# Patient Record
Sex: Female | Born: 1940
Health system: Southern US, Community
[De-identification: ages and names within clinical notes are randomized; demographics above are authoritative.]

## PROBLEM LIST (undated history)

## (undated) DIAGNOSIS — Z923 Personal history of irradiation: Secondary | ICD-10-CM

## (undated) DIAGNOSIS — M1990 Unspecified osteoarthritis, unspecified site: Secondary | ICD-10-CM

## (undated) DIAGNOSIS — Z9889 Other specified postprocedural states: Secondary | ICD-10-CM

## (undated) DIAGNOSIS — E079 Disorder of thyroid, unspecified: Secondary | ICD-10-CM

## (undated) DIAGNOSIS — R7303 Prediabetes: Secondary | ICD-10-CM

## (undated) DIAGNOSIS — E039 Hypothyroidism, unspecified: Secondary | ICD-10-CM

## (undated) DIAGNOSIS — H269 Unspecified cataract: Secondary | ICD-10-CM

## (undated) DIAGNOSIS — H9319 Tinnitus, unspecified ear: Secondary | ICD-10-CM

## (undated) DIAGNOSIS — C801 Malignant (primary) neoplasm, unspecified: Secondary | ICD-10-CM

## (undated) DIAGNOSIS — R112 Nausea with vomiting, unspecified: Secondary | ICD-10-CM

## (undated) DIAGNOSIS — Z9221 Personal history of antineoplastic chemotherapy: Secondary | ICD-10-CM

## (undated) DIAGNOSIS — T8859XA Other complications of anesthesia, initial encounter: Secondary | ICD-10-CM

## (undated) DIAGNOSIS — E785 Hyperlipidemia, unspecified: Secondary | ICD-10-CM

## (undated) DIAGNOSIS — F419 Anxiety disorder, unspecified: Secondary | ICD-10-CM

## (undated) DIAGNOSIS — G4730 Sleep apnea, unspecified: Secondary | ICD-10-CM

## (undated) HISTORY — DX: Unspecified cataract: H26.9

## (undated) HISTORY — PX: TONSILLECTOMY: SUR1361

## (undated) HISTORY — PX: OTHER SURGICAL HISTORY: SHX169

## (undated) HISTORY — DX: Malignant (primary) neoplasm, unspecified: C80.1

## (undated) HISTORY — DX: Disorder of thyroid, unspecified: E07.9

## (undated) HISTORY — DX: Hyperlipidemia, unspecified: E78.5

---

## 2003-09-29 DIAGNOSIS — C801 Malignant (primary) neoplasm, unspecified: Secondary | ICD-10-CM

## 2003-09-29 HISTORY — DX: Malignant (primary) neoplasm, unspecified: C80.1

## 2003-09-29 HISTORY — PX: BREAST BIOPSY: SHX20

## 2003-09-29 HISTORY — PX: BREAST LUMPECTOMY: SHX2

## 2003-11-15 ENCOUNTER — Other Ambulatory Visit: Payer: Self-pay

## 2004-04-03 ENCOUNTER — Ambulatory Visit (HOSPITAL_COMMUNITY): Admission: RE | Admit: 2004-04-03 | Discharge: 2004-04-03 | Payer: Self-pay | Admitting: Hematology & Oncology

## 2004-06-28 ENCOUNTER — Ambulatory Visit: Payer: Self-pay | Admitting: Internal Medicine

## 2004-07-29 ENCOUNTER — Ambulatory Visit: Payer: Self-pay | Admitting: Internal Medicine

## 2004-08-06 ENCOUNTER — Ambulatory Visit: Payer: Self-pay | Admitting: General Surgery

## 2004-08-13 ENCOUNTER — Ambulatory Visit: Payer: Self-pay | Admitting: Hematology & Oncology

## 2004-08-28 ENCOUNTER — Ambulatory Visit: Payer: Self-pay | Admitting: Internal Medicine

## 2004-09-28 ENCOUNTER — Ambulatory Visit: Payer: Self-pay | Admitting: Internal Medicine

## 2004-10-09 ENCOUNTER — Ambulatory Visit: Payer: Self-pay | Admitting: Hematology & Oncology

## 2004-10-29 ENCOUNTER — Ambulatory Visit: Payer: Self-pay | Admitting: Internal Medicine

## 2005-01-19 ENCOUNTER — Ambulatory Visit: Payer: Self-pay | Admitting: Internal Medicine

## 2005-01-22 ENCOUNTER — Ambulatory Visit: Payer: Self-pay | Admitting: Internal Medicine

## 2005-01-27 ENCOUNTER — Ambulatory Visit: Payer: Self-pay | Admitting: Hematology & Oncology

## 2005-01-28 ENCOUNTER — Ambulatory Visit: Payer: Self-pay | Admitting: Hematology & Oncology

## 2005-05-06 ENCOUNTER — Ambulatory Visit: Payer: Self-pay | Admitting: Hematology & Oncology

## 2005-07-27 ENCOUNTER — Ambulatory Visit: Payer: Self-pay | Admitting: Internal Medicine

## 2005-08-05 ENCOUNTER — Ambulatory Visit: Payer: Self-pay | Admitting: Hematology & Oncology

## 2005-09-08 ENCOUNTER — Ambulatory Visit: Payer: Self-pay | Admitting: Internal Medicine

## 2005-11-03 ENCOUNTER — Encounter: Payer: Self-pay | Admitting: Internal Medicine

## 2005-11-26 ENCOUNTER — Ambulatory Visit: Payer: Self-pay | Admitting: Hematology & Oncology

## 2005-12-28 ENCOUNTER — Ambulatory Visit: Payer: Self-pay | Admitting: General Surgery

## 2006-03-23 ENCOUNTER — Ambulatory Visit: Payer: Self-pay | Admitting: Hematology & Oncology

## 2006-03-26 LAB — CBC WITH DIFFERENTIAL/PLATELET
Eosinophils Absolute: 0.2 10*3/uL (ref 0.0–0.5)
HCT: 40.1 % (ref 34.8–46.6)
HGB: 13.8 g/dL (ref 11.6–15.9)
LYMPH%: 34.7 % (ref 14.0–48.0)
MONO#: 0.6 10*3/uL (ref 0.1–0.9)
NEUT#: 3.7 10*3/uL (ref 1.5–6.5)
NEUT%: 53.6 % (ref 39.6–76.8)
Platelets: 223 10*3/uL (ref 145–400)
WBC: 6.9 10*3/uL (ref 3.9–10.0)

## 2006-03-26 LAB — COMPREHENSIVE METABOLIC PANEL
Alkaline Phosphatase: 67 U/L (ref 39–117)
CO2: 24 mEq/L (ref 19–32)
Creatinine, Ser: 0.79 mg/dL (ref 0.40–1.20)
Glucose, Bld: 86 mg/dL (ref 70–99)
Sodium: 139 mEq/L (ref 135–145)
Total Bilirubin: 0.7 mg/dL (ref 0.3–1.2)
Total Protein: 6.9 g/dL (ref 6.0–8.3)

## 2006-06-28 ENCOUNTER — Ambulatory Visit: Payer: Self-pay | Admitting: General Surgery

## 2006-07-14 ENCOUNTER — Ambulatory Visit: Payer: Self-pay | Admitting: Hematology & Oncology

## 2007-01-05 ENCOUNTER — Ambulatory Visit: Payer: Self-pay | Admitting: General Surgery

## 2007-01-10 ENCOUNTER — Ambulatory Visit: Payer: Self-pay | Admitting: Hematology & Oncology

## 2007-01-13 LAB — COMPREHENSIVE METABOLIC PANEL
Alkaline Phosphatase: 69 U/L (ref 39–117)
BUN: 13 mg/dL (ref 6–23)
CO2: 23 mEq/L (ref 19–32)
Creatinine, Ser: 0.99 mg/dL (ref 0.40–1.20)
Glucose, Bld: 128 mg/dL — ABNORMAL HIGH (ref 70–99)
Total Bilirubin: 0.5 mg/dL (ref 0.3–1.2)
Total Protein: 6.6 g/dL (ref 6.0–8.3)

## 2007-01-13 LAB — CBC WITH DIFFERENTIAL/PLATELET
Basophils Absolute: 0 10*3/uL (ref 0.0–0.1)
Eosinophils Absolute: 0.3 10*3/uL (ref 0.0–0.5)
HCT: 38.4 % (ref 34.8–46.6)
LYMPH%: 32.5 % (ref 14.0–48.0)
MCV: 93.1 fL (ref 81.0–101.0)
MONO#: 0.5 10*3/uL (ref 0.1–0.9)
MONO%: 7.2 % (ref 0.0–13.0)
NEUT#: 3.6 10*3/uL (ref 1.5–6.5)
NEUT%: 54.7 % (ref 39.6–76.8)
Platelets: 214 10*3/uL (ref 145–400)
WBC: 6.6 10*3/uL (ref 3.9–10.0)

## 2007-07-12 ENCOUNTER — Ambulatory Visit: Payer: Self-pay | Admitting: Hematology & Oncology

## 2007-07-14 LAB — COMPREHENSIVE METABOLIC PANEL
Albumin: 4.4 g/dL (ref 3.5–5.2)
BUN: 13 mg/dL (ref 6–23)
CO2: 21 mEq/L (ref 19–32)
Glucose, Bld: 107 mg/dL — ABNORMAL HIGH (ref 70–99)
Potassium: 4.3 mEq/L (ref 3.5–5.3)
Sodium: 140 mEq/L (ref 135–145)
Total Bilirubin: 0.6 mg/dL (ref 0.3–1.2)
Total Protein: 6.9 g/dL (ref 6.0–8.3)

## 2007-07-14 LAB — CBC WITH DIFFERENTIAL/PLATELET
Basophils Absolute: 0 10*3/uL (ref 0.0–0.1)
Eosinophils Absolute: 0.2 10*3/uL (ref 0.0–0.5)
HCT: 38.4 % (ref 34.8–46.6)
HGB: 13.5 g/dL (ref 11.6–15.9)
LYMPH%: 34.1 % (ref 14.0–48.0)
MONO#: 0.5 10*3/uL (ref 0.1–0.9)
NEUT#: 3.6 10*3/uL (ref 1.5–6.5)
Platelets: 224 10*3/uL (ref 145–400)
RBC: 4.12 10*6/uL (ref 3.70–5.32)
WBC: 6.5 10*3/uL (ref 3.9–10.0)

## 2007-08-23 ENCOUNTER — Ambulatory Visit: Payer: Self-pay | Admitting: Hematology & Oncology

## 2007-11-28 ENCOUNTER — Ambulatory Visit: Payer: Self-pay | Admitting: Hematology & Oncology

## 2007-11-30 LAB — CBC WITH DIFFERENTIAL/PLATELET
BASO%: 0.5 % (ref 0.0–2.0)
HCT: 40.5 % (ref 34.8–46.6)
LYMPH%: 37.2 % (ref 14.0–48.0)
MCHC: 34.7 g/dL (ref 32.0–36.0)
MONO#: 0.6 10*3/uL (ref 0.1–0.9)
NEUT%: 50.8 % (ref 39.6–76.8)
Platelets: 238 10*3/uL (ref 145–400)
WBC: 7.1 10*3/uL (ref 3.9–10.0)

## 2007-11-30 LAB — COMPREHENSIVE METABOLIC PANEL
ALT: 16 U/L (ref 0–35)
BUN: 13 mg/dL (ref 6–23)
CO2: 21 mEq/L (ref 19–32)
Creatinine, Ser: 0.97 mg/dL (ref 0.40–1.20)
Glucose, Bld: 80 mg/dL (ref 70–99)
Total Bilirubin: 0.6 mg/dL (ref 0.3–1.2)

## 2008-01-23 ENCOUNTER — Ambulatory Visit: Payer: Self-pay | Admitting: General Surgery

## 2008-01-27 ENCOUNTER — Ambulatory Visit: Payer: Self-pay | Admitting: Radiation Oncology

## 2008-04-04 ENCOUNTER — Ambulatory Visit: Payer: Self-pay | Admitting: Hematology & Oncology

## 2008-04-04 LAB — COMPREHENSIVE METABOLIC PANEL
ALT: 13 U/L (ref 0–35)
AST: 14 U/L (ref 0–37)
CO2: 20 mEq/L (ref 19–32)
Creatinine, Ser: 0.95 mg/dL (ref 0.40–1.20)
Total Bilirubin: 0.8 mg/dL (ref 0.3–1.2)

## 2008-05-30 ENCOUNTER — Ambulatory Visit: Payer: Self-pay | Admitting: Unknown Physician Specialty

## 2008-05-30 LAB — HM COLONOSCOPY

## 2008-07-31 ENCOUNTER — Ambulatory Visit: Payer: Self-pay | Admitting: Hematology & Oncology

## 2008-08-01 LAB — CBC WITH DIFFERENTIAL (CANCER CENTER ONLY)
BASO#: 0.1 10*3/uL (ref 0.0–0.2)
BASO%: 0.9 % (ref 0.0–2.0)
EOS%: 3.5 % (ref 0.0–7.0)
HCT: 40.7 % (ref 34.8–46.6)
HGB: 13.8 g/dL (ref 11.6–15.9)
LYMPH%: 36.3 % (ref 14.0–48.0)
MCHC: 33.9 g/dL (ref 32.0–36.0)
MCV: 91 fL (ref 81–101)
MONO#: 0.4 10*3/uL (ref 0.1–0.9)
NEUT%: 54.3 % (ref 39.6–80.0)
RDW: 12.8 % (ref 10.5–14.6)

## 2008-08-01 LAB — COMPREHENSIVE METABOLIC PANEL
ALT: 14 U/L (ref 0–35)
AST: 18 U/L (ref 0–37)
Albumin: 4.5 g/dL (ref 3.5–5.2)
Calcium: 10 mg/dL (ref 8.4–10.5)
Chloride: 104 mEq/L (ref 96–112)
Potassium: 4.9 mEq/L (ref 3.5–5.3)
Sodium: 135 mEq/L (ref 135–145)
Total Protein: 7.4 g/dL (ref 6.0–8.3)

## 2008-11-28 ENCOUNTER — Ambulatory Visit: Payer: Self-pay | Admitting: Hematology & Oncology

## 2008-11-28 LAB — COMPREHENSIVE METABOLIC PANEL
ALT: 14 U/L (ref 0–35)
AST: 15 U/L (ref 0–37)
CO2: 22 mEq/L (ref 19–32)
Sodium: 138 mEq/L (ref 135–145)
Total Bilirubin: 0.7 mg/dL (ref 0.3–1.2)
Total Protein: 7.2 g/dL (ref 6.0–8.3)

## 2008-11-28 LAB — CBC WITH DIFFERENTIAL (CANCER CENTER ONLY)
Eosinophils Absolute: 0.2 10*3/uL (ref 0.0–0.5)
HCT: 43.7 % (ref 34.8–46.6)
HGB: 14.6 g/dL (ref 11.6–15.9)
LYMPH#: 2.2 10*3/uL (ref 0.9–3.3)
LYMPH%: 36.3 % (ref 14.0–48.0)
MCV: 94 fL (ref 81–101)
MONO#: 0.3 10*3/uL (ref 0.1–0.9)
NEUT%: 55.3 % (ref 39.6–80.0)
RBC: 4.66 10*6/uL (ref 3.70–5.32)
WBC: 6.2 10*3/uL (ref 3.9–10.0)

## 2008-11-28 LAB — MAGNESIUM: Magnesium: 1.8 mg/dL (ref 1.5–2.5)

## 2008-11-28 LAB — LACTATE DEHYDROGENASE: LDH: 128 U/L (ref 94–250)

## 2009-01-24 ENCOUNTER — Ambulatory Visit: Payer: Self-pay | Admitting: General Surgery

## 2009-04-03 ENCOUNTER — Ambulatory Visit: Payer: Self-pay | Admitting: Hematology & Oncology

## 2009-04-04 LAB — CBC WITH DIFFERENTIAL (CANCER CENTER ONLY)
Eosinophils Absolute: 0.3 10*3/uL (ref 0.0–0.5)
LYMPH#: 2.3 10*3/uL (ref 0.9–3.3)
MCV: 94 fL (ref 81–101)
MONO#: 0.3 10*3/uL (ref 0.1–0.9)
NEUT#: 3.1 10*3/uL (ref 1.5–6.5)
Platelets: 200 10*3/uL (ref 145–400)
RBC: 4.66 10*6/uL (ref 3.70–5.32)
WBC: 6.1 10*3/uL (ref 3.9–10.0)

## 2009-04-05 ENCOUNTER — Ambulatory Visit: Payer: Self-pay | Admitting: Genetic Counselor

## 2009-04-05 LAB — COMPREHENSIVE METABOLIC PANEL
CO2: 22 mEq/L (ref 19–32)
Calcium: 9.6 mg/dL (ref 8.4–10.5)
Creatinine, Ser: 0.97 mg/dL (ref 0.40–1.20)
Glucose, Bld: 98 mg/dL (ref 70–99)
Sodium: 140 mEq/L (ref 135–145)
Total Bilirubin: 0.7 mg/dL (ref 0.3–1.2)
Total Protein: 7.1 g/dL (ref 6.0–8.3)

## 2009-04-05 LAB — VITAMIN D 25 HYDROXY (VIT D DEFICIENCY, FRACTURES): Vit D, 25-Hydroxy: 43 ng/mL (ref 30–89)

## 2009-04-08 ENCOUNTER — Ambulatory Visit: Payer: Self-pay

## 2009-06-05 ENCOUNTER — Ambulatory Visit: Payer: Self-pay | Admitting: Genetic Counselor

## 2009-08-07 ENCOUNTER — Ambulatory Visit: Payer: Self-pay | Admitting: Hematology & Oncology

## 2009-08-08 LAB — CBC WITH DIFFERENTIAL (CANCER CENTER ONLY)
BASO%: 0.8 % (ref 0.0–2.0)
EOS%: 2.7 % (ref 0.0–7.0)
Eosinophils Absolute: 0.2 10*3/uL (ref 0.0–0.5)
MCH: 31.5 pg (ref 26.0–34.0)
MONO%: 5.7 % (ref 0.0–13.0)
NEUT#: 4.2 10*3/uL (ref 1.5–6.5)
Platelets: 199 10*3/uL (ref 145–400)
RBC: 4.74 10*6/uL (ref 3.70–5.32)
RDW: 13.7 % (ref 10.5–14.6)
WBC: 7 10*3/uL (ref 3.9–10.0)

## 2009-08-08 LAB — COMPREHENSIVE METABOLIC PANEL
ALT: 15 U/L (ref 0–35)
Albumin: 4.6 g/dL (ref 3.5–5.2)
Alkaline Phosphatase: 63 U/L (ref 39–117)
Glucose, Bld: 101 mg/dL — ABNORMAL HIGH (ref 70–99)
Potassium: 3.9 mEq/L (ref 3.5–5.3)
Sodium: 142 mEq/L (ref 135–145)
Total Protein: 7.1 g/dL (ref 6.0–8.3)

## 2009-10-09 ENCOUNTER — Ambulatory Visit: Payer: Self-pay | Admitting: Genetic Counselor

## 2009-11-27 ENCOUNTER — Ambulatory Visit: Payer: Self-pay | Admitting: Hematology & Oncology

## 2009-11-28 LAB — CBC WITH DIFFERENTIAL (CANCER CENTER ONLY)
BASO#: 0.1 10*3/uL (ref 0.0–0.2)
EOS%: 2.9 % (ref 0.0–7.0)
HGB: 14.6 g/dL (ref 11.6–15.9)
LYMPH%: 37.4 % (ref 14.0–48.0)
MCH: 31 pg (ref 26.0–34.0)
MCHC: 33 g/dL (ref 32.0–36.0)
MONO%: 5.8 % (ref 0.0–13.0)
NEUT#: 3.7 10*3/uL (ref 1.5–6.5)
NEUT%: 53.2 % (ref 39.6–80.0)

## 2009-11-28 LAB — COMPREHENSIVE METABOLIC PANEL
AST: 15 U/L (ref 0–37)
Alkaline Phosphatase: 51 U/L (ref 39–117)
BUN: 17 mg/dL (ref 6–23)
Creatinine, Ser: 0.82 mg/dL (ref 0.40–1.20)
Potassium: 4.1 mEq/L (ref 3.5–5.3)
Total Bilirubin: 0.8 mg/dL (ref 0.3–1.2)

## 2009-12-03 ENCOUNTER — Ambulatory Visit: Payer: Self-pay | Admitting: Hematology & Oncology

## 2010-01-30 ENCOUNTER — Ambulatory Visit: Payer: Self-pay | Admitting: Hematology & Oncology

## 2010-04-02 ENCOUNTER — Ambulatory Visit (HOSPITAL_BASED_OUTPATIENT_CLINIC_OR_DEPARTMENT_OTHER): Payer: Medicare Other | Admitting: Hematology & Oncology

## 2010-10-18 ENCOUNTER — Encounter: Payer: Self-pay | Admitting: Hematology & Oncology

## 2010-11-20 ENCOUNTER — Encounter (HOSPITAL_BASED_OUTPATIENT_CLINIC_OR_DEPARTMENT_OTHER): Payer: Medicare Other | Admitting: Hematology & Oncology

## 2010-11-20 DIAGNOSIS — C50419 Malignant neoplasm of upper-outer quadrant of unspecified female breast: Secondary | ICD-10-CM

## 2010-11-20 DIAGNOSIS — Z170 Estrogen receptor positive status [ER+]: Secondary | ICD-10-CM

## 2010-11-20 LAB — COMPREHENSIVE METABOLIC PANEL
Albumin: 4.7 g/dL (ref 3.5–5.2)
Alkaline Phosphatase: 51 U/L (ref 39–117)
BUN: 15 mg/dL (ref 6–23)
CO2: 23 mEq/L (ref 19–32)
Glucose, Bld: 136 mg/dL — ABNORMAL HIGH (ref 70–99)
Potassium: 4 mEq/L (ref 3.5–5.3)
Total Bilirubin: 0.8 mg/dL (ref 0.3–1.2)

## 2010-11-20 LAB — LACTATE DEHYDROGENASE: LDH: 127 U/L (ref 94–250)

## 2010-11-20 LAB — CBC WITH DIFFERENTIAL (CANCER CENTER ONLY)
BASO#: 0.1 10*3/uL (ref 0.0–0.2)
Eosinophils Absolute: 0.2 10*3/uL (ref 0.0–0.5)
HGB: 14.6 g/dL (ref 11.6–15.9)
LYMPH%: 33.3 % (ref 14.0–48.0)
MCH: 32.1 pg (ref 26.0–34.0)
MCV: 93 fL (ref 81–101)
MONO#: 0.7 10*3/uL (ref 0.1–0.9)
MONO%: 7.9 % (ref 0.0–13.0)
RBC: 4.56 10*6/uL (ref 3.70–5.32)

## 2011-02-12 ENCOUNTER — Ambulatory Visit: Payer: Self-pay | Admitting: Hematology & Oncology

## 2011-03-19 ENCOUNTER — Encounter (HOSPITAL_BASED_OUTPATIENT_CLINIC_OR_DEPARTMENT_OTHER): Payer: Medicare Other | Admitting: Hematology & Oncology

## 2011-03-19 DIAGNOSIS — N63 Unspecified lump in unspecified breast: Secondary | ICD-10-CM

## 2011-03-19 DIAGNOSIS — C50419 Malignant neoplasm of upper-outer quadrant of unspecified female breast: Secondary | ICD-10-CM

## 2011-03-25 LAB — HM MAMMOGRAPHY

## 2011-06-12 ENCOUNTER — Other Ambulatory Visit: Payer: Self-pay | Admitting: Internal Medicine

## 2011-06-12 MED ORDER — FOLIC ACID 1 MG PO TABS: 1.0000 mg | ORAL_TABLET | Freq: Every day | ORAL | Status: AC

## 2011-06-19 ENCOUNTER — Other Ambulatory Visit: Payer: Self-pay | Admitting: Hematology & Oncology

## 2011-06-19 ENCOUNTER — Encounter (HOSPITAL_BASED_OUTPATIENT_CLINIC_OR_DEPARTMENT_OTHER): Payer: Medicare Other | Admitting: Hematology & Oncology

## 2011-06-19 DIAGNOSIS — Z170 Estrogen receptor positive status [ER+]: Secondary | ICD-10-CM

## 2011-06-19 DIAGNOSIS — Z23 Encounter for immunization: Secondary | ICD-10-CM

## 2011-06-19 DIAGNOSIS — C50419 Malignant neoplasm of upper-outer quadrant of unspecified female breast: Secondary | ICD-10-CM

## 2011-06-19 LAB — CBC WITH DIFFERENTIAL (CANCER CENTER ONLY)
BASO#: 0 10*3/uL (ref 0.0–0.2)
BASO%: 0.4 % (ref 0.0–2.0)
EOS%: 3.5 % (ref 0.0–7.0)
HCT: 41.6 % (ref 34.8–46.6)
HGB: 15.1 g/dL (ref 11.6–15.9)
LYMPH#: 2.4 10*3/uL (ref 0.9–3.3)
MCH: 32 pg (ref 26.0–34.0)
MCHC: 36.3 g/dL — ABNORMAL HIGH (ref 32.0–36.0)
MONO%: 6.5 % (ref 0.0–13.0)
NEUT%: 55.8 % (ref 39.6–80.0)
RDW: 13 % (ref 11.1–15.7)

## 2011-06-19 LAB — COMPREHENSIVE METABOLIC PANEL
ALT: 16 U/L (ref 0–35)
AST: 18 U/L (ref 0–37)
Calcium: 9.7 mg/dL (ref 8.4–10.5)
Glucose, Bld: 131 mg/dL — ABNORMAL HIGH (ref 70–99)
Potassium: 4.2 mEq/L (ref 3.5–5.3)
Sodium: 140 mEq/L (ref 135–145)
Total Bilirubin: 0.6 mg/dL (ref 0.3–1.2)

## 2011-06-19 LAB — VITAMIN D 25 HYDROXY (VIT D DEFICIENCY, FRACTURES): Vit D, 25-Hydroxy: 47 ng/mL (ref 30–89)

## 2011-06-26 ENCOUNTER — Telehealth: Payer: Self-pay | Admitting: Internal Medicine

## 2011-06-26 NOTE — Telephone Encounter (Signed)
Patient called and stated she has a friend who is also a patient of yours that is being treated for a staph infection right now.  She stated that friend asked her to drive her to the airport, she wanted to know if it was safe to be around the other patient right now and if she was contagious.  Please advise.

## 2011-06-26 NOTE — Telephone Encounter (Signed)
Notified patient of the message 

## 2011-06-26 NOTE — Telephone Encounter (Signed)
I do not have access to Desera's chart, but as long as she is not currently taking chemotherapy , she is ok to drive her friend to the airport.  I still would not drink or share any food with her

## 2011-07-14 ENCOUNTER — Ambulatory Visit: Payer: Medicare Other | Admitting: Internal Medicine

## 2011-07-23 ENCOUNTER — Ambulatory Visit (INDEPENDENT_AMBULATORY_CARE_PROVIDER_SITE_OTHER): Payer: Medicare Other | Admitting: Internal Medicine

## 2011-07-23 ENCOUNTER — Encounter: Payer: Self-pay | Admitting: Internal Medicine

## 2011-07-23 DIAGNOSIS — E039 Hypothyroidism, unspecified: Secondary | ICD-10-CM

## 2011-07-23 DIAGNOSIS — E1169 Type 2 diabetes mellitus with other specified complication: Secondary | ICD-10-CM | POA: Insufficient documentation

## 2011-07-23 DIAGNOSIS — E119 Type 2 diabetes mellitus without complications: Secondary | ICD-10-CM

## 2011-07-23 DIAGNOSIS — E785 Hyperlipidemia, unspecified: Secondary | ICD-10-CM

## 2011-07-23 LAB — HEMOGLOBIN A1C: Hgb A1c MFr Bld: 6.5 % (ref 4.6–6.5)

## 2011-07-23 LAB — LDL CHOLESTEROL, DIRECT: Direct LDL: 200.2 mg/dL

## 2011-07-23 NOTE — Progress Notes (Signed)
  Subjective:    Patient ID: Jacqueline Mata, female    DOB: 02/20/1941, 70 y.o.   MRN: [id number]  HPI Jacqueline Mata is a 70 yr old white female with a histroy of breast cancer, diet controlled diabetes, mild hyperlipidemia and obesity who presents for followup on diabetes.  She has been reducing her carbohydrates  And consuming more fruits and vegetables and lean meats. She is walking several times a week but not engaging in any aerobic exercise due to bilateral knee/joint pain Past Medical History  Diagnosis Date  . Diabetes mellitus   . Hyperlipidemia   . hypothyroidism   . breast cancer     Current Outpatient Prescriptions on File Prior to Visit  Medication Sig Dispense Refill  . folic acid (FOLVITE) 1 MG tablet Take 1 tablet (1 mg total) by mouth daily.  90 tablet  3    Review of Systems  Constitutional: Negative for fever, chills and unexpected weight change.  HENT: Negative for hearing loss, ear pain, nosebleeds, congestion, sore throat, facial swelling, rhinorrhea, sneezing, mouth sores, trouble swallowing, neck pain, neck stiffness, voice change, postnasal drip, sinus pressure, tinnitus and ear discharge.   Eyes: Negative for pain, discharge, redness and visual disturbance.  Respiratory: Negative for cough, chest tightness, shortness of breath, wheezing and stridor.   Cardiovascular: Negative for chest pain, palpitations and leg swelling.  Musculoskeletal: Negative for myalgias and arthralgias.  Skin: Negative for color change and rash.  Neurological: Negative for dizziness, weakness, light-headedness and headaches.  Hematological: Negative for adenopathy.       BP 116/74  Pulse 94  Resp 16  Ht 5' 4.5" (1.638 m)  Wt 176 lb 4 oz (79.946 kg)  BMI 29.79 kg/m2  SpO2 96%  Objective:   Physical Exam  Constitutional: She is oriented to person, place, and time. She appears well-developed and well-nourished.  HENT:  Mouth/Throat: Oropharynx is clear and moist.  Eyes: EOM are  normal. Pupils are equal, round, and reactive to light. No scleral icterus.  Neck: Normal range of motion. Neck supple. No JVD present. No thyromegaly present.  Cardiovascular: Normal rate, regular rhythm, normal heart sounds and intact distal pulses.   Pulmonary/Chest: Effort normal and breath sounds normal.  Abdominal: Soft. Bowel sounds are normal. She exhibits no mass. There is no tenderness.  Musculoskeletal: Normal range of motion. She exhibits no edema.  Lymphadenopathy:    She has no cervical adenopathy.  Neurological: She is alert and oriented to person, place, and time.  Skin: Skin is warm and dry.  Psychiatric: She has a normal mood and affect.      Assessment & Plan:

## 2011-07-23 NOTE — Patient Instructions (Signed)
Try taking  glucosamine /.chonrotind sulfate 1000 mg twice daily for a 3 month trial   Try the low carb pita bread and flat bread by [employer]

## 2011-07-23 NOTE — Assessment & Plan Note (Addendum)
Her Last a1c was 6.5 in September.  She has been folowing a carbohydrate restricted diet and walking.  No medications required

## 2011-07-26 ENCOUNTER — Encounter: Payer: Self-pay | Admitting: Internal Medicine

## 2011-07-26 DIAGNOSIS — E039 Hypothyroidism, unspecified: Secondary | ICD-10-CM | POA: Insufficient documentation

## 2011-07-26 DIAGNOSIS — E785 Hyperlipidemia, unspecified: Secondary | ICD-10-CM | POA: Insufficient documentation

## 2011-07-26 NOTE — Assessment & Plan Note (Addendum)
Her untreated LDL is 200.  Will recommend statin therapy with pravastatin.

## 2011-07-26 NOTE — Assessment & Plan Note (Signed)
Her TSH is in excellent therapeutic range on current Synthroid dose.

## 2011-07-27 ENCOUNTER — Encounter: Payer: Self-pay | Admitting: Internal Medicine

## 2011-08-18 ENCOUNTER — Other Ambulatory Visit: Payer: Self-pay | Admitting: *Deleted

## 2011-08-18 DIAGNOSIS — C50419 Malignant neoplasm of upper-outer quadrant of unspecified female breast: Secondary | ICD-10-CM

## 2011-09-29 HISTORY — PX: JOINT REPLACEMENT: SHX530

## 2011-10-28 ENCOUNTER — Ambulatory Visit: Payer: Medicare Other | Admitting: Internal Medicine

## 2011-11-02 ENCOUNTER — Ambulatory Visit (INDEPENDENT_AMBULATORY_CARE_PROVIDER_SITE_OTHER)
Admission: RE | Admit: 2011-11-02 | Discharge: 2011-11-02 | Disposition: A | Discharge status: Home / Self Care | Payer: Medicare Other | Source: Ambulatory Visit | Attending: Internal Medicine | Admitting: Internal Medicine

## 2011-11-02 ENCOUNTER — Encounter: Payer: Self-pay | Admitting: Internal Medicine

## 2011-11-02 ENCOUNTER — Ambulatory Visit (INDEPENDENT_AMBULATORY_CARE_PROVIDER_SITE_OTHER): Payer: Medicare Other | Admitting: Internal Medicine

## 2011-11-02 DIAGNOSIS — E039 Hypothyroidism, unspecified: Secondary | ICD-10-CM

## 2011-11-02 DIAGNOSIS — E119 Type 2 diabetes mellitus without complications: Secondary | ICD-10-CM

## 2011-11-02 DIAGNOSIS — R0602 Shortness of breath: Secondary | ICD-10-CM

## 2011-11-02 DIAGNOSIS — R05 Cough: Secondary | ICD-10-CM | POA: Insufficient documentation

## 2011-11-02 MED ORDER — AZELASTINE HCL 0.1 % NA SOLN: 1.0000 | Freq: Two times a day (BID) | NASAL | Status: DC

## 2011-11-02 NOTE — Assessment & Plan Note (Signed)
Managed with low carbohydrate diet with better control.Hgba1c is due,  She has had her diabetic eye exam as she has had 2 vitrous detachments.

## 2011-11-02 NOTE — Progress Notes (Signed)
Subjective:    Patient ID: Jacqueline Mata, female    DOB: [birth year], 71 y.o.   MRN: [id number]  HPI  71 yr old white female with history of breast cancer, strong family history of lung cancer in nonsmokers, presents for diabetes followup,  Controlled with low carb diet.  fastings have been 129  To 135,  Post prandials are always < 130.  Had an episode of epistaxis several weeks ago after picking nose,  Was quite effusive.  Bleeding stopped after finally expelling a clot and packing it for an hour.   Not exercising secondary to severe left knee pain.,  priro right knee replacement, 2001.  Has had a nonproductive cough intermittently for the last 3 months. No fevers,  Weight loss , headaches,  Past Medical History  Diagnosis Date  . Diabetes mellitus   . Hyperlipidemia   . hypothyroidism   . breast cancer    .mded Current Outpatient Prescriptions on File Prior to Visit  Medication Sig Dispense Refill  . aspirin 81 MG tablet Take 81 mg by mouth daily.        Marland Kitchen exemestane (AROMASIN) 25 MG tablet Take 1 tablet (25 mg total) by mouth daily after breakfast.  30 tablet  3  . folic acid (FOLVITE) 1 MG tablet Take 1 tablet (1 mg total) by mouth daily.  90 tablet  3  . levothyroxine (SYNTHROID, LEVOTHROID) 100 MCG tablet Take 100 mcg by mouth daily.        Marland Kitchen levothyroxine (SYNTHROID, LEVOTHROID) 88 MCG tablet Take 88 mcg by mouth daily.        . calcium carbonate (OS-CAL) 600 MG TABS Take 600 mg by mouth 2 (two) times daily with a meal.        . cholecalciferol (VITAMIN D) 1000 UNITS tablet Take 1,000 Units by mouth daily.            Review of Systems  Constitutional: Negative for fever, chills and unexpected weight change.  HENT: Negative for hearing loss, ear pain, nosebleeds, congestion, sore throat, facial swelling, rhinorrhea, sneezing, mouth sores, trouble swallowing, neck pain, neck stiffness, voice change, postnasal drip, sinus pressure, tinnitus and ear discharge.   Eyes: Negative for  pain, discharge, redness and visual disturbance.  Respiratory: Negative for cough, chest tightness, shortness of breath, wheezing and stridor.   Cardiovascular: Negative for chest pain, palpitations and leg swelling.  Musculoskeletal: Negative for myalgias and arthralgias.  Skin: Negative for color change and rash.  Neurological: Negative for dizziness, weakness, light-headedness and headaches.  Hematological: Negative for adenopathy.       Objective:   Physical Exam  Constitutional: She is oriented to person, place, and time. She appears well-developed and well-nourished.  HENT:  Mouth/Throat: Oropharynx is clear and moist.  Eyes: EOM are normal. Pupils are equal, round, and reactive to light. No scleral icterus.  Neck: Normal range of motion. Neck supple. No JVD present. No thyromegaly present.  Cardiovascular: Normal rate, regular rhythm, normal heart sounds and intact distal pulses.   Pulmonary/Chest: Effort normal and breath sounds normal.  Abdominal: Soft. Bowel sounds are normal. She exhibits no mass. There is no tenderness.  Musculoskeletal: Normal range of motion. She exhibits no edema.  Lymphadenopathy:    She has no cervical adenopathy.  Neurological: She is alert and oriented to person, place, and time.  Skin: Skin is warm and dry.  Psychiatric: She has a normal mood and affect.          Assessment & Plan:  Diabetes mellitus Managed with low carbohydrate diet with better control.Hgba1c is due,  She has had her diabetic eye exam as she has had 2 vitrous detachments.   Cough She has had a perstent cough and shortness of breath for the last 3 months,  Plain x ray ordered  Will need Chest CT to rule out metastatic brian or lung CA     Updated Medication List Outpatient Encounter Prescriptions as of 11/02/2011  Medication Sig Dispense Refill  . aspirin 81 MG tablet Take 81 mg by mouth daily.        Marland Kitchen exemestane (AROMASIN) 25 MG tablet Take 1 tablet (25 mg total) by  mouth daily after breakfast.  30 tablet  3  . folic acid (FOLVITE) 1 MG tablet Take 1 tablet (1 mg total) by mouth daily.  90 tablet  3  . levothyroxine (SYNTHROID, LEVOTHROID) 100 MCG tablet Take 100 mcg by mouth daily.        Marland Kitchen levothyroxine (SYNTHROID, LEVOTHROID) 88 MCG tablet Take 88 mcg by mouth daily.        Marland Kitchen DISCONTD: fluticasone (FLONASE) 50 MCG/ACT nasal spray Place 2 sprays into the nose daily.        Marland Kitchen azelastine (ASTELIN) 137 MCG/SPRAY nasal spray Place 1 spray into the nose 2 (two) times daily. Use in each nostril as directed  30 mL  12  . calcium carbonate (OS-CAL) 600 MG TABS Take 600 mg by mouth 2 (two) times daily with a meal.        . cholecalciferol (VITAMIN D) 1000 UNITS tablet Take 1,000 Units by mouth daily.

## 2011-11-02 NOTE — Patient Instructions (Addendum)
Try using Simply Saline nasal spray  twice daily followed by a little vaseline applied with q tip, to keep nasal passages moist.    Suspend your flonase, which is a steroid nasal spray and use azelastine nasal spray instead for allergies.   Keep Afrin on hand for your next nosebleed (it is a potent vasoconstrictor)    Your chest x ray is being ordered at the [facility] location.

## 2011-11-02 NOTE — Assessment & Plan Note (Signed)
She has had a perstent cough and shortness of breath for the last 3 months,  Plain x ray ordered  Will need Chest CT to rule out metastatic brian or lung CA

## 2011-11-03 ENCOUNTER — Other Ambulatory Visit (INDEPENDENT_AMBULATORY_CARE_PROVIDER_SITE_OTHER): Payer: Medicare Other | Admitting: *Deleted

## 2011-11-03 DIAGNOSIS — E119 Type 2 diabetes mellitus without complications: Secondary | ICD-10-CM

## 2011-11-03 DIAGNOSIS — E039 Hypothyroidism, unspecified: Secondary | ICD-10-CM

## 2011-11-03 LAB — COMPREHENSIVE METABOLIC PANEL
AST: 19 U/L (ref 0–37)
BUN: 18 mg/dL (ref 6–23)
CO2: 20 mEq/L (ref 19–32)
Calcium: 9.6 mg/dL (ref 8.4–10.5)
Chloride: 108 mEq/L (ref 96–112)
Creatinine, Ser: 0.9 mg/dL (ref 0.4–1.2)
GFR: 64.17 mL/min (ref >60.00)

## 2011-11-03 LAB — LIPID PANEL
HDL: 45.8 mg/dL (ref >39.00)
Triglycerides: 70 mg/dL (ref 0.0–149.0)

## 2011-11-03 NOTE — Progress Notes (Signed)
Addended by: Melody Comas L on: 11/03/2011 09:30 AM   Modules accepted: Orders

## 2011-11-06 ENCOUNTER — Encounter: Payer: Self-pay | Admitting: Internal Medicine

## 2011-11-06 ENCOUNTER — Telehealth: Payer: Self-pay | Admitting: *Deleted

## 2011-11-06 DIAGNOSIS — E785 Hyperlipidemia, unspecified: Secondary | ICD-10-CM

## 2011-11-06 NOTE — Assessment & Plan Note (Signed)
She has not tolerated statins in the past due to the interaction with aromasin which causes her to have severe arthralgias.

## 2011-11-06 NOTE — Telephone Encounter (Signed)
Ok,  The aromasin is much more important.

## 2011-11-06 NOTE — Telephone Encounter (Signed)
Advised pt

## 2011-11-06 NOTE — Telephone Encounter (Signed)
error 

## 2011-11-06 NOTE — Telephone Encounter (Signed)
Advised pt of lab result, high LDL and the need to take statin.  She says she cant take statins because she is taking aromasin and the combination of the two causes her joints to ache.

## 2011-11-09 ENCOUNTER — Other Ambulatory Visit: Payer: Self-pay | Admitting: Internal Medicine

## 2011-11-09 ENCOUNTER — Encounter: Payer: Self-pay | Admitting: Internal Medicine

## 2011-11-09 DIAGNOSIS — Z853 Personal history of malignant neoplasm of breast: Secondary | ICD-10-CM | POA: Insufficient documentation

## 2011-11-09 DIAGNOSIS — D0590 Unspecified type of carcinoma in situ of unspecified breast: Secondary | ICD-10-CM | POA: Insufficient documentation

## 2011-11-09 DIAGNOSIS — C50919 Malignant neoplasm of unspecified site of unspecified female breast: Secondary | ICD-10-CM

## 2011-11-10 ENCOUNTER — Encounter: Payer: Self-pay | Admitting: Internal Medicine

## 2011-11-11 ENCOUNTER — Encounter: Payer: Self-pay | Admitting: Internal Medicine

## 2011-11-11 ENCOUNTER — Telehealth: Payer: Self-pay | Admitting: *Deleted

## 2011-11-11 NOTE — Telephone Encounter (Signed)
Opened in error

## 2011-12-15 ENCOUNTER — Telehealth: Payer: Self-pay | Admitting: Hematology & Oncology

## 2011-12-15 ENCOUNTER — Other Ambulatory Visit: Payer: Self-pay | Admitting: Hematology & Oncology

## 2011-12-15 NOTE — Telephone Encounter (Signed)
Pt moved 4-4 to 4-11 °

## 2011-12-16 ENCOUNTER — Other Ambulatory Visit: Payer: Medicare Other | Admitting: Lab

## 2011-12-16 ENCOUNTER — Ambulatory Visit: Payer: Self-pay | Admitting: Hematology & Oncology

## 2011-12-16 ENCOUNTER — Ambulatory Visit: Payer: Medicare Other | Admitting: Hematology & Oncology

## 2011-12-16 ENCOUNTER — Other Ambulatory Visit: Payer: Self-pay | Admitting: Lab

## 2011-12-22 ENCOUNTER — Encounter: Payer: Self-pay | Admitting: Internal Medicine

## 2011-12-31 ENCOUNTER — Other Ambulatory Visit: Payer: Self-pay | Admitting: Lab

## 2011-12-31 ENCOUNTER — Ambulatory Visit: Payer: Self-pay | Admitting: Hematology & Oncology

## 2012-01-06 ENCOUNTER — Ambulatory Visit (HOSPITAL_BASED_OUTPATIENT_CLINIC_OR_DEPARTMENT_OTHER): Payer: Medicare Other | Admitting: Hematology & Oncology

## 2012-01-06 ENCOUNTER — Other Ambulatory Visit: Payer: Medicare Other | Admitting: Lab

## 2012-01-06 VITALS — BP 124/81 | HR 81 | Temp 97.5°F | Ht 64.0 in | Wt 178.0 lb

## 2012-01-06 DIAGNOSIS — E559 Vitamin D deficiency, unspecified: Secondary | ICD-10-CM | POA: Diagnosis not present

## 2012-01-06 DIAGNOSIS — C50419 Malignant neoplasm of upper-outer quadrant of unspecified female breast: Secondary | ICD-10-CM | POA: Diagnosis not present

## 2012-01-06 DIAGNOSIS — N63 Unspecified lump in unspecified breast: Secondary | ICD-10-CM | POA: Diagnosis not present

## 2012-01-06 DIAGNOSIS — Z170 Estrogen receptor positive status [ER+]: Secondary | ICD-10-CM | POA: Diagnosis not present

## 2012-01-06 DIAGNOSIS — M810 Age-related osteoporosis without current pathological fracture: Secondary | ICD-10-CM

## 2012-01-06 DIAGNOSIS — C50919 Malignant neoplasm of unspecified site of unspecified female breast: Secondary | ICD-10-CM

## 2012-01-06 DIAGNOSIS — Z853 Personal history of malignant neoplasm of breast: Secondary | ICD-10-CM

## 2012-01-06 LAB — CBC WITH DIFFERENTIAL (CANCER CENTER ONLY)
BASO#: 0.1 10*3/uL (ref 0.0–0.2)
HCT: 42.4 % (ref 34.8–46.6)
HGB: 14.7 g/dL (ref 11.6–15.9)
LYMPH#: 3 10*3/uL (ref 0.9–3.3)
MCHC: 34.7 g/dL (ref 32.0–36.0)
MCV: 90 fL (ref 81–101)
MONO#: 0.7 10*3/uL (ref 0.1–0.9)
NEUT%: 59.1 % (ref 39.6–80.0)
WBC: 9.9 10*3/uL (ref 3.9–10.0)

## 2012-01-06 NOTE — Progress Notes (Signed)
This office note has been dictated.

## 2012-01-07 ENCOUNTER — Other Ambulatory Visit: Payer: Self-pay | Admitting: Lab

## 2012-01-07 ENCOUNTER — Ambulatory Visit: Payer: Self-pay | Admitting: Hematology & Oncology

## 2012-01-07 LAB — VITAMIN D 25 HYDROXY (VIT D DEFICIENCY, FRACTURES): Vit D, 25-Hydroxy: 45 ng/mL (ref 30–89)

## 2012-01-07 LAB — COMPREHENSIVE METABOLIC PANEL
ALT: 14 U/L (ref 0–35)
CO2: 24 mEq/L (ref 19–32)
Creatinine, Ser: 0.95 mg/dL (ref 0.50–1.10)
Total Bilirubin: 0.7 mg/dL (ref 0.3–1.2)

## 2012-01-07 NOTE — Progress Notes (Signed)
DIAGNOSIS:  Bilateral stage IIA (T2 N0 M0) ductal carcinoma of the breasts.  CURRENT THERAPY:  Aromasin 25 mg p.o. daily (prophylactic indication).  INTERIM HISTORY:  Jacqueline Mata comes in for followup.  She is really doing well.  We last saw her back in September.  She had her birthday last weekend.  She had a good time on her birthday.  She has had no problems with pain, although she is going to need to have, I think, her left knee replaced.  She is going to have this done in [place] in August.  A Dr. Ernest Pine is going to be the surgeon.  She has had no cough.  She has had no fever.  There has been no change in bowel or bladder habits.  There has been some occasional swelling in the left knee.  She has had her right knee replaced several years ago.  She has had no issues with the headache.  There have been no rashes.  PHYSICAL EXAMINATION:  This is a well-developed, well-nourished white female in no obvious distress.  Vital signs:  97.5, pulse 81, respiratory rate 18, blood pressure 124/81.  Weight is 178.  Head and neck exam shows a normocephalic, atraumatic skull.  There are no ocular or oral lesions.  There are no palpable cervical or supraclavicular lymph nodes.  Lungs:  Clear to percussion and auscultation bilaterally. Cardiac:  Regular rate and rhythm with a normal S1 and S2.  There are no murmurs, rubs or bruits.  Breast exam shows right breast with a well- healed lumpectomy at the 4 o'clock position.  There is some slight contraction of the right breast.  There is no obvious mass in the right breast.  There is no right axillary adenopathy.  Left breast shows well- healed lumpectomy at the 9 o'clock position.  There is some slight firmness at the lumpectomy site.  No distinct mass is noted in the left breast.  There is no left axillary adenopathy.  Abdomen:  Soft with good bowel sounds.  There is no fluid wave.  There is no palpable hepatosplenomegaly.  Back:  No  tenderness over the spine, ribs, or hips. She does have some crepitus in the left knee with range of motion.  Skin exam shows some keratoses on her back.  Her skin is slightly dry.  LABORATORY STUDIES:  White cell count is 9.9, hemoglobin 14.7, hematocrit 42.4, platelet count 195.  IMPRESSION:  Jacqueline Mata is a 71 year old white female with a history of bilateral stage IIA, node negative ductal carcinomas.  She was diagnosed about 8 years ago.  She underwent adjuvant chemotherapy.  She go5t Adriamycin-based chemotherapy.  She then got radiation therapy. Her tumor was estrogen receptor-positive, so she was placed on hormonal intervention.  I have her on Aromasin now, which is more of a prophylactic-type intervention.  She completed her treatments back in November 2005.  I see no evidence of recurrent disease.  I see no evidence or reason to do any scans on her.  We will go ahead and plan to get her back to see [place] in another 6 months.    ______________________________ Josph Macho, M.D. PRE/MEDQ  D:  01/06/2012  T:  01/07/2012  Job:  [id number]

## 2012-01-11 ENCOUNTER — Other Ambulatory Visit: Payer: Self-pay | Admitting: Internal Medicine

## 2012-01-11 MED ORDER — LEVOTHYROXINE SODIUM 88 MCG PO TABS: 88.0000 ug | ORAL_TABLET | Freq: Every day | ORAL | Status: DC

## 2012-01-13 ENCOUNTER — Other Ambulatory Visit: Payer: Self-pay | Admitting: Internal Medicine

## 2012-01-13 MED ORDER — LEVOTHYROXINE SODIUM 100 MCG PO TABS: 100.0000 ug | ORAL_TABLET | Freq: Every day | ORAL | Status: DC

## 2012-01-14 ENCOUNTER — Telehealth: Payer: Self-pay | Admitting: Internal Medicine

## 2012-01-14 NOTE — Telephone Encounter (Signed)
Yes, add 15 minutes to the visit

## 2012-01-14 NOTE — Telephone Encounter (Signed)
Patient called and stated she is having a total knee replacement by Dr. Ernest Pine.  She stated it has not been scheduled yet but she is wanting to have it done at the beginning of August.  She has an appt with Dr. Ernest Pine 5/2 and he wants you to clear her for the surgery.  She has a physical scheduled with you on 5/17 and wanted to know if you could clear her for the surgery then.  If that is ok, I will add extra time to her appt.  Please advise.

## 2012-01-15 ENCOUNTER — Telehealth: Payer: Self-pay | Admitting: *Deleted

## 2012-01-15 NOTE — Telephone Encounter (Addendum)
Message copied by Mirian Capuchin on Fri Jan 15, 2012  5:17 PM ------      Message from: Arlan Organ R      Created: Thu Jan 14, 2012 10:54 AM       Cal - labs and vit D are ok.  Cindee Lame This message given to pt.  Voiced understanding.

## 2012-01-15 NOTE — Telephone Encounter (Signed)
Patient called back and stated she can't do the medical clearance then, it has to be 30 days before the surgery.  She will make the appt once her surgery is scheduled.

## 2012-01-20 ENCOUNTER — Telehealth: Payer: Self-pay | Admitting: *Deleted

## 2012-01-20 NOTE — Telephone Encounter (Signed)
Called patient to let her know that her labs and vitamin d levels were good per dr. Myna Hidalgo

## 2012-01-20 NOTE — Telephone Encounter (Signed)
Message copied by Anselm Jungling on Wed Jan 20, 2012  2:53 PM ------      Message from: Arlan Organ R      Created: Thu Jan 14, 2012 10:54 AM       Cal - labs and vit D are ok.  pete

## 2012-02-04 ENCOUNTER — Telehealth: Payer: Self-pay | Admitting: *Deleted

## 2012-02-04 NOTE — Telephone Encounter (Signed)
Received call from patient asking about whether her mammogram had been set up with [facility] in [place].  2 calls had previously been made to them without return calls.  I called [facility] and talked to a nurse who clarified that because patient is a breast cancer patient that the mammogram referral needs to be initiated by [place] as a diagnostic bilat mammogram.  Order obtained from Dr. Lupita Leash and faxed to [facility] at [phone]

## 2012-02-06 LAB — HM PAP SMEAR: HM Pap smear: NEGATIVE

## 2012-02-11 ENCOUNTER — Encounter: Payer: Medicare Other | Admitting: Internal Medicine

## 2012-02-12 ENCOUNTER — Encounter: Payer: Self-pay | Admitting: Internal Medicine

## 2012-02-12 ENCOUNTER — Ambulatory Visit (INDEPENDENT_AMBULATORY_CARE_PROVIDER_SITE_OTHER): Payer: Medicare Other | Admitting: Internal Medicine

## 2012-02-12 DIAGNOSIS — N390 Urinary tract infection, site not specified: Secondary | ICD-10-CM

## 2012-02-12 DIAGNOSIS — E785 Hyperlipidemia, unspecified: Secondary | ICD-10-CM

## 2012-02-12 DIAGNOSIS — Z23 Encounter for immunization: Secondary | ICD-10-CM

## 2012-02-12 DIAGNOSIS — E119 Type 2 diabetes mellitus without complications: Secondary | ICD-10-CM | POA: Diagnosis not present

## 2012-02-12 DIAGNOSIS — Z0000 Encounter for general adult medical examination without abnormal findings: Secondary | ICD-10-CM

## 2012-02-12 DIAGNOSIS — Z124 Encounter for screening for malignant neoplasm of cervix: Secondary | ICD-10-CM

## 2012-02-12 DIAGNOSIS — Z01818 Encounter for other preprocedural examination: Secondary | ICD-10-CM

## 2012-02-12 DIAGNOSIS — C50919 Malignant neoplasm of unspecified site of unspecified female breast: Secondary | ICD-10-CM

## 2012-02-12 LAB — POCT URINALYSIS DIPSTICK
Bilirubin, UA: NEGATIVE
Nitrite, UA: NEGATIVE
Protein, UA: NEGATIVE
Urobilinogen, UA: 0.2
pH, UA: 5.5

## 2012-02-12 LAB — HEMOGLOBIN A1C: Hgb A1c MFr Bld: 6.5 % — ABNORMAL HIGH (ref <5.7)

## 2012-02-12 LAB — VITAMIN B12: Vitamin B-12: 620 pg/mL (ref 211–911)

## 2012-02-12 NOTE — Patient Instructions (Addendum)
[facility]  It will be free there.  Wiat until after your knee replacement.   We repeated your Pneumonia vaccine today  We will call you with the results of your hgba1c, or you can sign up for MyChart and get the results via e mail

## 2012-02-14 ENCOUNTER — Encounter: Payer: Self-pay | Admitting: Internal Medicine

## 2012-02-14 DIAGNOSIS — Z01818 Encounter for other preprocedural examination: Secondary | ICD-10-CM | POA: Insufficient documentation

## 2012-02-14 LAB — URINE CULTURE

## 2012-02-14 NOTE — Assessment & Plan Note (Signed)
She has had no recent history of chest pain. She has normal renal function. Although she has diabetes that is diet controlled. She is considered low-risk for perioperative events. Baseline EKG was done today and showed normal sinus rhythm.

## 2012-02-14 NOTE — Assessment & Plan Note (Signed)
In the interim oncologist with Aromasin prior lumpectomy and chemotherapy.

## 2012-02-14 NOTE — Progress Notes (Signed)
Patient ID: Jacqueline Mata, female   DOB: 1940-09-29, 71 y.o.   MRN: [id number] The patient is here for annual Medicare wellness examination, management of other chronic and acute problems. And preoperative evaluation.   The risk factors are reflected in the social history.  The roster of all physicians providing medical care to patient - is listed in the Snapshot section of the chart.  Activities of daily living:  The patient is 100% independent in all ADLs: dressing, toileting, feeding as well as independent mobility  Home safety : The patient has smoke detectors in the home. They wear seatbelts.  There are no firearms at home. There is no violence in the home.   There is no risks for hepatitis, STDs or HIV. There is no   history of blood transfusion. They have no travel history to infectious disease endemic areas of the world.  The patient has seen their dentist in the last six month. They have seen their eye doctor in the last year. They admit to slight hearing difficulty with regard to whispered voices and some television programs.  They have deferred audiologic testing in the last year.  They do not  have excessive sun exposure. Discussed the need for sun protection: hats, long sleeves and use of sunscreen if there is significant sun exposure.   Diet: the importance of a healthy diet is discussed. They do have a healthy diet.  The benefits of regular aerobic exercise were discussed. She walks 4 times per week ,  20 minutes.   Depression screen: there are no signs or vegative symptoms of depression- irritability, change in appetite, anhedonia, sadness/tearfullness.  Cognitive assessment: the patient manages all their financial and personal affairs and is actively engaged. They could relate day,date,year and events; recalled 2/3 objects at 3 minutes; performed clock-face test normally.  The following portions of the patient's history were reviewed and updated as appropriate: allergies,  current medications, past family history, past medical history,  past surgical history, past social history  and problem list.  Visual acuity was not assessed per patient preference since she has regular follow up with her ophthalmologist. Hearing and body mass index were assessed and reviewed.   During the course of the visit the patient was educated and counseled about appropriate screening and preventive services including : fall prevention , diabetes screening, nutrition counseling, colorectal cancer screening, and recommended immunizations.    BP 122/66  Pulse 80  Temp(Src) 98.1 F (36.7 C) (Oral)  Resp 16  Ht 5' 4.5" (1.638 m)  Wt 176 lb (79.833 kg)  BMI 29.74 kg/m2  SpO2 96%  BP 122/66  Pulse 80  Temp(Src) 98.1 F (36.7 C) (Oral)  Resp 16  Ht 5' 4.5" (1.638 m)  Wt 176 lb (79.833 kg)  BMI 29.74 kg/m2  SpO2 96%  General Appearance:    Alert, cooperative, no distress, appears stated age  Head:    Normocephalic, without obvious abnormality, atraumatic  Eyes:    PERRL, conjunctiva/corneas clear, EOM's intact, fundi    benign, both eyes  Ears:    Normal TM's and external ear canals, both ears  Nose:   Nares normal, septum midline, mucosa normal, no drainage    or sinus tenderness  Throat:   Lips, mucosa, and tongue normal; teeth and gums normal  Neck:   Supple, symmetrical, trachea midline, no adenopathy;    thyroid:  no enlargement/tenderness/nodules; no carotid   bruit or JVD  Back:     Symmetric, no curvature, ROM normal,  no CVA tenderness  Lungs:     Clear to auscultation bilaterally, respirations unlabored  Chest Wall:    No tenderness or deformity   Heart:    Regular rate and rhythm, S1 and S2 normal, no murmur, rub   or gallop  Breast Exam:    No tenderness, masses, or nipple abnormality  Abdomen:     Soft, non-tender, bowel sounds active all four quadrants,    no masses, no organomegaly  Genitalia:    Normal female without lesion, discharge or tenderness    Rectal:    Normal tone, normal prostate, no masses or tenderness;   guaiac negative stool  Extremities:   Extremities normal, atraumatic, no cyanosis or edema  Pulses:   2+ and symmetric all extremities  Skin:   Skin color, texture, turgor normal, no rashes or lesions  Lymph nodes:   Cervical, supraclavicular, and axillary nodes normal  Neurologic:   CNII-XII intact, normal strength, sensation and reflexes    Throughout  Assessment and Plan  Breast cancer In the interim oncologist with Aromasin prior lumpectomy and chemotherapy.  Diabetes mellitus Well-controlled on diet alone hemoglobin A1c was   Hyperlipidemia Marked LDL elevation at 217 was noted at last check. She has a history of severe myalgias and arthralgias with prior stent therapy.  Preoperative clearance She has had no recent history of chest pain. She has normal renal function. Although she has diabetes that is diet controlled. She is considered low-risk for perioperative events. Baseline EKG was done today and showed normal sinus rhythm.    Updated Medication List Outpatient Encounter Prescriptions as of 02/12/2012  Medication Sig Dispense Refill  . aspirin 81 MG tablet Take 81 mg by mouth daily.        Marland Kitchen azelastine (ASTELIN) 137 MCG/SPRAY nasal spray Place 1 spray into the nose 2 (two) times daily. Use in each nostril as directed  30 mL  12  . cholecalciferol (VITAMIN D) 1000 UNITS tablet Take 1,000 Units by mouth daily.        Marland Kitchen exemestane (AROMASIN) 25 MG tablet TAKE 1 TABLET BY MOUTH DAILY  30 tablet  3  . fluocinonide cream (LIDEX) 0.05 % Apply topically as needed.      . folic acid (FOLVITE) 1 MG tablet Take 1 tablet (1 mg total) by mouth daily.  90 tablet  3  . levothyroxine (SYNTHROID, LEVOTHROID) 100 MCG tablet Take 1 tablet (100 mcg total) by mouth daily.  30 tablet  4  . levothyroxine (SYNTHROID, LEVOTHROID) 88 MCG tablet Take 1 tablet (88 mcg total) by mouth daily.  30 tablet  6  . DISCONTD: calcium  carbonate (OS-CAL) 600 MG TABS Take 600 mg by mouth 2 (two) times daily with a meal.

## 2012-02-14 NOTE — Assessment & Plan Note (Signed)
Marked LDL elevation at 217 was noted at last check. She has a history of severe myalgias and arthralgias with prior stent therapy.

## 2012-02-14 NOTE — Assessment & Plan Note (Signed)
Well-controlled on diet alone hemoglobin A1c was

## 2012-02-15 ENCOUNTER — Other Ambulatory Visit (HOSPITAL_COMMUNITY)
Admission: RE | Admit: 2012-02-15 | Discharge: 2012-02-15 | Disposition: A | Discharge status: Home / Self Care | Payer: Medicare Other | Source: Ambulatory Visit | Attending: Internal Medicine | Admitting: Internal Medicine

## 2012-02-15 DIAGNOSIS — Z1159 Encounter for screening for other viral diseases: Secondary | ICD-10-CM | POA: Insufficient documentation

## 2012-02-15 DIAGNOSIS — Z01419 Encounter for gynecological examination (general) (routine) without abnormal findings: Secondary | ICD-10-CM | POA: Insufficient documentation

## 2012-02-15 NOTE — Progress Notes (Signed)
Addended by: Jobie Quaker on: 02/15/2012 09:18 AM   Modules accepted: Orders

## 2012-02-17 ENCOUNTER — Ambulatory Visit: Payer: Self-pay | Admitting: Hematology & Oncology

## 2012-02-18 ENCOUNTER — Encounter: Payer: Self-pay | Admitting: Internal Medicine

## 2012-03-11 ENCOUNTER — Telehealth: Payer: Self-pay | Admitting: Internal Medicine

## 2012-03-11 ENCOUNTER — Encounter: Payer: Self-pay | Admitting: Hematology & Oncology

## 2012-03-11 NOTE — Telephone Encounter (Signed)
Lower back pain does not cause kidney damage.  Please let patient know I am on vacattion, if she has any other urgent qeustions direct to dr walker

## 2012-03-11 NOTE — Telephone Encounter (Signed)
Caller: Michelyn/Patient DR Darrick Huntsman [phone] : is calling with a question about Tramadol (given by office of [facility])  states using for knee pain, states this RX is causing nausea, only took one time 03/09/12  due to vomiting, headache, lower back pain;so she stopped the RX. Advised to call office where the RX was given. Pt wonders if lower back pain could be causing kidney damage (denies discomfort now) Please call.

## 2012-03-11 NOTE — Telephone Encounter (Signed)
Patient notified

## 2012-03-14 ENCOUNTER — Ambulatory Visit: Payer: Self-pay | Admitting: General Practice

## 2012-03-14 LAB — URINALYSIS, COMPLETE
Bacteria: NONE SEEN
Bilirubin,UR: NEGATIVE
Glucose,UR: NEGATIVE mg/dL (ref 0–75)
Ketone: NEGATIVE
Leukocyte Esterase: NEGATIVE
Nitrite: NEGATIVE
Ph: 6 (ref 4.5–8.0)
Protein: NEGATIVE
Squamous Epithelial: 1
WBC UR: <1 /HPF (ref 0–5)

## 2012-03-14 LAB — BASIC METABOLIC PANEL
Creatinine: 0.82 mg/dL (ref 0.60–1.30)
EGFR (African American): >60
Glucose: 119 mg/dL — ABNORMAL HIGH (ref 65–99)
Osmolality: 278 (ref 275–301)
Sodium: 139 mmol/L (ref 136–145)

## 2012-03-14 LAB — CBC
HCT: 44.8 % (ref 35.0–47.0)
MCH: 31.3 pg (ref 26.0–34.0)
MCV: 93 fL (ref 80–100)
RBC: 4.8 10*6/uL (ref 3.80–5.20)
RDW: 13.7 % (ref 11.5–14.5)
WBC: 7.5 10*3/uL (ref 3.6–11.0)

## 2012-03-14 LAB — SEDIMENTATION RATE: Erythrocyte Sed Rate: 11 mm/hr (ref 0–30)

## 2012-03-14 LAB — APTT: Activated PTT: 33.7 secs (ref 23.6–35.9)

## 2012-03-14 LAB — PROTIME-INR: Prothrombin Time: 13.1 secs (ref 11.5–14.7)

## 2012-03-15 LAB — URINE CULTURE

## 2012-03-30 ENCOUNTER — Inpatient Hospital Stay: Payer: Self-pay | Admitting: General Practice

## 2012-03-31 LAB — BASIC METABOLIC PANEL
Anion Gap: 8 (ref 7–16)
BUN: 9 mg/dL (ref 7–18)
Calcium, Total: 7.8 mg/dL — ABNORMAL LOW (ref 8.5–10.1)
Co2: 23 mmol/L (ref 21–32)
EGFR (African American): >60
Osmolality: 269 (ref 275–301)
Potassium: 3.7 mmol/L (ref 3.5–5.1)

## 2012-03-31 LAB — PLATELET COUNT: Platelet: 138 10*3/uL — ABNORMAL LOW (ref 150–440)

## 2012-03-31 LAB — HEMOGLOBIN: HGB: 11.1 g/dL — ABNORMAL LOW (ref 12.0–16.0)

## 2012-04-01 LAB — BASIC METABOLIC PANEL
BUN: 9 mg/dL (ref 7–18)
Calcium, Total: 8.9 mg/dL (ref 8.5–10.1)
Creatinine: 0.94 mg/dL (ref 0.60–1.30)
EGFR (African American): >60
EGFR (Non-African Amer.): >60
Glucose: 144 mg/dL — ABNORMAL HIGH (ref 65–99)
Osmolality: 281 (ref 275–301)
Potassium: 3.9 mmol/L (ref 3.5–5.1)
Sodium: 140 mmol/L (ref 136–145)

## 2012-04-01 LAB — PLATELET COUNT: Platelet: 162 10*3/uL (ref 150–440)

## 2012-04-01 LAB — HEMOGLOBIN: HGB: 12.8 g/dL (ref 12.0–16.0)

## 2012-04-04 ENCOUNTER — Encounter: Payer: Self-pay | Admitting: Internal Medicine

## 2012-04-13 DIAGNOSIS — R002 Palpitations: Secondary | ICD-10-CM

## 2012-04-21 ENCOUNTER — Telehealth: Payer: Self-pay | Admitting: Internal Medicine

## 2012-04-21 NOTE — Telephone Encounter (Signed)
Caller: [place]/Patient; PCP: Duncan Dull; CB#: [phone]; Call regarding Episode of Dizziness/Vertigo after bending over to wash hair.; Onset: 04/21/12. Afebrile.  Recent knee replacement.  BP NL and FBS 120. On 2 new meds; Celebrex and unknown RX for acid reflux. Stopped taking iron d/t constipation.  Advised to see MD within 24 hrs for symptoms began after starting or changing RX and non RX meds per Dizziness or Vertigo Guideline.  Has PT at 1530 04/22/12.  No appts remain for 04/22/12 except a same day appt.  Info noted and sent to [facility] for call back to pt.

## 2012-04-21 NOTE — Telephone Encounter (Signed)
Patient notified

## 2012-04-21 NOTE — Telephone Encounter (Signed)
If the dizziness was just positional after washing her hair and has not recurred,  she does not to be me to be seen urgently. She might try taking a decongestant and antihistamines he of part of the problem is in i her in her ear .

## 2012-04-27 ENCOUNTER — Ambulatory Visit (INDEPENDENT_AMBULATORY_CARE_PROVIDER_SITE_OTHER): Payer: Medicare Other | Admitting: Internal Medicine

## 2012-04-27 ENCOUNTER — Encounter: Payer: Self-pay | Admitting: Internal Medicine

## 2012-04-27 VITALS — BP 124/72 | HR 72 | Temp 98.8°F | Resp 16 | Wt 171.5 lb

## 2012-04-27 DIAGNOSIS — R42 Dizziness and giddiness: Secondary | ICD-10-CM

## 2012-04-27 DIAGNOSIS — Z96659 Presence of unspecified artificial knee joint: Secondary | ICD-10-CM

## 2012-04-27 NOTE — Progress Notes (Signed)
Patient ID: Jacqueline Mata, female   DOB: 05-19-1941, 71 y.o.   MRN: [id number] Patient Active Problem List  Diagnosis  . Diabetes mellitus  . Hyperlipidemia  . Hypothyroidism  . Breast cancer  . Dizziness - light-headed  . Status post total knee replacement    Subjective:  CC:   Chief Complaint  Patient presents with  . Follow-up    after rehab from surgery    HPI:   Jacqueline Mata a 71 y.o. female who presents for followup on chronic and acute issues. She is recovering from a total knee replacement July 3rd, done by Dr. Ernest Pine.   [name]  is conducting her physical therapy at  [facility]  clinic and she is very pleased with his management and her improvement.   she took subcutaneous  Lovenox shots for post op prevention of DVT.  Those have been  stopped and she is taking her aspirin daily .  Some trouble with constipation, using Sennokot, now colace.   she is taking daily celebrex and tylenol for pain.   she had a recent dizzy spell after taking tramadol .  She experienced  Nausea  and dizziness with Nucynta.  her pain is Pain controlled currently on a nonnarcotic regimen and she follows up with Dr. Ernest Pine on August 15. She is ambulating well with only a cane.    2)  recent dermatology appointment with [facility]  for removal of a squamous cell cancer  taken off her right cheek.   she was referred to [facility] Dr Sallee Lange for  removal of another skin cancer on her forehead with a  Moh's procedure ,  3) she has had several episodes of vertigo in the last several weeks;  currently she has occasional dizziness without true vertigo when standing still.    Past Medical History  Diagnosis Date  . Diabetes mellitus   . Hyperlipidemia   . hypothyroidism   . breast cancer     Past Surgical History  Procedure Date  . Joint replacement 2013    by Dr. Ernest Pine         The following portions of the patient's history were reviewed and updated as appropriate: Allergies, current medications, and  problem list.    Review of Systems:   12 Pt  review of systems was negative except those addressed in the HPI,     History   Social History  . Marital Status: Single    Spouse Name: N/A    Number of Children: N/A  . Years of Education: N/A   Occupational History  . Not on file.   Social History Main Topics  . Smoking status: Former Smoker    Quit date: 07/22/1978  . Smokeless tobacco: Never Used  . Alcohol Use: Yes     rare  . Drug Use: No  . Sexually Active: Not on file   Other Topics Concern  . Not on file   Social History Narrative  . No narrative on file    Objective:  BP 124/72  Pulse 72  Temp 98.8 F (37.1 C) (Oral)  Resp 16  Wt 171 lb 8 oz (77.792 kg)  SpO2 96%  General appearance: alert, cooperative and appears stated age Neck: no adenopathy, no carotid bruit, supple, symmetrical, trachea midline and thyroid not enlarged, symmetric, no tenderness/mass/nodules Back: symmetric, no curvature. ROM normal. No CVA tenderness. Lungs: clear to auscultation bilaterally Heart: regular rate and rhythm, S1, S2 normal, no murmur, click, rub or gallop Abdomen: soft, non-tender;  bowel sounds normal; no masses,  no organomegaly Pulses: 2+ and symmetric Skin: Skin color, texture, turgor normal. No rashes or lesions Lymph nodes: Cervical, supraclavicular, and axillary nodes normal. Neurologic:  Grossly nonfocal.  negative Romberg's sign  Assessment and Plan:  Dizziness - light-headed She is not orthostatic by my check today and her neurologic exam is normal.. I suspect that her dizziness is due to the recent generalized seizure she did experience for her knee surgery. If she continues to have episodes of dizziness after 6 weeks from July 3 we will consider alternative diagnoses including but not limited to sinus disease and metastatic disease from her breast cancer.  Status post total knee replacement 03/30/2012 by Dr. Ernest Pine with good results. She has been  prescribed amoxicillin to take 1 hour prior to any dental or other invasive procedures for SBE prophylaxis. She's taking an aspirin daily granulating well.   Updated Medication List Outpatient Encounter Prescriptions as of 04/27/2012  Medication Sig Dispense Refill  . acetaminophen (TYLENOL) 500 MG tablet Take 500 mg by mouth 2 (two) times daily.      . Ascorbic Acid (VITAMIN C) 100 MG tablet Take 100 mg by mouth 2 (two) times daily.      Marland Kitchen aspirin 81 MG tablet Take 81 mg by mouth daily.        Marland Kitchen azelastine (ASTELIN) 137 MCG/SPRAY nasal spray Place 1 spray into the nose 2 (two) times daily. Use in each nostril as directed  30 mL  12  . celecoxib (CELEBREX) 200 MG capsule Take 200 mg by mouth daily.      . cholecalciferol (VITAMIN D) 1000 UNITS tablet Take 1,000 Units by mouth daily.        Marland Kitchen docusate sodium (COLACE) 100 MG capsule Take 100 mg by mouth daily.      Marland Kitchen exemestane (AROMASIN) 25 MG tablet TAKE 1 TABLET BY MOUTH DAILY  30 tablet  3  . fluocinonide cream (LIDEX) 0.05 % Apply topically as needed.      . folic acid (FOLVITE) 1 MG tablet Take 1 tablet (1 mg total) by mouth daily.  90 tablet  3  . levothyroxine (SYNTHROID, LEVOTHROID) 100 MCG tablet Take 1 tablet (100 mcg total) by mouth daily.  30 tablet  4  . levothyroxine (SYNTHROID, LEVOTHROID) 88 MCG tablet Take 1 tablet (88 mcg total) by mouth daily.  30 tablet  6  . pantoprazole (PROTONIX) 40 MG tablet Take 40 mg by mouth daily.      . phenylephrine (SUDAFED PE) 10 MG TABS Take 10 mg by mouth daily.         No orders of the defined types were placed in this encounter.    No Follow-up on file.

## 2012-04-27 NOTE — Patient Instructions (Addendum)
Return in 3 months for reevaluation of dizziness.  We will investigate further with blood work and MRI if persistent.   Take the sennokot every 3 or 4 days if still constipated.,  But try adding metamucil daily for now

## 2012-05-01 ENCOUNTER — Encounter: Payer: Self-pay | Admitting: Internal Medicine

## 2012-05-01 DIAGNOSIS — H8190 Unspecified disorder of vestibular function, unspecified ear: Secondary | ICD-10-CM | POA: Insufficient documentation

## 2012-05-01 DIAGNOSIS — Z96659 Presence of unspecified artificial knee joint: Secondary | ICD-10-CM | POA: Insufficient documentation

## 2012-05-01 DIAGNOSIS — H832X9 Labyrinthine dysfunction, unspecified ear: Secondary | ICD-10-CM | POA: Insufficient documentation

## 2012-05-01 NOTE — Assessment & Plan Note (Signed)
She is not orthostatic by my check today and her neurologic exam is normal.. I suspect that her dizziness is due to the recent generalized seizure she did experience for her knee surgery. If she continues to have episodes of dizziness after 6 weeks from July 3 we will consider alternative diagnoses including but not limited to sinus disease and metastatic disease from her breast cancer.

## 2012-05-01 NOTE — Assessment & Plan Note (Signed)
03/30/2012 by Dr. Ernest Pine with good results. She has been prescribed amoxicillin to take 1 hour prior to any dental or other invasive procedures for SBE prophylaxis. She's taking an aspirin daily granulating well.

## 2012-05-03 ENCOUNTER — Other Ambulatory Visit: Payer: Self-pay | Admitting: Hematology & Oncology

## 2012-05-12 ENCOUNTER — Telehealth: Payer: Self-pay | Admitting: Internal Medicine

## 2012-05-12 DIAGNOSIS — H6980 Other specified disorders of Eustachian tube, unspecified ear: Secondary | ICD-10-CM

## 2012-05-12 MED ORDER — FLUTICASONE PROPIONATE 50 MCG/ACT NA SUSP: 2.0000 | Freq: Every day | NASAL | Status: DC

## 2012-05-12 NOTE — Telephone Encounter (Signed)
i woul like to try a different nasal spray,  Something with a little steroid in it to see if it makes a difference in her inner ear/eustachean tubes .  Make her an appt for 2 weeks if not.  i will call in to hr pharmacy

## 2012-05-12 NOTE — Telephone Encounter (Signed)
Patient is still having problems with her inner ear and being off balance wants to know what should be done next.

## 2012-05-13 NOTE — Telephone Encounter (Signed)
Left message asking patient to call back

## 2012-05-13 NOTE — Telephone Encounter (Signed)
Pt came in today wanting to know why nasal spray was called in for her.  I read dr Melina Schools note to pt.  Pt made appointment for 05/20/12 but wanted to be sooner. Pt stated she saw dr hooten yesterday morning.  And pt stated dr Ernest Pine told her to go back to dr Darrick Huntsman before her follow up in oct Pt wanted to know if she needs to stop taking the decongest when she takes the flow nase Pt stated her dizzy and spining is getting worse more frequent.  Pt stated it is very hard to sleep Please advise pt on what to do

## 2012-05-13 NOTE — Telephone Encounter (Signed)
Patient notified

## 2012-05-20 ENCOUNTER — Encounter: Payer: Self-pay | Admitting: Internal Medicine

## 2012-05-20 ENCOUNTER — Ambulatory Visit (INDEPENDENT_AMBULATORY_CARE_PROVIDER_SITE_OTHER): Payer: Medicare Other | Admitting: Internal Medicine

## 2012-05-20 VITALS — BP 114/68 | HR 70 | Temp 97.9°F | Resp 14 | Wt 173.5 lb

## 2012-05-20 DIAGNOSIS — I10 Essential (primary) hypertension: Secondary | ICD-10-CM

## 2012-05-20 DIAGNOSIS — R42 Dizziness and giddiness: Secondary | ICD-10-CM

## 2012-05-20 MED ORDER — HYDROCHLOROTHIAZIDE 12.5 MG PO TABS: 12.5000 mg | ORAL_TABLET | Freq: Every day | ORAL | Status: DC

## 2012-05-20 NOTE — Patient Instructions (Addendum)
We are going to try a low dose of hctz (a diuretic) for a few weeks to see if it helps the dizziness  Continue the flonase but stop the azelastine  If you can tolerate  A few days of Sudafed PE may help reduce the congestion in your ears.  ENT referral if no improvement

## 2012-05-20 NOTE — Progress Notes (Signed)
Patient ID: Jacqueline Mata, female   DOB: 12/04/1940, 71 y.o.   MRN: [id number]  Patient Active Problem List  Diagnosis  . Diabetes mellitus  . Hyperlipidemia  . Hypothyroidism  . Breast cancer  . Dizziness - light-headed  . Status post total knee replacement    Subjective:  CC:   Chief Complaint  Patient presents with  . Balance Issues    HPI:   Jacqueline Mata a 71 y.o. female who presents for follow up on dizziness which has been present since her knee surgery in early July.  Her symptoms have improved but not resolved and is still problematic with walking and stopping .  No headaches or double vision.  No sinus symptoms.  No history of falls or vertigo.     Past Medical History  Diagnosis Date  . Diabetes mellitus   . Hyperlipidemia   . hypothyroidism   . breast cancer     Past Surgical History  Procedure Date  . Joint replacement 2013    by Dr. Ernest Pine    The following portions of the patient's history were reviewed and updated as appropriate: Allergies, current medications, and problem list.    Review of Systems:   12 Pt  review of systems was negative except those addressed in the HPI,     History   Social History  . Marital Status: Single    Spouse Name: N/A    Number of Children: N/A  . Years of Education: N/A   Occupational History  . Not on file.   Social History Main Topics  . Smoking status: Former Smoker    Quit date: 07/22/1978  . Smokeless tobacco: Never Used  . Alcohol Use: Yes     rare  . Drug Use: No  . Sexually Active: Not on file   Other Topics Concern  . Not on file   Social History Narrative  . No narrative on file    Objective:  BP 114/68  Pulse 70  Temp 97.9 F (36.6 C) (Oral)  Resp 14  Wt 173 lb 8 oz (78.699 kg)  SpO2 96%  General appearance: alert, cooperative and appears stated age Ears: normal TM's and external ear canals both ears Throat: lips, mucosa, and tongue normal; teeth and gums normal Neck:  no adenopathy, no carotid bruit, supple, symmetrical, trachea midline and thyroid not enlarged, symmetric, no tenderness/mass/nodules Back: symmetric, no curvature. ROM normal. No CVA tenderness. Lungs: clear to auscultation bilaterally Heart: regular rate and rhythm, S1, S2 normal, no murmur, click, rub or gallop Abdomen: soft, non-tender; bowel sounds normal; no masses,  no organomegaly Pulses: 2+ and symmetric Skin: Skin color, texture, turgor normal. No rashes or lesions Lymph nodes: Cervical, supraclavicular, and axillary nodes normal. Neuroglogic: grossly nonfocal.   Assessment and Plan:  Dizziness - light-headed She has an effusion on exam of left ear. trial of steroid nasal spray, Sudafed PE and low dose diuretic for a few weeks .  If no improvement, ENT referral  For Hallpike maneuver .,  Will consider MRI og brain given history of CA.  But neurologic exam is normal    Updated Medication List Outpatient Encounter Prescriptions as of 05/20/2012  Medication Sig Dispense Refill  . acetaminophen (TYLENOL) 500 MG tablet Take 500 mg by mouth 2 (two) times daily.      Marland Kitchen aspirin 81 MG tablet Take 81 mg by mouth daily.        . celecoxib (CELEBREX) 200 MG capsule Take 200 mg by mouth  daily.      . cholecalciferol (VITAMIN D) 1000 UNITS tablet Take 1,000 Units by mouth daily.        Marland Kitchen exemestane (AROMASIN) 25 MG tablet TAKE 1 TABLET BY MOUTH DAILY  30 tablet  3  . fluocinonide cream (LIDEX) 0.05 % Apply topically as needed.      . fluticasone (FLONASE) 50 MCG/ACT nasal spray Place 2 sprays into the nose daily.  1 g  0  . folic acid (FOLVITE) 1 MG tablet Take 1 tablet (1 mg total) by mouth daily.  90 tablet  3  . levothyroxine (SYNTHROID, LEVOTHROID) 100 MCG tablet Take 1 tablet (100 mcg total) by mouth daily.  30 tablet  4  . levothyroxine (SYNTHROID, LEVOTHROID) 88 MCG tablet Take 1 tablet (88 mcg total) by mouth daily.  30 tablet  6  . hydrochlorothiazide (HYDRODIURIL) 12.5 MG tablet  Take 1 tablet (12.5 mg total) by mouth daily.  30 tablet  1  . DISCONTD: Ascorbic Acid (VITAMIN C) 100 MG tablet Take 100 mg by mouth 2 (two) times daily.      Marland Kitchen DISCONTD: azelastine (ASTELIN) 137 MCG/SPRAY nasal spray Place 1 spray into the nose 2 (two) times daily. Use in each nostril as directed  30 mL  12  . DISCONTD: docusate sodium (COLACE) 100 MG capsule Take 100 mg by mouth daily.      Marland Kitchen DISCONTD: pantoprazole (PROTONIX) 40 MG tablet Take 40 mg by mouth daily.      Marland Kitchen DISCONTD: phenylephrine (SUDAFED PE) 10 MG TABS Take 10 mg by mouth daily.

## 2012-05-21 ENCOUNTER — Encounter: Payer: Self-pay | Admitting: Internal Medicine

## 2012-05-21 NOTE — Assessment & Plan Note (Addendum)
She has an effusion on exam of left ear. trial of steroid nasal spray, Sudafed PE and low dose diuretic for a few weeks .  If no improvement, ENT referral  For Hallpike maneuver .,  Will consider MRI og brain given history of CA.  But neurologic exam is normal

## 2012-05-25 ENCOUNTER — Ambulatory Visit: Payer: Medicare Other | Admitting: Internal Medicine

## 2012-06-22 ENCOUNTER — Ambulatory Visit: Payer: Self-pay | Admitting: Unknown Physician Specialty

## 2012-07-07 ENCOUNTER — Other Ambulatory Visit (HOSPITAL_BASED_OUTPATIENT_CLINIC_OR_DEPARTMENT_OTHER): Payer: Medicare Other | Admitting: Lab

## 2012-07-07 ENCOUNTER — Ambulatory Visit (HOSPITAL_BASED_OUTPATIENT_CLINIC_OR_DEPARTMENT_OTHER): Payer: Medicare Other | Admitting: Hematology & Oncology

## 2012-07-07 VITALS — BP 111/76 | HR 79 | Temp 98.1°F | Resp 16 | Ht 64.0 in | Wt 173.0 lb

## 2012-07-07 DIAGNOSIS — E559 Vitamin D deficiency, unspecified: Secondary | ICD-10-CM

## 2012-07-07 DIAGNOSIS — Z853 Personal history of malignant neoplasm of breast: Secondary | ICD-10-CM

## 2012-07-07 DIAGNOSIS — M810 Age-related osteoporosis without current pathological fracture: Secondary | ICD-10-CM

## 2012-07-07 DIAGNOSIS — C50919 Malignant neoplasm of unspecified site of unspecified female breast: Secondary | ICD-10-CM

## 2012-07-07 DIAGNOSIS — Z170 Estrogen receptor positive status [ER+]: Secondary | ICD-10-CM

## 2012-07-07 LAB — CBC WITH DIFFERENTIAL (CANCER CENTER ONLY)
BASO#: 0 10*3/uL (ref 0.0–0.2)
EOS%: 3.2 % (ref 0.0–7.0)
Eosinophils Absolute: 0.3 10*3/uL (ref 0.0–0.5)
HGB: 14.4 g/dL (ref 11.6–15.9)
LYMPH#: 2.7 10*3/uL (ref 0.9–3.3)
NEUT#: 5.3 10*3/uL (ref 1.5–6.5)
RBC: 4.63 10*6/uL (ref 3.70–5.32)
WBC: 8.9 10*3/uL (ref 3.9–10.0)

## 2012-07-07 NOTE — Progress Notes (Signed)
This office note has been dictated.

## 2012-07-08 LAB — COMPREHENSIVE METABOLIC PANEL
AST: 14 U/L (ref 0–37)
Albumin: 4.5 g/dL (ref 3.5–5.2)
BUN: 18 mg/dL (ref 6–23)
Calcium: 9.6 mg/dL (ref 8.4–10.5)
Chloride: 105 mEq/L (ref 96–112)
Glucose, Bld: 83 mg/dL (ref 70–99)
Potassium: 3.8 mEq/L (ref 3.5–5.3)

## 2012-07-08 NOTE — Progress Notes (Signed)
DIAGNOSIS:  Bilateral stage IIA (T2 N0 M0) ductal carcinoma of the breast.  CURRENT THERAPY:  Observation.  INTERIM HISTORY:  Jacqueline Mata comes in for followup.  We saw her back in April.  She has been doing well.  She had her left knee replaced. This was in August.  She feels a whole lot better now.  She has done incredibly well with physical therapy.  She has an incredible range of motion of the left knee.  She has had no cough or shortness breath.  There has been no change in bowel or bladder habits.  She has been able to be outside more and do more activities since her knee was replaced.  PHYSICAL EXAMINATION:  This is a well-developed, well-nourished white female in no obvious distress.  Vital signs:  Temperature of 98.1, pulse 79, respiratory rate 16, blood pressure 111/76.  Weight is 173.  Head and neck:  Normocephalic, atraumatic skull.  There are no ocular or oral lesions.  There are no palpable cervical or supraclavicular lymph nodes. Lungs:  Clear bilaterally.  Cardiac:  Regular rate and rhythm with a normal S1, S2.  There are no murmurs, rubs or bruits.  Breasts:  Right breast with a well-healed lumpectomy at the 4 o'clock position.  There is some slight contraction of the right breast.  No distinct mass is noted in the right breast.  There is no right axillary adenopathy.  Left breast shows well-healed lumpectomy at the 9 o'clock position.  No mass is noted in the left breast.  There is no erythema or nodularity of the left breast.  There is no left axillary adenopathy.  Abdomen:  Soft with good bowel sounds.  There is no fluid wave.  There is no palpable abdominal mass.  There is no palpable hepatosplenomegaly.  Back:  No tenderness over the spine, ribs, or hips.  Extremities:  Show the knee replacement scar on the left knee.  There is some slight swelling of the left leg.  No palpable venous cord is noted in the left leg.  Right leg is unremarkable.  Neurologic:  No  focal neurological deficits.  LABORATORY STUDIES:  White cell count is 8.9, hemoglobin 14.4, hematocrit 42.4, platelet count 195.  IMPRESSION:  Jacqueline Mata is a very nice 71 year old white female with bilateral stage IIA ductal carcinomas.  She was node negative.  Her tumor was ER positive.  She received adjuvant chemotherapy.  She has now out by 8 years.  She completed treatments back in November 2005.  We will go ahead and plan to get her back in 6 months now.  I do not see that we have to do any lab work or x-rays in between visits.    ______________________________ Josph Macho, M.D. PRE/MEDQ  D:  07/07/2012  T:  07/08/2012  Job:  [id number]

## 2012-07-11 ENCOUNTER — Encounter: Payer: Self-pay | Admitting: *Deleted

## 2012-07-14 ENCOUNTER — Other Ambulatory Visit: Payer: Self-pay | Admitting: *Deleted

## 2012-07-14 MED ORDER — FOLIC ACID 1 MG PO TABS: 1.0000 mg | ORAL_TABLET | Freq: Every day | ORAL | Status: DC

## 2012-07-14 NOTE — Telephone Encounter (Signed)
R'cd fax from [facility] pharmacy for refill of Folic Acid.

## 2012-07-27 IMAGING — CR DG KNEE 1-2V*L*
1 series · 2 of 2 positions shown · non-contrast
Comparison: none

REASON FOR EXAM: postop
COMMENTS:   Bedside (portable):Y

[Series 1: ap · 0.17mm/px · 2 of 2 slices shown]
[im 1/2]
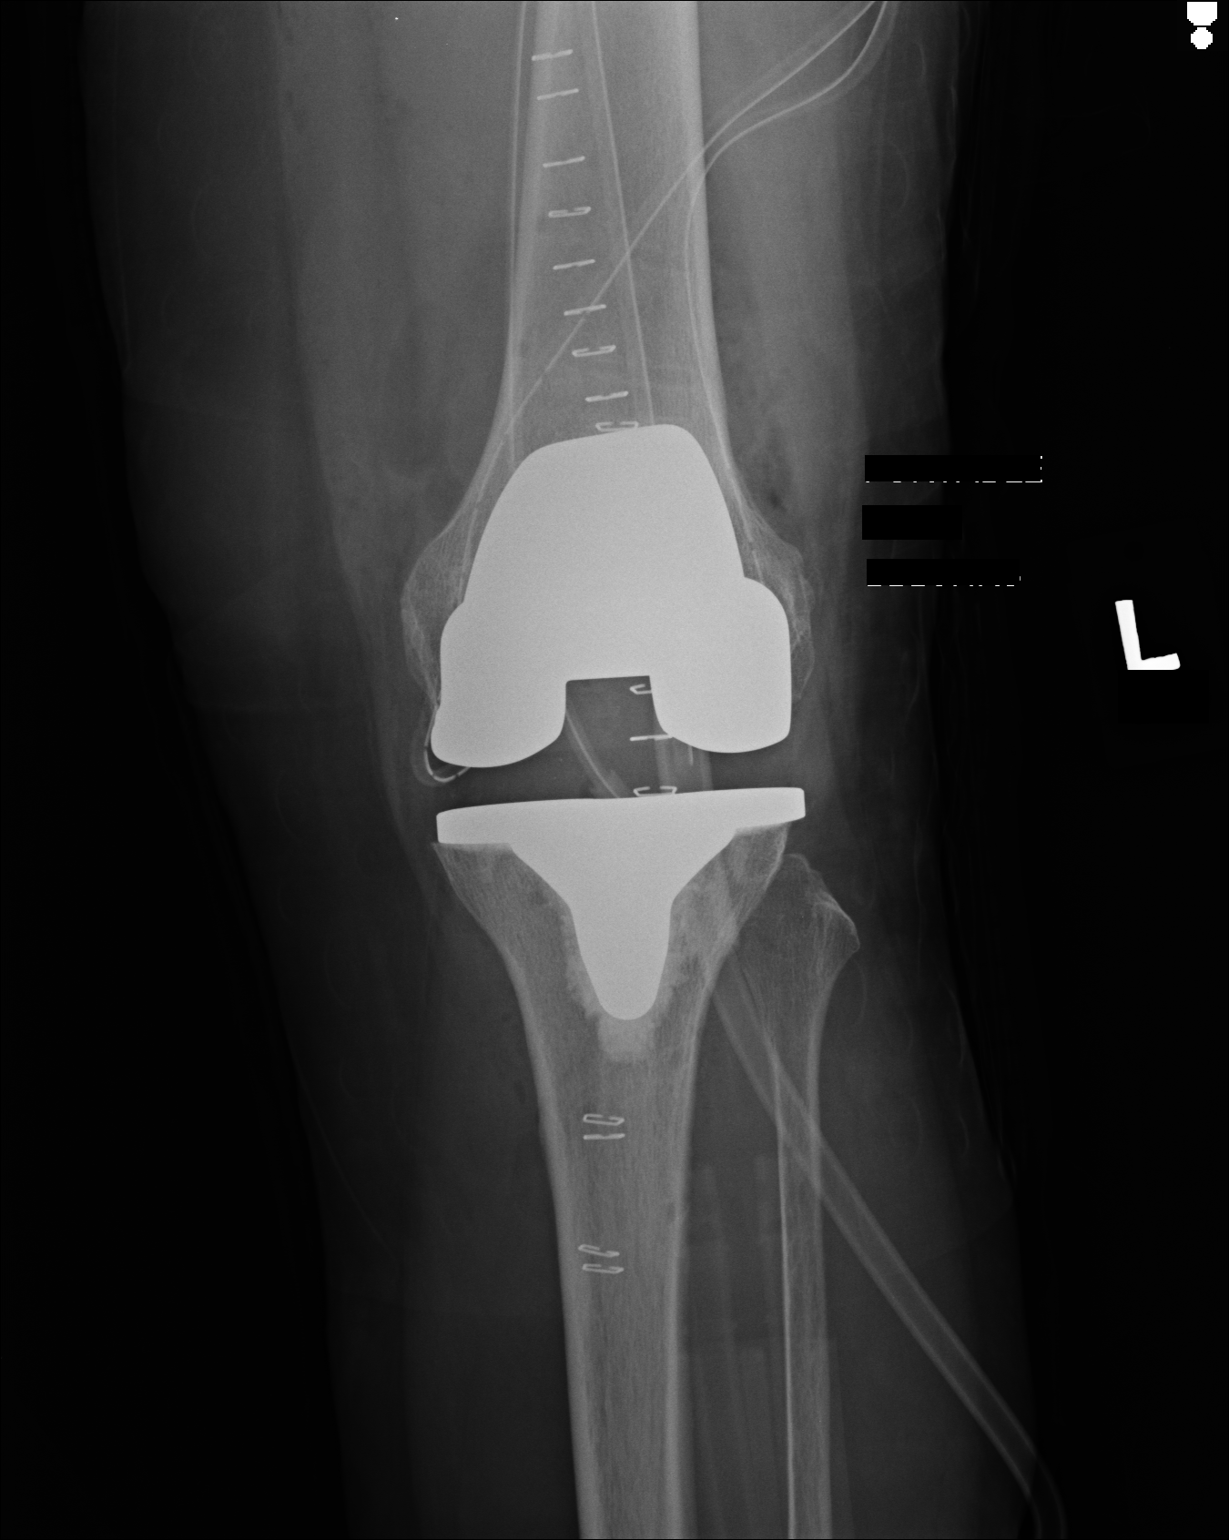
[im 2/2]
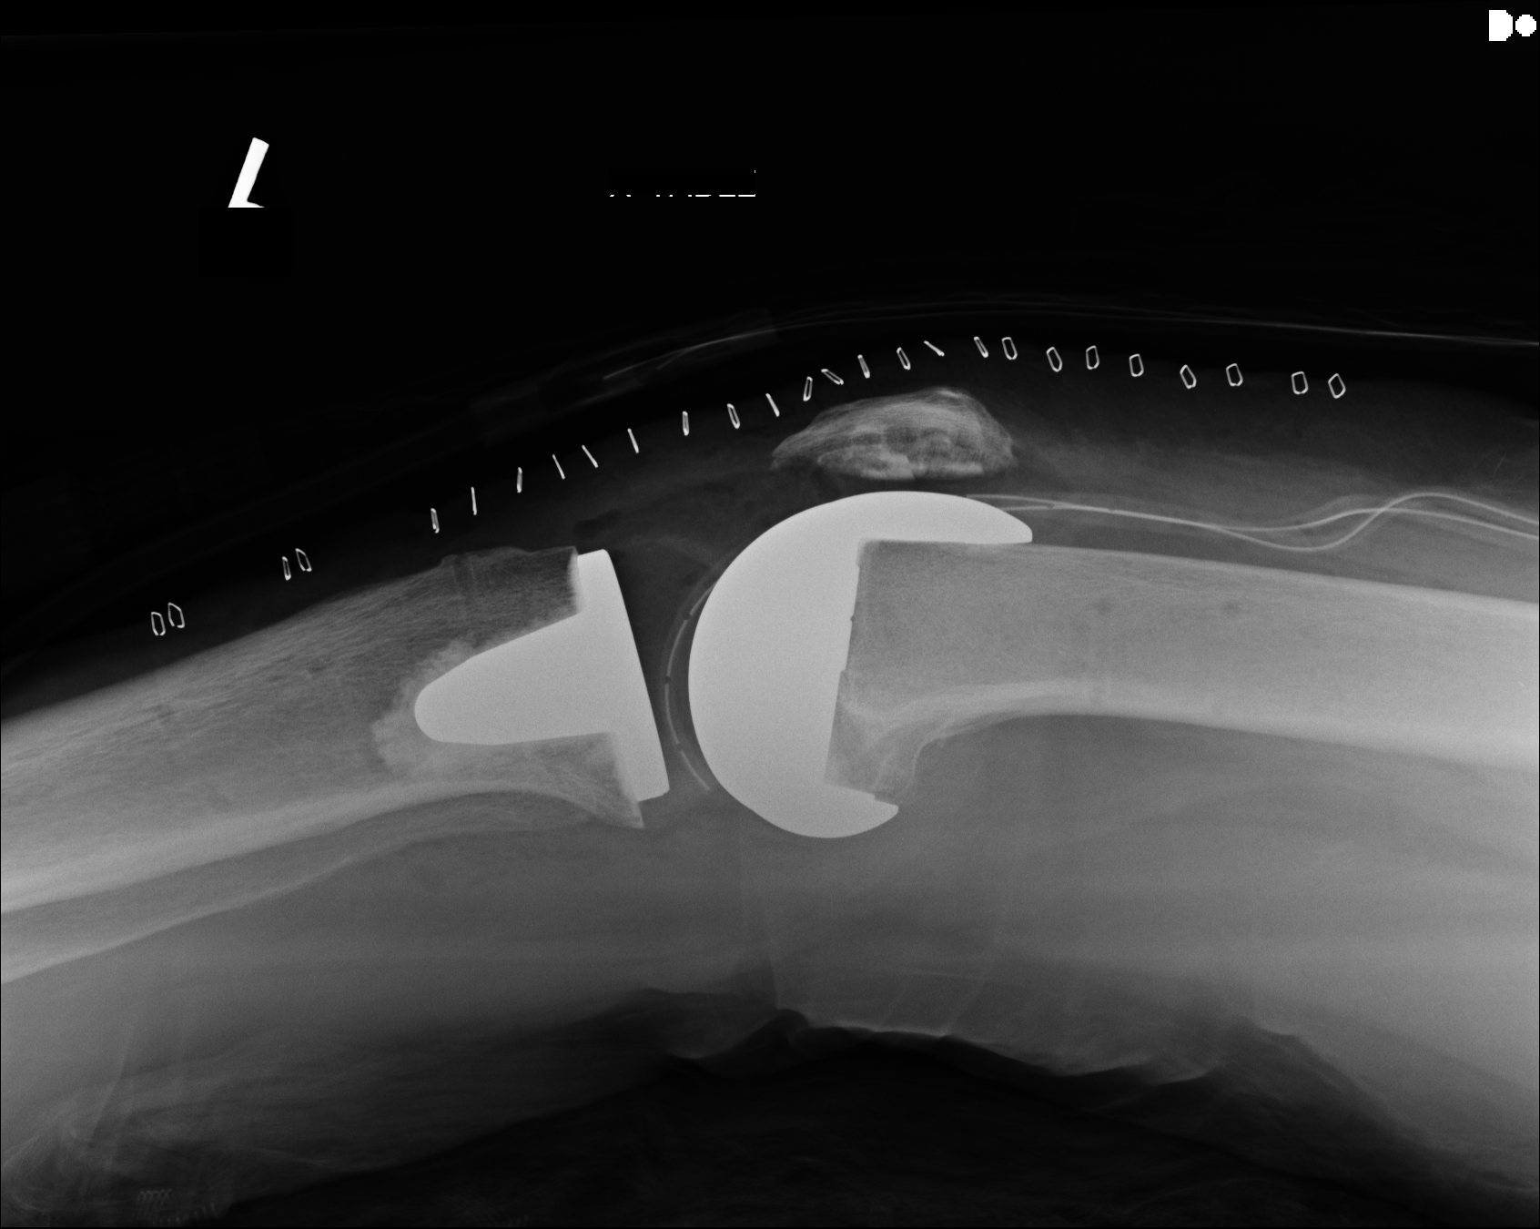

[2 of 2 positions shown; findings below may reference images not displayed]

PROCEDURE:     DXR - DXR KNEE LEFT AP AND LATERAL  - March 30, 2012 [DATE]

RESULT:     AP and lateral portable views from the recovery room reveal the
patient to have undergone total left knee prosthesis placement. Radiographic
positioning of the prosthetic components is good. Orthopedic drain lines and
skin staples are present.
IMPRESSION: The patient has undergone left total knee joint prosthesis
placement. Further interpretation is deferred to Dr.Tiger.

## 2012-07-28 ENCOUNTER — Ambulatory Visit (INDEPENDENT_AMBULATORY_CARE_PROVIDER_SITE_OTHER): Payer: Medicare Other | Admitting: Internal Medicine

## 2012-07-28 ENCOUNTER — Encounter: Payer: Self-pay | Admitting: Internal Medicine

## 2012-07-28 VITALS — BP 120/72 | HR 89 | Temp 98.6°F | Ht 65.5 in | Wt 174.5 lb

## 2012-07-28 DIAGNOSIS — H8190 Unspecified disorder of vestibular function, unspecified ear: Secondary | ICD-10-CM

## 2012-07-28 DIAGNOSIS — E039 Hypothyroidism, unspecified: Secondary | ICD-10-CM

## 2012-07-28 DIAGNOSIS — E119 Type 2 diabetes mellitus without complications: Secondary | ICD-10-CM

## 2012-07-28 DIAGNOSIS — H832X9 Labyrinthine dysfunction, unspecified ear: Secondary | ICD-10-CM

## 2012-07-28 DIAGNOSIS — C50919 Malignant neoplasm of unspecified site of unspecified female breast: Secondary | ICD-10-CM

## 2012-07-28 DIAGNOSIS — E785 Hyperlipidemia, unspecified: Secondary | ICD-10-CM

## 2012-07-28 LAB — MICROALBUMIN / CREATININE URINE RATIO
Creatinine,U: 82.2 mg/dL
Microalb Creat Ratio: 0.2 mg/g (ref 0.0–30.0)
Microalb, Ur: 0.2 mg/dL (ref 0.0–1.9)

## 2012-07-28 MED ORDER — TETANUS-DIPHTH-ACELL PERTUSSIS 5-2.5-18.5 LF-MCG/0.5 IM SUSP: 0.5000 mL | Freq: Once | INTRAMUSCULAR | Status: DC

## 2012-07-28 NOTE — Progress Notes (Signed)
Rx for Tdap sent to [facility] pharmacy per patients request, she didn't want to wait for Dr. Darrick Huntsman to get out of a room to sign a written Rx for the vaccine.

## 2012-07-28 NOTE — Patient Instructions (Addendum)
Check with your insurance about whether they will reimburse the expense of the shower installation

## 2012-07-28 NOTE — Progress Notes (Signed)
Patient ID: Jacqueline Mata, female   DOB: [birth year], 71 y.o.   MRN: [id number]  Patient Active Problem List  Diagnosis  . Diabetes mellitus type 2, diet-controlled  . Hyperlipidemia  . Hypothyroidism  . Breast cancer  . Balance problem due to vestibular dysfunction  . Status post total knee replacement    Subjective:  CC:   Chief Complaint  Patient presents with  . Follow-up    HPI:   Jacqueline Mata a 71 y.o. female who presents Followup on multiple ongoing medical issues. Her recent episodes of persistent dizziness following her knee surgery  resolved with decongestant and steroid nasal spray,  But  not before she saw ENT Dr. Jenne Campus who ordered an MRI due to appreciation of a left gazing nystagmus.  The MRi was  normal.  However,  did a workup revealed a 29% weakness of the left vestibular nerve .  Her symptoms are aggravated by being in the shower. She is having her shower stall rebuilt with a built-in seat and several shower bars. Given her new diagnoses of vestibular dysfunction and DJD of knees, I think there is a good chance with that insurance will either fully or partially reimburse her for the cost of the shower.   2) Moodiness.  She has been having episodes of feeling down and depressed. She is occasions a lot of loss of interest and has had less social contact lately. She is sleeping well. Appetite is good. She is not suicidal. 3) desire to lose weight. She has been researching various diets and has brought on with her that if she has questions about. The diet is well supported by an M.D. It does  involve protein shakes made with a proprietary protein blend and a healthy combination of low and high glycemic index fruits. She wonders whether this will be okay with her history of diabetes.   Past Medical History  Diagnosis Date  . Diabetes mellitus   . Hyperlipidemia   . hypothyroidism   . breast cancer     Past Surgical History  Procedure Date  . Joint replacement  2013    by Dr. Ernest Pine         The following portions of the patient's history were reviewed and updated as appropriate: Allergies, current medications, and problem list.    Review of Systems:   12 Pt  review of systems was negative except those addressed in the HPI,     History   Social History  . Marital Status: Single    Spouse Name: N/A    Number of Children: N/A  . Years of Education: N/A   Occupational History  . Not on file.   Social History Main Topics  . Smoking status: Former Smoker    Quit date: 07/22/1978  . Smokeless tobacco: Never Used  . Alcohol Use: Yes     Comment: rare  . Drug Use: No  . Sexually Active: Not on file   Other Topics Concern  . Not on file   Social History Narrative  . No narrative on file    Objective:  BP 120/72  Pulse 89  Temp 98.6 F (37 C) (Oral)  Ht 5' 5.5" (1.664 m)  Wt 174 lb 8 oz (79.153 kg)  BMI 28.60 kg/m2  SpO2 95%  General appearance: alert, cooperative and appears stated age Ears: normal TM's and external ear canals both ears Throat: lips, mucosa, and tongue normal; teeth and gums normal Neck: no adenopathy, no carotid bruit, supple, symmetrical,  trachea midline and thyroid not enlarged, symmetric, no tenderness/mass/nodules Back: symmetric, no curvature. ROM normal. No CVA tenderness. Lungs: clear to auscultation bilaterally Heart: regular rate and rhythm, S1, S2 normal, no murmur, click, rub or gallop Abdomen: soft, non-tender; bowel sounds normal; no masses,  no organomegaly Pulses: 2+ and symmetric Skin: Skin color, texture, turgor normal. No rashes or lesions Lymph nodes: Cervical, supraclavicular, and axillary nodes normal.  Assessment and Plan:  Diabetes mellitus type 2, diet-controlled Spent 10-15 minutes discussing her new diet she wants to try and making suggestions that will keep her blood sugars from becoming elevated. The glycemic index of the foods that were supported by the diet were  reviewed. He is due for hemoglobin A1c fasting lipid panel and microalbumin to creatinine ratio. She is up-to-date on eye exams. Foot exam today was normal. She is on lisinopril and taking a baby aspirin daily.  Breast cancer Surveillance is ongoing managed by Arlan Organ.   Balance problem due to vestibular dysfunction She has 29% loss of function of the vestibular nerve by recent calorimetric  testing by ENT. Her symptoms are aggravated by hot water and therefore continue showering her current shower is a risk. She is having this rebuilt with safety measures in place. I recommended that she speak with her insurance company about whether they will work partially or fully reimburse her for this. Records from Dr. Jenne Campus requested.  Hypothyroidism Manage her thyroid replacement. Last TSH was 1.57 and steroid. We will repeat this with her next blood draw   Updated Medication List Outpatient Encounter Prescriptions as of 07/28/2012  Medication Sig Dispense Refill  . acetaminophen (TYLENOL) 500 MG tablet Take 500 mg by mouth 2 (two) times daily.      Marland Kitchen aspirin 81 MG tablet Take 81 mg by mouth daily.        Marland Kitchen azelastine (ASTELIN) 137 MCG/SPRAY nasal spray Place 2 sprays into the nose every morning. Use in each nostril as directed      . cholecalciferol (VITAMIN D) 1000 UNITS tablet Take 1,000 Units by mouth daily.        . fluocinonide cream (LIDEX) 0.05 % Apply topically as needed.      . folic acid (FOLVITE) 1 MG tablet Take 1 tablet (1 mg total) by mouth daily.  90 tablet  1  . levothyroxine (SYNTHROID, LEVOTHROID) 100 MCG tablet Take 100 mcg by mouth daily. 100 mg one day alt with 88 mg the next day.      . TDaP (BOOSTRIX) 5-2.5-18.5 LF-MCG/0.5 injection Inject 0.5 mLs into the muscle once.  0.5 mL  0  . [DISCONTINUED] TDaP (BOOSTRIX) 5-2.5-18.5 LF-MCG/0.5 injection Inject 0.5 mLs into the muscle once.         Orders Placed This Encounter  Procedures  . DME Other see comment  .  Hemoglobin A1c  . Microalbumin / creatinine urine ratio  . Lipid panel  . TSH    Return in about 3 months (around 10/28/2012).

## 2012-07-31 NOTE — Assessment & Plan Note (Signed)
Manage her thyroid replacement. Last TSH was 1.57 and steroid. We will repeat this with her next blood draw

## 2012-07-31 NOTE — Assessment & Plan Note (Addendum)
Spent 10-15 minutes discussing her new diet she wants to try and making suggestions that will keep her blood sugars from becoming elevated. The glycemic index of the foods that were supported by the diet were reviewed. He is due for hemoglobin A1c fasting lipid panel and microalbumin to creatinine ratio. She is up-to-date on eye exams. Foot exam today was normal. She is on lisinopril and taking a baby aspirin daily.

## 2012-07-31 NOTE — Assessment & Plan Note (Signed)
She has 29% loss of function of the vestibular nerve by recent calorimetric  testing by ENT. Her symptoms are aggravated by hot water and therefore continue showering her current shower is a risk. She is having this rebuilt with safety measures in place. I recommended that she speak with her insurance company about whether they will work partially or fully reimburse her for this. Records from Dr. Jenne Campus requested.

## 2012-07-31 NOTE — Assessment & Plan Note (Signed)
Surveillance is ongoing managed by Arlan Organ.

## 2012-08-15 ENCOUNTER — Other Ambulatory Visit: Payer: Self-pay

## 2012-08-15 MED ORDER — LEVOTHYROXINE SODIUM 88 MCG PO TABS: 88.0000 ug | ORAL_TABLET | Freq: Every day | ORAL | Status: DC

## 2012-08-15 NOTE — Telephone Encounter (Signed)
Refill request for Levothyroxine 88 MCG #30 6 R [facility]

## 2012-08-30 ENCOUNTER — Other Ambulatory Visit (INDEPENDENT_AMBULATORY_CARE_PROVIDER_SITE_OTHER): Payer: Medicare Other

## 2012-08-30 ENCOUNTER — Telehealth: Payer: Self-pay | Admitting: Internal Medicine

## 2012-08-30 DIAGNOSIS — E785 Hyperlipidemia, unspecified: Secondary | ICD-10-CM

## 2012-08-30 DIAGNOSIS — E119 Type 2 diabetes mellitus without complications: Secondary | ICD-10-CM

## 2012-08-30 DIAGNOSIS — E039 Hypothyroidism, unspecified: Secondary | ICD-10-CM

## 2012-08-30 LAB — LIPID PANEL
HDL: 49.7 mg/dL (ref >39.00)
Total CHOL/HDL Ratio: 5
VLDL: 18.2 mg/dL (ref 0.0–40.0)

## 2012-08-30 LAB — LDL CHOLESTEROL, DIRECT: Direct LDL: 196 mg/dL

## 2012-08-30 LAB — HEMOGLOBIN A1C: Hgb A1c MFr Bld: 6.6 % — ABNORMAL HIGH (ref 4.6–6.5)

## 2012-08-30 NOTE — Telephone Encounter (Signed)
PT CAME  IN CHECKING ON HER RX FOR WELBUTRIN- [facility] [address] PT DECIDED TO START THIS

## 2012-08-31 ENCOUNTER — Telehealth: Payer: Self-pay | Admitting: Internal Medicine

## 2012-08-31 ENCOUNTER — Encounter: Payer: Self-pay | Admitting: Internal Medicine

## 2012-08-31 ENCOUNTER — Other Ambulatory Visit: Payer: Self-pay | Admitting: Internal Medicine

## 2012-08-31 MED ORDER — BUPROPION HCL 75 MG PO TABS: 75.0000 mg | ORAL_TABLET | Freq: Two times a day (BID) | ORAL | Status: DC

## 2012-08-31 NOTE — Telephone Encounter (Signed)
Patient called ,  rx sent

## 2012-08-31 NOTE — Telephone Encounter (Signed)
Per patient email, she left a message for nurse last Wednesday.  Please look into this,.  I will handle the prescription request

## 2012-08-31 NOTE — Telephone Encounter (Signed)
Not sure what she is referring to,  i have reviewed several notes and find no mention of wellbutrin . Please clarify, is patient

## 2012-11-08 LAB — HM MAMMOGRAPHY: HM Mammogram: NEGATIVE

## 2012-11-12 ENCOUNTER — Other Ambulatory Visit: Payer: Self-pay

## 2012-12-06 ENCOUNTER — Other Ambulatory Visit: Payer: Self-pay | Admitting: Internal Medicine

## 2012-12-06 NOTE — Telephone Encounter (Signed)
Pt seen 07/28/12. Med last filled 08/31/12 #60 with 2 refills.

## 2012-12-08 ENCOUNTER — Other Ambulatory Visit: Payer: Self-pay | Admitting: *Deleted

## 2012-12-08 MED ORDER — LEVOTHYROXINE SODIUM 100 MCG PO TABS: 100.0000 ug | ORAL_TABLET | Freq: Every day | ORAL | Status: DC

## 2012-12-08 NOTE — Telephone Encounter (Signed)
Med filled.  

## 2012-12-09 NOTE — Telephone Encounter (Signed)
Thank you for documenting! You did nothing wrong.  We can't please everybody!

## 2012-12-20 ENCOUNTER — Telehealth: Payer: Self-pay | Admitting: Hematology & Oncology

## 2012-12-20 NOTE — Telephone Encounter (Signed)
Patient called and cx 01/05/13 apt and resch for 01/19/13

## 2012-12-21 ENCOUNTER — Ambulatory Visit: Payer: Self-pay | Admitting: Ophthalmology

## 2012-12-22 ENCOUNTER — Ambulatory Visit: Payer: Self-pay | Admitting: Ophthalmology

## 2012-12-30 ENCOUNTER — Ambulatory Visit (INDEPENDENT_AMBULATORY_CARE_PROVIDER_SITE_OTHER): Payer: Medicare Other | Admitting: Internal Medicine

## 2012-12-30 ENCOUNTER — Encounter: Payer: Self-pay | Admitting: Internal Medicine

## 2012-12-30 VITALS — BP 110/78 | HR 102 | Temp 99.9°F | Resp 18 | Wt 172.5 lb

## 2012-12-30 DIAGNOSIS — E119 Type 2 diabetes mellitus without complications: Secondary | ICD-10-CM

## 2012-12-30 DIAGNOSIS — E039 Hypothyroidism, unspecified: Secondary | ICD-10-CM

## 2012-12-30 DIAGNOSIS — J019 Acute sinusitis, unspecified: Secondary | ICD-10-CM

## 2012-12-30 DIAGNOSIS — J111 Influenza due to unidentified influenza virus with other respiratory manifestations: Secondary | ICD-10-CM

## 2012-12-30 DIAGNOSIS — R6889 Other general symptoms and signs: Secondary | ICD-10-CM

## 2012-12-30 LAB — POCT INFLUENZA A/B: Influenza A, POC: NEGATIVE

## 2012-12-30 MED ORDER — LEVOFLOXACIN 500 MG PO TABS: 500.0000 mg | ORAL_TABLET | Freq: Every day | ORAL | Status: DC

## 2012-12-30 NOTE — Patient Instructions (Addendum)
I am calling a 7 day course of Levaquin  To start tonight with dinner  Please take your probiotic daily!!   Try delsym  for cough  keep flushing sinuses with Simply Saline   Continue simply saline flushes throughout the pollen season  To remove pollen from nose

## 2012-12-30 NOTE — Progress Notes (Signed)
Patient ID: Jacqueline Mata, female   DOB: [birth year], 72 y.o.   MRN: [id number]  Patient Active Problem List  Diagnosis  . Diabetes mellitus type 2, diet-controlled  . Hyperlipidemia  . Hypothyroidism  . Breast cancer  . Balance problem due to vestibular dysfunction  . Status post total knee replacement  . Sinusitis, acute    Subjective:  CC:   Chief Complaint  Patient presents with  . Cough    Runny nose fever aches and pains X 2 days sputum yellow throat scratchy and sore    HPI:   Jacqueline Mata a 72 y.o. female who presents Upper respiratory infection.  Two days prior to presentation she  developed diffuse aches and temp of 100.5  after a 3 day history of rhinitis symptoms including rhinitis and sneezing. She took some Aleve for the fever and Protonix one with a normal temperature however by yesterday afternoon she spiked a fever of 101.8 accompanied by a frontal headache. She is has several sick contacts including her for worsening her inquiry was diagnosed with pneumonia and several others McCoy who are sick with upper respiratory infections. She's been using Aleve is a lasting simply saline and Mucinex with no improvement in symptoms. She is having cataract surgery next Wednesday and is concerned about her surgery being delayed by infection.      Past Medical History  Diagnosis Date  . Diabetes mellitus   . Hyperlipidemia   . hypothyroidism   . breast cancer     Past Surgical History  Procedure Laterality Date  . Joint replacement  2013    by Dr. Ernest Pine     The following portions of the patient's history were reviewed and updated as appropriate: Allergies, current medications, and problem list.    Review of Systems:   Patient denies unintentional weight loss, skin rash, eye pain, dysphagia,  hemoptysis , cough,  chest pain, palpitations, orthopnea, edema, abdominal pain, nausea, melena, diarrhea, constipation, flank pain, dysuria, hematuria, urinary   Frequency, nocturia, numbness, tingling, seizures,  Focal weakness, Loss of consciousness,  Tremor, insomnia, depression, anxiety, and suicidal ideation.       History   Social History  . Marital Status: Single    Spouse Name: N/A    Number of Children: N/A  . Years of Education: N/A   Occupational History  . Not on file.   Social History Main Topics  . Smoking status: Former Smoker    Quit date: 07/22/1978  . Smokeless tobacco: Never Used  . Alcohol Use: Yes     Comment: rare  . Drug Use: No  . Sexually Active: Not on file   Other Topics Concern  . Not on file   Social History Narrative  . No narrative on file    Objective:  BP 110/78  Pulse 102  Temp(Src) 99.9 F (37.7 C) (Oral)  Resp 18  Wt 172 lb 8 oz (78.245 kg)  BMI 28.26 kg/m2  SpO2 98%  General appearance: alert, cooperative and appears stated age Ears: normal TM's and external ear canals both ears Throat: lips, mucosa, and tongue normal; teeth and gums normal Neck: no adenopathy, no carotid bruit, supple, symmetrical, trachea midline and thyroid not enlarged, symmetric, no tenderness/mass/nodules Back: symmetric, no curvature. ROM normal. No CVA tenderness. Lungs: clear to auscultation bilaterally Heart: regular rate and rhythm, S1, S2 normal, no murmur, click, rub or gallop Abdomen: soft, non-tender; bowel sounds normal; no masses,  no organomegaly Pulses: 2+ and symmetric Skin: Skin color, texture,  turgor normal. No rashes or lesions Lymph nodes: Cervical, supraclavicular, and axillary nodes normal.  Assessment and Plan:  Sinusitis, acute Given chronicity of symptoms, development of facial pain and exam consistent with bacterial URI,  Will treat with empiric antibiotics, decongestants, and saline lavage.     Updated Medication List Outpatient Encounter Prescriptions as of 12/30/2012  Medication Sig Dispense Refill  . aspirin 81 MG tablet Take 81 mg by mouth daily.        Marland Kitchen azelastine (ASTELIN)  137 MCG/SPRAY nasal spray Place 2 sprays into the nose every morning. Use in each nostril as directed      . cholecalciferol (VITAMIN D) 1000 UNITS tablet Take 1,000 Units by mouth daily.        . fluocinonide cream (LIDEX) 0.05 % Apply topically as needed.      . folic acid (FOLVITE) 1 MG tablet Take 1 tablet (1 mg total) by mouth daily.  90 tablet  1  . levothyroxine (SYNTHROID, LEVOTHROID) 100 MCG tablet Take 1 tablet (100 mcg total) by mouth daily. 100 mg one day alt with 88 mg the next day.  30 tablet  3  . levothyroxine (SYNTHROID, LEVOTHROID) 88 MCG tablet Take 1 tablet (88 mcg total) by mouth daily.  30 tablet  6  . metroNIDAZOLE (METROGEL) 1 % gel Apply 1 application topically daily.      . TDaP (BOOSTRIX) 5-2.5-18.5 LF-MCG/0.5 injection Inject 0.5 mLs into the muscle once.  0.5 mL  0  . acetaminophen (TYLENOL) 500 MG tablet Take 500 mg by mouth 2 (two) times daily.      Marland Kitchen levofloxacin (LEVAQUIN) 500 MG tablet Take 1 tablet (500 mg total) by mouth daily.  7 tablet  0  . [DISCONTINUED] buPROPion (WELLBUTRIN) 75 MG tablet TAKE 1 TABLET TWICE A DAY  60 tablet  2   No facility-administered encounter medications on file as of 12/30/2012.     Orders Placed This Encounter  Procedures  . Hemoglobin A1c  . TSH  . Comprehensive metabolic panel  . POCT Influenza A/B    No Follow-up on file.

## 2012-12-31 ENCOUNTER — Encounter: Payer: Self-pay | Admitting: Internal Medicine

## 2012-12-31 LAB — HEMOGLOBIN A1C
Hgb A1c MFr Bld: 6.3 % — ABNORMAL HIGH (ref <5.7)
Mean Plasma Glucose: 134 mg/dL — ABNORMAL HIGH (ref <117)

## 2012-12-31 LAB — COMPREHENSIVE METABOLIC PANEL
AST: 28 U/L (ref 0–37)
Alkaline Phosphatase: 63 U/L (ref 39–117)
BUN: 13 mg/dL (ref 6–23)
Calcium: 9.5 mg/dL (ref 8.4–10.5)
Creat: 0.85 mg/dL (ref 0.50–1.10)

## 2013-01-01 DIAGNOSIS — J019 Acute sinusitis, unspecified: Secondary | ICD-10-CM | POA: Insufficient documentation

## 2013-01-01 DIAGNOSIS — J0140 Acute pansinusitis, unspecified: Secondary | ICD-10-CM | POA: Insufficient documentation

## 2013-01-01 NOTE — Assessment & Plan Note (Signed)
Given chronicity of symptoms, development of facial pain and exam consistent with bacterial URI,  Will treat with empiric antibiotics, decongestants, and saline lavage.   

## 2013-01-04 ENCOUNTER — Ambulatory Visit: Payer: Self-pay | Admitting: Ophthalmology

## 2013-01-04 ENCOUNTER — Telehealth: Payer: Self-pay | Admitting: Internal Medicine

## 2013-01-05 ENCOUNTER — Other Ambulatory Visit: Payer: Medicare Other | Admitting: Lab

## 2013-01-05 ENCOUNTER — Ambulatory Visit: Payer: Medicare Other | Admitting: Hematology & Oncology

## 2013-01-19 ENCOUNTER — Ambulatory Visit: Payer: Medicare Other | Admitting: Hematology & Oncology

## 2013-01-19 ENCOUNTER — Other Ambulatory Visit (HOSPITAL_BASED_OUTPATIENT_CLINIC_OR_DEPARTMENT_OTHER): Payer: Medicare Other | Admitting: Lab

## 2013-01-19 ENCOUNTER — Other Ambulatory Visit: Payer: Medicare Other | Admitting: Lab

## 2013-01-19 ENCOUNTER — Other Ambulatory Visit: Payer: Self-pay | Admitting: Medical

## 2013-01-19 ENCOUNTER — Ambulatory Visit (HOSPITAL_BASED_OUTPATIENT_CLINIC_OR_DEPARTMENT_OTHER): Payer: Medicare Other | Admitting: Medical

## 2013-01-19 ENCOUNTER — Telehealth: Payer: Self-pay | Admitting: Hematology & Oncology

## 2013-01-19 VITALS — BP 111/99 | HR 75 | Temp 97.5°F | Resp 16 | Ht 65.0 in | Wt 175.0 lb

## 2013-01-19 DIAGNOSIS — Z170 Estrogen receptor positive status [ER+]: Secondary | ICD-10-CM

## 2013-01-19 DIAGNOSIS — C50919 Malignant neoplasm of unspecified site of unspecified female breast: Secondary | ICD-10-CM

## 2013-01-19 DIAGNOSIS — Z853 Personal history of malignant neoplasm of breast: Secondary | ICD-10-CM

## 2013-01-19 DIAGNOSIS — E559 Vitamin D deficiency, unspecified: Secondary | ICD-10-CM | POA: Diagnosis not present

## 2013-01-19 LAB — CBC WITH DIFFERENTIAL (CANCER CENTER ONLY)
BASO#: 0 10*3/uL (ref 0.0–0.2)
EOS%: 4.1 % (ref 0.0–7.0)
Eosinophils Absolute: 0.3 10*3/uL (ref 0.0–0.5)
HCT: 39.4 % (ref 34.8–46.6)
HGB: 13.3 g/dL (ref 11.6–15.9)
LYMPH#: 2.4 10*3/uL (ref 0.9–3.3)
MCH: 30.9 pg (ref 26.0–34.0)
MCHC: 33.8 g/dL (ref 32.0–36.0)
NEUT%: 56.3 % (ref 39.6–80.0)

## 2013-01-19 NOTE — Telephone Encounter (Signed)
Pt aware of 5-28 11am mammogram at [facility]. Faxed order to Kim at [phone]

## 2013-01-19 NOTE — Progress Notes (Signed)
DIAGNOSIS:  Bilateral stage IIA (T2 N0 M0) ductal carcinoma of the breast.  CURRENT THERAPY:  Observation.  INTERIM HISTORY:   Mr. Iannello presents today for an office followup visit.  Overall, she, reports, that she's doing quite well.  She's not reporting any new problems or complaints.  She gets around quite well.  She is due for bilateral mammogram.  We will make sure to get that set up.  We continue to see her every 6 months.  She's not reported any new problems.  Bilaterally with her breast.  She's not reporting any type of bony, pain.  She has a good appetite.  She denies any nausea, vomiting, diarrhea, constipation, chest pain, shortness of breath, cough, fevers, chills, or night sweats.  She denies any abdominal pain, any lower leg swelling, any, headaches, vision, changes, or rashes.  She denies any obvious, or abnormal bleeding.  Review of Systems: Constitutional:Negative for malaise/fatigue, fever, chills, weight loss, diaphoresis, activity change, appetite change, and unexpected weight change.  HEENT: Negative for double vision, blurred vision, visual loss, ear pain, tinnitus, congestion, rhinorrhea, epistaxis sore throat or sinus disease, oral pain/lesion, tongue soreness Respiratory: Negative for cough, chest tightness, shortness of breath, wheezing and stridor.  Cardiovascular: Negative for chest pain, palpitations, leg swelling, orthopnea, PND, DOE or claudication Gastrointestinal: Negative for nausea, vomiting, abdominal pain, diarrhea, constipation, blood in stool, melena, hematochezia, abdominal distention, anal bleeding, rectal pain, anorexia and hematemesis.  Genitourinary: Negative for dysuria, frequency, hematuria,  Musculoskeletal: Negative for myalgias, back pain, joint swelling, arthralgias and gait problem.  Skin: Negative for rash, color change, pallor and wound.  Neurological:. Negative for dizziness/light-headedness, tremors, seizures, syncope, facial asymmetry, speech  difficulty, weakness, numbness, headaches and paresthesias.  Hematological: Negative for adenopathy. Does not bruise/bleed easily.  Psychiatric/Behavioral:  Negative for depression, no loss of interest in normal activity or change in sleep pattern.   Physical Exam: This is a pleasant, 72 year old, well-developed, well-nourished, elderly, white female, in no obvious distress Vitals: Temperature 97.5 degrees, pulse 75, respirations 16, blood pressure 111/99, weight 175 pounds HEENT reveals a normocephalic, atraumatic skull, no scleral icterus, no oral lesions  Neck is supple without any cervical or supraclavicular adenopathy.  Lungs are clear to auscultation bilaterally. There are no wheezes, rales or rhonci Cardiac is regular rate and rhythm with a normal S1 and S2. There are no murmurs, rubs, or bruits.  Abdomen is soft with good bowel sounds, there is no palpable mass. There is no palpable hepatosplenomegaly. There is no palpable fluid wave.  Musculoskeletal no tenderness of the spine, ribs, or hips.  Extremities there are no clubbing, cyanosis, or edema.  Skin no petechia, purpura or ecchymosis Neurologic is nonfocal. Breasts: Right breast, with a well healed lumpectomy at the 4:00 position.  There is some slight contraction of the right breast.  No distinct mass is noted in the right breast.  There is no right axillary adenopathy.  Left breast shows a well healed lumpectomy at the 9:00 position.  No masses noted in the left breast.  There is no erythema or nodularity of the left breast.  There is no left axillary adenopathy.  Laboratory Data: White count 7.5, hemoglobin 13.3, hematocrit 39.4, platelets 160,000  Current Outpatient Prescriptions on File Prior to Visit  Medication Sig Dispense Refill  . acetaminophen (TYLENOL) 500 MG tablet Take 500 mg by mouth 2 (two) times daily.      Marland Kitchen aspirin 81 MG tablet Take 81 mg by mouth daily.        Marland Kitchen  azelastine (ASTELIN) 137 MCG/SPRAY nasal spray  Place 2 sprays into the nose every morning. Use in each nostril as directed      . cholecalciferol (VITAMIN D) 1000 UNITS tablet Take 1,000 Units by mouth daily.        . fluocinonide cream (LIDEX) 0.05 % Apply topically as needed.      . folic acid (FOLVITE) 1 MG tablet Take 1 tablet (1 mg total) by mouth daily.  90 tablet  1  . levofloxacin (LEVAQUIN) 500 MG tablet Take 1 tablet (500 mg total) by mouth daily.  7 tablet  0  . levothyroxine (SYNTHROID, LEVOTHROID) 100 MCG tablet Take 1 tablet (100 mcg total) by mouth daily. 100 mg one day alt with 88 mg the next day.  30 tablet  3  . levothyroxine (SYNTHROID, LEVOTHROID) 88 MCG tablet Take 1 tablet (88 mcg total) by mouth daily.  30 tablet  6  . metroNIDAZOLE (METROGEL) 1 % gel Apply 1 application topically daily.      . TDaP (BOOSTRIX) 5-2.5-18.5 LF-MCG/0.5 injection Inject 0.5 mLs into the muscle once.  0.5 mL  0   No current facility-administered medications on file prior to visit.   Assessment/Plan: This is a pleasant, 72 year old, white female, with the following issues:  #1.  Bilateral stage II A. ductal carcinoma.  She was node negative.  Her tumor was ER positive.  She did receive adjuvant chemotherapy.  She is now out 8 years.  She completed treatments back in November 2005.  She's doing well without any evidence of recurrence.  #2.  Surveillance.  She is due for bilateral mammogram.  She gets this done at [facility], with the [facility] breast care center.  We will go ahead and get that set up for her.  #3.  Followup.  We will follow back up with Ms. Letendre in 6 months, but before then should there be questions or concerns.

## 2013-01-20 LAB — COMPREHENSIVE METABOLIC PANEL
AST: 16 U/L (ref 0–37)
BUN: 16 mg/dL (ref 6–23)
CO2: 22 mEq/L (ref 19–32)
Calcium: 9.5 mg/dL (ref 8.4–10.5)
Chloride: 106 mEq/L (ref 96–112)
Creatinine, Ser: 0.85 mg/dL (ref 0.50–1.10)
Glucose, Bld: 78 mg/dL (ref 70–99)

## 2013-01-20 LAB — VITAMIN D 25 HYDROXY (VIT D DEFICIENCY, FRACTURES): Vit D, 25-Hydroxy: 41 ng/mL (ref 30–89)

## 2013-01-26 ENCOUNTER — Encounter: Payer: Self-pay | Admitting: *Deleted

## 2013-02-05 LAB — HM MAMMOGRAPHY

## 2013-02-05 LAB — HM DIABETES EYE EXAM

## 2013-02-21 ENCOUNTER — Other Ambulatory Visit: Payer: Self-pay | Admitting: *Deleted

## 2013-02-21 ENCOUNTER — Telehealth: Payer: Self-pay | Admitting: Hematology & Oncology

## 2013-02-21 DIAGNOSIS — C50919 Malignant neoplasm of unspecified site of unspecified female breast: Secondary | ICD-10-CM

## 2013-02-21 NOTE — Progress Notes (Signed)
Received a call from [place] from the [facility]. She needs to have a new order for the pt's mammogram. It needs to be for a diagnostic instead of a screening due to her history of breast cancer. Order placed in EMR.

## 2013-02-21 NOTE — Telephone Encounter (Signed)
Received call from [facility] at [facility] they did not get order for mammogram. Re-faxed order today.

## 2013-02-22 ENCOUNTER — Ambulatory Visit: Payer: Self-pay | Admitting: Hematology & Oncology

## 2013-02-22 ENCOUNTER — Telehealth: Payer: Self-pay | Admitting: Hematology & Oncology

## 2013-02-22 NOTE — Telephone Encounter (Signed)
Faxed new order

## 2013-02-23 ENCOUNTER — Telehealth: Payer: Self-pay | Admitting: Hematology & Oncology

## 2013-02-23 NOTE — Telephone Encounter (Signed)
Pt moved 10-30 to 10-29

## 2013-03-01 ENCOUNTER — Ambulatory Visit: Payer: Self-pay | Admitting: Ophthalmology

## 2013-03-10 ENCOUNTER — Ambulatory Visit (INDEPENDENT_AMBULATORY_CARE_PROVIDER_SITE_OTHER): Payer: Medicare Other | Admitting: Adult Health

## 2013-03-10 ENCOUNTER — Encounter: Payer: Self-pay | Admitting: Adult Health

## 2013-03-10 VITALS — BP 108/68 | HR 78 | Temp 98.3°F | Resp 12 | Wt 175.5 lb

## 2013-03-10 DIAGNOSIS — L237 Allergic contact dermatitis due to plants, except food: Secondary | ICD-10-CM | POA: Insufficient documentation

## 2013-03-10 DIAGNOSIS — L255 Unspecified contact dermatitis due to plants, except food: Secondary | ICD-10-CM

## 2013-03-10 MED ORDER — TRIAMCINOLONE ACETONIDE 0.1 % EX CREA: TOPICAL_CREAM | Freq: Two times a day (BID) | CUTANEOUS | Status: DC

## 2013-03-10 NOTE — Patient Instructions (Addendum)
  Start triamcinolone 0.1% cream to the affected area twice daily.  Keep area clean. Use mild soap such as ivory or dove.  Try not to scratch the area.  Benadryl at bedtime.

## 2013-03-10 NOTE — Progress Notes (Signed)
  Subjective:    Patient ID: Jacqueline Mata, female    DOB: 04-08-1941, 72 y.o.   MRN: [id number]  HPI  Patient is a pleasant 72 year old female who presents to clinic with a rash on the right forearm. She reports working out in the yard approximately one week ago when her symptoms began shortly after. The area is itchy.  Review of Systems  Skin: Positive for rash.       Right forearm     BP 108/68  Pulse 78  Temp(Src) 98.3 F (36.8 C) (Oral)  Resp 12  Wt 175 lb 8 oz (79.606 kg)  BMI 29.2 kg/m2  SpO2 96%    Objective:   Physical Exam  Constitutional: She is oriented to person, place, and time.  Neurological: She is alert and oriented to person, place, and time.  Skin: Rash noted. There is erythema.  intermittent dermatitis of the right arm after working outside in the yard  Psychiatric: She has a normal mood and affect. Her behavior is normal. Judgment and thought content normal.          Assessment & Plan:

## 2013-03-10 NOTE — Assessment & Plan Note (Signed)
Symptoms consistent with exposure to poison ivy/oak. Start triamcinolone cream to the affected area twice a day. Wash area with mild soap and water.

## 2013-03-17 ENCOUNTER — Telehealth: Payer: Self-pay | Admitting: *Deleted

## 2013-03-17 ENCOUNTER — Telehealth: Payer: Self-pay | Admitting: Internal Medicine

## 2013-03-17 MED ORDER — GLUCOSE BLOOD VI STRP: ORAL_STRIP | Status: DC

## 2013-03-17 NOTE — Telephone Encounter (Signed)
Refill Request  Acc-Chek Aviva Test Strips  #100  Check blood sugar 1 to 2 times daily

## 2013-03-17 NOTE — Telephone Encounter (Signed)
[facility] . Please resend or call.

## 2013-03-17 NOTE — Telephone Encounter (Signed)
Test strips refilled

## 2013-03-17 NOTE — Telephone Encounter (Signed)
Script sent with DX code.

## 2013-03-17 NOTE — Telephone Encounter (Signed)
[facility]armacist from [facility] called inform the office the dx code has to be on the rx refill either electronic or by paper fax.

## 2013-03-17 NOTE — Addendum Note (Signed)
Addended by: Dennie Bible on: 03/17/2013 03:59 PM   Modules accepted: Orders

## 2013-03-20 NOTE — Telephone Encounter (Signed)
Recent script

## 2013-03-20 NOTE — Addendum Note (Signed)
Addended by: Dennie Bible on: 03/20/2013 03:10 PM   Modules accepted: Orders

## 2013-03-22 ENCOUNTER — Telehealth: Payer: Self-pay | Admitting: Internal Medicine

## 2013-03-22 MED ORDER — GLUCOSE BLOOD VI STRP: ORAL_STRIP | Status: DC

## 2013-03-22 NOTE — Telephone Encounter (Signed)
Pharmacy needing a Dx. Code for the patient's test strips in order for medicaid to pay.

## 2013-03-23 NOTE — Telephone Encounter (Signed)
Recent with code.

## 2013-03-24 ENCOUNTER — Telehealth: Payer: Self-pay | Admitting: *Deleted

## 2013-03-24 NOTE — Telephone Encounter (Signed)
Called [phone] to do prior authorization on the Accu-Chek Aviva test strips, they asked if there is a reason why she has to have Accu-Chek? Pt insurance covers One touch

## 2013-03-24 NOTE — Telephone Encounter (Signed)
No, for all patients with diabetes:  twhatever one their insurance will cover.  Call in one touch test strips, assuming she has a one touch glucometer.

## 2013-03-29 ENCOUNTER — Telehealth: Payer: Self-pay | Admitting: Internal Medicine

## 2013-03-29 MED ORDER — GLUCOSE BLOOD VI STRP: ORAL_STRIP | Status: DC

## 2013-03-29 NOTE — Telephone Encounter (Signed)
Patient needs a hard copy for her prescription for  Accu-Chek Aviva test strips because of medicare prescription changes per the pharmacist. She is wanting a call when the prescription is ready.

## 2013-03-29 NOTE — Telephone Encounter (Signed)
Script printed waiting for signature.

## 2013-03-29 NOTE — Telephone Encounter (Signed)
pritn out and I will sign

## 2013-03-29 NOTE — Telephone Encounter (Signed)
Placed up front for pickup ° °

## 2013-03-30 ENCOUNTER — Other Ambulatory Visit: Payer: Self-pay | Admitting: Internal Medicine

## 2013-03-30 ENCOUNTER — Telehealth: Payer: Self-pay | Admitting: *Deleted

## 2013-03-30 MED ORDER — GLUCOSE BLOOD VI STRP: ORAL_STRIP | Status: DC

## 2013-03-30 MED ORDER — ONETOUCH ULTRA SYSTEM W/DEVICE KIT: 1.0000 | PACK | Freq: Once | Status: DC

## 2013-03-30 MED ORDER — LIFESCAN UNISTIK 2 MISC: Status: DC

## 2013-03-30 NOTE — Telephone Encounter (Signed)
Pt called asking about her PA for Accu-chek, I explained to her that they printed out a Rx for the One-touch since that what the insurance would pay for, pt said that was fine but that she would need an Rx for the Meter kit as well

## 2013-03-30 NOTE — Telephone Encounter (Signed)
Pt called back and states that when she looked at her Rx for the strips it was accu-chek and not One-touch and that she would need a new Rx for the strips as One-touch to [facility] on [address] And a rx for the meter kit as well

## 2013-04-10 ENCOUNTER — Telehealth: Payer: Self-pay | Admitting: Internal Medicine

## 2013-04-10 MED ORDER — ONETOUCH DELICA LANCETS 33G MISC: 1.0000 | Freq: Three times a day (TID) | Status: DC

## 2013-04-10 NOTE — Telephone Encounter (Signed)
Delica Lancets box of 100  Patient test 3 times a day./

## 2013-04-10 NOTE — Telephone Encounter (Signed)
Rx printed to be signed  

## 2013-04-11 NOTE — Telephone Encounter (Signed)
Rx faxed to pharmacy  

## 2013-07-04 ENCOUNTER — Other Ambulatory Visit: Payer: Self-pay | Admitting: *Deleted

## 2013-07-04 MED ORDER — LEVOTHYROXINE SODIUM 100 MCG PO TABS: 100.0000 ug | ORAL_TABLET | Freq: Every day | ORAL | Status: DC

## 2013-07-20 ENCOUNTER — Ambulatory Visit: Payer: Medicare Other | Admitting: Hematology & Oncology

## 2013-07-20 ENCOUNTER — Other Ambulatory Visit: Payer: Medicare Other | Admitting: Lab

## 2013-07-26 ENCOUNTER — Other Ambulatory Visit: Payer: Medicare Other | Admitting: Lab

## 2013-07-26 ENCOUNTER — Ambulatory Visit: Payer: Medicare Other | Admitting: Hematology & Oncology

## 2013-07-27 ENCOUNTER — Ambulatory Visit: Payer: Medicare Other | Admitting: Hematology & Oncology

## 2013-07-27 ENCOUNTER — Other Ambulatory Visit: Payer: Medicare Other | Admitting: Lab

## 2013-07-28 ENCOUNTER — Ambulatory Visit (INDEPENDENT_AMBULATORY_CARE_PROVIDER_SITE_OTHER): Payer: Medicare Other | Admitting: *Deleted

## 2013-07-28 DIAGNOSIS — Z23 Encounter for immunization: Secondary | ICD-10-CM

## 2013-08-02 ENCOUNTER — Ambulatory Visit: Payer: Medicare Other | Admitting: Hematology & Oncology

## 2013-08-02 ENCOUNTER — Other Ambulatory Visit: Payer: Medicare Other | Admitting: Lab

## 2013-08-07 ENCOUNTER — Encounter: Payer: Self-pay | Admitting: Internal Medicine

## 2013-08-07 ENCOUNTER — Ambulatory Visit (INDEPENDENT_AMBULATORY_CARE_PROVIDER_SITE_OTHER): Payer: Medicare Other | Admitting: Internal Medicine

## 2013-08-07 VITALS — BP 112/72 | HR 80 | Temp 98.1°F | Resp 12 | Ht 64.5 in | Wt 176.8 lb

## 2013-08-07 DIAGNOSIS — E119 Type 2 diabetes mellitus without complications: Secondary | ICD-10-CM

## 2013-08-07 DIAGNOSIS — C50911 Malignant neoplasm of unspecified site of right female breast: Secondary | ICD-10-CM

## 2013-08-07 DIAGNOSIS — E785 Hyperlipidemia, unspecified: Secondary | ICD-10-CM

## 2013-08-07 DIAGNOSIS — Z789 Other specified health status: Secondary | ICD-10-CM | POA: Insufficient documentation

## 2013-08-07 DIAGNOSIS — Z888 Allergy status to other drugs, medicaments and biological substances status: Secondary | ICD-10-CM

## 2013-08-07 DIAGNOSIS — Z9849 Cataract extraction status, unspecified eye: Secondary | ICD-10-CM

## 2013-08-07 DIAGNOSIS — Z6825 Body mass index (BMI) 25.0-25.9, adult: Secondary | ICD-10-CM

## 2013-08-07 DIAGNOSIS — Z9841 Cataract extraction status, right eye: Secondary | ICD-10-CM

## 2013-08-07 DIAGNOSIS — E039 Hypothyroidism, unspecified: Secondary | ICD-10-CM

## 2013-08-07 DIAGNOSIS — C50919 Malignant neoplasm of unspecified site of unspecified female breast: Secondary | ICD-10-CM

## 2013-08-07 DIAGNOSIS — E663 Overweight: Secondary | ICD-10-CM

## 2013-08-07 NOTE — Patient Instructions (Signed)
You are due for fasting lipids and an A1c.  Please make appt to return

## 2013-08-07 NOTE — Progress Notes (Signed)
Patient ID: Jacqueline Mata, female   DOB: 1940/10/03, 71 y.o.   MRN: [id number]  Patient Active Problem List   Diagnosis Date Noted  . S/P bilateral cataract extraction 08/08/2013  . Overweight (BMI 25.0-29.9) 08/08/2013  . Statin intolerance 08/07/2013  . Poison ivy dermatitis 03/10/2013  . Sinusitis, acute 01/01/2013  . Balance problem due to vestibular dysfunction 05/01/2012  . Status post total knee replacement 05/01/2012  . Breast cancer in situ 11/09/2011  . Hypothyroidism 07/26/2011  . Hyperlipidemia   . Diabetes mellitus type 2, diet-controlled 07/23/2011    Subjective:  CC:   Chief Complaint  Patient presents with  . Follow-up    7 months    HPI:   Jacqueline Mata a 72 y.o. female who presents for  6 month follow up on chronic medical conditions including hyperlipidemia,  diet controlled diabetes mellitus, and obesity . Cataracts removed April and May 2014.  Developed reaction to Alevro eye drop the day prior to surgery Left eye started swelling,  But surgery was not postponed ([place]). Suffered a corneal abrasion which was  noted but not treated bc of her neomycin allergy.  {Post procedure,  Had severe pain fro mputting in the nighttime eye drops,  Which finally  Resolved after several days.  Vision returned to normal after several weeks .  2nd experience , right eye had none of the complications   DM:  fasting sugars have all been  <  120 per log brought in today..   Obesity: Has lost 7 lbs by cutting out starches .  Not exercising regularly despite  having a knee replacement.        Past Medical History  Diagnosis Date  . Diabetes mellitus   . Hyperlipidemia   . hypothyroidism   . breast cancer   . Cataract 01/04/13 and 03/01/13    Past Surgical History  Procedure Laterality Date  . Joint replacement  2013    by Dr. Ernest Pine       The following portions of the patient's history were reviewed and updated as appropriate: Allergies, current medications,  and problem list.    Review of Systems:   12 Pt  review of systems was negative except those addressed in the HPI,     History   Social History  . Marital Status: Single    Spouse Name: N/A    Number of Children: N/A  . Years of Education: N/A   Occupational History  . Not on file.   Social History Main Topics  . Smoking status: Former Smoker    Quit date: 07/22/1978  . Smokeless tobacco: Never Used  . Alcohol Use: Yes     Comment: rare  . Drug Use: No  . Sexual Activity: Not on file   Other Topics Concern  . Not on file   Social History Narrative  . No narrative on file    Objective:  Filed Vitals:   08/07/13 1406  BP: 112/72  Pulse: 80  Temp: 98.1 F (36.7 C)  Resp: 12     General appearance: alert, cooperative and appears stated age Ears: normal TM's and external ear canals both ears Throat: lips, mucosa, and tongue normal; teeth and gums normal Neck: no adenopathy, no carotid bruit, supple, symmetrical, trachea midline and thyroid not enlarged, symmetric, no tenderness/mass/nodules Back: symmetric, no curvature. ROM normal. No CVA tenderness. Lungs: clear to auscultation bilaterally Heart: regular rate and rhythm, S1, S2 normal, no murmur, click, rub or gallop Abdomen: soft, non-tender; bowel  sounds normal; no masses,  no organomegaly Pulses: 2+ and symmetric Skin: Skin color, texture, turgor normal. No rashes or lesions Lymph nodes: Cervical, supraclavicular, and axillary nodes normal.  Assessment and Plan:  Breast cancer in situ Surveillance is ongoing managed by Dr . Orlie Dakin,  Last mammogram may 2014.    S/P bilateral cataract extraction The left eye surgery by Dr. Haskel Khan was complicated by a reaction to the preoperative eye drops and a corneal abrasion which caused considerable pain post operatively .,  Right eye extreaction was uncomplicated.  Vision is improving   Statin intolerance Discussed possibility or retrial of statins  pending repeat lipid analysis  Hyperlipidemia Untreated secondary to statin intolerance.  However, given her concurrent diabetes she is willing to reconsider a trial if her risk of CAD is high enough   Diabetes mellitus type 2, diet-controlled She is due for 6 month A1c.  Has been historically well-controlled on diet alone .  hemoglobin A1c has been consistently less than 7.0 . Patient is up-to-date on eye exams and foot exam was done today.  There is  no proteinuria on today's micro urinalysis .  Fasting lipids have been ordered and statin therapy will be advised  If indicated according to new ACC guidelines based on patient's 10 year risk of CAD   Hypothyroidism Has been alternating between two doses of synthroid due to intolerance of higher dose until recently when she took the higher dose daily  fro a nonth due to excessive fatigue.  Will repeat tsh and CBC with other labs   Overweight (BMI 25.0-29.9) I have addressed  BMI and recommended wt loss of 10% of body weigh over the next 6 months using a low glycemic index diet and regular exercise a minimum of 5 days per week.    A total of 40 minutes was spent with patient more than half of which was spent in counseling, reviewing records from other prviders and coordination of care.  Updated Medication List Outpatient Encounter Prescriptions as of 08/07/2013  Medication Sig  . acetaminophen (TYLENOL) 500 MG tablet Take 500 mg by mouth as needed.   Marland Kitchen aspirin 81 MG tablet Take 81 mg by mouth daily.    Marland Kitchen azelastine (ASTELIN) 137 MCG/SPRAY nasal spray Place 2 sprays into the nose as needed.   . Blood Glucose Monitoring Suppl (ONE TOUCH ULTRA SYSTEM KIT) W/DEVICE KIT 1 kit by Does not apply route once.  . cholecalciferol (VITAMIN D) 1000 UNITS tablet Take 1,000 Units by mouth daily.    . fluocinonide cream (LIDEX) 0.05 % Apply topically as needed.  . folic acid (FOLVITE) 1 MG tablet Take 1 tablet (1 mg total) by mouth daily.  Marland Kitchen glucose blood  test strip Test sugars three times daily  . levothyroxine (SYNTHROID, LEVOTHROID) 100 MCG tablet Take 1 tablet (100 mcg total) by mouth daily. 100 MCG ALT WITH 88 MCG & REPEAT,  . levothyroxine (SYNTHROID, LEVOTHROID) 88 MCG tablet Take 88 mcg by mouth daily before breakfast.  . metroNIDAZOLE (METROGEL) 1 % gel Apply 1 application topically daily.  Letta Pate DELICA LANCETS 33G MISC 1 each by Does not apply route 3 (three) times daily. Test blood sugars three times daily. Dx 250.00  . PRESCRIPTION MEDICATION 4 (four) times daily. durasol eye gtts  . [DISCONTINUED] moxifloxacin (VIGAMOX) 0.5 % ophthalmic solution Place 1 drop into the right eye 4 (four) times daily.  . [DISCONTINUED] triamcinolone cream (KENALOG) 0.1 % Apply topically 2 (two) times daily.  Orders Placed This Encounter  Procedures  . HM MAMMOGRAPHY  . HM MAMMOGRAPHY  . Lipid panel  . Hemoglobin A1c  . Microalbumin / creatinine urine ratio  . Comprehensive metabolic panel  . TSH  . HM PAP SMEAR  . HM DIABETES EYE EXAM  . HM DIABETES FOOT EXAM    No Follow-up on file.

## 2013-08-08 ENCOUNTER — Encounter: Payer: Self-pay | Admitting: Internal Medicine

## 2013-08-08 DIAGNOSIS — E663 Overweight: Secondary | ICD-10-CM | POA: Insufficient documentation

## 2013-08-08 DIAGNOSIS — Z9841 Cataract extraction status, right eye: Secondary | ICD-10-CM | POA: Insufficient documentation

## 2013-08-08 NOTE — Assessment & Plan Note (Signed)
Discussed possibility or retrial of statins pending repeat lipid analysis

## 2013-08-08 NOTE — Assessment & Plan Note (Addendum)
Surveillance is ongoing managed by Dr . Orlie Dakin,  Last mammogram may 2014.

## 2013-08-08 NOTE — Assessment & Plan Note (Signed)
The left eye surgery by Dr. Haskel Khan was complicated by a reaction to the preoperative eye drops and a corneal abrasion which caused considerable pain post operatively .,  Right eye extreaction was uncomplicated.  Vision is improving

## 2013-08-08 NOTE — Assessment & Plan Note (Signed)
She is due for 6 month A1c.  Has been historically well-controlled on diet alone .  hemoglobin A1c has been consistently less than 7.0 . Patient is up-to-date on eye exams and foot exam was done today.  There is  no proteinuria on today's micro urinalysis .  Fasting lipids have been ordered and statin therapy will be advised  If indicated according to new ACC guidelines based on patient's 10 year risk of CAD

## 2013-08-08 NOTE — Assessment & Plan Note (Signed)
I have addressed  BMI and recommended wt loss of 10% of body weigh over the next 6 months using a low glycemic index diet and regular exercise a minimum of 5 days per week.   

## 2013-08-08 NOTE — Assessment & Plan Note (Signed)
Has been alternating between two doses of synthroid due to intolerance of higher dose until recently when she took the higher dose daily  fro a nonth due to excessive fatigue.  Will repeat tsh and CBC with other labs

## 2013-08-08 NOTE — Assessment & Plan Note (Signed)
Untreated secondary to statin intolerance.  However, given her concurrent diabetes she is willing to reconsider a trial if her risk of CAD is high enough

## 2013-08-16 ENCOUNTER — Other Ambulatory Visit (INDEPENDENT_AMBULATORY_CARE_PROVIDER_SITE_OTHER): Payer: Medicare Other

## 2013-08-16 DIAGNOSIS — E119 Type 2 diabetes mellitus without complications: Secondary | ICD-10-CM

## 2013-08-16 DIAGNOSIS — E039 Hypothyroidism, unspecified: Secondary | ICD-10-CM

## 2013-08-16 DIAGNOSIS — E785 Hyperlipidemia, unspecified: Secondary | ICD-10-CM

## 2013-08-16 LAB — MICROALBUMIN / CREATININE URINE RATIO: Microalb, Ur: 1.4 mg/dL (ref 0.0–1.9)

## 2013-08-16 LAB — LIPID PANEL
Cholesterol: 232 mg/dL — ABNORMAL HIGH (ref 0–200)
HDL: 56.5 mg/dL (ref >39.00)
Triglycerides: 58 mg/dL (ref 0.0–149.0)
VLDL: 11.6 mg/dL (ref 0.0–40.0)

## 2013-08-16 LAB — COMPREHENSIVE METABOLIC PANEL
Albumin: 4.3 g/dL (ref 3.5–5.2)
Alkaline Phosphatase: 50 U/L (ref 39–117)
BUN: 13 mg/dL (ref 6–23)
CO2: 21 mEq/L (ref 19–32)
Calcium: 9.3 mg/dL (ref 8.4–10.5)
GFR: 77.24 mL/min (ref >60.00)
Glucose, Bld: 131 mg/dL — ABNORMAL HIGH (ref 70–99)
Potassium: 4.1 mEq/L (ref 3.5–5.1)
Total Bilirubin: 1.1 mg/dL (ref 0.3–1.2)
Total Protein: 6.8 g/dL (ref 6.0–8.3)

## 2013-08-16 LAB — TSH: TSH: 1.32 u[IU]/mL (ref 0.35–5.50)

## 2013-08-16 LAB — HEMOGLOBIN A1C: Hgb A1c MFr Bld: 6.4 % (ref 4.6–6.5)

## 2013-08-16 LAB — LDL CHOLESTEROL, DIRECT: Direct LDL: 168.8 mg/dL

## 2013-08-20 ENCOUNTER — Encounter: Payer: Self-pay | Admitting: Internal Medicine

## 2013-08-20 ENCOUNTER — Other Ambulatory Visit: Payer: Self-pay | Admitting: Internal Medicine

## 2013-08-20 MED ORDER — COENZYME Q10 30 MG PO CAPS: 30.0000 mg | ORAL_CAPSULE | Freq: Every day | ORAL | Status: DC

## 2013-08-20 MED ORDER — ATORVASTATIN CALCIUM 20 MG PO TABS: 20.0000 mg | ORAL_TABLET | Freq: Every day | ORAL | Status: DC

## 2013-08-21 ENCOUNTER — Other Ambulatory Visit: Payer: Self-pay | Admitting: *Deleted

## 2013-08-21 MED ORDER — LEVOTHYROXINE SODIUM 88 MCG PO TABS: 88.0000 ug | ORAL_TABLET | Freq: Every day | ORAL | Status: DC

## 2013-08-30 ENCOUNTER — Ambulatory Visit (HOSPITAL_BASED_OUTPATIENT_CLINIC_OR_DEPARTMENT_OTHER): Payer: Medicare Other | Admitting: Lab

## 2013-08-30 ENCOUNTER — Ambulatory Visit (HOSPITAL_BASED_OUTPATIENT_CLINIC_OR_DEPARTMENT_OTHER): Payer: Medicare Other | Admitting: Hematology & Oncology

## 2013-08-30 DIAGNOSIS — D0590 Unspecified type of carcinoma in situ of unspecified breast: Secondary | ICD-10-CM

## 2013-08-30 DIAGNOSIS — E559 Vitamin D deficiency, unspecified: Secondary | ICD-10-CM

## 2013-08-30 DIAGNOSIS — C50919 Malignant neoplasm of unspecified site of unspecified female breast: Secondary | ICD-10-CM | POA: Diagnosis not present

## 2013-08-30 DIAGNOSIS — Z853 Personal history of malignant neoplasm of breast: Secondary | ICD-10-CM

## 2013-08-30 LAB — CBC WITH DIFFERENTIAL (CANCER CENTER ONLY)
BASO#: 0 10*3/uL (ref 0.0–0.2)
EOS%: 3.3 % (ref 0.0–7.0)
Eosinophils Absolute: 0.2 10*3/uL (ref 0.0–0.5)
HGB: 14.3 g/dL (ref 11.6–15.9)
LYMPH#: 2.4 10*3/uL (ref 0.9–3.3)
MCH: 31.3 pg (ref 26.0–34.0)
MCHC: 34.6 g/dL (ref 32.0–36.0)
MONO%: 5.1 % (ref 0.0–13.0)
NEUT#: 4.2 10*3/uL (ref 1.5–6.5)
Platelets: 166 10*3/uL (ref 145–400)
RBC: 4.57 10*6/uL (ref 3.70–5.32)
WBC: 7.3 10*3/uL (ref 3.9–10.0)

## 2013-08-30 NOTE — Progress Notes (Signed)
This office note has been dictated.

## 2013-08-31 ENCOUNTER — Encounter: Payer: Self-pay | Admitting: Nurse Practitioner

## 2013-08-31 LAB — COMPREHENSIVE METABOLIC PANEL
AST: 24 U/L (ref 0–37)
Albumin: 4.3 g/dL (ref 3.5–5.2)
Alkaline Phosphatase: 56 U/L (ref 39–117)
BUN: 14 mg/dL (ref 6–23)
Calcium: 9.3 mg/dL (ref 8.4–10.5)
Chloride: 106 mEq/L (ref 96–112)
Potassium: 3.9 mEq/L (ref 3.5–5.3)
Sodium: 137 mEq/L (ref 135–145)
Total Bilirubin: 0.8 mg/dL (ref 0.3–1.2)
Total Protein: 7.1 g/dL (ref 6.0–8.3)

## 2013-08-31 NOTE — Progress Notes (Signed)
[facility] for breast prosthesis and bras. [phone])

## 2013-09-03 NOTE — Progress Notes (Signed)
DIAGNOSIS:  Synchronous bilateral stage IIA (T2 N0 M0) ductal carcinoma of bilateral breast.  CURRENT THERAPY:  Observation.  INTERVAL HISTORY:  Ms. Groll comes in for followup.  We saw her, I think, 6 months ago.  I think Kennith Center saw her at that point in time.  She has been doing fairly well.  Since we last saw her in April, she has had no real problems.  She is staying active.  She has had no fatigue or weakness.  She has had no bony pain.  There has been no cough or shortness of breath.  She has had no change in bowel or bladder habits.  She gets her mammograms routinely.  She had a lot of questions about her mammograms.  A lot of these questions I really cannot answer and I told her that she probably needs to go and talk to the radiologist when she has this done again.  She has had no change in her medications.  PHYSICAL EXAMINATION:  This is a well-developed, well-nourished white female in no obvious distress.  Vital signs:  Temperature of 98, pulse 80, respiratory rate 14, blood pressure 102/76.  Weight is 176 pounds. Head and neck:  Shows normocephalic, atraumatic skull.  There are no ocular or oral lesions.  There are no palpable cervical or supraclavicular lymph nodes.  Lungs:  Clear bilaterally.  She has no rales, wheezes, or rhonchi.  Cardiac:  Regular rate and rhythm with a normal S1, S2.  There are no murmurs, rubs, or bruits.  Breast:  Shows right breast with a well-healed lumpectomy at the 4 o'clock position. No distinct mass noted in the right breast.  She has no palpable right axillary adenopathy.  Left breast shows a well-healed lumpectomy at the 9 o'clock position.  There is no distinct mass in the left breast.  She has no left axillary adenopathy.  Abdomen:  Soft.  She has good bowel sounds.  There is no fluid wave.  There is no palpable hepatosplenomegaly.  Back:  No tenderness over the spine, ribs, or hips. Extremities:  Show no clubbing, cyanosis, or edema.   She has decent range of motion of her joints.  Neurological:  Shows no focal neurological deficits.  Skin:  No suspicious skin lesions are noted.  LABORATORY STUDIES:  White blood cell count is 7.3, hemoglobin 14.3, hematocrit 41.3, platelet count 166.  IMPRESSION:  Ms. Lowe is a very nice 72 year old white female.  She had a history of synchronous bilateral breast cancer.  They are both stage IIA.  They are both noted negative.  Her tumor or cancer was ER positive.  She did receive adjuvant chemotherapy.  She completed treatments back in November of 2005.  She was then placed on aromatase inhibitor.  Again, I do not see any evidence to suggest a recurrent disease.  For now, we will order the mammogram for her.  She will have this done I think in March or April.  I will plan to see her back afterwards.    ______________________________ Josph Macho, M.D. PRE/MEDQ  D:  08/30/2013  T:  09/03/2013  Job:  [id number]

## 2013-10-03 ENCOUNTER — Ambulatory Visit (INDEPENDENT_AMBULATORY_CARE_PROVIDER_SITE_OTHER): Payer: Medicare HMO | Admitting: Adult Health

## 2013-10-03 ENCOUNTER — Encounter: Payer: Self-pay | Admitting: Adult Health

## 2013-10-03 VITALS — BP 110/72 | HR 70 | Temp 98.2°F | Resp 12 | Wt 179.0 lb

## 2013-10-03 DIAGNOSIS — J019 Acute sinusitis, unspecified: Secondary | ICD-10-CM

## 2013-10-03 MED ORDER — FLUTICASONE PROPIONATE 50 MCG/ACT NA SUSP: 2.0000 | Freq: Every day | NASAL | Status: DC

## 2013-10-03 NOTE — Progress Notes (Signed)
   Subjective:    Patient ID: Jacqueline Mata, female    DOB: 1941-04-12, 73 y.o.   MRN: [id number]  HPI  Symptoms started ~ 1 month ago. She began to experience sinus congestion and pressure, nasal congestion. She reports she has been taking Mucinex. No symptoms in her chest reported. Reports no fever, chills. Symptoms are improving.    Current Outpatient Prescriptions on File Prior to Visit  Medication Sig Dispense Refill  . acetaminophen (TYLENOL) 500 MG tablet Take 500 mg by mouth as needed.       Marland Kitchen aspirin 81 MG tablet Take 81 mg by mouth daily.        Marland Kitchen azelastine (ASTELIN) 137 MCG/SPRAY nasal spray Place 1 spray into the nose as needed.       . Blood Glucose Monitoring Suppl (ONE TOUCH ULTRA SYSTEM KIT) W/DEVICE KIT 1 kit by Does not apply route once.  1 each  0  . cholecalciferol (VITAMIN D) 1000 UNITS tablet Take 1,000 Units by mouth daily.        . fluocinonide cream (LIDEX) 0.05 % Apply topically as needed.      . folic acid (FOLVITE) 1 MG tablet Take 1 tablet (1 mg total) by mouth daily.  90 tablet  1  . levothyroxine (SYNTHROID, LEVOTHROID) 100 MCG tablet Take 100 mcg by mouth daily before breakfast. 100 MCG ALT WITH 88 MCG THE NEXT DAY AND REPEAT.       No current facility-administered medications on file prior to visit.    Review of Systems Positive for sinus congestion, pressure, yellow drainage Negative for fever, chills, cough    Objective:   Physical Exam  Constitutional: She is oriented to person, place, and time. No distress.  Pulmonary/Chest: Effort normal. No respiratory distress.  Neurological: She is alert and oriented to person, place, and time.  Skin: Skin is warm and dry.  Psychiatric: She has a normal mood and affect. Her behavior is normal. Judgment and thought content normal.    BP 110/72  Pulse 70  Temp(Src) 98.2 F (36.8 C) (Oral)  Resp 12  Wt 179 lb (81.194 kg)  SpO2 97%       Assessment & Plan:

## 2013-10-03 NOTE — Assessment & Plan Note (Signed)
Sinus symptoms started approximately one month ago. Reports improvement in her symptoms. Add Flonase nasal spray 2 sprays into each nostril daily x2 weeks. May irrigate sinuses with simple saline. Cold mist humidifier may help sinus symptoms. RTC if symptoms are not improved within 4-5 days.

## 2013-10-03 NOTE — Patient Instructions (Signed)
  Start flonase nasal spray 2 sprays into each nostril daily. Do this for 2 weeks.  You can continue the Azelastine spray.  Drink fluids to stay hydrated.  Use the simple saline to irrigate the sinuses.  You can also use a saline spray to keep the sinuses moist.  If your symptoms worsen - you develop a fever, drainage becomes green color please call the office to let us know.

## 2013-10-03 NOTE — Progress Notes (Signed)
Pre visit review using our clinic review tool, if applicable. No additional management support is needed unless otherwise documented below in the visit note. 

## 2013-10-06 ENCOUNTER — Telehealth: Payer: Self-pay | Admitting: Adult Health

## 2013-10-06 ENCOUNTER — Other Ambulatory Visit: Payer: Self-pay | Admitting: Adult Health

## 2013-10-06 MED ORDER — AMOXICILLIN-POT CLAVULANATE 875-125 MG PO TABS: 1.0000 | ORAL_TABLET | Freq: Two times a day (BID) | ORAL | Status: DC

## 2013-10-06 NOTE — Telephone Encounter (Signed)
The patient was seen by Raquel on 1.6.15 for cough.She stated that her mucus has turned green and she is wanting an antibiotic called in to the pharmacy.

## 2013-10-06 NOTE — Telephone Encounter (Signed)
Pt calling to check status of previous phone note.

## 2013-10-06 NOTE — Telephone Encounter (Signed)
Pt notified Rx sent to pharmacy and to call back with worsening or persistence of symptoms

## 2013-10-12 ENCOUNTER — Telehealth: Payer: Self-pay | Admitting: Emergency Medicine

## 2013-10-12 NOTE — Telephone Encounter (Signed)
Referral for [facility] underway for [facility]

## 2013-10-25 NOTE — Telephone Encounter (Signed)
[facility] approved pt to see [place] with 4 visits, exp 04/11/14. Auth # [id number]

## 2013-12-15 ENCOUNTER — Telehealth: Payer: Self-pay | Admitting: Internal Medicine

## 2013-12-15 NOTE — Telephone Encounter (Signed)
Patient Information:  Caller Name: Abbie  Phone: [phone]  Patient: Jacqueline Mata, Jacqueline Mata  Gender: Female  DOB: [birth year]  Age: 73 Years  PCP: Deborra Medina (Adults only)  Office Follow Up:  Does the office need to follow up with this patient?: No  Instructions For The Office: N/A  RN Note:  Afebrile. Onset 12/15/2013 of right calf has red, raised 1/2 inch square mark that is painful to touch, not draining and has some type of scab.  She thinks she may have injured/bumped the leg but cannot remember on what and thinks the wound looks like it is infected: puffy and tender. RN/CAN advised marking the border of the wound and calling back if it increases in size, pain, fever or drainage. Disposition is to be seen within 24 hours, she has never gone to the Urgent Care before and office closed this weekend. She was told to call back or be seen if worse, before Monday 12/18/2013 when the office opens.  Symptoms  Reason For Call & Symptoms: right shin- mid way knee to ankle, raised, red and puffy.  Reviewed Health History In EMR: Yes  Reviewed Medications In EMR: Yes  Reviewed Allergies In EMR: Yes  Reviewed Surgeries / Procedures: Yes  Date of Onset of Symptoms: 12/15/2013  Guideline(s) Used:  Skin Lesion - Moles or Growths  Disposition Per Guideline:   See Today or Tomorrow in Office  Reason For Disposition Reached:   Caller cannot describe it clearly  Advice Given:  Call Back If:  Fever or pain occurs  Any change in the mole or growth  You become worse.  Patient Will Follow Care Advice:  YES

## 2013-12-18 NOTE — Telephone Encounter (Signed)
Patient has an appointment 12/19/13

## 2013-12-18 NOTE — Telephone Encounter (Signed)
This was sent Friday at 6.09 PM

## 2013-12-19 ENCOUNTER — Ambulatory Visit (INDEPENDENT_AMBULATORY_CARE_PROVIDER_SITE_OTHER): Payer: Commercial Managed Care - HMO | Admitting: Internal Medicine

## 2013-12-19 ENCOUNTER — Encounter: Payer: Self-pay | Admitting: Internal Medicine

## 2013-12-19 VITALS — BP 122/74 | HR 87 | Temp 97.7°F | Resp 16 | Wt 180.8 lb

## 2013-12-19 DIAGNOSIS — R238 Other skin changes: Secondary | ICD-10-CM

## 2013-12-19 DIAGNOSIS — D0470 Carcinoma in situ of skin of unspecified lower limb, including hip: Secondary | ICD-10-CM

## 2013-12-19 DIAGNOSIS — Z853 Personal history of malignant neoplasm of breast: Secondary | ICD-10-CM

## 2013-12-19 DIAGNOSIS — L988 Other specified disorders of the skin and subcutaneous tissue: Secondary | ICD-10-CM

## 2013-12-19 MED ORDER — SULFAMETHOXAZOLE-TRIMETHOPRIM 800-160 MG PO TABS: 1.0000 | ORAL_TABLET | Freq: Two times a day (BID) | ORAL | Status: DC

## 2013-12-19 NOTE — Progress Notes (Signed)
Patient ID: Jacqueline Mata, female   DOB: 17-Sep-1941, 73 y.o.   MRN: [id number]  Patient Active Problem List   Diagnosis Date Noted  . Squamous cell carcinoma in situ of skin of lower leg 12/20/2013  . S/P bilateral cataract extraction 08/08/2013  . Overweight (BMI 25.0-29.9) 08/08/2013  . Statin intolerance 08/07/2013  . Poison ivy dermatitis 03/10/2013  . Sinusitis, acute 01/01/2013  . Balance problem due to vestibular dysfunction 05/01/2012  . Status post total knee replacement 05/01/2012  . Breast cancer in situ 11/09/2011  . Hypothyroidism 07/26/2011  . Hyperlipidemia   . Diabetes mellitus type 2, diet-controlled 07/23/2011    Subjective:  CC:   Chief Complaint  Patient presents with  . Acute Visit    wound on right leg frontal area. X 5 days    HPI:   Jacqueline Mata is a 73 y.o. female who presents for  Raised tender red papule on right shin.   first noticed last Friday, may have occurred after bumping shin on something at home.  Did not bleed, had not drained  Has not become larger or spread.  Has been applying bacitracin antibacterial  ointment  On it  With no significant change except that the Center  Has become crusted and and slightly umbilicated. Possible exposures discussed:  Has not been gardening or exercising at a gym.  Has been inside a hospital recently to visit friend but was wearing long  at the time but was wearing long pants   Past Medical History  Diagnosis Date  . Diabetes mellitus   . Hyperlipidemia   . hypothyroidism   . breast cancer   . Cataract 01/04/13 and 03/01/13    Past Surgical History  Procedure Laterality Date  . Joint replacement  2013    by Dr. Marry Guan       The following portions of the patient's history were reviewed and updated as appropriate: Allergies, current medications, and problem list.    Review of Systems:   Patient denies headache, fevers, malaise, unintentional weight loss, skin rash, eye pain, sinus congestion and  sinus pain, sore throat, dysphagia,  hemoptysis , cough, dyspnea, wheezing, chest pain, palpitations, orthopnea, edema, abdominal pain, nausea, melena, diarrhea, constipation, flank pain, dysuria, hematuria, urinary  Frequency, nocturia, numbness, tingling, seizures,  Focal weakness, Loss of consciousness,  Tremor, insomnia, depression, anxiety, and suicidal ideation.     History   Social History  . Marital Status: Single    Spouse Name: N/A    Number of Children: N/A  . Years of Education: N/A   Occupational History  . Not on file.   Social History Main Topics  . Smoking status: Former Smoker    Quit date: 07/22/1978  . Smokeless tobacco: Never Used  . Alcohol Use: Yes     Comment: rare  . Drug Use: No  . Sexual Activity: Not on file   Other Topics Concern  . Not on file   Social History Narrative  . No narrative on file    Objective:  Filed Vitals:   12/19/13 1303  BP: 122/74  Pulse: 87  Temp: 97.7 F (36.5 C)  Resp: 16     General appearance: alert, cooperative and appears stated age Ears: normal TM's and external ear canals both ears Throat: lips, mucosa, and tongue normal; teeth and gums normal Neck: no adenopathy, no carotid bruit, supple, symmetrical, trachea midline and thyroid not enlarged, symmetric, no tenderness/mass/nodules Back: symmetric, no curvature. ROM normal. No CVA tenderness. Lungs: clear  to auscultation bilaterally Heart: regular rate and rhythm, S1, S2 normal, no murmur, click, rub or gallop Abdomen: soft, non-tender; bowel sounds normal; no masses,  no organomegaly Pulses: 2+ and symmetric Skin: pink raised crusted papule on shin, Skin color, texture, turgor normal. No rashes or lesions Lymph nodes: Cervical, supraclavicular, and axillary nodes normal.  Assessment and Plan:  Squamous cell carcinoma in situ of skin of lower leg Suspected by appearance.  Given its acute appearance, however,  Will treat empirically for MRSA boild with  Septrra, and follow up with Dr Kellie Moor if it does not resolve.    Updated Medication List Outpatient Encounter Prescriptions as of 12/19/2013  Medication Sig  . acetaminophen (TYLENOL) 500 MG tablet Take 500 mg by mouth as needed.   Marland Kitchen aspirin 81 MG tablet Take 81 mg by mouth daily.    . Azelaic Acid (FINACEA) 15 % cream Apply topically 2 (two) times daily. After skin is thoroughly washed and patted dry, gently but thoroughly massage a thin film of azelaic acid cream into the affected area twice daily, in the morning and evening.  Marland Kitchen azelastine (ASTELIN) 137 MCG/SPRAY nasal spray Place 1 spray into the nose as needed.   . Blood Glucose Monitoring Suppl (ONE TOUCH ULTRA SYSTEM KIT) W/DEVICE KIT 1 kit by Does not apply route once.  . cholecalciferol (VITAMIN D) 1000 UNITS tablet Take 1,000 Units by mouth daily.    . fluocinonide cream (LIDEX) 0.05 % Apply topically as needed.  . folic acid (FOLVITE) 1 MG tablet Take 1 tablet (1 mg total) by mouth daily.  Marland Kitchen levothyroxine (SYNTHROID, LEVOTHROID) 100 MCG tablet Take 100 mcg by mouth daily before breakfast. 100 MCG ALT WITH 88 MCG THE NEXT DAY AND REPEAT.  Marland Kitchen amoxicillin-clavulanate (AUGMENTIN) 875-125 MG per tablet Take 1 tablet by mouth 2 (two) times daily.  . fluticasone (FLONASE) 50 MCG/ACT nasal spray Place 2 sprays into both nostrils daily.  Marland Kitchen sulfamethoxazole-trimethoprim (SEPTRA DS) 800-160 MG per tablet Take 1 tablet by mouth 2 (two) times daily.     Orders Placed This Encounter  Procedures  . Ambulatory referral to Dermatology  . Ambulatory referral to Oncology    No Follow-up on file.

## 2013-12-19 NOTE — Progress Notes (Signed)
Pre-visit discussion using our clinic review tool. No additional management support is needed unless otherwise documented below in the visit note.  

## 2013-12-20 ENCOUNTER — Encounter: Payer: Self-pay | Admitting: Internal Medicine

## 2013-12-20 DIAGNOSIS — C44711 Basal cell carcinoma of skin of unspecified lower limb, including hip: Secondary | ICD-10-CM | POA: Insufficient documentation

## 2013-12-20 NOTE — Assessment & Plan Note (Signed)
Suspected by appearance.  Given its acute appearance, however,  Will treat empirically for MRSA boild with Septrra, and follow up with Dr Kellie Moor if it does not resolve.

## 2013-12-22 ENCOUNTER — Telehealth: Payer: Self-pay | Admitting: Emergency Medicine

## 2013-12-22 NOTE — Telephone Encounter (Signed)
Pt has been approved to see Dr. Evorn Gong with 4 visits exp 05/05/14. Auth # [id number]

## 2014-02-07 ENCOUNTER — Telehealth: Payer: Self-pay | Admitting: Internal Medicine

## 2014-02-07 ENCOUNTER — Encounter: Payer: Self-pay | Admitting: Internal Medicine

## 2014-02-07 ENCOUNTER — Ambulatory Visit (INDEPENDENT_AMBULATORY_CARE_PROVIDER_SITE_OTHER): Payer: Commercial Managed Care - HMO | Admitting: Internal Medicine

## 2014-02-07 VITALS — BP 120/78 | HR 87 | Temp 98.5°F | Resp 16 | Wt 184.5 lb

## 2014-02-07 DIAGNOSIS — E039 Hypothyroidism, unspecified: Secondary | ICD-10-CM

## 2014-02-07 DIAGNOSIS — E119 Type 2 diabetes mellitus without complications: Secondary | ICD-10-CM

## 2014-02-07 DIAGNOSIS — E785 Hyperlipidemia, unspecified: Secondary | ICD-10-CM

## 2014-02-07 DIAGNOSIS — Z888 Allergy status to other drugs, medicaments and biological substances status: Secondary | ICD-10-CM

## 2014-02-07 DIAGNOSIS — Z789 Other specified health status: Secondary | ICD-10-CM

## 2014-02-07 DIAGNOSIS — M25519 Pain in unspecified shoulder: Secondary | ICD-10-CM

## 2014-02-07 DIAGNOSIS — Z96659 Presence of unspecified artificial knee joint: Secondary | ICD-10-CM

## 2014-02-07 DIAGNOSIS — E663 Overweight: Secondary | ICD-10-CM

## 2014-02-07 DIAGNOSIS — D0470 Carcinoma in situ of skin of unspecified lower limb, including hip: Secondary | ICD-10-CM

## 2014-02-07 DIAGNOSIS — Z853 Personal history of malignant neoplasm of breast: Secondary | ICD-10-CM

## 2014-02-07 LAB — LIPID PANEL
CHOLESTEROL: 229 mg/dL — AB (ref 0–200)
HDL: 59.7 mg/dL (ref >39.00)
LDL CALC: 151 mg/dL — AB (ref 0–99)
Total CHOL/HDL Ratio: 4
Triglycerides: 90 mg/dL (ref 0.0–149.0)
VLDL: 18 mg/dL (ref 0.0–40.0)

## 2014-02-07 LAB — HEMOGLOBIN A1C: Hgb A1c MFr Bld: 6.9 % — ABNORMAL HIGH (ref 4.6–6.5)

## 2014-02-07 NOTE — Telephone Encounter (Signed)
done

## 2014-02-07 NOTE — Patient Instructions (Addendum)
[facility].  This will be less stressful on your joints   I will find out the names of the chiropractors who also perform acupuncture  Remember that high protein snacks in between meals stimulate your metabolism !  Try the prosciutto wrapped mozzarella sticks by [employer] ([employer] and [employer] have them)  Dont' forget you need an annual diabetic eye exam this month

## 2014-02-07 NOTE — Telephone Encounter (Signed)
Please advise,

## 2014-02-07 NOTE — Telephone Encounter (Signed)
Patient does need a referral for a dm eye exam she goes to [facility] and sees Dr. Dawna Part.  Patient already has an appointment for May 27,2015 at 2:00.

## 2014-02-07 NOTE — Progress Notes (Signed)
Pre-visit discussion using our clinic review tool. No additional management support is needed unless otherwise documented below in the visit note.  

## 2014-02-07 NOTE — Assessment & Plan Note (Signed)
Still had myalgias and joint pain with repeat  trial fo lipitor and co Q 10

## 2014-02-07 NOTE — Progress Notes (Signed)
Patient ID: Jacqueline Mata, female   DOB: 1941-02-28, 73 y.o.   MRN: [id number]   Patient Active Problem List   Diagnosis Date Noted  . Basal cell carcinoma of leg 12/20/2013  . S/P bilateral cataract extraction 08/08/2013  . Overweight (BMI 25.0-29.9) 08/08/2013  . Statin intolerance 08/07/2013  . Balance problem due to vestibular dysfunction 05/01/2012  . Status post total knee replacement 05/01/2012  . Breast cancer in situ 11/09/2011  . Hypothyroidism 07/26/2011  . Hyperlipidemia   . Diabetes mellitus type 2, diet-controlled 07/23/2011    Subjective:  CC:   Chief Complaint  Patient presents with  . Follow-up  . Diabetes    HPI:   Jacqueline Mata is a 73 y.o. female who presents for Follow up on diet controlled DM, hyperlipidemia with statin intolerance, and hypothyroidism.  She was referred to Dr Kellie Moor for biopsy of a skin lesion on her right lower leg which was resected last month.  The path report was a basal cell CA .      DM: she has been modifying her diet but having difficulty with the low carb breads,  And is not  exercising regularly because she injured her knee during participation in the the floor exercises hosted by [employer] at [employer] for senior citizens .  She has been taking Aleve  For concurrent pain in her shoulder which she reports as severe at times.  Also having  left hip ,  Left knee and left ankle pain. She has gained 6 lbs since November.   Checking blood sugars infrequently.  Has had fastings in the 110 to 122 fasting range.       Past Medical History  Diagnosis Date  . Diabetes mellitus   . Hyperlipidemia   . hypothyroidism   . breast cancer   . Cataract 01/04/13 and 03/01/13    Past Surgical History  Procedure Laterality Date  . Joint replacement  2013    by Dr. Marry Guan       The following portions of the patient's history were reviewed and updated as appropriate: Allergies, current medications, and problem  list.    Review of Systems:   Patient denies headache, fevers, malaise, unintentional weight loss, skin rash, eye pain, sinus congestion and sinus pain, sore throat, dysphagia,  hemoptysis , cough, dyspnea, wheezing, chest pain, palpitations, orthopnea, edema, abdominal pain, nausea, melena, diarrhea, constipation, flank pain, dysuria, hematuria, urinary  Frequency, nocturia, numbness, tingling, seizures,  Focal weakness, Loss of consciousness,  Tremor, insomnia, depression, anxiety, and suicidal ideation.     History   Social History  . Marital Status: Single    Spouse Name: N/A    Number of Children: N/A  . Years of Education: N/A   Occupational History  . Not on file.   Social History Main Topics  . Smoking status: Former Smoker    Quit date: 07/22/1978  . Smokeless tobacco: Never Used  . Alcohol Use: Yes     Comment: rare  . Drug Use: No  . Sexual Activity: Not on file   Other Topics Concern  . Not on file   Social History Narrative  . No narrative on file    Objective:  Filed Vitals:   02/07/14 0959  BP: 120/78  Pulse: 87  Temp: 98.5 F (36.9 C)  Resp: 16     General appearance: alert, cooperative and appears stated age Ears: normal TM's and external ear canals both ears Throat: lips, mucosa, and tongue normal; teeth and  gums normal Neck: no adenopathy, no carotid bruit, supple, symmetrical, trachea midline and thyroid not enlarged, symmetric, no tenderness/mass/nodules Back: symmetric, no curvature. ROM normal. No CVA tenderness. Lungs: clear to auscultation bilaterally Heart: regular rate and rhythm, S1, S2 normal, no murmur, click, rub or gallop Abdomen: soft, non-tender; bowel sounds normal; no masses,  no organomegaly Pulses: 2+ and symmetric Skin: Skin color, texture, turgor normal. No rashes or lesions Lymph nodes: Cervical, supraclavicular, and axillary nodes normal.  Assessment and Plan:  Statin intolerance Still had myalgias and joint pain  with repeat  trial fo lipitor and co Q 10  Basal cell carcinoma of leg Per patient, resected on April 10th,  By Dr. Kellie Moor    Hyperlipidemia She is intolerant of statins by prior trials. .  Lab Results  Component Value Date   CHOL 229* 02/07/2014   HDL 59.70 02/07/2014   LDLCALC 151* 02/07/2014   LDLDIRECT 168.8 08/16/2013   TRIG 90.0 02/07/2014   CHOLHDL 4 02/07/2014     Diabetes mellitus type 2, diet-controlled .  Has been historically well-controlled on diet alone .  hemoglobin A1c has been consistently less than 7.0 . Patient is up-to-date on eye exams and foot exam was done today.  There is  no proteinuria on today's micro urinalysis .   Lab Results  Component Value Date   HGBA1C 6.9* 02/07/2014   Lab Results  Component Value Date   MICROALBUR 1.3 02/08/2014     Hypothyroidism Thyroid function is WNL on current dose.  No current changes needed.   Lab Results  Component Value Date   TSH 1.32 08/16/2013     Overweight (BMI 25.0-29.9) I have addressed  BMI and recommended wt loss of 10% of body weigh over the next 6 months using a low glycemic index diet and regular exercise a minimum of 5 days per week. Her efforts have been hindered by multiple joint pains.  Suggested water aerobics as an alternative to floor exercises.    A total of 40 minutes was spent with patient more than half of which was spent in counseling, reviewing records from other prviders and coordination of care.  Updated Medication List Outpatient Encounter Prescriptions as of 02/07/2014  Medication Sig  . acetaminophen (TYLENOL) 500 MG tablet Take 500 mg by mouth as needed.   Marland Kitchen aspirin 81 MG tablet Take 81 mg by mouth daily.    . Azelaic Acid (FINACEA) 15 % cream Apply topically 2 (two) times daily. After skin is thoroughly washed and patted dry, gently but thoroughly massage a thin film of azelaic acid cream into the affected area twice daily, in the morning and evening.  Marland Kitchen azelastine (ASTELIN)  137 MCG/SPRAY nasal spray Place 1 spray into the nose as needed.   . Blood Glucose Monitoring Suppl (ONE TOUCH ULTRA SYSTEM KIT) W/DEVICE KIT 1 kit by Does not apply route once.  . cholecalciferol (VITAMIN D) 1000 UNITS tablet Take 1,000 Units by mouth daily.    . fluocinonide cream (LIDEX) 0.05 % Apply topically as needed.  . folic acid (FOLVITE) 1 MG tablet Take 1 tablet (1 mg total) by mouth daily.  Marland Kitchen levothyroxine (SYNTHROID, LEVOTHROID) 100 MCG tablet Take 100 mcg by mouth daily before breakfast. 100 MCG ALT WITH 88 MCG THE NEXT DAY AND REPEAT.  Marland Kitchen amoxicillin-clavulanate (AUGMENTIN) 875-125 MG per tablet Take 1 tablet by mouth 2 (two) times daily.  . fluticasone (FLONASE) 50 MCG/ACT nasal spray Place 2 sprays into both nostrils daily.  Marland Kitchen sulfamethoxazole-trimethoprim (  SEPTRA DS) 800-160 MG per tablet Take 1 tablet by mouth 2 (two) times daily.     Orders Placed This Encounter  Procedures  . MM Digital Diagnostic Bilat  . Lipid panel  . Hemoglobin A1c  . Urine Microalbumin w/creat. ratio  . Ambulatory referral to Oncology  . Ambulatory referral to Orthopedic Surgery    Return in about 3 months (around 05/10/2014).

## 2014-02-08 ENCOUNTER — Encounter: Payer: Self-pay | Admitting: Internal Medicine

## 2014-02-08 LAB — MICROALBUMIN / CREATININE URINE RATIO
Creatinine,U: 75.3 mg/dL
MICROALB UR: 1.3 mg/dL (ref 0.0–1.9)
Microalb Creat Ratio: 1.7 mg/g (ref 0.0–30.0)

## 2014-02-08 NOTE — Assessment & Plan Note (Signed)
She is intolerant of statins by prior trials. .  Lab Results  Component Value Date   CHOL 229* 02/07/2014   HDL 59.70 02/07/2014   LDLCALC 151* 02/07/2014   LDLDIRECT 168.8 08/16/2013   TRIG 90.0 02/07/2014   CHOLHDL 4 02/07/2014

## 2014-02-08 NOTE — Assessment & Plan Note (Addendum)
Per patient, resected on April 10th,  By Dr. Kellie Moor

## 2014-02-08 NOTE — Assessment & Plan Note (Signed)
Thyroid function is WNL on current dose.  No current changes needed.   Lab Results  Component Value Date   TSH 1.32 08/16/2013

## 2014-02-08 NOTE — Assessment & Plan Note (Signed)
.    Has been historically well-controlled on diet alone .  hemoglobin A1c has been consistently less than 7.0 . Patient is up-to-date on eye exams and foot exam was done today.  There is  no proteinuria on today's micro urinalysis .   Lab Results  Component Value Date   HGBA1C 6.9* 02/07/2014   Lab Results  Component Value Date   MICROALBUR 1.3 02/08/2014

## 2014-02-08 NOTE — Assessment & Plan Note (Signed)
I have addressed  BMI and recommended wt loss of 10% of body weigh over the next 6 months using a low glycemic index diet and regular exercise a minimum of 5 days per week. Her efforts have been hindered by multiple joint pains.  Suggested water aerobics as an alternative to floor exercises.

## 2014-02-09 ENCOUNTER — Encounter: Payer: Self-pay | Admitting: Internal Medicine

## 2014-02-13 NOTE — Telephone Encounter (Signed)
Mailed unread message to pt  

## 2014-02-14 ENCOUNTER — Telehealth: Payer: Self-pay | Admitting: Hematology & Oncology

## 2014-02-14 NOTE — Telephone Encounter (Signed)
Jacqueline Mata Nbr: [id number] Status: Approved Dates:02/16/2014-05/17/2014 NEW PATIENT VISIT  [phone] - Unlisted E&M Service - 6 visits   Copy Scanned

## 2014-02-18 LAB — HM MAMMOGRAPHY: HM MAMMO: NORMAL

## 2014-02-21 ENCOUNTER — Encounter: Payer: Self-pay | Admitting: Hematology & Oncology

## 2014-02-28 ENCOUNTER — Ambulatory Visit (HOSPITAL_BASED_OUTPATIENT_CLINIC_OR_DEPARTMENT_OTHER): Payer: Commercial Managed Care - HMO | Admitting: Hematology & Oncology

## 2014-02-28 ENCOUNTER — Other Ambulatory Visit (HOSPITAL_BASED_OUTPATIENT_CLINIC_OR_DEPARTMENT_OTHER): Payer: Commercial Managed Care - HMO | Admitting: Lab

## 2014-02-28 VITALS — BP 122/76 | HR 98 | Temp 98.0°F | Resp 16 | Wt 180.0 lb

## 2014-02-28 DIAGNOSIS — Z853 Personal history of malignant neoplasm of breast: Secondary | ICD-10-CM

## 2014-02-28 DIAGNOSIS — E559 Vitamin D deficiency, unspecified: Secondary | ICD-10-CM

## 2014-02-28 DIAGNOSIS — D0590 Unspecified type of carcinoma in situ of unspecified breast: Secondary | ICD-10-CM

## 2014-02-28 DIAGNOSIS — C44711 Basal cell carcinoma of skin of unspecified lower limb, including hip: Secondary | ICD-10-CM

## 2014-02-28 LAB — CBC WITH DIFFERENTIAL (CANCER CENTER ONLY)
BASO#: 0.1 10*3/uL (ref 0.0–0.2)
BASO%: 0.7 % (ref 0.0–2.0)
EOS%: 2.8 % (ref 0.0–7.0)
Eosinophils Absolute: 0.2 10*3/uL (ref 0.0–0.5)
HEMATOCRIT: 41.9 % (ref 34.8–46.6)
HGB: 14.7 g/dL (ref 11.6–15.9)
LYMPH#: 2.9 10*3/uL (ref 0.9–3.3)
LYMPH%: 35.1 % (ref 14.0–48.0)
MCH: 32 pg (ref 26.0–34.0)
MCHC: 35.1 g/dL (ref 32.0–36.0)
MCV: 91 fL (ref 81–101)
MONO#: 0.4 10*3/uL (ref 0.1–0.9)
MONO%: 5.2 % (ref 0.0–13.0)
NEUT#: 4.6 10*3/uL (ref 1.5–6.5)
NEUT%: 56.2 % (ref 39.6–80.0)
Platelets: 183 10*3/uL (ref 145–400)
RBC: 4.6 10*6/uL (ref 3.70–5.32)
RDW: 13.1 % (ref 11.1–15.7)
WBC: 8.1 10*3/uL (ref 3.9–10.0)

## 2014-02-28 LAB — COMPREHENSIVE METABOLIC PANEL
ALT: 23 U/L (ref 0–35)
AST: 22 U/L (ref 0–37)
Albumin: 4.5 g/dL (ref 3.5–5.2)
Alkaline Phosphatase: 61 U/L (ref 39–117)
BUN: 16 mg/dL (ref 6–23)
CALCIUM: 9.7 mg/dL (ref 8.4–10.5)
CHLORIDE: 104 meq/L (ref 96–112)
CO2: 25 mEq/L (ref 19–32)
CREATININE: 1.11 mg/dL — AB (ref 0.50–1.10)
Glucose, Bld: 101 mg/dL — ABNORMAL HIGH (ref 70–99)
Potassium: 3.7 mEq/L (ref 3.5–5.3)
Sodium: 137 mEq/L (ref 135–145)
TOTAL PROTEIN: 7 g/dL (ref 6.0–8.3)
Total Bilirubin: 0.7 mg/dL (ref 0.2–1.2)

## 2014-02-28 NOTE — Progress Notes (Signed)
Hematology and Oncology Follow Up Visit  Jacqueline Mata [id number] March 14, 1941 73 y.o. 02/28/2014   Principle Diagnosis:  Synchronous bilateral stage IIA (T2 N0 M0) ductal carcinoma of bilateral breast.  Current Therapy:    observation     Interim History:  Ms.  Mata is comes in for followup. Last saw her back in December. At that point,, she has been doing quite well. She's had a pounds since I last saw her. She had a recent mammogram. This was done in May of 2015. The mammogram did not show any evidence of recurrent or a second breast cancers. She's had no bony issues. She's had no change in bowel or bladder habits. There's been no rashes. She's had no cough. She's had no fever sweats or chills.  Medications: Current outpatient prescriptions:acetaminophen (TYLENOL) 500 MG tablet, Take 500 mg by mouth every 6 (six) hours as needed., Disp: , Rfl: ;  aspirin 81 MG tablet, Take 81 mg by mouth daily., Disp: , Rfl:  Azelaic Acid (FINACEA) 15 % cream, Apply topically 2 (two) times daily. After skin is thoroughly washed and patted dry, gently but thoroughly massage a thin film of azelaic acid cream into the affected area twice daily, in the morning and evening., Disp: , Rfl: ;  azelastine (ASTELIN) 0.1 % nasal spray, Place into both nostrils 2 (two) times daily as needed for rhinitis. Use in each nostril as directed, Disp: , Rfl:  Blood Glucose Monitoring Suppl KIT, by Does not apply route., Disp: , Rfl: ;  Cholecalciferol 10000 UNITS CAPS, Take by mouth., Disp: , Rfl: ;  fluocinonide cream (LIDEX) 7.06 %, Apply 1 application topically daily as needed., Disp: , Rfl: ;  fluticasone (FLONASE) 50 MCG/ACT nasal spray, Place into both nostrils daily. Place 2 spriay into both nostrils daily, Disp: , Rfl: ;  folic acid (FOLVITE) 1 MG tablet, Take 1 mg by mouth daily., Disp: , Rfl:  levothyroxine (SYNTHROID, LEVOTHROID) 100 MCG tablet, Take 100 mcg by mouth daily before breakfast. 100 mcg by mouth daily before  breakfast.  100 mcg alt with 88 mcg the next day and repeat, Disp: , Rfl:   Allergies:  Allergies  Allergen Reactions  . Ilevro [Nepafenac]   . Adhesive [Tape] Dermatitis  . Codeine   . Latex   . Mold Extract [Trichophyton Mentagrophyte]   . Neomycin   . Nucynta [Tapentadol] Nausea And Vomiting  . Tramadol     dizziness    Past Medical History, Surgical history, Social history, and Family History were reviewed and updated.  Review of Systems: As above  Physical Exam:  weight is 180 lb (81.647 kg). Her oral temperature is 98 F (36.7 C). Her blood pressure is 122/76 and her pulse is 98. Her respiration is 16.   Well-developed and well-nourished white female. Head and exam has no ocular or oral lesions. She has no palpable cervical or supraclavicular lymph nodes. Lungs are clear bilateral. Cardiac exam regular in rhythm with no murmurs rubs or bruits. Abdomen is soft. Has good bowel sounds. There is no fluid wave. Is a palpable liver or spleen tip. Breast exam shows left breast with well-healed lumpectomy at the 9:00 position. No masses noted in the left breast. There is no left axillary adenopathy. Right breast is somewhat contracted from radiation and surgery. She has a well-healed lumpectomy at the 4:00 position. There is no mass in the right breast. There is no right axillary adenopathy. Extremities shows no clubbing cyanosis or edema. Neurological exam shows no  focal neurological deficits. Skin exam no rashes ecchymosis or petechia. Lab Results  Component Value Date   WBC 8.1 02/28/2014   HGB 14.7 02/28/2014   HCT 41.9 02/28/2014   MCV 91 02/28/2014   PLT 183 02/28/2014     Chemistry      Component Value Date/Time   NA 137 08/30/2013 1341   K 3.9 08/30/2013 1341   CL 106 08/30/2013 1341   CO2 21 08/30/2013 1341   BUN 14 08/30/2013 1341   CREATININE 0.89 08/30/2013 1341   CREATININE 0.85 12/30/2012 1703      Component Value Date/Time   CALCIUM 9.3 08/30/2013 1341   ALKPHOS 56 08/30/2013  1341   AST 24 08/30/2013 1341   ALT 25 08/30/2013 1341   BILITOT 0.8 08/30/2013 1341         Impression and Plan: Jacqueline Mata is 73 year old white female with history of synchronous bilateral breast cancer. Family, both breast cancers were noted negative.  For now, we will plan to get her back in another 6 months.  Has been 10 years since she had treatment. Again she's done incredibly well.   Volanda Napoleon, MD 6/3/20154:29 PM

## 2014-03-02 ENCOUNTER — Encounter: Payer: Self-pay | Admitting: *Deleted

## 2014-05-21 ENCOUNTER — Encounter: Payer: Self-pay | Admitting: Internal Medicine

## 2014-05-21 ENCOUNTER — Ambulatory Visit (INDEPENDENT_AMBULATORY_CARE_PROVIDER_SITE_OTHER): Payer: Commercial Managed Care - HMO | Admitting: Internal Medicine

## 2014-05-21 VITALS — BP 118/78 | HR 86 | Temp 98.6°F | Resp 16 | Ht 65.5 in | Wt 181.5 lb

## 2014-05-21 DIAGNOSIS — Z23 Encounter for immunization: Secondary | ICD-10-CM

## 2014-05-21 DIAGNOSIS — Z789 Other specified health status: Secondary | ICD-10-CM

## 2014-05-21 DIAGNOSIS — E038 Other specified hypothyroidism: Secondary | ICD-10-CM

## 2014-05-21 DIAGNOSIS — E663 Overweight: Secondary | ICD-10-CM

## 2014-05-21 DIAGNOSIS — C44711 Basal cell carcinoma of skin of unspecified lower limb, including hip: Secondary | ICD-10-CM

## 2014-05-21 DIAGNOSIS — E119 Type 2 diabetes mellitus without complications: Secondary | ICD-10-CM

## 2014-05-21 DIAGNOSIS — E559 Vitamin D deficiency, unspecified: Secondary | ICD-10-CM

## 2014-05-21 DIAGNOSIS — Z0000 Encounter for general adult medical examination without abnormal findings: Secondary | ICD-10-CM

## 2014-05-21 DIAGNOSIS — D0590 Unspecified type of carcinoma in situ of unspecified breast: Secondary | ICD-10-CM

## 2014-05-21 DIAGNOSIS — E0789 Other specified disorders of thyroid: Secondary | ICD-10-CM

## 2014-05-21 DIAGNOSIS — E034 Atrophy of thyroid (acquired): Secondary | ICD-10-CM

## 2014-05-21 LAB — COMPREHENSIVE METABOLIC PANEL
ALT: 21 U/L (ref 0–35)
AST: 23 U/L (ref 0–37)
Albumin: 4.1 g/dL (ref 3.5–5.2)
Alkaline Phosphatase: 59 U/L (ref 39–117)
BILIRUBIN TOTAL: 0.9 mg/dL (ref 0.2–1.2)
BUN: 14 mg/dL (ref 6–23)
CALCIUM: 9.6 mg/dL (ref 8.4–10.5)
CHLORIDE: 105 meq/L (ref 96–112)
CO2: 24 mEq/L (ref 19–32)
Creatinine, Ser: 0.8 mg/dL (ref 0.4–1.2)
GFR: 72.76 mL/min (ref >60.00)
GLUCOSE: 114 mg/dL — AB (ref 70–99)
Potassium: 4 mEq/L (ref 3.5–5.1)
Sodium: 138 mEq/L (ref 135–145)
TOTAL PROTEIN: 6.9 g/dL (ref 6.0–8.3)

## 2014-05-21 LAB — CBC WITH DIFFERENTIAL/PLATELET
BASOS PCT: 0.8 % (ref 0.0–3.0)
Basophils Absolute: 0.1 10*3/uL (ref 0.0–0.1)
EOS ABS: 0.2 10*3/uL (ref 0.0–0.7)
Eosinophils Relative: 2.9 % (ref 0.0–5.0)
HCT: 42.1 % (ref 36.0–46.0)
Hemoglobin: 14.2 g/dL (ref 12.0–15.0)
LYMPHS ABS: 2.1 10*3/uL (ref 0.7–4.0)
Lymphocytes Relative: 31 % (ref 12.0–46.0)
MCHC: 33.8 g/dL (ref 30.0–36.0)
MCV: 93.4 fl (ref 78.0–100.0)
Monocytes Absolute: 0.4 10*3/uL (ref 0.1–1.0)
Monocytes Relative: 5.9 % (ref 3.0–12.0)
NEUTROS ABS: 4 10*3/uL (ref 1.4–7.7)
NEUTROS PCT: 59.4 % (ref 43.0–77.0)
PLATELETS: 183 10*3/uL (ref 150.0–400.0)
RBC: 4.51 Mil/uL (ref 3.87–5.11)
RDW: 13.8 % (ref 11.5–15.5)
WBC: 6.7 10*3/uL (ref 4.0–10.5)

## 2014-05-21 LAB — VITAMIN D 25 HYDROXY (VIT D DEFICIENCY, FRACTURES): VITD: 34.85 ng/mL (ref 30.00–100.00)

## 2014-05-21 LAB — HM DIABETES FOOT EXAM: HM DIABETIC FOOT EXAM: NORMAL

## 2014-05-21 LAB — HEMOGLOBIN A1C: Hgb A1c MFr Bld: 6.5 % (ref 4.6–6.5)

## 2014-05-21 NOTE — Progress Notes (Signed)
Pre-visit discussion using our clinic review tool. No additional management support is needed unless otherwise documented below in the visit note.  

## 2014-05-21 NOTE — Progress Notes (Signed)
Patient ID: Jacqueline Mata, female   DOB: December 03, 1940, 73 y.o.   MRN: [id number]  The patient is here for annual Medicare wellness examination and management of other chronic and acute problems.    1) diet controlled DM.  She was last seen in may  At which time her a1c was 6.9.  She has lost 6 lbs by home scales since June. She has modifeid her diet and elminated all white things   The risk factors are reflected in the social history.  The roster of all physicians providing medical care to patient - is listed in the Snapshot section of the chart.  Activities of daily living:  The patient is 100% independent in all ADLs: dressing, toileting, feeding as well as independent mobility  Home safety : The patient has smoke detectors in the home. They wear seatbelts.  There are no firearms at home. There is no violence in the home.   There is no risks for hepatitis, STDs or HIV. There is no   history of blood transfusion. They have no travel history to infectious disease endemic areas of the world.  The patient has seen their dentist in the last six month. They have seen their eye doctor in the last year. They admit to slight hearing difficulty with regard to whispered voices and some television programs.  They have deferred audiologic testing in the last year.  They do not  have excessive sun exposure. Discussed the need for sun protection: hats, long sleeves and use of sunscreen if there is significant sun exposure.   Diet: the importance of a healthy diet is discussed. They do have a healthy diet.  The benefits of regular aerobic exercise were discussed. She walks 4 times per week ,  20 minutes.   Depression screen: there are no signs or vegative symptoms of depression- irritability, change in appetite, anhedonia, sadness/tearfullness.  Cognitive assessment: the patient manages all their financial and personal affairs and is actively engaged. They could relate day,date,year and events; recalled  2/3 objects at 3 minutes; performed clock-face test normally.  The following portions of the patient's history were reviewed and updated as appropriate: allergies, current medications, past family history, past medical history,  past surgical history, past social history  and problem list.  Visual acuity was not assessed per patient preference since she has regular follow up with her ophthalmologist. Hearing and body mass index were assessed and reviewed.   During the course of the visit the patient was educated and counseled about appropriate screening and preventive services including : fall prevention , diabetes screening, nutrition counseling, colorectal cancer screening, and recommended immunizations.    Objective:  BP 118/78  Pulse 86  Temp(Src) 98.6 F (37 C) (Oral)  Resp 16  Ht 5' 5.5" (1.664 m)  Wt 181 lb 8 oz (82.328 kg)  BMI 29.73 kg/m2  SpO2 97% General appearance: alert, cooperative and appears stated age Head: Normocephalic, without obvious abnormality, atraumatic Eyes: conjunctivae/corneas clear. PERRL, EOM's intact. Fundi benign. Ears: normal TM's and external ear canals both ears Nose: Nares normal. Septum midline. Mucosa normal. No drainage or sinus tenderness. Throat: lips, mucosa, and tongue normal; teeth and gums normal Neck: no adenopathy, no carotid bruit, no JVD, supple, symmetrical, trachea midline and thyroid not enlarged, symmetric, no tenderness/mass/nodules Lungs: clear to auscultation bilaterally Breasts: right lumpectomy scar, ink marker noted otherwise normal appearance, no masses or tenderness Heart: regular rate and rhythm, S1, S2 normal, no murmur, click, rub or gallop Abdomen: soft,  non-tender; bowel sounds normal; no masses,  no organomegaly Extremities: extremities normal, atraumatic, no cyanosis or edema Pulses: 2+ and symmetric Skin: Skin color, texture, turgor normal. No rashes or lesions Neurologic: Alert and oriented X 3, normal strength and  tone. Normal symmetric reflexes. Normal coordination and gait.   Assessment and Plan:  Diabetes mellitus type 2, diet-controlled  well-controlled on diet alone .  hemoglobin A1c has been consistently less than 7.0 . Patient is up-to-date on eye exams and foot exam was normal today.  There is  no proteinuria on prior micro urinalysis .   Lab Results  Component Value Date   HGBA1C 6.5 05/21/2014   Lab Results  Component Value Date   MICROALBUR 1.3 02/08/2014       Hypothyroidism Thyroid function is pending on current dose.  No current changes needed.   Lab Results  Component Value Date   TSH 1.32 08/16/2013       Basal cell carcinoma of leg She has had a small skin teat over the healing surgical incision , which was evaluated today .  Has been present for about 2 weeks.  Advised to keep wound covered and if still not resolved in 2 weeks return to dermatology  Statin intolerance Her  LDL is elevated but she has a history of development of significant muscle pain with prior trials of statins   Lab Results  Component Value Date   CHOL 229* 02/07/2014   HDL 59.70 02/07/2014   LDLCALC 151* 02/07/2014   LDLDIRECT 168.8 08/16/2013   TRIG 90.0 02/07/2014   CHOLHDL 4 02/07/2014     Overweight (BMI 25.0-29.9) I have congratulated her in reduction of   BMI and encouraged  Continued weight loss with goal of 10% of body weigh over the next 6 months using a low glycemic index diet and regular exercise a minimum of 5 days per week.    Breast cancer in situ Surveillance is ongoing managed by Dr . Orlie Dakin,  Last mammogram may 2015.  Breast exam done today notes scar tissue right breast and ink markers      Encounter for Medicare annual wellness exam Annual Medicare wellness  exam was done as well as a comprehensive physical exam and management of acute and chronic conditions .  Health maintenance screenings have ben addressed and hand out given .    Updated Medication  List Outpatient Encounter Prescriptions as of 05/21/2014  Medication Sig  . acetaminophen (TYLENOL) 500 MG tablet Take 500 mg by mouth every 6 (six) hours as needed.  Marland Kitchen aspirin 81 MG tablet Take 81 mg by mouth daily.  Marland Kitchen azelastine (ASTELIN) 0.1 % nasal spray Place into both nostrils 2 (two) times daily as needed for rhinitis. Use in each nostril as directed  . Blood Glucose Monitoring Suppl KIT by Does not apply route.  . Cholecalciferol (VITAMIN D3) 1000 UNITS CAPS Take 1 capsule by mouth daily.  . fluocinonide cream (LIDEX) 0.05 % Apply 1 application topically daily as needed.  . folic acid (FOLVITE) 1 MG tablet Take 1 mg by mouth daily.  Marland Kitchen levothyroxine (SYNTHROID, LEVOTHROID) 100 MCG tablet Take 100 mcg by mouth daily before breakfast. 100 mcg by mouth daily before breakfast.  100 mcg alt with 88 mcg the next day and repeat  . Azelaic Acid (FINACEA) 15 % cream Apply topically 2 (two) times daily. After skin is thoroughly washed and patted dry, gently but thoroughly massage a thin film of azelaic acid cream into the affected area  twice daily, in the morning and evening.  . fluticasone (FLONASE) 50 MCG/ACT nasal spray Place into both nostrils daily. Place 2 spriay into both nostrils daily  . [DISCONTINUED] Cholecalciferol 10000 UNITS CAPS Take by mouth.

## 2014-05-21 NOTE — Patient Instructions (Signed)
You are doing well!  Your memory appears to be very intact.  We will check it again in 6 months   Health Maintenance Adopting a healthy lifestyle and getting preventive care can go a long way to promote health and wellness. Talk with your health care provider about what schedule of regular examinations is right for you. This is a good chance for you to check in with your provider about disease prevention and staying healthy. In between checkups, there are plenty of things you can do on your own. Experts have done a lot of research about which lifestyle changes and preventive measures are most likely to keep you healthy. Ask your health care provider for more information. WEIGHT AND DIET  Eat a healthy diet  Be sure to include plenty of vegetables, fruits, low-fat dairy products, and lean protein.  Do not eat a lot of foods high in solid fats, added sugars, or salt.  Get regular exercise. This is one of the most important things you can do for your health.  Most adults should exercise for at least 150 minutes each week. The exercise should increase your heart rate and make you sweat (moderate-intensity exercise).  Most adults should also do strengthening exercises at least twice a week. This is in addition to the moderate-intensity exercise.  Maintain a healthy weight  Body mass index (BMI) is a measurement that can be used to identify possible weight problems. It estimates body fat based on height and weight. Your health care provider can help determine your BMI and help you achieve or maintain a healthy weight.  For females 51 years of age and older:   A BMI below 18.5 is considered underweight.  A BMI of 18.5 to 24.9 is normal.  A BMI of 25 to 29.9 is considered overweight.  A BMI of 30 and above is considered obese.  Watch levels of cholesterol and blood lipids  You should start having your blood tested for lipids and cholesterol at 72 years of age, then have this test every 5  years.  You may need to have your cholesterol levels checked more often if:  Your lipid or cholesterol levels are high.  You are older than 72 years of age.  You are at high risk for heart disease.  CANCER SCREENING   Lung Cancer  Lung cancer screening is recommended for adults 89-48 years old who are at high risk for lung cancer because of a history of smoking.  A yearly low-dose CT scan of the lungs is recommended for people who:  Currently smoke.  Have quit within the past 15 years.  Have at least a 30-pack-year history of smoking. A pack year is smoking an average of one pack of cigarettes a day for 1 year.  Yearly screening should continue until it has been 15 years since you quit.  Yearly screening should stop if you develop a health problem that would prevent you from having lung cancer treatment.  Breast Cancer  Practice breast self-awareness. This means understanding how your breasts normally appear and feel.  It also means doing regular breast self-exams. Let your health care provider know about any changes, no matter how small.  If you are in your 20s or 30s, you should have a clinical breast exam (CBE) by a health care provider every 1-3 years as part of a regular health exam.  If you are 45 or older, have a CBE every year. Also consider having a breast X-ray (mammogram) every year.  If you have a family history of breast cancer, talk to your health care provider about genetic screening.  If you are at high risk for breast cancer, talk to your health care provider about having an MRI and a mammogram every year.  Breast cancer gene (BRCA) assessment is recommended for women who have family members with BRCA-related cancers. BRCA-related cancers include:  Breast.  Ovarian.  Tubal.  Peritoneal cancers.  Results of the assessment will determine the need for genetic counseling and BRCA1 and BRCA2 testing. Cervical Cancer Routine pelvic examinations to  screen for cervical cancer are no longer recommended for nonpregnant women who are considered low risk for cancer of the pelvic organs (ovaries, uterus, and vagina) and who do not have symptoms. A pelvic examination may be necessary if you have symptoms including those associated with pelvic infections. Ask your health care provider if a screening pelvic exam is right for you.   The Pap test is the screening test for cervical cancer for women who are considered at risk.  If you had a hysterectomy for a problem that was not cancer or a condition that could lead to cancer, then you no longer need Pap tests.  If you are older than 65 years, and you have had normal Pap tests for the past 10 years, you no longer need to have Pap tests.  If you have had past treatment for cervical cancer or a condition that could lead to cancer, you need Pap tests and screening for cancer for at least 20 years after your treatment.  If you no longer get a Pap test, assess your risk factors if they change (such as having a new sexual partner). This can affect whether you should start being screened again.  Some women have medical problems that increase their chance of getting cervical cancer. If this is the case for you, your health care provider may recommend more frequent screening and Pap tests.  The human papillomavirus (HPV) test is another test that may be used for cervical cancer screening. The HPV test looks for the virus that can cause cell changes in the cervix. The cells collected during the Pap test can be tested for HPV.  The HPV test can be used to screen women 93 years of age and older. Getting tested for HPV can extend the interval between normal Pap tests from three to five years.  An HPV test also should be used to screen women of any age who have unclear Pap test results.  After 72 years of age, women should have HPV testing as often as Pap tests.  Colorectal Cancer  This type of cancer can be  detected and often prevented.  Routine colorectal cancer screening usually begins at 72 years of age and continues through 72 years of age.  Your health care provider may recommend screening at an earlier age if you have risk factors for colon cancer.  Your health care provider may also recommend using home test kits to check for hidden blood in the stool.  A small camera at the end of a tube can be used to examine your colon directly (sigmoidoscopy or colonoscopy). This is done to check for the earliest forms of colorectal cancer.  Routine screening usually begins at age 29.  Direct examination of the colon should be repeated every 5-10 years through 72 years of age. However, you may need to be screened more often if early forms of precancerous polyps or small growths are found. Skin Cancer  Check your skin from head to toe regularly.  Tell your health care provider about any new moles or changes in moles, especially if there is a change in a mole's shape or color.  Also tell your health care provider if you have a mole that is larger than the size of a pencil eraser.  Always use sunscreen. Apply sunscreen liberally and repeatedly throughout the day.  Protect yourself by wearing long sleeves, pants, a wide-brimmed hat, and sunglasses whenever you are outside. HEART DISEASE, DIABETES, AND HIGH BLOOD PRESSURE   Have your blood pressure checked at least every 1-2 years. High blood pressure causes heart disease and increases the risk of stroke.  If you are between 51 years and 36 years old, ask your health care provider if you should take aspirin to prevent strokes.  Have regular diabetes screenings. This involves taking a blood sample to check your fasting blood sugar level.  If you are at a normal weight and have a low risk for diabetes, have this test once every three years after 72 years of age.  If you are overweight and have a high risk for diabetes, consider being tested at a  younger age or more often. PREVENTING INFECTION  Hepatitis B  If you have a higher risk for hepatitis B, you should be screened for this virus. You are considered at high risk for hepatitis B if:  You were born in a country where hepatitis B is common. Ask your health care provider which countries are considered high risk.  Your parents were born in a high-risk country, and you have not been immunized against hepatitis B (hepatitis B vaccine).  You have HIV or AIDS.  You use needles to inject street drugs.  You live with someone who has hepatitis B.  You have had sex with someone who has hepatitis B.  You get hemodialysis treatment.  You take certain medicines for conditions, including cancer, organ transplantation, and autoimmune conditions. Hepatitis C  Blood testing is recommended for:  Everyone born from [birth year] through 1964.  Anyone with known risk factors for hepatitis C. Sexually transmitted infections (STIs)  You should be screened for sexually transmitted infections (STIs) including gonorrhea and chlamydia if:  You are sexually active and are younger than 72 years of age.  You are older than 72 years of age and your health care provider tells you that you are at risk for this type of infection.  Your sexual activity has changed since you were last screened and you are at an increased risk for chlamydia or gonorrhea. Ask your health care provider if you are at risk.  If you do not have HIV, but are at risk, it may be recommended that you take a prescription medicine daily to prevent HIV infection. This is called pre-exposure prophylaxis (PrEP). You are considered at risk if:  You are sexually active and do not regularly use condoms or know the HIV status of your partner(s).  You take drugs by injection.  You are sexually active with a partner who has HIV. Talk with your health care provider about whether you are at high risk of being infected with HIV. If you choose  to begin PrEP, you should first be tested for HIV. You should then be tested every 3 months for as long as you are taking PrEP.  PREGNANCY   If you are premenopausal and you may become pregnant, ask your health care provider about preconception counseling.  If you may become pregnant,  take 400 to 800 micrograms (mcg) of folic acid every day.  If you want to prevent pregnancy, talk to your health care provider about birth control (contraception). OSTEOPOROSIS AND MENOPAUSE   Osteoporosis is a disease in which the bones lose minerals and strength with aging. This can result in serious bone fractures. Your risk for osteoporosis can be identified using a bone density scan.  If you are 66 years of age or older, or if you are at risk for osteoporosis and fractures, ask your health care provider if you should be screened.  Ask your health care provider whether you should take a calcium or vitamin D supplement to lower your risk for osteoporosis.  Menopause may have certain physical symptoms and risks.  Hormone replacement therapy may reduce some of these symptoms and risks. Talk to your health care provider about whether hormone replacement therapy is right for you.  HOME CARE INSTRUCTIONS   Schedule regular health, dental, and eye exams.  Stay current with your immunizations.   Do not use any tobacco products including cigarettes, chewing tobacco, or electronic cigarettes.  If you are pregnant, do not drink alcohol.  If you are breastfeeding, limit how much and how often you drink alcohol.  Limit alcohol intake to no more than 1 drink per day for nonpregnant women. One drink equals 12 ounces of beer, 5 ounces of wine, or 1 ounces of hard liquor.  Do not use street drugs.  Do not share needles.  Ask your health care provider for help if you need support or information about quitting drugs.  Tell your health care provider if you often feel depressed.  Tell your health care  provider if you have ever been abused or do not feel safe at home. Document Released: 03/30/2011 Document Revised: 01/29/2014 Document Reviewed: 08/16/2013 [facility] Patient Information 2015 [place], [place]. This information is not intended to replace advice given to you by your health care provider. Make sure you discuss any questions you have with your health care provider.

## 2014-05-22 DIAGNOSIS — Z0000 Encounter for general adult medical examination without abnormal findings: Secondary | ICD-10-CM | POA: Insufficient documentation

## 2014-05-22 LAB — LIPID PANEL
CHOL/HDL RATIO: 4
Cholesterol: 230 mg/dL — ABNORMAL HIGH (ref 0–200)
HDL: 60.8 mg/dL (ref >39.00)
LDL Cholesterol: 154 mg/dL — ABNORMAL HIGH (ref 0–99)
NONHDL: 169.2
Triglycerides: 74 mg/dL (ref 0.0–149.0)
VLDL: 14.8 mg/dL (ref 0.0–40.0)

## 2014-05-22 LAB — TSH: TSH: 0.82 u[IU]/mL (ref 0.35–4.50)

## 2014-05-22 NOTE — Assessment & Plan Note (Signed)
well-controlled on diet alone .  hemoglobin A1c has been consistently less than 7.0 . Patient is up-to-date on eye exams and foot exam was normal today.  There is  no proteinuria on prior micro urinalysis .   Lab Results  Component Value Date   HGBA1C 6.5 05/21/2014   Lab Results  Component Value Date   MICROALBUR 1.3 02/08/2014
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
## 2014-05-22 NOTE — Assessment & Plan Note (Signed)
I have congratulated her in reduction of   BMI and encouraged  Continued weight loss with goal of 10% of body weigh over the next 6 months using a low glycemic index diet and regular exercise a minimum of 5 days per week.    

## 2014-05-22 NOTE — Assessment & Plan Note (Addendum)
Thyroid function is pending on current dose.  No current changes needed.   Lab Results  Component Value Date   TSH 1.32 08/16/2013

## 2014-05-22 NOTE — Assessment & Plan Note (Signed)
Surveillance is ongoing managed by Dr . Grayland Ormond,  Last mammogram may 2015.  Breast exam done today notes scar tissue right breast and ink markers

## 2014-05-22 NOTE — Assessment & Plan Note (Signed)
Annual Medicare wellness  exam was done as well as a comprehensive physical exam and management of acute and chronic conditions .  Health maintenance screenings have ben addressed and hand out given .

## 2014-05-26 ENCOUNTER — Encounter: Payer: Self-pay | Admitting: Internal Medicine

## 2014-05-28 NOTE — Telephone Encounter (Signed)
Mailed unread message to pt  

## 2014-07-10 ENCOUNTER — Other Ambulatory Visit: Payer: Self-pay | Admitting: *Deleted

## 2014-07-10 DIAGNOSIS — E559 Vitamin D deficiency, unspecified: Secondary | ICD-10-CM

## 2014-07-10 MED ORDER — LEVOTHYROXINE SODIUM 100 MCG PO TABS: ORAL_TABLET | ORAL | Status: DC

## 2014-07-18 ENCOUNTER — Ambulatory Visit (INDEPENDENT_AMBULATORY_CARE_PROVIDER_SITE_OTHER): Payer: Commercial Managed Care - HMO

## 2014-07-18 DIAGNOSIS — Z23 Encounter for immunization: Secondary | ICD-10-CM

## 2014-08-01 ENCOUNTER — Emergency Department: Payer: Self-pay | Admitting: Emergency Medicine

## 2014-08-02 ENCOUNTER — Telehealth: Payer: Self-pay | Admitting: Internal Medicine

## 2014-08-02 NOTE — Telephone Encounter (Signed)
6:30 Monday night acute issues only

## 2014-08-02 NOTE — Telephone Encounter (Signed)
Have not been able to reach patient but have sent for records as requested.

## 2014-08-02 NOTE — Telephone Encounter (Signed)
Pt called back, pt is scheduled for 6:30 Monday. Thanks!
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
## 2014-08-02 NOTE — Telephone Encounter (Signed)
Left message for pt to return my call.
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
## 2014-08-02 NOTE — Telephone Encounter (Signed)
Please advise I see nothing until 09/11/14
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
## 2014-08-06 ENCOUNTER — Ambulatory Visit (INDEPENDENT_AMBULATORY_CARE_PROVIDER_SITE_OTHER): Payer: Commercial Managed Care - HMO | Admitting: Internal Medicine

## 2014-08-06 ENCOUNTER — Encounter: Payer: Self-pay | Admitting: Internal Medicine

## 2014-08-06 VITALS — BP 138/72 | HR 84 | Temp 97.8°F | Resp 16 | Wt 180.5 lb

## 2014-08-06 DIAGNOSIS — E038 Other specified hypothyroidism: Secondary | ICD-10-CM

## 2014-08-06 DIAGNOSIS — E559 Vitamin D deficiency, unspecified: Secondary | ICD-10-CM

## 2014-08-06 DIAGNOSIS — T148 Other injury of unspecified body region: Secondary | ICD-10-CM

## 2014-08-06 DIAGNOSIS — T148XXA Other injury of unspecified body region, initial encounter: Secondary | ICD-10-CM

## 2014-08-06 MED ORDER — LEVOTHYROXINE SODIUM 88 MCG PO TABS: ORAL_TABLET | ORAL | Status: DC
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
## 2014-08-06 NOTE — Patient Instructions (Signed)
Your pain is due to muscles contusions and spasm  Your would benefit from a good massage,  Continued use of ice for 15 minutes on painful joints.  You could also try salon Pas patches for the aching joints

## 2014-08-06 NOTE — Progress Notes (Signed)
Pre-visit discussion using our clinic review tool. No additional management support is needed unless otherwise documented below in the visit note.  

## 2014-08-07 ENCOUNTER — Telehealth: Payer: Self-pay | Admitting: Internal Medicine

## 2014-08-07 NOTE — Telephone Encounter (Signed)
I recommended a therapeutic massage to Jacqueline Mata but did not have Jacqueline Mata contact information.  I now have it.  The therapist I recommend is Jacqueline Mata.   Jacqueline Mata's number is [phone]

## 2014-08-08 ENCOUNTER — Encounter: Payer: Self-pay | Admitting: Internal Medicine

## 2014-08-08 DIAGNOSIS — T148XXA Other injury of unspecified body region, initial encounter: Secondary | ICD-10-CM | POA: Insufficient documentation
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
## 2014-08-08 NOTE — Telephone Encounter (Signed)
Tried to call patient no answer/no voicemail

## 2014-08-08 NOTE — Assessment & Plan Note (Signed)
Now takinf 88 mcg daily will recheck TSH in 6 weeks

## 2014-08-14 NOTE — Telephone Encounter (Signed)
Patient notified as requested. 

## 2014-08-29 ENCOUNTER — Other Ambulatory Visit: Payer: Self-pay | Admitting: *Deleted

## 2014-08-29 DIAGNOSIS — E559 Vitamin D deficiency, unspecified: Secondary | ICD-10-CM

## 2014-08-29 DIAGNOSIS — C44711 Basal cell carcinoma of skin of unspecified lower limb, including hip: Secondary | ICD-10-CM

## 2014-08-29 MED ORDER — AZELASTINE HCL 0.1 % NA SOLN: 2.0000 | Freq: Two times a day (BID) | NASAL | Status: DC | PRN

## 2014-09-03 ENCOUNTER — Ambulatory Visit: Payer: Commercial Managed Care - HMO | Admitting: Internal Medicine

## 2014-10-02 ENCOUNTER — Other Ambulatory Visit: Payer: Self-pay | Admitting: *Deleted

## 2014-10-02 DIAGNOSIS — D0590 Unspecified type of carcinoma in situ of unspecified breast: Secondary | ICD-10-CM

## 2014-10-03 ENCOUNTER — Encounter: Payer: Self-pay | Admitting: Hematology & Oncology

## 2014-10-03 ENCOUNTER — Ambulatory Visit (HOSPITAL_BASED_OUTPATIENT_CLINIC_OR_DEPARTMENT_OTHER): Payer: Medicare Other | Admitting: Hematology & Oncology

## 2014-10-03 ENCOUNTER — Other Ambulatory Visit (HOSPITAL_BASED_OUTPATIENT_CLINIC_OR_DEPARTMENT_OTHER): Payer: Medicare Other | Admitting: Lab

## 2014-10-03 VITALS — BP 123/79 | HR 100 | Temp 97.9°F | Resp 16 | Ht 65.0 in | Wt 179.0 lb

## 2014-10-03 DIAGNOSIS — D0590 Unspecified type of carcinoma in situ of unspecified breast: Secondary | ICD-10-CM | POA: Diagnosis not present

## 2014-10-03 DIAGNOSIS — Z853 Personal history of malignant neoplasm of breast: Secondary | ICD-10-CM

## 2014-10-03 DIAGNOSIS — D0591 Unspecified type of carcinoma in situ of right breast: Secondary | ICD-10-CM

## 2014-10-03 LAB — COMPREHENSIVE METABOLIC PANEL
ALK PHOS: 64 U/L (ref 39–117)
ALT: 22 U/L (ref 0–35)
AST: 21 U/L (ref 0–37)
Albumin: 4.4 g/dL (ref 3.5–5.2)
BILIRUBIN TOTAL: 0.8 mg/dL (ref 0.2–1.2)
BUN: 16 mg/dL (ref 6–23)
CO2: 24 mEq/L (ref 19–32)
Calcium: 9.5 mg/dL (ref 8.4–10.5)
Chloride: 104 mEq/L (ref 96–112)
Creatinine, Ser: 0.88 mg/dL (ref 0.50–1.10)
Glucose, Bld: 91 mg/dL (ref 70–99)
Potassium: 4.1 mEq/L (ref 3.5–5.3)
Sodium: 139 mEq/L (ref 135–145)
Total Protein: 7.1 g/dL (ref 6.0–8.3)

## 2014-10-03 LAB — CBC WITH DIFFERENTIAL (CANCER CENTER ONLY)
BASO#: 0.1 10*3/uL (ref 0.0–0.2)
BASO%: 0.6 % (ref 0.0–2.0)
EOS%: 3.4 % (ref 0.0–7.0)
Eosinophils Absolute: 0.3 10*3/uL (ref 0.0–0.5)
HEMATOCRIT: 41.5 % (ref 34.8–46.6)
HGB: 14.4 g/dL (ref 11.6–15.9)
LYMPH#: 2.9 10*3/uL (ref 0.9–3.3)
LYMPH%: 32.4 % (ref 14.0–48.0)
MCH: 32.1 pg (ref 26.0–34.0)
MCHC: 34.7 g/dL (ref 32.0–36.0)
MCV: 92 fL (ref 81–101)
MONO#: 0.5 10*3/uL (ref 0.1–0.9)
MONO%: 5.5 % (ref 0.0–13.0)
NEUT%: 58.1 % (ref 39.6–80.0)
NEUTROS ABS: 5.2 10*3/uL (ref 1.5–6.5)
PLATELETS: 174 10*3/uL (ref 145–400)
RBC: 4.49 10*6/uL (ref 3.70–5.32)
RDW: 13.3 % (ref 11.1–15.7)
WBC: 9 10*3/uL (ref 3.9–10.0)

## 2014-10-03 NOTE — Progress Notes (Signed)
Hematology and Oncology Follow Up Visit  HO[place] OH [id number] Feb 05, 1941 74 y.o. 10/03/2014   Principle Diagnosis:  Synchronous bilateral stage IIA (T2 N0 M0) ductal carcinoma of bilateral breast.  Current Therapy:    observation     Interim History:  Ms.  Schumm is comes in for followup. Last saw her back in June. We see her every 6 months. She had a good summer and fall. She had a good holiday season. She really did not go anywhere. She has family coming down to visit her from [place].  She's had no problems with bones. She's had no nausea or vomiting. She's had no change in bowel or bladder habits. She's had no fever. There's been no issues with infections.   Her last mammogram was done in May of 2015. The mammogram did not show any evidence of recurrent or a second breast cancers.    Medications: Current outpatient prescriptions: aspirin 81 MG tablet, Take 81 mg by mouth daily., Disp: , Rfl: ;  azelastine (ASTELIN) 0.1 % nasal spray, Place 2 sprays into both nostrils 2 (two) times daily as needed for rhinitis. Use in each nostril as directed, Disp: 30 mL, Rfl: 5;  Blood Glucose Monitoring Suppl KIT, by Does not apply route., Disp: , Rfl: ;  Cholecalciferol (VITAMIN D3) 1000 UNITS CAPS, Take 1 capsule by mouth daily., Disp: , Rfl:  fluocinonide cream (LIDEX) 4.16 %, Apply 1 application topically daily as needed., Disp: , Rfl: ;  fluticasone (FLONASE) 50 MCG/ACT nasal spray, Place into both nostrils daily. Place 2 spriay into both nostrils daily, Disp: , Rfl: ;  folic acid (FOLVITE) 1 MG tablet, Take 1 mg by mouth daily., Disp: , Rfl: ;  levothyroxine (SYNTHROID, LEVOTHROID) 88 MCG tablet, 1 tablet daily before breakfast, Disp: 90 tablet, Rfl: 1 metroNIDAZOLE (METROGEL) 1 % gel, Apply topically 2 (two) times daily. to face, Disp: , Rfl: 0  Allergies:  Allergies  Allergen Reactions  . Ilevro [Nepafenac]   . Adhesive [Tape] Dermatitis  . Codeine   . Latex   . Mold Extract  [Trichophyton Mentagrophyte]   . Neomycin   . Nucynta [Tapentadol] Nausea And Vomiting  . Tramadol     dizziness    Past Medical History, Surgical history, Social history, and Family History were reviewed and updated.  Review of Systems: As above  Physical Exam:  height is 5' 5" (1.651 m) and weight is 179 lb (81.194 kg). Her oral temperature is 97.9 F (36.6 C). Her blood pressure is 123/79 and her pulse is 100. Her respiration is 16.   Well-developed and well-nourished white female. Head and exam has no ocular or oral lesions. She has no palpable cervical or supraclavicular lymph nodes. Lungs are clear bilateral. Cardiac exam regular rate and rhythm with no murmurs rubs or bruits. Abdomen is soft. Has good bowel sounds. There is no fluid wave. Is a palpable liver or spleen tip. Breast exam shows left breast with well-healed lumpectomy at the 9:00 position. No masses noted in the left breast. There is no left axillary adenopathy. Right breast is somewhat contracted from radiation and surgery. She has a well-healed lumpectomy at the 4:00 position. There is no mass in the right breast. There is no right axillary adenopathy. Extremities shows no clubbing cyanosis or edema. Neurological exam shows no focal neurological deficits. Skin exam no rashes ecchymosis or petechia. Lab Results  Component Value Date   WBC 9.0 10/03/2014   HGB 14.4 10/03/2014   HCT 41.5 10/03/2014  MCV 92 10/03/2014   PLT 174 10/03/2014     Chemistry      Component Value Date/Time   NA 138 05/21/2014 1138   K 4.0 05/21/2014 1138   CL 105 05/21/2014 1138   CO2 24 05/21/2014 1138   BUN 14 05/21/2014 1138   CREATININE 0.8 05/21/2014 1138   CREATININE 0.85 12/30/2012 1703      Component Value Date/Time   CALCIUM 9.6 05/21/2014 1138   ALKPHOS 59 05/21/2014 1138   AST 23 05/21/2014 1138   ALT 21 05/21/2014 1138   BILITOT 0.9 05/21/2014 1138         Impression and Plan: Ms. Windle is 74 year old white  female with history of synchronous bilateral breast cancer. Fortunately both breast cancers were node negative.  For now, we will plan to get her back in another 6 months.  Has been over 10 years since she had treatment. Again she's done incredibly well. I really think that she is cured.   Volanda Napoleon, MD 1/6/20165:12 PM

## 2014-10-08 ENCOUNTER — Ambulatory Visit: Payer: Commercial Managed Care - HMO | Admitting: Internal Medicine

## 2014-10-11 DIAGNOSIS — D225 Melanocytic nevi of trunk: Secondary | ICD-10-CM | POA: Diagnosis not present

## 2014-10-11 DIAGNOSIS — Z85828 Personal history of other malignant neoplasm of skin: Secondary | ICD-10-CM | POA: Diagnosis not present

## 2014-10-11 DIAGNOSIS — L570 Actinic keratosis: Secondary | ICD-10-CM | POA: Diagnosis not present

## 2014-10-11 DIAGNOSIS — D2271 Melanocytic nevi of right lower limb, including hip: Secondary | ICD-10-CM | POA: Diagnosis not present

## 2014-10-15 ENCOUNTER — Ambulatory Visit (INDEPENDENT_AMBULATORY_CARE_PROVIDER_SITE_OTHER): Payer: Medicare Other | Admitting: Internal Medicine

## 2014-10-15 ENCOUNTER — Encounter: Payer: Self-pay | Admitting: Internal Medicine

## 2014-10-15 VITALS — BP 118/78 | HR 93 | Temp 98.1°F | Resp 16 | Ht 65.0 in | Wt 178.2 lb

## 2014-10-15 DIAGNOSIS — M5432 Sciatica, left side: Secondary | ICD-10-CM

## 2014-10-15 DIAGNOSIS — M5442 Lumbago with sciatica, left side: Secondary | ICD-10-CM
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
## 2014-10-15 NOTE — Patient Instructions (Signed)
Plain films of thoracic and lumbar spine have been ordered

## 2014-10-15 NOTE — Progress Notes (Signed)
Pre-visit discussion using our clinic review tool. No additional management support is needed unless otherwise documented below in the visit note.  

## 2014-10-16 DIAGNOSIS — H26491 Other secondary cataract, right eye: Secondary | ICD-10-CM | POA: Diagnosis not present

## 2014-10-17 ENCOUNTER — Encounter: Payer: Self-pay | Admitting: Internal Medicine

## 2014-10-17 ENCOUNTER — Ambulatory Visit (INDEPENDENT_AMBULATORY_CARE_PROVIDER_SITE_OTHER)
Admission: RE | Admit: 2014-10-17 | Discharge: 2014-10-17 | Disposition: A | Discharge status: Home / Self Care | Payer: Medicare Other | Source: Ambulatory Visit | Attending: Internal Medicine | Admitting: Internal Medicine

## 2014-10-17 DIAGNOSIS — M5136 Other intervertebral disc degeneration, lumbar region: Secondary | ICD-10-CM | POA: Diagnosis not present

## 2014-10-17 DIAGNOSIS — M5432 Sciatica, left side: Secondary | ICD-10-CM | POA: Insufficient documentation

## 2014-10-17 DIAGNOSIS — M5134 Other intervertebral disc degeneration, thoracic region: Secondary | ICD-10-CM | POA: Diagnosis not present

## 2014-10-17 DIAGNOSIS — M5442 Lumbago with sciatica, left side: Secondary | ICD-10-CM | POA: Diagnosis not present

## 2014-10-17 NOTE — Assessment & Plan Note (Signed)
L4-L5 disk herniation suggested by lumbar films, with scoliosis and degenerative changes at multiple levels also contributing.  MRi advised

## 2014-10-20 LAB — HM DIABETES EYE EXAM

## 2014-11-21 ENCOUNTER — Ambulatory Visit: Payer: Commercial Managed Care - HMO | Admitting: Internal Medicine

## 2014-12-19 ENCOUNTER — Ambulatory Visit (INDEPENDENT_AMBULATORY_CARE_PROVIDER_SITE_OTHER): Payer: Medicare Other | Admitting: Internal Medicine

## 2014-12-19 ENCOUNTER — Encounter: Payer: Self-pay | Admitting: Internal Medicine

## 2014-12-19 VITALS — BP 118/70 | HR 80 | Temp 97.8°F | Resp 14 | Ht 65.0 in | Wt 175.8 lb

## 2014-12-19 DIAGNOSIS — M5432 Sciatica, left side: Secondary | ICD-10-CM

## 2014-12-19 DIAGNOSIS — M5440 Lumbago with sciatica, unspecified side: Secondary | ICD-10-CM

## 2014-12-19 DIAGNOSIS — E038 Other specified hypothyroidism: Secondary | ICD-10-CM

## 2014-12-19 DIAGNOSIS — E119 Type 2 diabetes mellitus without complications: Secondary | ICD-10-CM | POA: Diagnosis not present

## 2014-12-19 DIAGNOSIS — M25519 Pain in unspecified shoulder: Secondary | ICD-10-CM

## 2014-12-19 DIAGNOSIS — M21969 Unspecified acquired deformity of unspecified lower leg: Secondary | ICD-10-CM

## 2014-12-19 DIAGNOSIS — E034 Atrophy of thyroid (acquired): Secondary | ICD-10-CM

## 2014-12-19 DIAGNOSIS — M25511 Pain in right shoulder: Secondary | ICD-10-CM

## 2014-12-19 DIAGNOSIS — M25512 Pain in left shoulder: Secondary | ICD-10-CM

## 2014-12-19 LAB — HM DIABETES FOOT EXAM: HM Diabetic Foot Exam: NORMAL

## 2014-12-19 MED ORDER — GLUCOSE BLOOD VI STRP: ORAL_STRIP | Status: DC

## 2014-12-19 MED ORDER — GLUCOSE BLOOD VI STRP
ORAL_STRIP | Status: DC
Start: 2014-12-19 — End: 2014-12-19

## 2014-12-19 MED ORDER — ACCU-CHEK MULTICLIX LANCETS MISC: Status: DC

## 2014-12-19 NOTE — Telephone Encounter (Signed)
Pt was notified of results at appt today per [name]

## 2014-12-19 NOTE — Patient Instructions (Addendum)
MRI of lumbar spine to be ordered ordered  Labs in 3 weeks or more once you have been on generic synthroid for 6 weeks  Referral to Dr Charlann Boxer (sports medicine)  For shoulder pain   [name] will refill your test strips and lancets for Accu check machine to [facility] to take echinacea daily
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
## 2014-12-20 ENCOUNTER — Other Ambulatory Visit: Payer: Self-pay | Admitting: *Deleted

## 2014-12-20 MED ORDER — GLUCOSE BLOOD VI STRP: ORAL_STRIP | Status: DC

## 2014-12-21 ENCOUNTER — Encounter: Payer: Self-pay | Admitting: Internal Medicine

## 2014-12-21 DIAGNOSIS — M25512 Pain in left shoulder: Secondary | ICD-10-CM | POA: Insufficient documentation

## 2014-12-21 DIAGNOSIS — M25511 Pain in right shoulder: Secondary | ICD-10-CM | POA: Insufficient documentation

## 2014-12-21 DIAGNOSIS — M21969 Unspecified acquired deformity of unspecified lower leg: Secondary | ICD-10-CM | POA: Insufficient documentation

## 2014-12-21 NOTE — Assessment & Plan Note (Signed)
Now taking88 mcg daily will recheck TSH   Lab Results  Component Value Date   TSH 0.82 05/21/2014

## 2014-12-21 NOTE — Assessment & Plan Note (Signed)
well-controlled on diet alone .  hemoglobin A1c has not been checked since August but has been consistently less than 7.0 . Patient is up-to-date on eye exams and foot exam was normal today.  There is  no proteinuria on prior micro urinalysis .   Lab Results  Component Value Date   HGBA1C 6.5 05/21/2014   Lab Results  Component Value Date   MICROALBUR 1.3 02/08/2014

## 2014-12-21 NOTE — Assessment & Plan Note (Signed)
Chronic,  Reassurance provided that the asymmetry was due to knee surgery and venous insufficiency

## 2014-12-21 NOTE — Assessment & Plan Note (Signed)
MRI lumbar spine ordered to rule out spinal stenosis and sciatic nerve impingement

## 2014-12-21 NOTE — Assessment & Plan Note (Signed)
No historyof trauma or overuse.  Pain is anterior on the [facility] joint .  Bursitis suspected.  Will refer to Charlann Boxer

## 2014-12-31 ENCOUNTER — Other Ambulatory Visit: Payer: Self-pay | Admitting: *Deleted

## 2014-12-31 MED ORDER — ACCU-CHEK MULTICLIX LANCETS MISC: Status: DC

## 2015-01-08 ENCOUNTER — Encounter: Payer: Self-pay | Admitting: Family Medicine

## 2015-01-08 ENCOUNTER — Ambulatory Visit (INDEPENDENT_AMBULATORY_CARE_PROVIDER_SITE_OTHER): Payer: Medicare Other | Admitting: Family Medicine

## 2015-01-08 VITALS — BP 112/80 | HR 94 | Ht 65.0 in | Wt 180.0 lb

## 2015-01-08 DIAGNOSIS — M25512 Pain in left shoulder: Secondary | ICD-10-CM

## 2015-01-08 DIAGNOSIS — M25511 Pain in right shoulder: Secondary | ICD-10-CM | POA: Diagnosis not present

## 2015-01-08 DIAGNOSIS — M67912 Unspecified disorder of synovium and tendon, left shoulder: Secondary | ICD-10-CM

## 2015-01-08 DIAGNOSIS — M75101 Unspecified rotator cuff tear or rupture of right shoulder, not specified as traumatic: Secondary | ICD-10-CM | POA: Diagnosis not present

## 2015-01-08 DIAGNOSIS — M67919 Unspecified disorder of synovium and tendon, unspecified shoulder: Secondary | ICD-10-CM | POA: Insufficient documentation

## 2015-01-08 DIAGNOSIS — M75102 Unspecified rotator cuff tear or rupture of left shoulder, not specified as traumatic: Secondary | ICD-10-CM | POA: Diagnosis not present

## 2015-01-08 DIAGNOSIS — M67911 Unspecified disorder of synovium and tendon, right shoulder: Secondary | ICD-10-CM

## 2015-01-08 NOTE — Progress Notes (Signed)
Pre visit review using our clinic review tool, if applicable. No additional management support is needed unless otherwise documented below in the visit note. 
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
## 2015-01-08 NOTE — Patient Instructions (Addendum)
Good to see you.  Ice 20 minutes 2 times daily. Usually after activity and before bed. Exercises 3 times a week.  Try pennsaid twice daily as needed Glucosamine sulfate 1500mg  a day is a supplement that has been shown to help moderate to severe arthritis. Vitamin D 2000 IU daily Fish oil 2 grams daily.  Tumeric 500mg  twice daily.  Capsaicin topically up to four times a day may also help with pain. Come back and see me in 3-4 weeks.

## 2015-01-08 NOTE — Assessment & Plan Note (Signed)
Patient given bilateral injections today. We discussed her watching her blood sugars more carefully. We discussed icing regimen and home exercises. Patient data make these changes and come back and see me again in 3-4 weeks for further evaluation and treatment.

## 2015-01-10 ENCOUNTER — Telehealth: Payer: Self-pay

## 2015-01-10 MED ORDER — DIAZEPAM 5 MG PO TABS: 5.0000 mg | ORAL_TABLET | Freq: Every day | ORAL | Status: DC | PRN

## 2015-01-10 NOTE — Telephone Encounter (Signed)
The patient called and is hoping to have an rx for valium called in due to having anxiety related to her MRI scheduled for tomorrow.   [facility] on [address]  Pt callback - [phone]

## 2015-01-10 NOTE — Telephone Encounter (Signed)
Please advise 

## 2015-01-10 NOTE — Telephone Encounter (Signed)
Patient notified and voiced understanding.

## 2015-01-10 NOTE — Telephone Encounter (Signed)
rx printed.  Remind patietn that she was lucky I was here to handle on such short notice

## 2015-01-11 ENCOUNTER — Ambulatory Visit
Admission: RE | Admit: 2015-01-11 | Discharge: 2015-01-11 | Disposition: A | Discharge status: Home / Self Care | Payer: Medicare Other | Source: Ambulatory Visit | Attending: Internal Medicine | Admitting: Internal Medicine

## 2015-01-11 ENCOUNTER — Telehealth: Payer: Self-pay | Admitting: Family Medicine

## 2015-01-11 DIAGNOSIS — M67912 Unspecified disorder of synovium and tendon, left shoulder: Principal | ICD-10-CM

## 2015-01-11 DIAGNOSIS — Z87828 Personal history of other (healed) physical injury and trauma: Secondary | ICD-10-CM | POA: Diagnosis not present

## 2015-01-11 DIAGNOSIS — M5440 Lumbago with sciatica, unspecified side: Secondary | ICD-10-CM

## 2015-01-11 DIAGNOSIS — M67911 Unspecified disorder of synovium and tendon, right shoulder: Secondary | ICD-10-CM

## 2015-01-11 DIAGNOSIS — M5127 Other intervertebral disc displacement, lumbosacral region: Secondary | ICD-10-CM | POA: Diagnosis not present

## 2015-01-11 DIAGNOSIS — M47817 Spondylosis without myelopathy or radiculopathy, lumbosacral region: Secondary | ICD-10-CM | POA: Diagnosis not present

## 2015-01-11 NOTE — Telephone Encounter (Signed)
Referral entered & sent to [facility].

## 2015-01-11 NOTE — Telephone Encounter (Signed)
Patient called and Dr. Tamala Julian is going to send a prescription for physical therapy over to [facility]. She called over there and they have not received it yet. Their phone # is [phone] and fax is [phone].

## 2015-01-15 ENCOUNTER — Encounter: Payer: Self-pay | Admitting: Internal Medicine

## 2015-01-15 DIAGNOSIS — M545 Low back pain: Secondary | ICD-10-CM

## 2015-01-16 NOTE — Telephone Encounter (Signed)
My Chart message sent

## 2015-01-17 ENCOUNTER — Other Ambulatory Visit (INDEPENDENT_AMBULATORY_CARE_PROVIDER_SITE_OTHER): Payer: Medicare Other

## 2015-01-17 DIAGNOSIS — M67912 Unspecified disorder of synovium and tendon, left shoulder: Secondary | ICD-10-CM | POA: Diagnosis not present

## 2015-01-17 DIAGNOSIS — E119 Type 2 diabetes mellitus without complications: Secondary | ICD-10-CM

## 2015-01-17 DIAGNOSIS — M67911 Unspecified disorder of synovium and tendon, right shoulder: Secondary | ICD-10-CM | POA: Diagnosis not present

## 2015-01-17 LAB — LIPID PANEL
Cholesterol: 226 mg/dL — ABNORMAL HIGH (ref 0–200)
HDL: 75.4 mg/dL (ref >39.00)
LDL CALC: 140 mg/dL — AB (ref 0–99)
NONHDL: 150.6
Total CHOL/HDL Ratio: 3
Triglycerides: 55 mg/dL (ref 0.0–149.0)
VLDL: 11 mg/dL (ref 0.0–40.0)

## 2015-01-17 LAB — HEMOGLOBIN A1C: Hgb A1c MFr Bld: 6.6 % — ABNORMAL HIGH (ref 4.6–6.5)

## 2015-01-17 LAB — MICROALBUMIN / CREATININE URINE RATIO
CREATININE, U: 176.2 mg/dL
MICROALB UR: 1.7 mg/dL (ref 0.0–1.9)
MICROALB/CREAT RATIO: 1 mg/g (ref 0.0–30.0)

## 2015-01-17 NOTE — Telephone Encounter (Signed)
Please advise,

## 2015-01-18 NOTE — Op Note (Signed)
PATIENT NAME:  Jacqueline Mata, Jacqueline Mata MR#:  [id number] DATE OF BIRTH:  [birth year]  DATE OF PROCEDURE:  01/04/2013  PREOPERATIVE DIAGNOSIS:  Senile cataract left eye.  POSTOPERATIVE DIAGNOSIS:  Senile cataract left eye.  PROCEDURE:  Phacoemulsification with posterior chamber intraocular lens implantation of the left eye.  LENS:  ZCB00 19.0-diopter posterior chamber intraocular lens.  ULTRASOUND TIME:  10% of 1 minute, 4 seconds.  CDE 6.3.  SURGEON:  Mali Jaeanna Mccomber, MD  ANESTHESIA:  Topical with tetracaine drops and 2% Xylocaine jelly.  COMPLICATIONS:  None.  DESCRIPTION OF PROCEDURE:  The patient was identified in the holding room and transported to the operating room and placed in the supine position under the operating microscope.  The left eye was identified as the operative eye and it was prepped and draped in the usual sterile ophthalmic fashion.  A 1 millimeter clear-corneal paracentesis was made at the 1:30 position.  The anterior chamber was filled with Viscoat viscoelastic.  A 2.4 millimeter keratome was used to make a near-clear corneal incision at the 10:30 position.  A curvilinear capsulorrhexis was made with a cystotome and capsulorrhexis forceps.  Balanced salt solution was used to hydrodissect and hydrodelineate the nucleus.  Phacoemulsification was then used in stop and chop fashion to remove the lens nucleus and epinucleus.  The remaining cortex was then removed using the irrigation and aspiration handpiece. Provisc was then placed into the capsular bag to distend it for lens placement.  A ZCB00 19.0-diopter lens was then injected into the capsular bag.  The remaining viscoelastic was aspirated.  Wounds were hydrated with balanced salt solution.  The anterior chamber was inflated to a physiologic pressure with balanced salt solution.  0.1 mL of cefuroxime 10 mg/mL were injected into the anterior chamber for a dose of 1 mg of intracameral antibiotic at the completion of the case.  No wound leaks were noted. Topical Vigamox drops and timolol drops were applied to the eye. No ointment was used. The patient was taken to the recovery room in stable condition without complications of anesthesia or surgery.   ____________________________ Wyonia Hough, MD crb:ms D: 01/04/2013 15:12:28 ET T: 01/04/2013 22:40:14 ET JOB#: [id number]  cc: Wyonia Hough, MD, <Dictator> Leandrew Koyanagi MD ELECTRONICALLY SIGNED 01/11/2013 11:26

## 2015-01-20 NOTE — Discharge Summary (Signed)
PATIENT NAME:  Jacqueline Mata, Jacqueline Mata MR#:  [id number] DATE OF BIRTH:  1940-11-07  DATE OF ADMISSION:  03/30/2012 DATE OF DISCHARGE:  04/04/2012  ADMITTING DIAGNOSIS: Degenerative arthrosis of the left knee.  DISCHARGE DIAGNOSIS: Degenerative arthrosis of the left knee.   HISTORY: The patient is a pleasant 74 year old who has been followed at [facility] for progression of left knee pain. She had reported a several year history of progressive left knee pain. She did not recall any specific trauma or aggravating event. She reported most of the pain along the medial aspect of the knee but was noted to have some along the lateral aspect as well. Her pain was noted to be aggravated with weight-bearing activities. The patient did not see any significant improvement in her condition despite use of anti-inflammatory medications. The patient had been exercising on a regular basis up until approximately two years ago at which time she had to discontinue because of increased pain. She then begin doing some aquatic exercises which eased off some of her pain. She was experiencing some night pain prior to surgery. The pain had progressed to the point that it was significantly interfering with her activities of daily living. X-rays taken in the [place] showed narrowing of the medial cartilage space with associated varus alignment. She was noted to have osteophytes as well as subchondral sclerosis. After discussion of the risks and benefits of surgical intervention, the patient expressed her understanding of the risks and benefits and agreed with plans for surgical intervention.   PROCEDURE: Left total knee arthroplasty using computer-assisted navigation. Date of surgery 03/30/2012.   ANESTHESIA: Femoral nerve block with spinal.   SOFT TISSUE RELEASE: Anterior cruciate ligament, posterior cruciate ligament and deep medial collateral ligaments, as well as the patellofemoral ligament.    IMPLANTS UTILIZED: DePuy PFC Sigma size 2.5 posterior stabilized femoral component (cemented), size 2 MBT tibial component (cemented), 35 mm three pegged oval dome patella (cemented), and a 10 mm stabilized rotating platform polyethylene insert.   HOSPITAL COURSE: The patient tolerated the procedure very well. She had no complications. She was then taken to the PAC-U where she was stabilized and then transferred to the orthopedic floor. She began receiving anticoagulation therapy of Lovenox 30 mg subcutaneous every 12 hours per anesthesia and pharmacy protocol. She was fitted with TED stockings bilaterally. These were allowed to be removed one hour per eight hour shift. The left one was applied on day two after removal of the Hemovac and dressing change. She was also fitted with the AV-I compression foot pumps bilaterally set at 80 mmHg. She has had no evidence of any deep venous thromboses to the lower extremity. Calves have been nontender. Heels were elevated off the bed using rolled towels.   The patient has denied any chest pain or shortness of breath. Her vital signs have been stable. She has been afebrile. Hemodynamically she was stable and no transfusions were given other than the Autovac transfusion given the first six hours postoperatively.   The patient began receiving physical therapy on day one for gait training and transfers. She has progressed very well. However, she has not met criteria for going home safely and subsequently a bed search was performed for rehab placement. Occupational therapy was also initiated on day one for activities of daily living and assistive devices.   The patient's IV, Hemovac, and Foley were discontinued on day two along with a dressing change. The wound has been free of any drainage or signs  of infection. Polar Care was reapplied to the surgical leg maintaining a temperature of 40 to 50 degrees Fahrenheit.   DISPOSITION: The patient is being discharged to a  skilled nursing facility in improved stable condition.   DISCHARGE INSTRUCTIONS:  1. She may weight bear as tolerated. She will need to use a knee immobilizer until she is able to do 10 straight leg raises on her own.  2. Continue high TED stockings. These are allowed to be removed one hour per eight hour shift.  3. Incentive spirometer every 1 hour while awake. Encourage cough and deep breathing every 2 hours while awake.  4. Polar Care maintaining a temperature of 40 to 50 degrees Fahrenheit to the surgical leg.  5. Physical therapy for gait training and transfers. Occupational therapy for activities of daily living and assistive devices.  6. She is placed on an 1800 calorie diet.  7. Follow-up appointment in two weeks. She is to call the clinic sooner if any temperatures of 101.5 or greater. Also call for any excessive bleeding.  8. Elevate heels off the bed.   ALLERGIES: All narcotic pain medicines, codeine, iodine, morphine, neomycin, Tramadol, shellfish, dust, and tape.  MEDICATIONS:  1. Tylenol ES 500 to 1000 mg every four hours p.r.n.  2. Celebrex 200 mg twice a day. 3. Nucynta 50 to 100 mg every four hours p.r.n.  4. Aromasin 25 mg every a.m.  5. Astelin nasal spray one spray to both nostrils every 12 hours. 6. Folic acid 1 mg daily. 7. Synthroid 0.088 mg (Monday 6:00, Wednesday 6:00, Friday 6:00). 8. Synthroid 0.1 mg Sunday, Tuesday, Thursday, and Saturday at 6:00. 9. Vitamin D3 1000 units daily. 10. Saline ultra drops one drop to both eyes every four hours p.r.n.  11. Dulcolax suppository 10 mg rectally daily p.r.n.  12. Milk of Magnesia 30 mL twice a day. 13. Mylanta DS 30 mL every 6 hours p.r.n.  14. Pantoprazole 40 mg twice a day. 15. Senokot-S 1 tablet twice a day. 16. Lovenox 30 mg subcutaneous every 12 hours for 14 days then discontinue and begin taking one 81 mg enteric-coated aspirin.   PAST MEDICAL HISTORY:  1. Breast cancer.  2. Hyperlipidemia.  3. Seasonal  allergies.  4. Diabetes. 5. Thyroid disease.  6. Depression.          7. Arthritis.  8. Cataracts. ____________________________ Vance Peper, PA jrw:slb D: 04/04/2012 14:35:01 ET T: 04/04/2012 15:01:32 ET JOB#: [id number]  cc: Vance Peper, PA, <Dictator> Deshundra Waller PA ELECTRONICALLY SIGNED 04/05/2012 7:33

## 2015-01-20 NOTE — Op Note (Signed)
PATIENT NAME:  Jacqueline Mata, Jacqueline Mata MR#:  [id number] DATE OF BIRTH:  15-Dec-1940  DATE OF PROCEDURE:  03/30/2012  PREOPERATIVE DIAGNOSIS: Degenerative arthrosis of the left knee.   POSTOPERATIVE DIAGNOSIS: Degenerative arthrosis of the left knee.   PROCEDURE PERFORMED: Left total knee arthroplasty using computer-assisted navigation.   SURGEON: Laurice Record. Hooten, M.D.   ASSISTANT: Vance Peper, PA-C (required to maintain retraction throughout the procedure).   ANESTHESIA: Femoral nerve block and spinal.   ESTIMATED BLOOD LOSS: 100 mL.  FLUIDS REPLACED: 1,400 mL of crystalloid.  TOURNIQUET TIME: 78 minutes.   DRAINS: Two medium drains to reinfusion system.   SOFT TISSUE RELEASES: Anterior cruciate ligament, posterior cruciate ligament, deep medial collateral ligament, patellofemoral ligament.   IMPLANTS UTILIZED: DePuy PFC Sigma size 2.5 posterior stabilized femoral component (cemented), size 2 MBT tibial component (cemented), 35 mm three peg oval dome patella (cemented), and a 10 mm stabilized rotating platform polyethylene insert.   INDICATIONS FOR SURGERY: The patient is a 74 year old female who has been seen for complaints of progressive left knee pain. X-rays demonstrated significant degenerative changes in tricompartmental fashion. After a discussion of the risks and benefits of surgical intervention, the patient expressed her understanding of the risks and benefits and agreed with plans for surgical intervention.   PROCEDURE IN DETAIL: The patient was brought into the Operating Room and, after adequate femoral nerve block and spinal anesthesia was achieved, a tourniquet was placed on the patient's upper left thigh. The patient's left knee and leg were cleaned and prepped with alcohol and DuraPrep and draped in the usual sterile fashion. A "time-out" was performed as per usual protocol. The left lower extremity was exsanguinated using an Esmarch and the tourniquet was inflated to 300 mmHg. An  anterior longitudinal incision was made followed by a standard mid vastus approach. A moderate effusion was evacuated. The deep fibers of the medial collateral ligament were elevated in a subperiosteal fashion off the medial flare of the tibia so as to maintain a continuous soft tissue sleeve. The patella was subluxed laterally and the patellofemoral ligament was incised. Inspection of the knee demonstrated severe degenerative changes in a tricompartmental fashion. Prominent osteophytes were debrided using a rongeur. Anterior and posterior cruciate ligaments were excised. Two 4.0 mm Schanz pins were inserted into the femur and into the tibia for attachment of the array of trackers used for computer-assisted navigation. Hip center was identified using a circumduction technique. Distal landmarks were mapped using the computer. The distal femur and proximal tibia were mapped using the computer. Distal femoral cutting guide was positioned using computer-assisted navigation so as to achieve a 5 degree distal valgus cut. The cut was performed and verified using the computer. The distal femur was sized and it was felt that a size 2.5 femoral component was appropriate. A 2.5 cutting guide was positioned and the anterior cut was performed and verified using the computer. This was followed by completion of the posterior and chamfer cuts. Femoral cutting guide for central box was then positioned and the central box cut was performed.   Attention was then directed to the proximal tibia. Medial and lateral menisci were excised. The extramedullary tibial cutting guide was positioned using computer-assisted navigation so as to achieve a 0 degree varus valgus alignment and 0 degree posterior slope. Cut was performed and verified using the computer. The proximal tibia was sized and it was felt that a size two tibial tray was appropriate. Tibial and femoral trials were inserted followed by insertion of  a 10 mm polyethylene trial.  Excellent mediolateral soft tissue balancing was appreciated both in full extension and in flexion. Finally, the patella was cut and prepared so as to accommodate a 35 mm three peg oval dome patella. Patellar trial was placed and the knee was placed through a range of motion with excellent patellar tracking appreciated.   Femoral trial was removed. Central post hole for the tibial component was reamed followed by insertion of a keel punch. Tibial trials were then removed. The cut surfaces of bone were irrigated with copious amounts of normal saline with antibiotic solution using pulsatile lavage and then suctioned dry. Polymethylmethacrylate cement with gentamicin was prepared in the usual fashion using a vacuum mixer. Gentamicin cement was used due to the patient's history of diabetes. Cement was applied to the cut surface of the proximal tibia as well as along the undersurface of a size 2 MBT tibial component. The tibial component was positioned and impacted into place. Excess cement was removed using freer elevators. Cement was applied to the cut surface of the femur as well as along the posterior flanges of a size 2.5 posterior stabilized femoral component. Femoral component was positioned and impacted into place. Excess cement was removed using freer elevators. A 10 mm polyethylene trial was inserted and the knee was brought in full extension with steady axial compression applied. Finally, cement was applied to the backside of a 35 mm three peg oval dome patella and the patellar component was positioned and patellar clamp applied. Excess cement was removed using freer elevators.   After adequate curing of the cement, the tourniquet was deflated after a total tourniquet time of 78 minutes. Hemostasis was achieved using electrocautery. The knee was irrigated with copious amounts of normal saline with antibiotic solution using pulsatile lavage and then suctioned dry. The knee was inspected for any residual  cement debris. 30 mL of 0.25% Marcaine with epinephrine was injected along the posterior capsule. A 10 mm stabilized rotating platform polyethylene insert was inserted and the knee was placed through a range of motion. Excellent mediolateral soft tissue balancing was appreciated and excellent patellar tracking appreciated. Two medium drains were placed in the wound bed and brought out through a separate stab incision to be attached to a reinfusion system. The medial parapatellar portion of the incision was reapproximated using #1 Vicryl. The subcutaneous tissue was approximated in layers using first #0 Vicryl followed by 2-0 Vicryl. Skin was closed with skin staples. A sterile dressing was applied. The patient tolerated the procedure well. She was transported to the recovery room in stable condition.   ____________________________ Laurice Record. Holley Bouche., MD jph:ap D: 03/31/2012 11:34:12 ET T: 04/01/2012 10:30:44 ET JOB#: [id number]  cc: Jeneen Rinks P. Holley Bouche., MD, <Dictator> JAMES P Holley Bouche MD ELECTRONICALLY SIGNED 04/03/2012 17:11

## 2015-01-21 ENCOUNTER — Encounter: Payer: Self-pay | Admitting: Internal Medicine

## 2015-01-22 ENCOUNTER — Encounter: Payer: Self-pay | Admitting: Internal Medicine

## 2015-01-22 ENCOUNTER — Ambulatory Visit (INDEPENDENT_AMBULATORY_CARE_PROVIDER_SITE_OTHER): Payer: Medicare Other | Admitting: Internal Medicine

## 2015-01-22 VITALS — BP 130/82 | HR 88 | Temp 98.1°F | Resp 14 | Ht 65.0 in | Wt 172.5 lb

## 2015-01-22 DIAGNOSIS — Z13820 Encounter for screening for osteoporosis: Secondary | ICD-10-CM | POA: Diagnosis not present

## 2015-01-22 DIAGNOSIS — M5432 Sciatica, left side: Secondary | ICD-10-CM

## 2015-01-22 DIAGNOSIS — E559 Vitamin D deficiency, unspecified: Secondary | ICD-10-CM | POA: Diagnosis not present

## 2015-01-22 DIAGNOSIS — M545 Low back pain, unspecified: Secondary | ICD-10-CM

## 2015-01-22 DIAGNOSIS — E119 Type 2 diabetes mellitus without complications: Secondary | ICD-10-CM

## 2015-01-22 DIAGNOSIS — E785 Hyperlipidemia, unspecified: Secondary | ICD-10-CM

## 2015-01-22 MED ORDER — LEVOTHYROXINE SODIUM 88 MCG PO TABS: ORAL_TABLET | ORAL | Status: DC

## 2015-01-22 MED ORDER — DICLOFENAC SODIUM 75 MG PO TBEC: 75.0000 mg | DELAYED_RELEASE_TABLET | Freq: Two times a day (BID) | ORAL | Status: DC

## 2015-01-22 NOTE — Patient Instructions (Addendum)
I am ordering a referral to Dr Sharlet Salina at [facility]   I Parker TO DICLOFENAC AS YOUR TWICE DAILY ANTI INFLAMMATORY   Your diabetes remains under excellent control  And your cholesterol and other labs are also normal. Please continue your current medications. return in 6 months for follow up on diabetes and make sure you are seeing your eye doctor at least once a year.

## 2015-01-22 NOTE — Progress Notes (Signed)
Pre-visit discussion using our clinic review tool. No additional management support is needed unless otherwise documented below in the visit note.  
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
## 2015-01-23 ENCOUNTER — Encounter: Payer: Self-pay | Admitting: Internal Medicine

## 2015-01-23 DIAGNOSIS — M67912 Unspecified disorder of synovium and tendon, left shoulder: Secondary | ICD-10-CM | POA: Diagnosis not present

## 2015-01-23 DIAGNOSIS — M67911 Unspecified disorder of synovium and tendon, right shoulder: Secondary | ICD-10-CM | POA: Diagnosis not present

## 2015-01-23 NOTE — Assessment & Plan Note (Signed)
Historically well-controlled on current medications.  hemoglobin A1c has been consistently at or  less than 7.0 . Patient is up-to-date on eye exams and foot exam is normal today. Patient is due for urine microalbumin to creatinine ratio at next visit. Patient is tolerating statin therapy for CAD risk reduction and on ACE/ARB for reduction in proteinuria.  Current labs are pending

## 2015-01-23 NOTE — Assessment & Plan Note (Addendum)
Current 10 yr risk of developing CAD based on Framingham criteria is 15% .New ACC guidelines recommend starting patients aged 73 or higher on moderate intensity statin therapy for diabetes and concurrent LDL between 70-189. However, she is statin intolerant  Lab Results  Component Value Date   CHOL 226* 01/17/2015   HDL 75.40 01/17/2015   LDLCALC 140* 01/17/2015   LDLDIRECT 168.8 08/16/2013   TRIG 55.0 01/17/2015   CHOLHDL 3 01/17/2015

## 2015-01-23 NOTE — Assessment & Plan Note (Addendum)
MRI showed bulging disks and osteophytes at multiple levels without spinal or foraminal stenosis. without sciatica,  Intermittent with history suggestive of muscle spasm.  Advised to lose weight and incorporate core strengthening exercises into exercise regimen, referral to Dr. Karn Cassis

## 2015-01-25 DIAGNOSIS — M67912 Unspecified disorder of synovium and tendon, left shoulder: Secondary | ICD-10-CM | POA: Diagnosis not present

## 2015-01-25 DIAGNOSIS — M67911 Unspecified disorder of synovium and tendon, right shoulder: Secondary | ICD-10-CM | POA: Diagnosis not present

## 2015-01-28 DIAGNOSIS — M67912 Unspecified disorder of synovium and tendon, left shoulder: Secondary | ICD-10-CM | POA: Diagnosis not present

## 2015-01-28 DIAGNOSIS — M9903 Segmental and somatic dysfunction of lumbar region: Secondary | ICD-10-CM | POA: Diagnosis not present

## 2015-01-28 DIAGNOSIS — M67911 Unspecified disorder of synovium and tendon, right shoulder: Secondary | ICD-10-CM | POA: Diagnosis not present

## 2015-01-28 DIAGNOSIS — M5416 Radiculopathy, lumbar region: Secondary | ICD-10-CM | POA: Diagnosis not present

## 2015-01-28 DIAGNOSIS — M5136 Other intervertebral disc degeneration, lumbar region: Secondary | ICD-10-CM | POA: Diagnosis not present

## 2015-01-28 DIAGNOSIS — M9905 Segmental and somatic dysfunction of pelvic region: Secondary | ICD-10-CM | POA: Diagnosis not present

## 2015-01-29 ENCOUNTER — Encounter: Payer: Self-pay | Admitting: Family Medicine

## 2015-01-29 ENCOUNTER — Ambulatory Visit (INDEPENDENT_AMBULATORY_CARE_PROVIDER_SITE_OTHER): Payer: Medicare Other | Admitting: Family Medicine

## 2015-01-29 VITALS — BP 106/72 | HR 82 | Ht 65.0 in | Wt 176.0 lb

## 2015-01-29 DIAGNOSIS — M5416 Radiculopathy, lumbar region: Secondary | ICD-10-CM | POA: Diagnosis not present

## 2015-01-29 DIAGNOSIS — M5136 Other intervertebral disc degeneration, lumbar region: Secondary | ICD-10-CM | POA: Diagnosis not present

## 2015-01-29 DIAGNOSIS — M9903 Segmental and somatic dysfunction of lumbar region: Secondary | ICD-10-CM | POA: Diagnosis not present

## 2015-01-29 DIAGNOSIS — M67912 Unspecified disorder of synovium and tendon, left shoulder: Principal | ICD-10-CM

## 2015-01-29 DIAGNOSIS — M9905 Segmental and somatic dysfunction of pelvic region: Secondary | ICD-10-CM | POA: Diagnosis not present

## 2015-01-29 DIAGNOSIS — M75102 Unspecified rotator cuff tear or rupture of left shoulder, not specified as traumatic: Secondary | ICD-10-CM

## 2015-01-29 DIAGNOSIS — M75101 Unspecified rotator cuff tear or rupture of right shoulder, not specified as traumatic: Secondary | ICD-10-CM

## 2015-01-29 DIAGNOSIS — M67911 Unspecified disorder of synovium and tendon, right shoulder: Secondary | ICD-10-CM

## 2015-01-29 NOTE — Progress Notes (Signed)
  [facility] [address] [place], [place] [address] Phone: [phone] Subjective:    CC: shoulder pain follow up  Jacqueline Mata is a 74 y.o. female coming in with complaint of shoulder pain bilaterally. Patient was given injections at last exam and has started formal physical therapy. Patient states that most of the pain she was having previously is significantly better. Patient states that her left shoulder is a most pain-free. Patient is continuing to have some mild discomfort on the right shoulder. Patient states certain activities seem to make it aggravated but denies any weakness or any radiation of the pain down the arm. Patient states she has been doing the exercises religiously. Has noticed some mild increase in range of motion which is very helpful. Patient is also sleeping comfortably at night. Would state that she is approximately 60-70% better.     Past medical history, social, surgical and family history all reviewed in electronic medical record.   Review of Systems: No headache, visual changes, nausea, vomiting, diarrhea, constipation, dizziness, abdominal pain, skin rash, fevers, chills, night sweats, weight loss, swollen lymph nodes, body aches, joint swelling, muscle aches, chest pain, shortness of breath, mood changes.   Objective Blood pressure 106/72, pulse 82, height 5\' 5"  (1.651 m), weight 176 lb (79.833 kg), SpO2 96 %.  General: No apparent distress alert and oriented x3 mood and affect normal, dressed appropriately.  HEENT: Pupils equal, extraocular movements intact  Respiratory: Patient's speak in full sentences and does not appear short of breath  Cardiovascular: No lower extremity edema, non tender, no erythema  Skin: Warm dry intact with no signs of infection or rash on extremities or on axial skeleton.  Abdomen: Soft nontender  Neuro: Cranial nerves II through XII are intact, neurovascularly intact in all  extremities with 2+ DTRs and 2+ pulses.  Lymph: No lymphadenopathy of posterior or anterior cervical chain or axillae bilaterally.  Gait normal with good balance and coordination.  MSK:  Non tender with full range of motion and good stability and symmetric strength and tone of  elbows, wrist, hip, knee and ankles bilaterally.  Shoulder:right Inspection reveals no abnormalities, atrophy or asymmetry. Palpation is normal with no tenderness over AC joint or bicipital groove. ROM is full in all planes passively. Rotator cuff strength normal throughout. signs of impingement with positive Neer and Hawkin's tests, but negative empty can sign. Speeds and Yergason's tests normal. No labral pathology noted with negative Obrien's, negative clunk and good stability. Normal scapular function observed. No painful arc and no drop arm sign. No apprehension sign Contralateral shoulder unremarkable   Impression and Recommendations:     This case required medical decision making of moderate complexity.

## 2015-01-29 NOTE — Patient Instructions (Signed)
Good to see you Try medicine 3 times a day for 3 days and stop diclofenac for now.  The steroid I used was kenalog.  Ice is still your friend Continue with physical therapy See me again in 3 weeks and if right shoulder still hurts we will do another injeciton.

## 2015-01-29 NOTE — Assessment & Plan Note (Signed)
Patient's left shoulder is almost fully healed the patient continues to have some mild discomfort on the right shoulder. We did do a decreased dose of patient steroid with the injections and we may went to consider repeat in the next 3 weeks. Patient will continue with the conservative therapy and discussed which activities potentially avoid and continuing to do the icing protocol and a regular basis. Patient will continue to work very hard and diligently do the home exercises regularly and will come back and see me again in 3 weeks. If continuing have pain we will consider a repeat injection.  Spent  25 minutes with patient face-to-face and had greater than 50% of counseling including as described above in assessment and plan.

## 2015-01-29 NOTE — Progress Notes (Signed)
Pre visit review using our clinic review tool, if applicable. No additional management support is needed unless otherwise documented below in the visit note. 

## 2015-01-30 DIAGNOSIS — M67911 Unspecified disorder of synovium and tendon, right shoulder: Secondary | ICD-10-CM | POA: Diagnosis not present

## 2015-01-30 DIAGNOSIS — M9905 Segmental and somatic dysfunction of pelvic region: Secondary | ICD-10-CM | POA: Diagnosis not present

## 2015-01-30 DIAGNOSIS — M67912 Unspecified disorder of synovium and tendon, left shoulder: Secondary | ICD-10-CM | POA: Diagnosis not present

## 2015-01-30 DIAGNOSIS — M5416 Radiculopathy, lumbar region: Secondary | ICD-10-CM | POA: Diagnosis not present

## 2015-01-30 DIAGNOSIS — M9903 Segmental and somatic dysfunction of lumbar region: Secondary | ICD-10-CM | POA: Diagnosis not present

## 2015-01-30 DIAGNOSIS — M5136 Other intervertebral disc degeneration, lumbar region: Secondary | ICD-10-CM | POA: Diagnosis not present

## 2015-02-01 DIAGNOSIS — M67911 Unspecified disorder of synovium and tendon, right shoulder: Secondary | ICD-10-CM | POA: Diagnosis not present

## 2015-02-01 DIAGNOSIS — M67912 Unspecified disorder of synovium and tendon, left shoulder: Secondary | ICD-10-CM | POA: Diagnosis not present

## 2015-02-01 DIAGNOSIS — M5416 Radiculopathy, lumbar region: Secondary | ICD-10-CM | POA: Diagnosis not present

## 2015-02-01 DIAGNOSIS — M9905 Segmental and somatic dysfunction of pelvic region: Secondary | ICD-10-CM | POA: Diagnosis not present

## 2015-02-01 DIAGNOSIS — M5136 Other intervertebral disc degeneration, lumbar region: Secondary | ICD-10-CM | POA: Diagnosis not present

## 2015-02-01 DIAGNOSIS — M9903 Segmental and somatic dysfunction of lumbar region: Secondary | ICD-10-CM | POA: Diagnosis not present

## 2015-02-04 DIAGNOSIS — M67911 Unspecified disorder of synovium and tendon, right shoulder: Secondary | ICD-10-CM | POA: Diagnosis not present

## 2015-02-04 DIAGNOSIS — M67912 Unspecified disorder of synovium and tendon, left shoulder: Secondary | ICD-10-CM | POA: Diagnosis not present

## 2015-02-04 DIAGNOSIS — M5416 Radiculopathy, lumbar region: Secondary | ICD-10-CM | POA: Diagnosis not present

## 2015-02-04 DIAGNOSIS — M9905 Segmental and somatic dysfunction of pelvic region: Secondary | ICD-10-CM | POA: Diagnosis not present

## 2015-02-04 DIAGNOSIS — M5136 Other intervertebral disc degeneration, lumbar region: Secondary | ICD-10-CM | POA: Diagnosis not present

## 2015-02-04 DIAGNOSIS — M9903 Segmental and somatic dysfunction of lumbar region: Secondary | ICD-10-CM | POA: Diagnosis not present

## 2015-02-06 DIAGNOSIS — M5136 Other intervertebral disc degeneration, lumbar region: Secondary | ICD-10-CM | POA: Diagnosis not present

## 2015-02-06 DIAGNOSIS — M9905 Segmental and somatic dysfunction of pelvic region: Secondary | ICD-10-CM | POA: Diagnosis not present

## 2015-02-06 DIAGNOSIS — M5416 Radiculopathy, lumbar region: Secondary | ICD-10-CM | POA: Diagnosis not present

## 2015-02-06 DIAGNOSIS — M67911 Unspecified disorder of synovium and tendon, right shoulder: Secondary | ICD-10-CM | POA: Diagnosis not present

## 2015-02-06 DIAGNOSIS — M9903 Segmental and somatic dysfunction of lumbar region: Secondary | ICD-10-CM | POA: Diagnosis not present

## 2015-02-06 DIAGNOSIS — M67912 Unspecified disorder of synovium and tendon, left shoulder: Secondary | ICD-10-CM | POA: Diagnosis not present

## 2015-02-08 DIAGNOSIS — M67912 Unspecified disorder of synovium and tendon, left shoulder: Secondary | ICD-10-CM | POA: Diagnosis not present

## 2015-02-08 DIAGNOSIS — M9905 Segmental and somatic dysfunction of pelvic region: Secondary | ICD-10-CM | POA: Diagnosis not present

## 2015-02-08 DIAGNOSIS — M5136 Other intervertebral disc degeneration, lumbar region: Secondary | ICD-10-CM | POA: Diagnosis not present

## 2015-02-08 DIAGNOSIS — M5416 Radiculopathy, lumbar region: Secondary | ICD-10-CM | POA: Diagnosis not present

## 2015-02-08 DIAGNOSIS — M67911 Unspecified disorder of synovium and tendon, right shoulder: Secondary | ICD-10-CM | POA: Diagnosis not present

## 2015-02-08 DIAGNOSIS — M9903 Segmental and somatic dysfunction of lumbar region: Secondary | ICD-10-CM | POA: Diagnosis not present

## 2015-02-11 DIAGNOSIS — M9905 Segmental and somatic dysfunction of pelvic region: Secondary | ICD-10-CM | POA: Diagnosis not present

## 2015-02-11 DIAGNOSIS — M5416 Radiculopathy, lumbar region: Secondary | ICD-10-CM | POA: Diagnosis not present

## 2015-02-11 DIAGNOSIS — M5136 Other intervertebral disc degeneration, lumbar region: Secondary | ICD-10-CM | POA: Diagnosis not present

## 2015-02-11 DIAGNOSIS — M9903 Segmental and somatic dysfunction of lumbar region: Secondary | ICD-10-CM | POA: Diagnosis not present

## 2015-02-11 DIAGNOSIS — M67912 Unspecified disorder of synovium and tendon, left shoulder: Secondary | ICD-10-CM | POA: Diagnosis not present

## 2015-02-11 DIAGNOSIS — M67911 Unspecified disorder of synovium and tendon, right shoulder: Secondary | ICD-10-CM | POA: Diagnosis not present

## 2015-02-13 DIAGNOSIS — M67911 Unspecified disorder of synovium and tendon, right shoulder: Secondary | ICD-10-CM | POA: Diagnosis not present

## 2015-02-13 DIAGNOSIS — M5416 Radiculopathy, lumbar region: Secondary | ICD-10-CM | POA: Diagnosis not present

## 2015-02-13 DIAGNOSIS — M9905 Segmental and somatic dysfunction of pelvic region: Secondary | ICD-10-CM | POA: Diagnosis not present

## 2015-02-13 DIAGNOSIS — M9903 Segmental and somatic dysfunction of lumbar region: Secondary | ICD-10-CM | POA: Diagnosis not present

## 2015-02-13 DIAGNOSIS — M5136 Other intervertebral disc degeneration, lumbar region: Secondary | ICD-10-CM | POA: Diagnosis not present

## 2015-02-13 DIAGNOSIS — M67912 Unspecified disorder of synovium and tendon, left shoulder: Secondary | ICD-10-CM | POA: Diagnosis not present

## 2015-02-14 ENCOUNTER — Encounter: Payer: Self-pay | Admitting: Internal Medicine

## 2015-02-14 DIAGNOSIS — M9905 Segmental and somatic dysfunction of pelvic region: Secondary | ICD-10-CM | POA: Diagnosis not present

## 2015-02-14 DIAGNOSIS — M9903 Segmental and somatic dysfunction of lumbar region: Secondary | ICD-10-CM | POA: Diagnosis not present

## 2015-02-14 DIAGNOSIS — M5136 Other intervertebral disc degeneration, lumbar region: Secondary | ICD-10-CM | POA: Diagnosis not present

## 2015-02-14 DIAGNOSIS — M5416 Radiculopathy, lumbar region: Secondary | ICD-10-CM | POA: Diagnosis not present

## 2015-02-15 DIAGNOSIS — M5442 Lumbago with sciatica, left side: Secondary | ICD-10-CM | POA: Diagnosis not present

## 2015-02-18 DIAGNOSIS — M5136 Other intervertebral disc degeneration, lumbar region: Secondary | ICD-10-CM | POA: Diagnosis not present

## 2015-02-18 DIAGNOSIS — M5416 Radiculopathy, lumbar region: Secondary | ICD-10-CM | POA: Diagnosis not present

## 2015-02-18 DIAGNOSIS — M9903 Segmental and somatic dysfunction of lumbar region: Secondary | ICD-10-CM | POA: Diagnosis not present

## 2015-02-18 DIAGNOSIS — M5442 Lumbago with sciatica, left side: Secondary | ICD-10-CM | POA: Diagnosis not present

## 2015-02-18 DIAGNOSIS — M9905 Segmental and somatic dysfunction of pelvic region: Secondary | ICD-10-CM | POA: Diagnosis not present

## 2015-02-19 ENCOUNTER — Other Ambulatory Visit (INDEPENDENT_AMBULATORY_CARE_PROVIDER_SITE_OTHER): Payer: Medicare Other

## 2015-02-19 ENCOUNTER — Ambulatory Visit (INDEPENDENT_AMBULATORY_CARE_PROVIDER_SITE_OTHER)
Admission: RE | Admit: 2015-02-19 | Discharge: 2015-02-19 | Disposition: A | Discharge status: Home / Self Care | Payer: Medicare Other | Source: Ambulatory Visit | Attending: Family Medicine | Admitting: Family Medicine

## 2015-02-19 ENCOUNTER — Encounter: Payer: Self-pay | Admitting: Family Medicine

## 2015-02-19 ENCOUNTER — Ambulatory Visit (INDEPENDENT_AMBULATORY_CARE_PROVIDER_SITE_OTHER): Payer: Medicare Other | Admitting: Family Medicine

## 2015-02-19 VITALS — BP 116/76 | HR 91 | Wt 177.0 lb

## 2015-02-19 DIAGNOSIS — M25512 Pain in left shoulder: Secondary | ICD-10-CM

## 2015-02-19 DIAGNOSIS — M129 Arthropathy, unspecified: Secondary | ICD-10-CM | POA: Diagnosis not present

## 2015-02-19 DIAGNOSIS — M25511 Pain in right shoulder: Secondary | ICD-10-CM | POA: Diagnosis not present

## 2015-02-19 DIAGNOSIS — M47812 Spondylosis without myelopathy or radiculopathy, cervical region: Secondary | ICD-10-CM | POA: Diagnosis not present

## 2015-02-19 DIAGNOSIS — M19019 Primary osteoarthritis, unspecified shoulder: Secondary | ICD-10-CM

## 2015-02-19 DIAGNOSIS — M19011 Primary osteoarthritis, right shoulder: Secondary | ICD-10-CM | POA: Diagnosis not present

## 2015-02-19 DIAGNOSIS — M4312 Spondylolisthesis, cervical region: Secondary | ICD-10-CM | POA: Diagnosis not present

## 2015-02-19 DIAGNOSIS — M75101 Unspecified rotator cuff tear or rupture of right shoulder, not specified as traumatic: Secondary | ICD-10-CM

## 2015-02-19 DIAGNOSIS — M67911 Unspecified disorder of synovium and tendon, right shoulder: Secondary | ICD-10-CM

## 2015-02-19 NOTE — Assessment & Plan Note (Addendum)
Patient's left shoulder has completely resolved at this time. Discussed icing regimen, home exercises and topical anti-inflammatory.patient will continue with formal physical therapy. Patient and will come back and see me again in 3-4 weeks. If continuing to have discomfort we may need to consider further imaging.trace ordered today of right shoulder and neck to rule out cervical radiculopathy.

## 2015-02-19 NOTE — Progress Notes (Signed)
[facility] [address], [place] [address] Phone: [phone] Subjective:    CC: shoulder pain follow up  [id number] Jacqueline Mata is a 74 y.o. female coming in with complaint of shoulder pain bilaterally. Patient was given injections at last exam and has started formal physical therapy. She was doing 60-70% better after last exam. Patient also was only having some very mild pain of the right shoulder. Patient continue with the conservative therapy at home as well as icing protocol. Patient statesthe right shoulder seems to be worsening slowly. Patient continues to try to be active but even with some of her daily activities of pain seems to be worse. Patient denies any significant weakness but states that the dull throbbing aching pain is stopping her from some of her daily activities. States that sometimes it's very difficult becoming comfortable at night.     Past medical history, social, surgical and family history all reviewed in electronic medical record.   Review of Systems: No headache, visual changes, nausea, vomiting, diarrhea, constipation, dizziness, abdominal pain, skin rash, fevers, chills, night sweats, weight loss, swollen lymph nodes, body aches, joint swelling, muscle aches, chest pain, shortness of breath, mood changes.   Objective Blood pressure 116/76, pulse 91, weight 177 lb (80.287 kg), SpO2 97 %.  General: No apparent distress alert and oriented x3 mood and affect normal, dressed appropriately.  HEENT: Pupils equal, extraocular movements intact  Respiratory: Patient's speak in full sentences and does not appear short of breath  Cardiovascular: No lower extremity edema, non tender, no erythema  Skin: Warm dry intact with no signs of infection or rash on extremities or on axial skeleton.  Abdomen: Soft nontender  Neuro: Cranial nerves II through XII are intact, neurovascularly intact in all extremities with 2+ DTRs and 2+  pulses.  Lymph: No lymphadenopathy of posterior or anterior cervical chain or axillae bilaterally.  Gait normal with good balance and coordination.  MSK:  Non tender with full range of motion and good stability and symmetric strength and tone of  elbows, wrist, hip, knee and ankles bilaterally.  Neck: Inspection reveals mild increasing kyphosis. No palpable stepoffs. Negative Spurling's maneuver. Full neck range of motion Grip strength and sensation normal in bilateral hands Strength good C4 to T1 distribution No sensory change to C4 to T1 Negative Hoffman sign bilaterally Reflexes normal  Shoulder:right Inspection reveals no abnormalities, atrophy or asymmetry. ROM is full in all planes passively. Rotator cuff strength normal throughout. signs of impingement with positive Neer and Hawkin's tests, but negative empty can sign.continued and may be worse than previous exam. Speeds and Yergason's tests normal. No labral pathology noted with negative Obrien's, negative clunk and good stability. Normal scapular function observed. No painful arc and no drop arm sign. No apprehension sign Positive crossover test with tenderness over the acromioclavicular joint Contralateral shoulder unremarkable  Procedure: Real-time Ultrasound Guided Injection of right glenohumeral joint Device: GE Logiq E  Ultrasound guided injection is preferred based studies that show increased duration, increased effect, greater accuracy, decreased procedural pain, increased response rate with ultrasound guided versus blind injection.  Verbal informed consent obtained.  Time-out conducted.  Noted no overlying erythema, induration, or other signs of local infection.  Skin prepped in a sterile fashion.  Local anesthesia: Topical Ethyl chloride.  With sterile technique and under real time ultrasound guidance:  Joint visualized.  23g 1  inch needle inserted posterior approach. Pictures taken for needle placement.  Patient did have  injection of 2 cc of 1% lidocaine, 2 cc of 0.5% Marcaine, and 1.0 cc of Kenalog 40 mg/dL. Completed without difficulty  Pain immediately resolved suggesting accurate placement of the medication.  Advised to call if fevers/chills, erythema, induration, drainage, or persistent bleeding.  Images permanently stored and available for review in the ultrasound unit.  Impression: Technically successful ultrasound guided injection.   Procedure: Real-time Ultrasound Guided Injection of right acromioclavicular joint Device: GE Logiq E  Ultrasound guided injection is preferred based studies that show increased duration, increased effect, greater accuracy, decreased procedural pain, increased response rate, and decreased cost with ultrasound guided versus blind injection.  Verbal informed consent obtained.  Time-out conducted.  Noted no overlying erythema, induration, or other signs of local infection.  Skin prepped in a sterile fashion.  Local anesthesia: Topical Ethyl chloride.  With sterile technique and under real time ultrasound guidance:  With a 25-gauge 1 inch needle patient was injected into the acromial collicular joint with 0.5 mL of 0.5% Marcaine and 0.5 mL of Kenalog 40 mg/dL. Completed without difficulty  Pain immediately resolved suggesting accurate placement of the medication.  Advised to call if fevers/chills, erythema, induration, drainage, or persistent bleeding.  Images permanently stored and available for review in the ultrasound unit.  Impression: Technically successful ultrasound guided injection.   Impression and Recommendations:     This case required medical decision making of moderate complexity.

## 2015-02-19 NOTE — Progress Notes (Signed)
Pre visit review using our clinic review tool, if applicable. No additional management support is needed unless otherwise documented below in the visit note. 

## 2015-02-19 NOTE — Patient Instructions (Addendum)
Good to see you Lets get an xray of you neck and shoulder to see if this is contributing.  Ice is still your friend.  Continue the physical therapy  2 injections today to see if it helps See me again in 3-4 weeks to make sure you are better.

## 2015-02-19 NOTE — Assessment & Plan Note (Signed)
Patient was given an injection today, icing protocol, home exercises and patient will continue topical anti-inflammatory sent oral anti-inflammatory's when needed as well as formal physical therapy.

## 2015-02-20 DIAGNOSIS — M5136 Other intervertebral disc degeneration, lumbar region: Secondary | ICD-10-CM | POA: Diagnosis not present

## 2015-02-20 DIAGNOSIS — M5416 Radiculopathy, lumbar region: Secondary | ICD-10-CM | POA: Diagnosis not present

## 2015-02-20 DIAGNOSIS — M9905 Segmental and somatic dysfunction of pelvic region: Secondary | ICD-10-CM | POA: Diagnosis not present

## 2015-02-20 DIAGNOSIS — M9903 Segmental and somatic dysfunction of lumbar region: Secondary | ICD-10-CM | POA: Diagnosis not present

## 2015-02-21 DIAGNOSIS — Z1231 Encounter for screening mammogram for malignant neoplasm of breast: Secondary | ICD-10-CM | POA: Diagnosis not present

## 2015-02-21 DIAGNOSIS — M9903 Segmental and somatic dysfunction of lumbar region: Secondary | ICD-10-CM | POA: Diagnosis not present

## 2015-02-21 DIAGNOSIS — M5136 Other intervertebral disc degeneration, lumbar region: Secondary | ICD-10-CM | POA: Diagnosis not present

## 2015-02-21 DIAGNOSIS — M5416 Radiculopathy, lumbar region: Secondary | ICD-10-CM | POA: Diagnosis not present

## 2015-02-21 DIAGNOSIS — M5442 Lumbago with sciatica, left side: Secondary | ICD-10-CM | POA: Diagnosis not present

## 2015-02-21 DIAGNOSIS — M9905 Segmental and somatic dysfunction of pelvic region: Secondary | ICD-10-CM | POA: Diagnosis not present

## 2015-02-21 DIAGNOSIS — Z853 Personal history of malignant neoplasm of breast: Secondary | ICD-10-CM | POA: Diagnosis not present

## 2015-02-21 LAB — HM MAMMOGRAPHY

## 2015-02-22 ENCOUNTER — Encounter: Payer: Self-pay | Admitting: *Deleted

## 2015-02-26 DIAGNOSIS — M5442 Lumbago with sciatica, left side: Secondary | ICD-10-CM | POA: Diagnosis not present

## 2015-02-26 DIAGNOSIS — M5136 Other intervertebral disc degeneration, lumbar region: Secondary | ICD-10-CM | POA: Diagnosis not present

## 2015-02-26 DIAGNOSIS — M5416 Radiculopathy, lumbar region: Secondary | ICD-10-CM | POA: Diagnosis not present

## 2015-02-26 DIAGNOSIS — M9905 Segmental and somatic dysfunction of pelvic region: Secondary | ICD-10-CM | POA: Diagnosis not present

## 2015-02-26 DIAGNOSIS — M9903 Segmental and somatic dysfunction of lumbar region: Secondary | ICD-10-CM | POA: Diagnosis not present

## 2015-02-28 DIAGNOSIS — M5416 Radiculopathy, lumbar region: Secondary | ICD-10-CM | POA: Diagnosis not present

## 2015-02-28 DIAGNOSIS — M5136 Other intervertebral disc degeneration, lumbar region: Secondary | ICD-10-CM | POA: Diagnosis not present

## 2015-02-28 DIAGNOSIS — M9905 Segmental and somatic dysfunction of pelvic region: Secondary | ICD-10-CM | POA: Diagnosis not present

## 2015-02-28 DIAGNOSIS — M9903 Segmental and somatic dysfunction of lumbar region: Secondary | ICD-10-CM | POA: Diagnosis not present

## 2015-03-01 DIAGNOSIS — M5442 Lumbago with sciatica, left side: Secondary | ICD-10-CM | POA: Diagnosis not present

## 2015-03-04 DIAGNOSIS — M5136 Other intervertebral disc degeneration, lumbar region: Secondary | ICD-10-CM | POA: Diagnosis not present

## 2015-03-04 DIAGNOSIS — M9905 Segmental and somatic dysfunction of pelvic region: Secondary | ICD-10-CM | POA: Diagnosis not present

## 2015-03-04 DIAGNOSIS — M9903 Segmental and somatic dysfunction of lumbar region: Secondary | ICD-10-CM | POA: Diagnosis not present

## 2015-03-04 DIAGNOSIS — M5416 Radiculopathy, lumbar region: Secondary | ICD-10-CM | POA: Diagnosis not present

## 2015-03-05 DIAGNOSIS — M9903 Segmental and somatic dysfunction of lumbar region: Secondary | ICD-10-CM | POA: Diagnosis not present

## 2015-03-05 DIAGNOSIS — M5416 Radiculopathy, lumbar region: Secondary | ICD-10-CM | POA: Diagnosis not present

## 2015-03-05 DIAGNOSIS — M9905 Segmental and somatic dysfunction of pelvic region: Secondary | ICD-10-CM | POA: Diagnosis not present

## 2015-03-05 DIAGNOSIS — M5136 Other intervertebral disc degeneration, lumbar region: Secondary | ICD-10-CM | POA: Diagnosis not present

## 2015-03-06 DIAGNOSIS — M5416 Radiculopathy, lumbar region: Secondary | ICD-10-CM | POA: Diagnosis not present

## 2015-03-06 DIAGNOSIS — M5136 Other intervertebral disc degeneration, lumbar region: Secondary | ICD-10-CM | POA: Diagnosis not present

## 2015-03-06 DIAGNOSIS — M9905 Segmental and somatic dysfunction of pelvic region: Secondary | ICD-10-CM | POA: Diagnosis not present

## 2015-03-06 DIAGNOSIS — M9903 Segmental and somatic dysfunction of lumbar region: Secondary | ICD-10-CM | POA: Diagnosis not present

## 2015-03-07 DIAGNOSIS — M9903 Segmental and somatic dysfunction of lumbar region: Secondary | ICD-10-CM | POA: Diagnosis not present

## 2015-03-07 DIAGNOSIS — M5136 Other intervertebral disc degeneration, lumbar region: Secondary | ICD-10-CM | POA: Diagnosis not present

## 2015-03-07 DIAGNOSIS — M9905 Segmental and somatic dysfunction of pelvic region: Secondary | ICD-10-CM | POA: Diagnosis not present

## 2015-03-07 DIAGNOSIS — M5416 Radiculopathy, lumbar region: Secondary | ICD-10-CM | POA: Diagnosis not present

## 2015-03-11 DIAGNOSIS — M5416 Radiculopathy, lumbar region: Secondary | ICD-10-CM | POA: Diagnosis not present

## 2015-03-11 DIAGNOSIS — M9905 Segmental and somatic dysfunction of pelvic region: Secondary | ICD-10-CM | POA: Diagnosis not present

## 2015-03-11 DIAGNOSIS — M9903 Segmental and somatic dysfunction of lumbar region: Secondary | ICD-10-CM | POA: Diagnosis not present

## 2015-03-11 DIAGNOSIS — M5136 Other intervertebral disc degeneration, lumbar region: Secondary | ICD-10-CM | POA: Diagnosis not present

## 2015-03-13 DIAGNOSIS — M5442 Lumbago with sciatica, left side: Secondary | ICD-10-CM | POA: Diagnosis not present

## 2015-03-14 DIAGNOSIS — M5136 Other intervertebral disc degeneration, lumbar region: Secondary | ICD-10-CM | POA: Diagnosis not present

## 2015-03-14 DIAGNOSIS — M9903 Segmental and somatic dysfunction of lumbar region: Secondary | ICD-10-CM | POA: Diagnosis not present

## 2015-03-14 DIAGNOSIS — M5416 Radiculopathy, lumbar region: Secondary | ICD-10-CM | POA: Diagnosis not present

## 2015-03-14 DIAGNOSIS — M9905 Segmental and somatic dysfunction of pelvic region: Secondary | ICD-10-CM | POA: Diagnosis not present

## 2015-03-15 DIAGNOSIS — M5442 Lumbago with sciatica, left side: Secondary | ICD-10-CM | POA: Diagnosis not present

## 2015-03-19 ENCOUNTER — Other Ambulatory Visit: Payer: Self-pay | Admitting: *Deleted

## 2015-03-19 ENCOUNTER — Encounter: Payer: Self-pay | Admitting: Family Medicine

## 2015-03-19 ENCOUNTER — Ambulatory Visit (INDEPENDENT_AMBULATORY_CARE_PROVIDER_SITE_OTHER): Payer: Medicare Other | Admitting: Family Medicine

## 2015-03-19 VITALS — BP 112/80 | HR 94 | Ht 65.0 in | Wt 176.0 lb

## 2015-03-19 DIAGNOSIS — M47812 Spondylosis without myelopathy or radiculopathy, cervical region: Secondary | ICD-10-CM

## 2015-03-19 DIAGNOSIS — M4722 Other spondylosis with radiculopathy, cervical region: Secondary | ICD-10-CM | POA: Insufficient documentation

## 2015-03-19 MED ORDER — CELECOXIB 200 MG PO CAPS: ORAL_CAPSULE | ORAL | Status: DC

## 2015-03-19 MED ORDER — GABAPENTIN 100 MG PO CAPS: 100.0000 mg | ORAL_CAPSULE | Freq: Every day | ORAL | Status: DC

## 2015-03-19 NOTE — Patient Instructions (Signed)
Good to see you Lets treat this more as neck Gabapentin 100mg  at night Celebrex 1-2 times daily  On wall heels, butt shoulder and head touching wall for 5 minutes daily Check in with me in a week online Or see me again in 3 weeks.

## 2015-03-19 NOTE — Progress Notes (Signed)
Pre visit review using our clinic review tool, if applicable. No additional management support is needed unless otherwise documented below in the visit note. 

## 2015-03-19 NOTE — Assessment & Plan Note (Signed)
Isn't does have more of a radicular neuropathy I think at this time. Patient's shoulder seems to be fairly normal with better range of motion and decreasing impingement signs from previous exam. Patient does have a history of lumbar degenerative disc disease given her radicular symptoms as well. We are did try gabapentin as well as Celebrex. We discussed the possibility of another round of formal physical therapy which patient declined. Patient is going to do some postural changes that I think a be helpful. Patient will look into acupuncture as well as chiropractor care. Patient will come back and see me again in 3 weeks for further evaluation and treatment.  Spent  25 minutes with patient face-to-face and had greater than 50% of counseling including as described above in assessment and plan.

## 2015-03-19 NOTE — Telephone Encounter (Signed)
Pt came back into office requesting the celebrex rx be printed off so she could take it to a different pharmacy.

## 2015-03-19 NOTE — Progress Notes (Signed)
  [facility] [address] [place], [place] [address] Phone: [phone] Subjective:    CC: shoulder pain follow up  [id number] Jacqueline Mata is a 74 y.o. female coming in with complaint of shoulder pain bilaterally. Patient was given injections at last exam and has started formal physical therapy. Patient was given an injection in the shoulder as well as the acromioclavicular joint at last exam. Patient states that most of the pain is improved but continues to have a dull aching sensation with certain movements. Patient states she is looking down and continuing to do repetitive motions that seems to be worse. Patient continues to do home exercises for her back as well as for the shoulder. Patient states when she does certain when such as pulling her leg towards her chest she has more pain in the shoulder area as well. Patient states that she lays down at night and extends her head more shoulder pain as well. Patient does have x-rays showing diffuse degenerative changes of the cervical spine.     Past medical history, social, surgical and family history all reviewed in electronic medical record.   Review of Systems: No headache, visual changes, nausea, vomiting, diarrhea, constipation, dizziness, abdominal pain, skin rash, fevers, chills, night sweats, weight loss, swollen lymph nodes, body aches, joint swelling, muscle aches, chest pain, shortness of breath, mood changes.   Objective Blood pressure 112/80, pulse 94, height 5\' 5"  (1.651 m), weight 176 lb (79.833 kg), SpO2 94 %.  General: No apparent distress alert and oriented x3 mood and affect normal, dressed appropriately.  HEENT: Pupils equal, extraocular movements intact  Respiratory: Patient's speak in full sentences and does not appear short of breath  Cardiovascular: No lower extremity edema, non tender, no erythema  Skin: Warm dry intact with no signs of infection or rash on extremities or on axial  skeleton.  Abdomen: Soft nontender  Neuro: Cranial nerves II through XII are intact, neurovascularly intact in all extremities with 2+ DTRs and 2+ pulses.  Lymph: No lymphadenopathy of posterior or anterior cervical chain or axillae bilaterally.  Gait normal with good balance and coordination.  MSK:  Non tender with full range of motion and good stability and symmetric strength and tone of  elbows, wrist, hip, knee and ankles bilaterally.  Neck: Inspection reveals mild increasing kyphosis. No palpable stepoffs. Negative Spurling's maneuver. Mild decrease in rotation bilaterally as well as side bending. Grip strength and sensation normal in bilateral hands Strength good C4 to T1 distribution No sensory change to C4 to T1 Negative Hoffman sign bilaterally Reflexes normal  Shoulder:right Inspection reveals no abnormalities, atrophy or asymmetry. ROM is full in all planes passively. Rotator cuff strength normal throughout. Minimal impingement signs Speeds and Yergason's tests normal. No labral pathology noted with negative Obrien's, negative clunk and good stability. Normal scapular function observed. No painful arc and no drop arm sign. No apprehension sign Improved crossover test Contralateral shoulder unremarkable    Impression and Recommendations:     This case required medical decision making of moderate complexity.

## 2015-03-20 DIAGNOSIS — M5416 Radiculopathy, lumbar region: Secondary | ICD-10-CM | POA: Diagnosis not present

## 2015-03-20 DIAGNOSIS — M9905 Segmental and somatic dysfunction of pelvic region: Secondary | ICD-10-CM | POA: Diagnosis not present

## 2015-03-20 DIAGNOSIS — M9903 Segmental and somatic dysfunction of lumbar region: Secondary | ICD-10-CM | POA: Diagnosis not present

## 2015-03-20 DIAGNOSIS — M5136 Other intervertebral disc degeneration, lumbar region: Secondary | ICD-10-CM | POA: Diagnosis not present

## 2015-03-21 ENCOUNTER — Encounter: Payer: Self-pay | Admitting: Internal Medicine

## 2015-03-21 DIAGNOSIS — M5416 Radiculopathy, lumbar region: Secondary | ICD-10-CM | POA: Diagnosis not present

## 2015-03-21 DIAGNOSIS — M9905 Segmental and somatic dysfunction of pelvic region: Secondary | ICD-10-CM | POA: Diagnosis not present

## 2015-03-21 DIAGNOSIS — M5136 Other intervertebral disc degeneration, lumbar region: Secondary | ICD-10-CM | POA: Diagnosis not present

## 2015-03-21 DIAGNOSIS — M9903 Segmental and somatic dysfunction of lumbar region: Secondary | ICD-10-CM | POA: Diagnosis not present

## 2015-03-25 ENCOUNTER — Encounter: Payer: Self-pay | Admitting: Family Medicine

## 2015-03-25 DIAGNOSIS — M5416 Radiculopathy, lumbar region: Secondary | ICD-10-CM | POA: Diagnosis not present

## 2015-03-25 DIAGNOSIS — M9905 Segmental and somatic dysfunction of pelvic region: Secondary | ICD-10-CM | POA: Diagnosis not present

## 2015-03-25 DIAGNOSIS — M5442 Lumbago with sciatica, left side: Secondary | ICD-10-CM | POA: Diagnosis not present

## 2015-03-25 DIAGNOSIS — M5136 Other intervertebral disc degeneration, lumbar region: Secondary | ICD-10-CM | POA: Diagnosis not present

## 2015-03-25 DIAGNOSIS — M9903 Segmental and somatic dysfunction of lumbar region: Secondary | ICD-10-CM | POA: Diagnosis not present

## 2015-03-28 DIAGNOSIS — M5136 Other intervertebral disc degeneration, lumbar region: Secondary | ICD-10-CM | POA: Diagnosis not present

## 2015-03-28 DIAGNOSIS — M9905 Segmental and somatic dysfunction of pelvic region: Secondary | ICD-10-CM | POA: Diagnosis not present

## 2015-03-28 DIAGNOSIS — M5442 Lumbago with sciatica, left side: Secondary | ICD-10-CM | POA: Diagnosis not present

## 2015-03-28 DIAGNOSIS — M9903 Segmental and somatic dysfunction of lumbar region: Secondary | ICD-10-CM | POA: Diagnosis not present

## 2015-03-28 DIAGNOSIS — M5416 Radiculopathy, lumbar region: Secondary | ICD-10-CM | POA: Diagnosis not present

## 2015-04-02 DIAGNOSIS — M5442 Lumbago with sciatica, left side: Secondary | ICD-10-CM | POA: Diagnosis not present

## 2015-04-03 ENCOUNTER — Encounter: Payer: Self-pay | Admitting: Hematology & Oncology

## 2015-04-03 ENCOUNTER — Ambulatory Visit (HOSPITAL_BASED_OUTPATIENT_CLINIC_OR_DEPARTMENT_OTHER): Payer: Medicare Other | Admitting: Hematology & Oncology

## 2015-04-03 ENCOUNTER — Other Ambulatory Visit (HOSPITAL_BASED_OUTPATIENT_CLINIC_OR_DEPARTMENT_OTHER): Payer: Medicare Other

## 2015-04-03 VITALS — BP 119/76 | HR 95 | Temp 98.0°F | Resp 14 | Ht 65.0 in | Wt 177.0 lb

## 2015-04-03 DIAGNOSIS — Z853 Personal history of malignant neoplasm of breast: Secondary | ICD-10-CM

## 2015-04-03 DIAGNOSIS — E559 Vitamin D deficiency, unspecified: Secondary | ICD-10-CM | POA: Diagnosis not present

## 2015-04-03 DIAGNOSIS — M810 Age-related osteoporosis without current pathological fracture: Secondary | ICD-10-CM

## 2015-04-03 DIAGNOSIS — C50419 Malignant neoplasm of upper-outer quadrant of unspecified female breast: Secondary | ICD-10-CM | POA: Diagnosis not present

## 2015-04-03 DIAGNOSIS — D0590 Unspecified type of carcinoma in situ of unspecified breast: Secondary | ICD-10-CM

## 2015-04-03 DIAGNOSIS — D0591 Unspecified type of carcinoma in situ of right breast: Secondary | ICD-10-CM

## 2015-04-03 LAB — CBC WITH DIFFERENTIAL (CANCER CENTER ONLY)
BASO#: 0.1 10*3/uL (ref 0.0–0.2)
BASO%: 0.5 % (ref 0.0–2.0)
EOS%: 1.6 % (ref 0.0–7.0)
Eosinophils Absolute: 0.2 10*3/uL (ref 0.0–0.5)
HCT: 40.1 % (ref 34.8–46.6)
HEMOGLOBIN: 14.2 g/dL (ref 11.6–15.9)
LYMPH#: 2.7 10*3/uL (ref 0.9–3.3)
LYMPH%: 27.1 % (ref 14.0–48.0)
MCH: 32.9 pg (ref 26.0–34.0)
MCHC: 35.4 g/dL (ref 32.0–36.0)
MCV: 93 fL (ref 81–101)
MONO#: 0.7 10*3/uL (ref 0.1–0.9)
MONO%: 6.7 % (ref 0.0–13.0)
NEUT#: 6.5 10*3/uL (ref 1.5–6.5)
NEUT%: 64.1 % (ref 39.6–80.0)
Platelets: 171 10*3/uL (ref 145–400)
RBC: 4.31 10*6/uL (ref 3.70–5.32)
RDW: 13.4 % (ref 11.1–15.7)
WBC: 10.1 10*3/uL — ABNORMAL HIGH (ref 3.9–10.0)

## 2015-04-03 LAB — COMPREHENSIVE METABOLIC PANEL
ALT: 24 U/L (ref 0–35)
AST: 22 U/L (ref 0–37)
Albumin: 4.2 g/dL (ref 3.5–5.2)
Alkaline Phosphatase: 56 U/L (ref 39–117)
BUN: 17 mg/dL (ref 6–23)
CO2: 22 mEq/L (ref 19–32)
CREATININE: 0.86 mg/dL (ref 0.50–1.10)
Calcium: 9.6 mg/dL (ref 8.4–10.5)
Chloride: 104 mEq/L (ref 96–112)
Glucose, Bld: 101 mg/dL — ABNORMAL HIGH (ref 70–99)
Potassium: 4.2 mEq/L (ref 3.5–5.3)
SODIUM: 141 meq/L (ref 135–145)
TOTAL PROTEIN: 6.7 g/dL (ref 6.0–8.3)
Total Bilirubin: 0.7 mg/dL (ref 0.2–1.2)

## 2015-04-03 NOTE — Progress Notes (Signed)
Hematology and Oncology Follow Up Visit  Jacqueline Mata [id number] [birth year] 74 y.o. 04/03/2015   Principle Diagnosis:  Synchronous bilateral stage IIA (T2 N0 M0) ductal carcinoma of bilateral breast.  Current Therapy:    Observation     Interim History:  Ms.  Mata is comes in for followup. Last saw her back in January. She is doing okay. She's had no problems since we last saw her.  As always, she showing pictures of her 2 dogs. She really is dedicated to them.  She's had no problems with cough. She's had no infections. She's had no rashes. She's had no nausea or vomiting. She's had no change in bowel or bladder habits. There's been no leg swelling. She really has had no change in medications.  Overall, her performance status is ECOG 1.   Medications:  Current outpatient prescriptions:  .  aspirin 81 MG tablet, Take 81 mg by mouth daily., Disp: , Rfl:  .  azelastine (ASTELIN) 0.1 % nasal spray, Place 2 sprays into both nostrils 2 (two) times daily as needed for rhinitis. Use in each nostril as directed, Disp: 30 mL, Rfl: 5 .  Blood Glucose Monitoring Suppl KIT, by Does not apply route., Disp: , Rfl:  .  Cholecalciferol (VITAMIN D3) 1000 UNITS CAPS, Take 2 capsules by mouth daily. , Disp: , Rfl:  .  fluocinonide cream (LIDEX) 1.65 %, Apply 1 application topically daily as needed., Disp: , Rfl:  .  fluticasone (FLONASE) 50 MCG/ACT nasal spray, Place into both nostrils daily. Place 2 spriay into both nostrils daily, Disp: , Rfl:  .  folic acid (FOLVITE) 1 MG tablet, Take 1 mg by mouth daily., Disp: , Rfl:  .  Glucosamine HCl 1500 MG TABS, Take 1,500 mg by mouth daily., Disp: , Rfl:  .  glucose blood (ACCU-CHEK AVIVA PLUS) test strip, Use to check blood sugar once daily., Disp: 100 each, Rfl: 12 .  Lancets (ACCU-CHEK MULTICLIX) lancets, Use once daily to check blood sugars   Dx:E11.9, Disp: 100 each, Rfl: 3 .  levothyroxine (SYNTHROID, LEVOTHROID) 88 MCG tablet, 1 tablet daily  before breakfast, Disp: 90 tablet, Rfl: 1 .  metroNIDAZOLE (METROGEL) 1 % gel, Apply topically 2 (two) times daily. to face, Disp: , Rfl: 0 .  Omega-3 Fatty Acids (FISH OIL) 1000 MG CAPS, Take 2 capsules by mouth daily., Disp: , Rfl:   Allergies:  Allergies  Allergen Reactions  . Ilevro [Nepafenac]   . Adhesive [Tape] Dermatitis  . Codeine   . Latex   . Mold Extract [Trichophyton Mentagrophyte]   . Neomycin   . Nucynta [Tapentadol] Nausea And Vomiting  . Tramadol     dizziness    Past Medical History, Surgical history, Social history, and Family History were reviewed and updated.  Review of Systems: As above  Physical Exam:  height is _0  (1.651 m) and weight is 177 lb (80.287 kg). Her oral temperature is 98 F (36.7 C). Her blood pressure is 119/76 and her pulse is 95. Her respiration is 14.   Well-developed and well-nourished white female. Head and exam has no ocular or oral lesions. She has no palpable cervical or supraclavicular lymph nodes. Lungs are clear bilateral. Cardiac exam regular rate and rhythm with no murmurs rubs or bruits. Abdomen is soft. Has good bowel sounds. There is no fluid wave. Is a palpable liver or spleen tip. Breast exam shows left breast with well-healed lumpectomy at the 9:00 position. No masses noted in the left  breast. There is no left axillary adenopathy. Right breast is somewhat contracted from radiation and surgery. She has a well-healed lumpectomy at the 4:00 position. There is no mass in the right breast. There is no right axillary adenopathy. Extremities shows no clubbing cyanosis or edema. Neurological exam shows no focal neurological deficits. Skin exam no rashes ecchymosis or petechia. Lab Results  Component Value Date   WBC 10.1* 04/03/2015   HGB 14.2 04/03/2015   HCT 40.1 04/03/2015   MCV 93 04/03/2015   PLT 171 04/03/2015     Chemistry      Component Value Date/Time   NA 139 10/03/2014 1440   NA 140 04/01/2012 0336   K 4.1  10/03/2014 1440   K 3.9 04/01/2012 0336   CL 104 10/03/2014 1440   CL 107 04/01/2012 0336   CO2 24 10/03/2014 1440   CO2 27 04/01/2012 0336   BUN 16 10/03/2014 1440   BUN 9 04/01/2012 0336   CREATININE 0.88 10/03/2014 1440   CREATININE 0.85 12/30/2012 1703   CREATININE 0.99 06/22/2012 1054      Component Value Date/Time   CALCIUM 9.5 10/03/2014 1440   CALCIUM 8.9 04/01/2012 0336   ALKPHOS 64 10/03/2014 1440   AST 21 10/03/2014 1440   ALT 22 10/03/2014 1440   BILITOT 0.8 10/03/2014 1440         Impression and Plan: Jacqueline Mata is 74 year old white female with history of synchronous bilateral breast cancer. Fortunately both breast cancers were node negative.  For now, we will plan to get her back in another 6 months.  Has been over 10 years since she had treatment. Again she's done incredibly well. I really think that she is cured.   Volanda Napoleon, MD 7/6/20165:29 PM

## 2015-04-04 LAB — VITAMIN D 25 HYDROXY (VIT D DEFICIENCY, FRACTURES): Vit D, 25-Hydroxy: 36 ng/mL (ref 30–100)

## 2015-04-05 DIAGNOSIS — M5442 Lumbago with sciatica, left side: Secondary | ICD-10-CM | POA: Diagnosis not present

## 2015-04-09 ENCOUNTER — Ambulatory Visit (INDEPENDENT_AMBULATORY_CARE_PROVIDER_SITE_OTHER): Payer: Medicare Other | Admitting: Family Medicine

## 2015-04-09 ENCOUNTER — Encounter: Payer: Self-pay | Admitting: Family Medicine

## 2015-04-09 VITALS — BP 112/70 | HR 76 | Ht 65.0 in | Wt 176.0 lb

## 2015-04-09 DIAGNOSIS — M4722 Other spondylosis with radiculopathy, cervical region: Secondary | ICD-10-CM

## 2015-04-09 DIAGNOSIS — M47812 Spondylosis without myelopathy or radiculopathy, cervical region: Secondary | ICD-10-CM | POA: Diagnosis not present

## 2015-04-09 NOTE — Patient Instructions (Signed)
It good to see you Jacqueline Mata is your friend Continue the posture exercises at this time Start to decrease turmeric to 3 times a week for 2 weeks then try to discontinue.  If pain come back the restart Same with the glucosamine Continue the vitamin D indefinitely See me again in 6 weeks or when you need me.

## 2015-04-09 NOTE — Progress Notes (Signed)
Pre visit review using our clinic review tool, if applicable. No additional management support is needed unless otherwise documented below in the visit note. 

## 2015-04-09 NOTE — Progress Notes (Signed)
  [facility] [address] [place], [place] [address] Phone: [phone] Subjective:    CC: shoulder pain follow up  [id number] Jacqueline Mata is a 74 y.o. female coming in with complaint of shoulder pain bilaterally.  Patient does have x-rays showing diffuse degenerative changes of the cervical spine. Patient hasn't seen a chiropractor as well which has been beneficial. Patient has been doing the exercises and the postural control exercises on a regular basis. Patient is also been attempting to titrate down off the gabapentin as well as her Celebrex. Patient states she continues to improve. Patient is actually off the Celebrex as well as gabapentin at this time. Patient is also trying to get off the natural supplementations. Patient denies any numbness or tingling. States that some mild discomfort of the acromioclavicular joint but otherwise nothing that is stopping her from activities. Patient is feeling very good at this time.     Past medical history, social, surgical and family history all reviewed in electronic medical record.   Review of Systems: No headache, visual changes, nausea, vomiting, diarrhea, constipation, dizziness, abdominal pain, skin rash, fevers, chills, night sweats, weight loss, swollen lymph nodes, body aches, joint swelling, muscle aches, chest pain, shortness of breath, mood changes.   Objective Blood pressure 112/70, pulse 76, height 5\' 5"  (1.651 m), weight 176 lb (79.833 kg), SpO2 96 %.  General: No apparent distress alert and oriented x3 mood and affect normal, dressed appropriately.  HEENT: Pupils equal, extraocular movements intact  Respiratory: Patient's speak in full sentences and does not appear short of breath  Cardiovascular: No lower extremity edema, non tender, no erythema  Skin: Warm dry intact with no signs of infection or rash on extremities or on axial skeleton.  Abdomen: Soft nontender  Neuro: Cranial nerves II  through XII are intact, neurovascularly intact in all extremities with 2+ DTRs and 2+ pulses.  Lymph: No lymphadenopathy of posterior or anterior cervical chain or axillae bilaterally.  Gait normal with good balance and coordination.  MSK:  Non tender with full range of motion and good stability and symmetric strength and tone of  elbows, wrist, hip, knee and ankles bilaterally.  Neck: Inspection reveals mild increasing kyphosis. No palpable stepoffs. Negative Spurling's maneuver. Improving range of motion Grip strength and sensation normal in bilateral hands Strength good C4 to T1 distribution No sensory change to C4 to T1 Negative Hoffman sign bilaterally Reflexes normal  Shoulder:right Inspection reveals no abnormalities, atrophy or asymmetry. ROM is full in all planes passively. Rotator cuff strength normal throughout. Minimal impingement signs Speeds and Yergason's tests normal. No labral pathology noted with negative Obrien's, negative clunk and good stability. Normal scapular function observed. No painful arc and no drop arm sign. Pain over the acromial clavicular joint No apprehension sign Improved crossover test Contralateral shoulder unremarkable    Impression and Recommendations:     This case required medical decision making of moderate complexity.

## 2015-04-09 NOTE — Assessment & Plan Note (Signed)
Patient has been doing remarkably well with the conservative therapy. Patient continue with the postural changes as well as the scapular strengthening that will help with decreasing the stress on the cervical spine itself. We discussed also the possibility that some of the shoulder pain is secondary to the acromial clavicular arthritis an injection could be done if necessary. We discussed continuing with the conservative therapy. Patient will come back and see me on an as-needed basis.

## 2015-04-10 DIAGNOSIS — M5136 Other intervertebral disc degeneration, lumbar region: Secondary | ICD-10-CM | POA: Diagnosis not present

## 2015-04-10 DIAGNOSIS — M9903 Segmental and somatic dysfunction of lumbar region: Secondary | ICD-10-CM | POA: Diagnosis not present

## 2015-04-10 DIAGNOSIS — M5416 Radiculopathy, lumbar region: Secondary | ICD-10-CM | POA: Diagnosis not present

## 2015-04-10 DIAGNOSIS — M9905 Segmental and somatic dysfunction of pelvic region: Secondary | ICD-10-CM | POA: Diagnosis not present

## 2015-04-12 DIAGNOSIS — D2262 Melanocytic nevi of left upper limb, including shoulder: Secondary | ICD-10-CM | POA: Diagnosis not present

## 2015-04-12 DIAGNOSIS — Z85828 Personal history of other malignant neoplasm of skin: Secondary | ICD-10-CM | POA: Diagnosis not present

## 2015-04-12 DIAGNOSIS — D2271 Melanocytic nevi of right lower limb, including hip: Secondary | ICD-10-CM | POA: Diagnosis not present

## 2015-04-12 DIAGNOSIS — D225 Melanocytic nevi of trunk: Secondary | ICD-10-CM | POA: Diagnosis not present

## 2015-04-15 ENCOUNTER — Encounter: Payer: Self-pay | Admitting: Genetic Counselor

## 2015-04-22 DIAGNOSIS — H26492 Other secondary cataract, left eye: Secondary | ICD-10-CM | POA: Diagnosis not present

## 2015-04-23 DIAGNOSIS — M5416 Radiculopathy, lumbar region: Secondary | ICD-10-CM | POA: Diagnosis not present

## 2015-04-23 DIAGNOSIS — M9903 Segmental and somatic dysfunction of lumbar region: Secondary | ICD-10-CM | POA: Diagnosis not present

## 2015-04-23 DIAGNOSIS — M9905 Segmental and somatic dysfunction of pelvic region: Secondary | ICD-10-CM | POA: Diagnosis not present

## 2015-04-23 DIAGNOSIS — M5136 Other intervertebral disc degeneration, lumbar region: Secondary | ICD-10-CM | POA: Diagnosis not present

## 2015-05-07 DIAGNOSIS — M9903 Segmental and somatic dysfunction of lumbar region: Secondary | ICD-10-CM | POA: Diagnosis not present

## 2015-05-07 DIAGNOSIS — M9905 Segmental and somatic dysfunction of pelvic region: Secondary | ICD-10-CM | POA: Diagnosis not present

## 2015-05-07 DIAGNOSIS — M5416 Radiculopathy, lumbar region: Secondary | ICD-10-CM | POA: Diagnosis not present

## 2015-05-07 DIAGNOSIS — M5136 Other intervertebral disc degeneration, lumbar region: Secondary | ICD-10-CM | POA: Diagnosis not present

## 2015-05-13 DIAGNOSIS — H5711 Ocular pain, right eye: Secondary | ICD-10-CM | POA: Diagnosis not present

## 2015-05-16 ENCOUNTER — Encounter: Payer: Self-pay | Admitting: Family Medicine

## 2015-05-16 DIAGNOSIS — H0013 Chalazion right eye, unspecified eyelid: Secondary | ICD-10-CM | POA: Diagnosis not present

## 2015-05-21 ENCOUNTER — Ambulatory Visit: Payer: Medicare Other | Admitting: Family Medicine

## 2015-05-21 DIAGNOSIS — H00012 Hordeolum externum right lower eyelid: Secondary | ICD-10-CM | POA: Diagnosis not present

## 2015-05-22 DIAGNOSIS — M9905 Segmental and somatic dysfunction of pelvic region: Secondary | ICD-10-CM | POA: Diagnosis not present

## 2015-05-22 DIAGNOSIS — M9903 Segmental and somatic dysfunction of lumbar region: Secondary | ICD-10-CM | POA: Diagnosis not present

## 2015-05-22 DIAGNOSIS — M5136 Other intervertebral disc degeneration, lumbar region: Secondary | ICD-10-CM | POA: Diagnosis not present

## 2015-05-22 DIAGNOSIS — M5416 Radiculopathy, lumbar region: Secondary | ICD-10-CM | POA: Diagnosis not present

## 2015-05-23 DIAGNOSIS — L821 Other seborrheic keratosis: Secondary | ICD-10-CM | POA: Diagnosis not present

## 2015-05-28 DIAGNOSIS — H0013 Chalazion right eye, unspecified eyelid: Secondary | ICD-10-CM | POA: Diagnosis not present

## 2015-05-28 DIAGNOSIS — Z96653 Presence of artificial knee joint, bilateral: Secondary | ICD-10-CM | POA: Diagnosis not present

## 2015-06-05 DIAGNOSIS — M5416 Radiculopathy, lumbar region: Secondary | ICD-10-CM | POA: Diagnosis not present

## 2015-06-05 DIAGNOSIS — M9905 Segmental and somatic dysfunction of pelvic region: Secondary | ICD-10-CM | POA: Diagnosis not present

## 2015-06-05 DIAGNOSIS — M5136 Other intervertebral disc degeneration, lumbar region: Secondary | ICD-10-CM | POA: Diagnosis not present

## 2015-06-05 DIAGNOSIS — M9903 Segmental and somatic dysfunction of lumbar region: Secondary | ICD-10-CM | POA: Diagnosis not present

## 2015-06-19 DIAGNOSIS — M5136 Other intervertebral disc degeneration, lumbar region: Secondary | ICD-10-CM | POA: Diagnosis not present

## 2015-06-19 DIAGNOSIS — M9903 Segmental and somatic dysfunction of lumbar region: Secondary | ICD-10-CM | POA: Diagnosis not present

## 2015-06-19 DIAGNOSIS — M9905 Segmental and somatic dysfunction of pelvic region: Secondary | ICD-10-CM | POA: Diagnosis not present

## 2015-06-19 DIAGNOSIS — M5416 Radiculopathy, lumbar region: Secondary | ICD-10-CM | POA: Diagnosis not present

## 2015-07-03 DIAGNOSIS — Z23 Encounter for immunization: Secondary | ICD-10-CM | POA: Diagnosis not present

## 2015-07-11 DIAGNOSIS — M9905 Segmental and somatic dysfunction of pelvic region: Secondary | ICD-10-CM | POA: Diagnosis not present

## 2015-07-11 DIAGNOSIS — M5136 Other intervertebral disc degeneration, lumbar region: Secondary | ICD-10-CM | POA: Diagnosis not present

## 2015-07-11 DIAGNOSIS — M9903 Segmental and somatic dysfunction of lumbar region: Secondary | ICD-10-CM | POA: Diagnosis not present

## 2015-07-11 DIAGNOSIS — M5416 Radiculopathy, lumbar region: Secondary | ICD-10-CM | POA: Diagnosis not present

## 2015-07-24 ENCOUNTER — Ambulatory Visit (INDEPENDENT_AMBULATORY_CARE_PROVIDER_SITE_OTHER): Payer: Medicare Other | Admitting: Internal Medicine

## 2015-07-24 ENCOUNTER — Encounter: Payer: Self-pay | Admitting: Internal Medicine

## 2015-07-24 VITALS — BP 118/72 | HR 87 | Temp 98.0°F | Resp 12 | Ht 65.0 in | Wt 175.0 lb

## 2015-07-24 DIAGNOSIS — E119 Type 2 diabetes mellitus without complications: Secondary | ICD-10-CM

## 2015-07-24 DIAGNOSIS — E038 Other specified hypothyroidism: Secondary | ICD-10-CM

## 2015-07-24 DIAGNOSIS — M4712 Other spondylosis with myelopathy, cervical region: Secondary | ICD-10-CM | POA: Diagnosis not present

## 2015-07-24 DIAGNOSIS — M8580 Other specified disorders of bone density and structure, unspecified site: Secondary | ICD-10-CM

## 2015-07-24 DIAGNOSIS — M47812 Spondylosis without myelopathy or radiculopathy, cervical region: Secondary | ICD-10-CM

## 2015-07-24 DIAGNOSIS — Z96653 Presence of artificial knee joint, bilateral: Secondary | ICD-10-CM

## 2015-07-24 DIAGNOSIS — E034 Atrophy of thyroid (acquired): Secondary | ICD-10-CM

## 2015-07-24 DIAGNOSIS — E785 Hyperlipidemia, unspecified: Secondary | ICD-10-CM | POA: Diagnosis not present

## 2015-07-24 DIAGNOSIS — M4722 Other spondylosis with radiculopathy, cervical region: Secondary | ICD-10-CM

## 2015-07-24 LAB — LIPID PANEL
CHOL/HDL RATIO: 3
Cholesterol: 234 mg/dL — ABNORMAL HIGH (ref 0–200)
HDL: 67.3 mg/dL (ref >39.00)
LDL CALC: 149 mg/dL — AB (ref 0–99)
NonHDL: 166.44
Triglycerides: 88 mg/dL (ref 0.0–149.0)
VLDL: 17.6 mg/dL (ref 0.0–40.0)

## 2015-07-24 LAB — COMPREHENSIVE METABOLIC PANEL
ALT: 22 U/L (ref 0–35)
AST: 16 U/L (ref 0–37)
Albumin: 4.2 g/dL (ref 3.5–5.2)
Alkaline Phosphatase: 66 U/L (ref 39–117)
BUN: 13 mg/dL (ref 6–23)
CHLORIDE: 103 meq/L (ref 96–112)
CO2: 26 meq/L (ref 19–32)
Calcium: 9.9 mg/dL (ref 8.4–10.5)
Creatinine, Ser: 0.85 mg/dL (ref 0.40–1.20)
GFR: 69.57 mL/min (ref >60.00)
Glucose, Bld: 139 mg/dL — ABNORMAL HIGH (ref 70–99)
Potassium: 4.1 mEq/L (ref 3.5–5.1)
Sodium: 138 mEq/L (ref 135–145)
Total Bilirubin: 1.1 mg/dL (ref 0.2–1.2)
Total Protein: 7.4 g/dL (ref 6.0–8.3)

## 2015-07-24 LAB — MICROALBUMIN / CREATININE URINE RATIO
CREATININE, U: 61.2 mg/dL
MICROALB/CREAT RATIO: 1.1 mg/g (ref 0.0–30.0)

## 2015-07-24 LAB — TSH: TSH: 0.82 u[IU]/mL (ref 0.35–4.50)

## 2015-07-24 LAB — LDL CHOLESTEROL, DIRECT: LDL DIRECT: 152 mg/dL

## 2015-07-24 LAB — HEMOGLOBIN A1C: HEMOGLOBIN A1C: 6.6 % — AB (ref 4.6–6.5)

## 2015-07-24 NOTE — Progress Notes (Signed)
Pre-visit discussion using our clinic review tool. No additional management support is needed unless otherwise documented below in the visit note.  

## 2015-07-24 NOTE — Patient Instructions (Signed)
I am ordering an MRI of the cervical spine to rule out stenosis  DEXA scan for bone density testing also ordered  You can try taking turmeric  For your muscle cramps,  It is available at most health food stores  Muscles should always be warm when stretched..  You can try the exercises I have given you daily
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
## 2015-07-27 ENCOUNTER — Encounter: Payer: Self-pay | Admitting: Internal Medicine

## 2015-07-27 ENCOUNTER — Telehealth: Payer: Self-pay | Admitting: Internal Medicine

## 2015-07-27 NOTE — Assessment & Plan Note (Signed)
Now taking88 mcg daily will recheck TSH   Lab Results  Component Value Date   TSH 0.82 07/24/2015

## 2015-07-27 NOTE — Assessment & Plan Note (Signed)
Historically well-controlled on current medications.  hemoglobin A1c has been consistently at or  less than 7.0 . Patient is up-to-date on eye exams and foot exam is normal today. Patient is due for urine microalbumin to creatinine ratio at next visit. Patient is tolerating statin therapy for CAD risk reduction and on ACE/ARB for reduction in proteinuria.  Current labs are pending   Lab Results  Component Value Date   HGBA1C 6.6* 07/24/2015   Lab Results  Component Value Date   MICROALBUR <0.7 07/24/2015

## 2015-07-27 NOTE — Telephone Encounter (Signed)
Cr Collected on Friday

## 2015-07-27 NOTE — Assessment & Plan Note (Addendum)
She is frustrated ant the persistent pain despite months of PT and prior intraarticular injections, now using chirpractinc manipulation with no appreciable improvement   Will rule out foraminal stenosis from spondylosis with MRI cervical spine.

## 2015-07-27 NOTE — Assessment & Plan Note (Signed)
Untreated due to statin intolerance with history of multiple trials causiing myalgias.  Her current 10 yr risk of developing CAD based on Framingham criteria is 15% .  Lab Results  Component Value Date   CHOL 234* 07/24/2015   HDL 67.30 07/24/2015   LDLCALC 149* 07/24/2015   LDLDIRECT 152.0 07/24/2015   TRIG 88.0 07/24/2015   CHOLHDL 3 07/24/2015

## 2015-07-27 NOTE — Assessment & Plan Note (Signed)
Bilateral,  Done by Duayne Cal.  Annual check was done in July and plain films showed proper alignemnt per review  of ortho notes.

## 2015-08-05 ENCOUNTER — Ambulatory Visit
Admission: RE | Admit: 2015-08-05 | Discharge: 2015-08-05 | Disposition: A | Discharge status: Home / Self Care | Payer: Medicare Other | Source: Ambulatory Visit | Attending: Internal Medicine | Admitting: Internal Medicine

## 2015-08-05 ENCOUNTER — Encounter: Payer: Self-pay | Admitting: Internal Medicine

## 2015-08-05 DIAGNOSIS — M5030 Other cervical disc degeneration, unspecified cervical region: Secondary | ICD-10-CM | POA: Insufficient documentation

## 2015-08-05 DIAGNOSIS — M4712 Other spondylosis with myelopathy, cervical region: Secondary | ICD-10-CM

## 2015-08-05 DIAGNOSIS — M47892 Other spondylosis, cervical region: Secondary | ICD-10-CM | POA: Insufficient documentation

## 2015-08-05 DIAGNOSIS — M47812 Spondylosis without myelopathy or radiculopathy, cervical region: Secondary | ICD-10-CM | POA: Diagnosis not present

## 2015-08-08 DIAGNOSIS — M9903 Segmental and somatic dysfunction of lumbar region: Secondary | ICD-10-CM | POA: Diagnosis not present

## 2015-08-08 DIAGNOSIS — M9905 Segmental and somatic dysfunction of pelvic region: Secondary | ICD-10-CM | POA: Diagnosis not present

## 2015-08-08 DIAGNOSIS — M5136 Other intervertebral disc degeneration, lumbar region: Secondary | ICD-10-CM | POA: Diagnosis not present

## 2015-08-08 DIAGNOSIS — M5416 Radiculopathy, lumbar region: Secondary | ICD-10-CM | POA: Diagnosis not present

## 2015-08-25 ENCOUNTER — Other Ambulatory Visit: Payer: Self-pay | Admitting: Internal Medicine

## 2015-09-05 DIAGNOSIS — M9903 Segmental and somatic dysfunction of lumbar region: Secondary | ICD-10-CM | POA: Diagnosis not present

## 2015-09-05 DIAGNOSIS — M9905 Segmental and somatic dysfunction of pelvic region: Secondary | ICD-10-CM | POA: Diagnosis not present

## 2015-09-05 DIAGNOSIS — M5136 Other intervertebral disc degeneration, lumbar region: Secondary | ICD-10-CM | POA: Diagnosis not present

## 2015-09-05 DIAGNOSIS — M5416 Radiculopathy, lumbar region: Secondary | ICD-10-CM | POA: Diagnosis not present

## 2015-09-27 ENCOUNTER — Telehealth: Payer: Self-pay | Admitting: Hematology & Oncology

## 2015-09-27 NOTE — Telephone Encounter (Signed)
Per Md to cx 10/03/15 apt due to him not being in office.  Appt was resch for 10/24/15.  I called and left a message of changed apt with new date/time and mailed calendar to patient's  home

## 2015-10-03 ENCOUNTER — Other Ambulatory Visit: Payer: Medicare Other

## 2015-10-03 ENCOUNTER — Ambulatory Visit: Payer: Medicare Other | Admitting: Hematology & Oncology

## 2015-10-09 DIAGNOSIS — Z85828 Personal history of other malignant neoplasm of skin: Secondary | ICD-10-CM | POA: Diagnosis not present

## 2015-10-09 DIAGNOSIS — M5136 Other intervertebral disc degeneration, lumbar region: Secondary | ICD-10-CM | POA: Diagnosis not present

## 2015-10-09 DIAGNOSIS — M9905 Segmental and somatic dysfunction of pelvic region: Secondary | ICD-10-CM | POA: Diagnosis not present

## 2015-10-09 DIAGNOSIS — M5416 Radiculopathy, lumbar region: Secondary | ICD-10-CM | POA: Diagnosis not present

## 2015-10-09 DIAGNOSIS — L718 Other rosacea: Secondary | ICD-10-CM | POA: Diagnosis not present

## 2015-10-09 DIAGNOSIS — L309 Dermatitis, unspecified: Secondary | ICD-10-CM | POA: Diagnosis not present

## 2015-10-09 DIAGNOSIS — M9903 Segmental and somatic dysfunction of lumbar region: Secondary | ICD-10-CM | POA: Diagnosis not present

## 2015-10-23 DIAGNOSIS — M5136 Other intervertebral disc degeneration, lumbar region: Secondary | ICD-10-CM | POA: Diagnosis not present

## 2015-10-23 DIAGNOSIS — M9905 Segmental and somatic dysfunction of pelvic region: Secondary | ICD-10-CM | POA: Diagnosis not present

## 2015-10-23 DIAGNOSIS — M5416 Radiculopathy, lumbar region: Secondary | ICD-10-CM | POA: Diagnosis not present

## 2015-10-23 DIAGNOSIS — M9903 Segmental and somatic dysfunction of lumbar region: Secondary | ICD-10-CM | POA: Diagnosis not present

## 2015-10-24 ENCOUNTER — Encounter: Payer: Self-pay | Admitting: Hematology & Oncology

## 2015-10-24 ENCOUNTER — Ambulatory Visit (HOSPITAL_BASED_OUTPATIENT_CLINIC_OR_DEPARTMENT_OTHER): Payer: PPO | Admitting: Hematology & Oncology

## 2015-10-24 ENCOUNTER — Other Ambulatory Visit (HOSPITAL_BASED_OUTPATIENT_CLINIC_OR_DEPARTMENT_OTHER): Payer: PPO

## 2015-10-24 VITALS — BP 114/85 | HR 79 | Temp 98.1°F | Resp 16 | Ht 65.0 in | Wt 178.0 lb

## 2015-10-24 DIAGNOSIS — Z853 Personal history of malignant neoplasm of breast: Secondary | ICD-10-CM

## 2015-10-24 DIAGNOSIS — D0590 Unspecified type of carcinoma in situ of unspecified breast: Secondary | ICD-10-CM

## 2015-10-24 DIAGNOSIS — M810 Age-related osteoporosis without current pathological fracture: Secondary | ICD-10-CM

## 2015-10-24 DIAGNOSIS — E559 Vitamin D deficiency, unspecified: Secondary | ICD-10-CM

## 2015-10-24 DIAGNOSIS — E119 Type 2 diabetes mellitus without complications: Secondary | ICD-10-CM

## 2015-10-24 LAB — CBC WITH DIFFERENTIAL (CANCER CENTER ONLY)
BASO#: 0.1 10*3/uL (ref 0.0–0.2)
BASO%: 1 % (ref 0.0–2.0)
EOS%: 3.5 % (ref 0.0–7.0)
Eosinophils Absolute: 0.3 10*3/uL (ref 0.0–0.5)
HEMATOCRIT: 42.2 % (ref 34.8–46.6)
HGB: 14.8 g/dL (ref 11.6–15.9)
LYMPH#: 2.2 10*3/uL (ref 0.9–3.3)
LYMPH%: 30.5 % (ref 14.0–48.0)
MCH: 31.3 pg (ref 26.0–34.0)
MCHC: 35.1 g/dL (ref 32.0–36.0)
MCV: 89 fL (ref 81–101)
MONO#: 0.6 10*3/uL (ref 0.1–0.9)
MONO%: 7.6 % (ref 0.0–13.0)
NEUT%: 57.4 % (ref 39.6–80.0)
NEUTROS ABS: 4.1 10*3/uL (ref 1.5–6.5)
Platelets: 174 10*3/uL (ref 145–400)
RBC: 4.73 10*6/uL (ref 3.70–5.32)
RDW: 12.8 % (ref 11.1–15.7)
WBC: 7.2 10*3/uL (ref 3.9–10.0)

## 2015-10-24 LAB — COMPREHENSIVE METABOLIC PANEL
ALBUMIN: 4 g/dL (ref 3.5–5.0)
ALK PHOS: 70 U/L (ref 40–150)
ALT: 22 U/L (ref 0–55)
AST: 20 U/L (ref 5–34)
Anion Gap: 8 mEq/L (ref 3–11)
BILIRUBIN TOTAL: 1.17 mg/dL (ref 0.20–1.20)
BUN: 13.6 mg/dL (ref 7.0–26.0)
CALCIUM: 9.7 mg/dL (ref 8.4–10.4)
CO2: 23 mEq/L (ref 22–29)
CREATININE: 0.9 mg/dL (ref 0.6–1.1)
Chloride: 107 mEq/L (ref 98–109)
EGFR: 63 mL/min/{1.73_m2} — ABNORMAL LOW (ref >90)
GLUCOSE: 128 mg/dL (ref 70–140)
Potassium: 4.1 mEq/L (ref 3.5–5.1)
SODIUM: 138 meq/L (ref 136–145)
TOTAL PROTEIN: 7.4 g/dL (ref 6.4–8.3)

## 2015-10-24 NOTE — Progress Notes (Signed)
Hematology and Oncology Follow Up Visit  Jacqueline Mata [id number] 10-27-1940 75 y.o. 10/24/2015   Principle Diagnosis:  Synchronous bilateral stage IIA (T2 N0 M0) ductal carcinoma of bilateral breast.  Current Therapy:    Observation     Interim History:  Ms.  Jacqueline Mata is comes in for followup. Last saw her back in July. She is doing okay. She's had no problems since we last saw her. She had a good holiday season. She did not go anywhere.  As always, she showing pictures of her 2 dogs. She really is dedicated to them.  She's had no problems with cough. She's had no infections. She's had no rashes. She's had no nausea or vomiting. She's had no change in bowel or bladder habits. There's been no leg swelling. She really has had no change in medications.  Overall, her performance status is ECOG 1.   Medications:  Current outpatient prescriptions:  .  aspirin 81 MG tablet, Take 81 mg by mouth daily., Disp: , Rfl:  .  azelastine (ASTELIN) 0.1 % nasal spray, Place 2 sprays into both nostrils 2 (two) times daily as needed for rhinitis. Use in each nostril as directed, Disp: 30 mL, Rfl: 5 .  Blood Glucose Monitoring Suppl KIT, by Does not apply route., Disp: , Rfl:  .  Cholecalciferol (VITAMIN D3) 1000 UNITS CAPS, Take 2 capsules by mouth daily. , Disp: , Rfl:  .  fluocinonide cream (LIDEX) 5.42 %, Apply 1 application topically daily as needed., Disp: , Rfl:  .  fluticasone (FLONASE) 50 MCG/ACT nasal spray, Place into both nostrils daily. Place 2 spriay into both nostrils daily, Disp: , Rfl:  .  folic acid (FOLVITE) 1 MG tablet, Take 1 mg by mouth daily., Disp: , Rfl:  .  glucose blood (ACCU-CHEK AVIVA PLUS) test strip, Use to check blood sugar once daily., Disp: 100 each, Rfl: 12 .  Lancets (ACCU-CHEK MULTICLIX) lancets, Use once daily to check blood sugars   Dx:E11.9, Disp: 100 each, Rfl: 3 .  levothyroxine (SYNTHROID, LEVOTHROID) 88 MCG tablet, TAKE ONE TABLET BY MOUTH BEFORE BREAKFAST,  Disp: 90 tablet, Rfl: 1 .  metroNIDAZOLE (METROGEL) 1 % gel, Apply topically daily. to face, Disp: , Rfl: 0  Allergies:  Allergies  Allergen Reactions  . Ilevro [Nepafenac]   . Adhesive [Tape] Dermatitis  . Codeine   . Latex   . Mold Extract [Trichophyton Mentagrophyte]   . Neomycin   . Nucynta [Tapentadol] Nausea And Vomiting  . Neomycin-Polymyxin-Hc Rash  . Tramadol     dizziness    Past Medical History, Surgical history, Social history, and Family History were reviewed and updated.  Review of Systems: As above  Physical Exam:  height is '5\' 5"'$  (1.651 m) and weight is 178 lb (80.74 kg). Her oral temperature is 98.1 F (36.7 C). Her blood pressure is 114/85 and her pulse is 79. Her respiration is 16.   Well-developed and well-nourished white female. Head and exam has no ocular or oral lesions. She has no palpable cervical or supraclavicular lymph nodes. Lungs are clear bilateral. Cardiac exam regular rate and rhythm with no murmurs rubs or bruits. Abdomen is soft. Has good bowel sounds. There is no fluid wave. Is a palpable liver or spleen tip. Breast exam shows left breast with well-healed lumpectomy at the 9:00 position. No masses noted in the left breast. There is no left axillary adenopathy. Right breast is somewhat contracted from radiation and surgery. She has a well-healed lumpectomy at the  4:00 position. There is no mass in the right breast. There is no right axillary adenopathy. Extremities shows no clubbing cyanosis or edema. Neurological exam shows no focal neurological deficits. Skin exam no rashes ecchymosis or petechia. Lab Results  Component Value Date   WBC 7.2 10/24/2015   HGB 14.8 10/24/2015   HCT 42.2 10/24/2015   MCV 89 10/24/2015   PLT 174 10/24/2015     Chemistry      Component Value Date/Time   NA 138 07/24/2015 1038   NA 140 04/01/2012 0336   K 4.1 07/24/2015 1038   K 3.9 04/01/2012 0336   CL 103 07/24/2015 1038   CL 107 04/01/2012 0336   CO2 26  07/24/2015 1038   CO2 27 04/01/2012 0336   BUN 13 07/24/2015 1038   BUN 9 04/01/2012 0336   CREATININE 0.85 07/24/2015 1038   CREATININE 0.85 12/30/2012 1703   CREATININE 0.99 06/22/2012 1054      Component Value Date/Time   CALCIUM 9.9 07/24/2015 1038   CALCIUM 8.9 04/01/2012 0336   ALKPHOS 66 07/24/2015 1038   AST 16 07/24/2015 1038   ALT 22 07/24/2015 1038   BILITOT 1.1 07/24/2015 1038         Impression and Plan: [name] is 75 year old white female with history of synchronous bilateral breast cancer. Fortunately both breast cancers were node negative.  For now, we will plan to get her back in another 6 months.  It has been over 10 years since she had treatment. Again she's done incredibly well. I really think that she is cured.   Jacqueline Napoleon, MD 1/26/201711:06 AM

## 2015-10-25 ENCOUNTER — Telehealth: Payer: Self-pay | Admitting: Oncology

## 2015-10-25 LAB — VITAMIN D 25 HYDROXY (VIT D DEFICIENCY, FRACTURES): Vitamin D, 25-Hydroxy: 44.3 ng/mL (ref 30.0–100.0)

## 2015-10-25 NOTE — Telephone Encounter (Addendum)
-----   Message from Volanda Napoleon, MD sent at 10/25/2015  9:42 AM EST ----- Call - vit D is better!! Jacqueline Mata with patient, gave message, verbalized understanding.

## 2015-11-06 DIAGNOSIS — M9905 Segmental and somatic dysfunction of pelvic region: Secondary | ICD-10-CM | POA: Diagnosis not present

## 2015-11-06 DIAGNOSIS — M9903 Segmental and somatic dysfunction of lumbar region: Secondary | ICD-10-CM | POA: Diagnosis not present

## 2015-11-06 DIAGNOSIS — M5136 Other intervertebral disc degeneration, lumbar region: Secondary | ICD-10-CM | POA: Diagnosis not present

## 2015-11-06 DIAGNOSIS — M5416 Radiculopathy, lumbar region: Secondary | ICD-10-CM | POA: Diagnosis not present

## 2015-11-08 ENCOUNTER — Telehealth: Payer: Self-pay | Admitting: Internal Medicine

## 2015-11-08 DIAGNOSIS — D0591 Unspecified type of carcinoma in situ of right breast: Secondary | ICD-10-CM

## 2015-11-08 NOTE — Telephone Encounter (Signed)
Please order mammo for [email or web address].msn

## 2015-11-08 NOTE — Telephone Encounter (Signed)
Diagnostic mammogram ordered.  

## 2015-11-11 ENCOUNTER — Encounter: Payer: Self-pay | Admitting: Internal Medicine

## 2015-11-12 DIAGNOSIS — Z961 Presence of intraocular lens: Secondary | ICD-10-CM | POA: Diagnosis not present

## 2015-11-12 LAB — HM DIABETES EYE EXAM

## 2015-11-14 ENCOUNTER — Encounter: Payer: Self-pay | Admitting: Internal Medicine

## 2015-11-20 DIAGNOSIS — M5416 Radiculopathy, lumbar region: Secondary | ICD-10-CM | POA: Diagnosis not present

## 2015-11-20 DIAGNOSIS — M5136 Other intervertebral disc degeneration, lumbar region: Secondary | ICD-10-CM | POA: Diagnosis not present

## 2015-11-20 DIAGNOSIS — M9903 Segmental and somatic dysfunction of lumbar region: Secondary | ICD-10-CM | POA: Diagnosis not present

## 2015-11-20 DIAGNOSIS — M9905 Segmental and somatic dysfunction of pelvic region: Secondary | ICD-10-CM | POA: Diagnosis not present

## 2015-11-28 ENCOUNTER — Ambulatory Visit: Payer: PPO

## 2015-12-02 ENCOUNTER — Ambulatory Visit: Payer: PPO

## 2015-12-04 DIAGNOSIS — M5136 Other intervertebral disc degeneration, lumbar region: Secondary | ICD-10-CM | POA: Diagnosis not present

## 2015-12-04 DIAGNOSIS — M5416 Radiculopathy, lumbar region: Secondary | ICD-10-CM | POA: Diagnosis not present

## 2015-12-04 DIAGNOSIS — M9905 Segmental and somatic dysfunction of pelvic region: Secondary | ICD-10-CM | POA: Diagnosis not present

## 2015-12-04 DIAGNOSIS — M9903 Segmental and somatic dysfunction of lumbar region: Secondary | ICD-10-CM | POA: Diagnosis not present

## 2015-12-09 ENCOUNTER — Ambulatory Visit (INDEPENDENT_AMBULATORY_CARE_PROVIDER_SITE_OTHER): Payer: PPO

## 2015-12-09 VITALS — BP 118/70 | HR 77 | Temp 98.1°F | Resp 14 | Ht 64.25 in | Wt 180.0 lb

## 2015-12-09 DIAGNOSIS — Z0000 Encounter for general adult medical examination without abnormal findings: Secondary | ICD-10-CM | POA: Diagnosis not present

## 2015-12-09 NOTE — Progress Notes (Signed)
  I have reviewed the above information and agree with above.   Cairo Agostinelli, MD 

## 2015-12-09 NOTE — Patient Instructions (Addendum)
  [name] , Thank you for taking time to come for your Medicare Wellness Visit. I appreciate your ongoing commitment to your health goals. Please review the following plan we discussed and let me know if I can assist you in the future.     This is a list of the screening recommended for you and due dates:  Health Maintenance  Topic Date Due  . DEXA scan (bone density measurement)  01/02/2007  . Complete foot exam   12/19/2015  . Hemoglobin A1C  01/22/2016  . Mammogram  02/21/2016  . Flu Shot  04/28/2016  . Urine Protein Check  07/23/2016  . Eye exam for diabetics  11/11/2016  . Colon Cancer Screening  06/26/2018  . Tetanus Vaccine  08/01/2022  . Shingles Vaccine  Completed  . Pneumonia vaccines  Completed    Bone Densitometry Bone densitometry is an imaging test that uses a special X-ray to measure the amount of calcium and other minerals in your bones (bone density). This test is also known as a bone mineral density test or dual-energy X-ray absorptiometry (DXA). The test can measure bone density at your hip and your spine. It is similar to having a regular X-ray. You may have this test to:  Diagnose a condition that causes weak or thin bones (osteoporosis).  Predict your risk of a broken bone (fracture).  Determine how well osteoporosis treatment is working. LET [facility] CARE PROVIDER KNOW ABOUT:  Any allergies you have.  All medicines you are taking, including vitamins, herbs, eye drops, creams, and over-the-counter medicines.  Previous problems you or members of your family have had with the use of anesthetics.  Any blood disorders you have.  Previous surgeries you have had.  Medical conditions you have.  Possibility of pregnancy.  Any other medical test you had within the previous 14 days that used contrast material. RISKS AND COMPLICATIONS Generally, this is a safe procedure. However, problems can occur and may include the following:  This test exposes  you to a very small amount of radiation.  The risks of radiation exposure may be greater to unborn children. BEFORE THE PROCEDURE  Do not take any calcium supplements for 24 hours before having the test. You can otherwise eat and drink what you usually do.  Take off all metal jewelry, eyeglasses, dental appliances, and any other metal objects. PROCEDURE  You may lie on an exam table. There will be an X-ray generator below you and an imaging device above you.  Other devices, such as boxes or braces, may be used to position your body properly for the scan.  You will need to lie still while the machine slowly scans your body.  The images will show up on a computer monitor. AFTER THE PROCEDURE You may need more testing at a later time.   This information is not intended to replace advice given to you by your health care provider. Make sure you discuss any questions you have with your health care provider.   Document Released: 10/06/2004 Document Revised: 10/05/2014 Document Reviewed: 02/22/2014 Elsevier Interactive Patient Education [employer].

## 2015-12-09 NOTE — Progress Notes (Signed)
Subjective:   Jacqueline Mata is a 75 y.o. female who presents for Medicare Annual (Subsequent) preventive examination.  Review of Systems:  No ROS.  Medicare Wellness Visit.  Cardiac Risk Factors include: advanced age (>23mn, >>41women);diabetes mellitus     Objective:     Vitals: BP 118/70 mmHg  Pulse 77  Temp(Src) 98.1 F (36.7 C) (Oral)  Resp 14  Ht 5' 4.25" (1.632 m)  Wt 180 lb (81.647 kg)  BMI 30.65 kg/m2  SpO2 97%  Tobacco History  Smoking status  . Former Smoker -- 1.00 packs/day for 19 years  . Types: Cigarettes  . Start date: 01/02/1960  . Quit date: 07/22/1978  Smokeless tobacco  . Never Used    Comment: quit 36 years ago     Counseling given: Not Answered   Past Medical History  Diagnosis Date  . Diabetes mellitus   . Hyperlipidemia   . hypothyroidism   . breast cancer   . Cataract 01/04/13 and 03/01/13   Past Surgical History  Procedure Laterality Date  . Joint replacement  2013    by Dr. hMarry Guan  Family History  Problem Relation Age of Onset  . Cancer Mother 653   ovarian  . Cancer Sister     lung, former tobacco  . Cancer Brother 729   lung, thyroid, melanoma  . Diabetes Son    History  Sexual Activity  . Sexual Activity: No    Outpatient Encounter Prescriptions as of 12/09/2015  Medication Sig  . aspirin 81 MG tablet Take 81 mg by mouth daily.  .Marland Kitchenazelastine (ASTELIN) 0.1 % nasal spray Place 2 sprays into both nostrils 2 (two) times daily as needed for rhinitis. Use in each nostril as directed  . Blood Glucose Monitoring Suppl KIT by Does not apply route.  . Cholecalciferol (VITAMIN D3) 1000 UNITS CAPS Take 2 capsules by mouth daily.   .Marland Kitchenglucose blood (ACCU-CHEK AVIVA PLUS) test strip Use to check blood sugar once daily.  . Lancets (ACCU-CHEK MULTICLIX) lancets Use once daily to check blood sugars   Dx:E11.9  . levothyroxine (SYNTHROID, LEVOTHROID) 88 MCG tablet TAKE ONE TABLET BY MOUTH BEFORE BREAKFAST  . TURMERIC PO Take 1  tablet by mouth.  . fluocinonide cream (LIDEX) 04.28% Apply 1 application topically daily as needed. Reported on 12/09/2015  . fluticasone (FLONASE) 50 MCG/ACT nasal spray Place into both nostrils daily. Place 2 spriay into both nostrils daily  . folic acid (FOLVITE) 1 MG tablet Take 1 mg by mouth daily. Reported on 12/09/2015  . metroNIDAZOLE (METROGEL) 1 % gel Apply topically daily. Reported on 12/09/2015   No facility-administered encounter medications on file as of 12/09/2015.    Activities of Daily Living In your present state of health, do you have any difficulty performing the following activities: 12/09/2015  Hearing? Y  Vision? N  Difficulty concentrating or making decisions? N  Walking or climbing stairs? N  Dressing or bathing? N  Doing errands, shopping? N  Preparing Food and eating ? N  Using the Toilet? N  In the past six months, have you accidently leaked urine? Y  Do you have problems with loss of bowel control? N  Managing your Medications? N  Managing your Finances? N  Housekeeping or managing your Housekeeping? N    Patient Care Team: TCrecencio Mc MD as PCP - General (Internal Medicine)    Assessment:    This is a routine wellness examination for Jacqueline Mata The  goal of the wellness visit is to assist the patient how to close the gaps in care and create a preventative care plan for the patient.   Taking VIT D3 Calcium as appropriate/Osteoporosis risk reviewed.  DEXA Scan appointment scheduled.  Medications reviewed; taking without issues or barriers.  Safety issues reviewed; smoke detectors in the home. No firearms in the home. Wears seatbelts when driving or riding with others. No violence in the home.  No identified risk were noted; The patient was oriented x 3; appropriate in dress and manner and no objective failures at ADL's or IADL's.   Type 2 diabetes mell-stable and followed by PCP. DMII wo comp nt st uncntr-stable and followed by PCP.  Patient  Concerns:  None at this time.  Follow up with PCP as needed.  Exercise Activities and Dietary recommendations Current Exercise Habits: The patient does not participate in regular exercise at present  Goals    . Healthy Lifestyle     Stay hydrated!  Drink plenty of water. Low carb foods. Choose lean meats, fruits and vegetables.    . Increase physical activity     Begin stretching at home.  Start Silver Fit classes at the gym with a friend.      Fall Risk Fall Risk  12/09/2015 10/24/2015 04/03/2015 10/15/2014 10/03/2014  Falls in the past year? No No No No No   Depression Screen PHQ 2/9 Scores 12/09/2015 10/15/2014 02/08/2014 02/07/2014  PHQ - 2 Score 0 0 0 1     Cognitive Testing MMSE - Mini Mental State Exam 12/09/2015  Orientation to time 5  Orientation to Place 5  Registration 3  Attention/ Calculation 5  Recall 3  Language- name 2 objects 2  Language- repeat 1  Language- follow 3 step command 3  Language- read & follow direction 1  Write a sentence 1  Copy design 1  Total score 30    Immunization History  Administered Date(s) Administered  . Influenza,inj,Quad PF,36+ Mos 07/28/2013, 07/18/2014  . Influenza-Unspecified 07/04/2015  . Pneumococcal Conjugate-13 05/21/2014  . Pneumococcal Polysaccharide-23 09/29/2003, 02/12/2012  . Tdap 08/01/2012  . Zoster 09/28/2009   Screening Tests Health Maintenance  Topic Date Due  . DEXA SCAN  01/02/2007  . FOOT EXAM  12/19/2015  . HEMOGLOBIN A1C  01/22/2016  . MAMMOGRAM  02/21/2016  . INFLUENZA VACCINE  04/28/2016  . URINE MICROALBUMIN  07/23/2016  . OPHTHALMOLOGY EXAM  11/11/2016  . COLONOSCOPY  06/26/2018  . TETANUS/TDAP  08/01/2022  . ZOSTAVAX  Completed  . PNA vac Low Risk Adult  Completed      Plan:   End of life planning; Advance aging; Advanced directives discussed. Copy of current HCPOA/Living Will requested.   During the course of the visit the patient was educated and counseled about the following appropriate  screening and preventive services:   Vaccines to include Pneumoccal, Influenza, Hepatitis B, Td, Zostavax, HCV  Electrocardiogram  Cardiovascular Disease  Colorectal cancer screening  Bone density screening  Diabetes screening  Glaucoma screening  Mammography/PAP  Nutrition counseling   Patient Instructions (the written plan) was given to the patient.   Varney Biles, LPN  1/61/0960

## 2015-12-18 ENCOUNTER — Encounter: Payer: Self-pay | Admitting: Internal Medicine

## 2015-12-18 DIAGNOSIS — M5416 Radiculopathy, lumbar region: Secondary | ICD-10-CM | POA: Diagnosis not present

## 2015-12-18 DIAGNOSIS — M9903 Segmental and somatic dysfunction of lumbar region: Secondary | ICD-10-CM | POA: Diagnosis not present

## 2015-12-18 DIAGNOSIS — M5136 Other intervertebral disc degeneration, lumbar region: Secondary | ICD-10-CM | POA: Diagnosis not present

## 2015-12-18 DIAGNOSIS — M9905 Segmental and somatic dysfunction of pelvic region: Secondary | ICD-10-CM | POA: Diagnosis not present

## 2016-01-01 DIAGNOSIS — M5416 Radiculopathy, lumbar region: Secondary | ICD-10-CM | POA: Diagnosis not present

## 2016-01-01 DIAGNOSIS — M5136 Other intervertebral disc degeneration, lumbar region: Secondary | ICD-10-CM | POA: Diagnosis not present

## 2016-01-01 DIAGNOSIS — M9905 Segmental and somatic dysfunction of pelvic region: Secondary | ICD-10-CM | POA: Diagnosis not present

## 2016-01-01 DIAGNOSIS — M9903 Segmental and somatic dysfunction of lumbar region: Secondary | ICD-10-CM | POA: Diagnosis not present

## 2016-01-15 DIAGNOSIS — M5136 Other intervertebral disc degeneration, lumbar region: Secondary | ICD-10-CM | POA: Diagnosis not present

## 2016-01-15 DIAGNOSIS — M9903 Segmental and somatic dysfunction of lumbar region: Secondary | ICD-10-CM | POA: Diagnosis not present

## 2016-01-15 DIAGNOSIS — M9905 Segmental and somatic dysfunction of pelvic region: Secondary | ICD-10-CM | POA: Diagnosis not present

## 2016-01-15 DIAGNOSIS — M5416 Radiculopathy, lumbar region: Secondary | ICD-10-CM | POA: Diagnosis not present

## 2016-01-22 ENCOUNTER — Encounter: Payer: Self-pay | Admitting: Internal Medicine

## 2016-01-22 ENCOUNTER — Ambulatory Visit (INDEPENDENT_AMBULATORY_CARE_PROVIDER_SITE_OTHER): Payer: PPO | Admitting: Internal Medicine

## 2016-01-22 VITALS — BP 112/70 | HR 73 | Temp 98.1°F | Resp 12 | Ht 64.0 in | Wt 182.5 lb

## 2016-01-22 DIAGNOSIS — M25512 Pain in left shoulder: Secondary | ICD-10-CM

## 2016-01-22 DIAGNOSIS — E034 Atrophy of thyroid (acquired): Secondary | ICD-10-CM | POA: Diagnosis not present

## 2016-01-22 DIAGNOSIS — E663 Overweight: Secondary | ICD-10-CM

## 2016-01-22 DIAGNOSIS — E785 Hyperlipidemia, unspecified: Secondary | ICD-10-CM | POA: Diagnosis not present

## 2016-01-22 DIAGNOSIS — M25511 Pain in right shoulder: Secondary | ICD-10-CM

## 2016-01-22 DIAGNOSIS — E119 Type 2 diabetes mellitus without complications: Secondary | ICD-10-CM

## 2016-01-22 DIAGNOSIS — E038 Other specified hypothyroidism: Secondary | ICD-10-CM | POA: Diagnosis not present

## 2016-01-22 DIAGNOSIS — M5382 Other specified dorsopathies, cervical region: Secondary | ICD-10-CM

## 2016-01-22 LAB — COMPREHENSIVE METABOLIC PANEL
ALT: 24 U/L (ref 0–35)
AST: 24 U/L (ref 0–37)
Albumin: 4.2 g/dL (ref 3.5–5.2)
Alkaline Phosphatase: 61 U/L (ref 39–117)
BUN: 14 mg/dL (ref 6–23)
CALCIUM: 9.6 mg/dL (ref 8.4–10.5)
CHLORIDE: 106 meq/L (ref 96–112)
CO2: 22 meq/L (ref 19–32)
Creatinine, Ser: 0.83 mg/dL (ref 0.40–1.20)
GFR: 71.41 mL/min (ref >60.00)
Glucose, Bld: 153 mg/dL — ABNORMAL HIGH (ref 70–99)
POTASSIUM: 4.3 meq/L (ref 3.5–5.1)
Sodium: 137 mEq/L (ref 135–145)
Total Bilirubin: 0.7 mg/dL (ref 0.2–1.2)
Total Protein: 6.9 g/dL (ref 6.0–8.3)

## 2016-01-22 LAB — LDL CHOLESTEROL, DIRECT: LDL DIRECT: 142 mg/dL

## 2016-01-22 LAB — LIPID PANEL
CHOLESTEROL: 221 mg/dL — AB (ref 0–200)
HDL: 65.8 mg/dL (ref >39.00)
LDL Cholesterol: 141 mg/dL — ABNORMAL HIGH (ref 0–99)
NonHDL: 155.03
Total CHOL/HDL Ratio: 3
Triglycerides: 70 mg/dL (ref 0.0–149.0)
VLDL: 14 mg/dL (ref 0.0–40.0)

## 2016-01-22 LAB — MICROALBUMIN / CREATININE URINE RATIO
Creatinine,U: 98.1 mg/dL
MICROALB/CREAT RATIO: 0.7 mg/g (ref 0.0–30.0)
Microalb, Ur: <0.7 mg/dL (ref 0.0–1.9)

## 2016-01-22 LAB — HM DIABETES FOOT EXAM: HM Diabetic Foot Exam: NORMAL

## 2016-01-22 LAB — TSH: TSH: 0.66 u[IU]/mL (ref 0.35–4.50)

## 2016-01-22 LAB — HEMOGLOBIN A1C: Hgb A1c MFr Bld: 6.9 % — ABNORMAL HIGH (ref 4.6–6.5)

## 2016-01-22 NOTE — Progress Notes (Signed)
Pre-visit discussion using our clinic review tool. No additional management support is needed unless otherwise documented below in the visit note.  

## 2016-01-22 NOTE — Patient Instructions (Addendum)
If you're going to indulge in cookies,  You'll have to start exercising daily!!   tyr the Low Glycemic Index KIND Bars instead.  They havre more protein so they may fill you up longer   Referral to [facility] for neck muscle weakness    Try the Heritage Flakes cereal available at [facility] on the bottom shelf, with vanilla flavored almond milk  [employer] sells an organic almond mik, non refrigerated,  Called :"orgain"  That is fortified with protein and very low carb    To make a low carb chip :  Take the Joseph's Lavash or Pita bread,  Or the [place] Low carb whole wheat tortilla   Place on metal cookie sheet  Brush with olive oil  Sprinkle garlic powder (NOT garlic salt), grated parmesan cheese, mediterranean seasoning , or all of them?  Bake at 225 or 250 for 90 minutes   We have substitutions for your potatoes!!  Try the mashed cauliflower and riced cauliflower dishes by [employer]  instead of rice and mashed potatoes  Mashed turnips are also very low carb!   For dessert:  Try the [employer] Fit greek yogurt dessert flavors and top with reddi Whip .  8 carbs,  80 calories  Try [employer] Triple Zero [place] Yogurt in the salted caramel, and the coffee flavors  With Whipped Cream for dessert    You might want to try a premixed protein drink called Premier Protein shake in the morning.  It is less $$$ and very low sugar.    160 cal  30 g protein  1 g sugar 50% calcium needs    At the [employer] there are several good trainers:  Tollie Pizza (my trainer)  [employer]  Works with older clients)

## 2016-01-22 NOTE — Progress Notes (Signed)
Subjective:  Patient ID: Jacqueline Mata, female    DOB: April 24, 1941  Age: 75 y.o. MRN: [id number]  CC: The primary encounter diagnosis was Neck muscle weakness. Diagnoses of Diabetes mellitus type 2, diet-controlled ([place]), Hypothyroidism due to acquired atrophy of thyroid, Hyperlipidemia, Shoulder pain, bilateral, and Overweight (BMI 25.0-29.9) were also pertinent to this visit.  HPI Jacqueline Mata presents for 6 month follow up on type 2 DM with obesity diet controlled.  She feels generally well, is walking 3  times per week and checking blood sugars once daily at variable times.  BS have been under 150 fasting and < 160 post prandially.  Denies any recent hypoglyemic events.  Taking her medications as directed. Following a carbohydrate modified diet 6 days per week. Denies numbness, burning and tingling of extremities. Appetite is good.   Has been indulging in chocolate chip cookies nearly daily   Lab Results  Component Value Date   HGBA1C 6.9* 01/22/2016     Outpatient Prescriptions Prior to Visit  Medication Sig Dispense Refill  . aspirin 81 MG tablet Take 81 mg by mouth daily.    Marland Kitchen azelastine (ASTELIN) 0.1 % nasal spray Place 2 sprays into both nostrils 2 (two) times daily as needed for rhinitis. Use in each nostril as directed 30 mL 5  . Blood Glucose Monitoring Suppl KIT by Does not apply route.    . Cholecalciferol (VITAMIN D3) 1000 UNITS CAPS Take 2 capsules by mouth daily.     . fluocinonide cream (LIDEX) 9.62 % Apply 1 application topically daily as needed. Reported on 12/09/2015    . fluticasone (FLONASE) 50 MCG/ACT nasal spray Place into both nostrils daily. Place 2 spriay into both nostrils daily    . folic acid (FOLVITE) 1 MG tablet Take 1 mg by mouth daily. Reported on 12/09/2015    . glucose blood (ACCU-CHEK AVIVA PLUS) test strip Use to check blood sugar once daily. 100 each 12  . Lancets (ACCU-CHEK MULTICLIX) lancets Use once daily to check blood sugars   Dx:E11.9 100  each 3  . levothyroxine (SYNTHROID, LEVOTHROID) 88 MCG tablet TAKE ONE TABLET BY MOUTH BEFORE BREAKFAST 90 tablet 1  . metroNIDAZOLE (METROGEL) 1 % gel Apply topically daily. Reported on 12/09/2015  0  . TURMERIC PO Take 1 tablet by mouth. Reported on 01/22/2016     No facility-administered medications prior to visit.    Review of Systems;  Patient denies headache, fevers, malaise, unintentional weight loss, skin rash, eye pain, sinus congestion and sinus pain, sore throat, dysphagia,  hemoptysis , cough, dyspnea, wheezing, chest pain, palpitations, orthopnea, edema, abdominal pain, nausea, melena, diarrhea, constipation, flank pain, dysuria, hematuria, urinary  Frequency, nocturia, numbness, tingling, seizures,  Focal weakness, Loss of consciousness,  Tremor, insomnia, depression, anxiety, and suicidal ideation.      Objective:  BP 112/70 mmHg  Pulse 73  Temp(Src) 98.1 F (36.7 C) (Oral)  Resp 12  Ht '5\' 4"'$  (1.626 m)  Wt 182 lb 8 oz (82.781 kg)  BMI 31.31 kg/m2  SpO2 95%  BP Readings from Last 3 Encounters:  01/22/16 112/70  12/09/15 118/70  10/24/15 114/85    Wt Readings from Last 3 Encounters:  01/22/16 182 lb 8 oz (82.781 kg)  12/09/15 180 lb (81.647 kg)  10/24/15 178 lb (80.74 kg)    General appearance: alert, cooperative and appears stated age Ears: normal TM's and external ear canals both ears Throat: lips, mucosa, and tongue normal; teeth and gums normal Neck: no  adenopathy, no carotid bruit, supple, symmetrical, trachea midline and thyroid not enlarged, symmetric, no tenderness/mass/nodules Back: symmetric, no curvature. ROM normal. No CVA tenderness. Lungs: clear to auscultation bilaterally Heart: regular rate and rhythm, S1, S2 normal, no murmur, click, rub or gallop Abdomen: soft, non-tender; bowel sounds normal; no masses,  no organomegaly Pulses: 2+ and symmetric Skin: Skin color, texture, turgor normal. No rashes or lesions Lymph nodes: Cervical,  supraclavicular, and axillary nodes normal.  Lab Results  Component Value Date   HGBA1C 6.9* 01/22/2016   HGBA1C 6.6* 07/24/2015   HGBA1C 6.6* 01/17/2015    Lab Results  Component Value Date   CREATININE 0.83 01/22/2016   CREATININE 0.9 10/24/2015   CREATININE 0.85 07/24/2015    Lab Results  Component Value Date   WBC 7.2 10/24/2015   HGB 14.8 10/24/2015   HCT 42.2 10/24/2015   PLT 174 10/24/2015   GLUCOSE 153* 01/22/2016   CHOL 221* 01/22/2016   TRIG 70.0 01/22/2016   HDL 65.80 01/22/2016   LDLDIRECT 142.0 01/22/2016   LDLCALC 141* 01/22/2016   ALT 24 01/22/2016   AST 24 01/22/2016   NA 137 01/22/2016   K 4.3 01/22/2016   CL 106 01/22/2016   CREATININE 0.83 01/22/2016   BUN 14 01/22/2016   CO2 22 01/22/2016   TSH 0.66 01/22/2016   INR 1.0 03/14/2012   HGBA1C 6.9* 01/22/2016   MICROALBUR <0.7 01/22/2016    Mr Cervical Spine Wo Contrast  08/05/2015  CLINICAL DATA:  Cervical spondylosis with myelopathy. Right arm pain EXAM: MRI CERVICAL SPINE WITHOUT CONTRAST TECHNIQUE: Multiplanar, multisequence MR imaging of the cervical spine was performed. No intravenous contrast was administered. COMPARISON:  None. FINDINGS: Normal cervical alignment. Mild anterior slip T1-2 and T2-3 consistent with disc and facet degeneration. Negative for fracture or mass. No bone marrow edema. Generous sized spinal canal. No cord compression. Spinal cord signal normal without cord lesion. Cervical medullary junction normal. C2-3:  Negative C3-4: Mild disc and mild facet degeneration without spinal or foraminal stenosis. C4-5: Mild disc degeneration and mild uncinate spurring. Bilateral facet hypertrophy. Mild foraminal narrowing bilaterally. No spinal stenosis C5-6: Mild disc degeneration with uncinate spurring. Mild facet hypertrophy bilaterally. Mild foraminal narrowing bilaterally. C6-7: Moderate disc degeneration and spondylosis. Mild foraminal narrowing on the left due to uncinate spurring.  C7-T1: Negative IMPRESSION: Mild cervical disc and facet degeneration at multiple levels. Mild foraminal narrowing bilaterally due to bony overgrowth. No focal disc protrusion. The patient has a generous sized spinal canal and there is no spinal stenosis or cord lesion. Electronically Signed   By: Franchot Gallo M.D.   On: 08/05/2015 10:12    Assessment & Plan:   Problem List Items Addressed This Visit    Diabetes mellitus type 2, diet-controlled ([place])    Historically well-controlled on current medications.  hemoglobin A1c has been consistently at or  less than 7.0 . Patient is up-to-date on eye exams and foot exam is normal today. Patient is due for urine microalbumin to creatinine ratio at next visit. Patient is tolerating statin therapy for CAD risk reduction and on ACE/ARB for reduction in proteinuria.  Current labs are pending   Lab Results  Component Value Date   HGBA1C 6.9* 01/22/2016   Lab Results  Component Value Date   MICROALBUR <0.7 01/22/2016           Relevant Orders   Comprehensive metabolic panel (Completed)   Hemoglobin A1c (Completed)   Lipid panel (Completed)   Microalbumin / creatinine urine  ratio (Completed)   Hypothyroidism    Thyroid function is WNL on current dose.  No current changes needed.   Lab Results  Component Value Date   TSH 0.66 01/22/2016         Relevant Orders   TSH (Completed)   Overweight (BMI 25.0-29.9)    I have addressed  BMI and recommended a low glycemic index diet utilizing smaller more frequent meals to increase metabolism.  I have also recommended that patient start exercising with a goal of 30 minutes of aerobic exercise a minimum of 5 days per week. Screening for lipid disorders, thyroid and diabetes to be done today.        Shoulder pain, bilateral    Referral to [facility] for neck pain and shoulder weakness.       Hyperlipidemia   Relevant Orders   LDL cholesterol, direct (Completed)    Other Visit Diagnoses      Neck muscle weakness    -  Primary    Relevant Orders    Ambulatory referral to Physical Therapy      A total of 25 minutes of face to face time was spent with patient more than half of which was spent in counselling about the above mentioned conditions  and coordination of care   I am having Ms. Fujita maintain her aspirin, Blood Glucose Monitoring Suppl, fluocinonide cream, fluticasone, folic acid, Vitamin D3, azelastine, metroNIDAZOLE, glucose blood, accu-chek multiclix, levothyroxine, and TURMERIC PO.  No orders of the defined types were placed in this encounter.    There are no discontinued medications.  Follow-up: No Follow-up on file.   [name], MD

## 2016-01-25 NOTE — Assessment & Plan Note (Signed)
I have addressed  BMI and recommended a low glycemic index diet utilizing smaller more frequent meals to increase metabolism.  I have also recommended that patient start exercising with a goal of 30 minutes of aerobic exercise a minimum of 5 days per week. Screening for lipid disorders, thyroid and diabetes to be done today.   

## 2016-01-25 NOTE — Assessment & Plan Note (Signed)
Thyroid function is WNL on current dose.  No current changes needed.   Lab Results  Component Value Date   TSH 0.66 01/22/2016

## 2016-01-25 NOTE — Assessment & Plan Note (Signed)
Historically well-controlled on current medications.  hemoglobin A1c has been consistently at or  less than 7.0 . Patient is up-to-date on eye exams and foot exam is normal today. Patient is due for urine microalbumin to creatinine ratio at next visit. Patient is tolerating statin therapy for CAD risk reduction and on ACE/ARB for reduction in proteinuria.  Current labs are pending   Lab Results  Component Value Date   HGBA1C 6.9* 01/22/2016   Lab Results  Component Value Date   MICROALBUR <0.7 01/22/2016

## 2016-01-25 NOTE — Assessment & Plan Note (Signed)
Referral to [facility] for neck pain and shoulder weakness.

## 2016-01-26 ENCOUNTER — Telehealth: Payer: Self-pay | Admitting: Internal Medicine

## 2016-01-26 NOTE — Telephone Encounter (Signed)
My Chart message sent

## 2016-01-29 DIAGNOSIS — M9905 Segmental and somatic dysfunction of pelvic region: Secondary | ICD-10-CM | POA: Diagnosis not present

## 2016-01-29 DIAGNOSIS — M5416 Radiculopathy, lumbar region: Secondary | ICD-10-CM | POA: Diagnosis not present

## 2016-01-29 DIAGNOSIS — M5136 Other intervertebral disc degeneration, lumbar region: Secondary | ICD-10-CM | POA: Diagnosis not present

## 2016-01-29 DIAGNOSIS — M9903 Segmental and somatic dysfunction of lumbar region: Secondary | ICD-10-CM | POA: Diagnosis not present

## 2016-02-03 DIAGNOSIS — M542 Cervicalgia: Secondary | ICD-10-CM | POA: Diagnosis not present

## 2016-02-05 DIAGNOSIS — M542 Cervicalgia: Secondary | ICD-10-CM | POA: Diagnosis not present

## 2016-02-10 DIAGNOSIS — M542 Cervicalgia: Secondary | ICD-10-CM | POA: Diagnosis not present

## 2016-02-12 DIAGNOSIS — M5416 Radiculopathy, lumbar region: Secondary | ICD-10-CM | POA: Diagnosis not present

## 2016-02-12 DIAGNOSIS — M9903 Segmental and somatic dysfunction of lumbar region: Secondary | ICD-10-CM | POA: Diagnosis not present

## 2016-02-12 DIAGNOSIS — M5136 Other intervertebral disc degeneration, lumbar region: Secondary | ICD-10-CM | POA: Diagnosis not present

## 2016-02-12 DIAGNOSIS — M9905 Segmental and somatic dysfunction of pelvic region: Secondary | ICD-10-CM | POA: Diagnosis not present

## 2016-02-13 DIAGNOSIS — M542 Cervicalgia: Secondary | ICD-10-CM | POA: Diagnosis not present

## 2016-02-17 DIAGNOSIS — M542 Cervicalgia: Secondary | ICD-10-CM | POA: Diagnosis not present

## 2016-02-20 DIAGNOSIS — M542 Cervicalgia: Secondary | ICD-10-CM | POA: Diagnosis not present

## 2016-02-22 ENCOUNTER — Other Ambulatory Visit: Payer: Self-pay | Admitting: Internal Medicine

## 2016-02-24 DIAGNOSIS — M542 Cervicalgia: Secondary | ICD-10-CM | POA: Diagnosis not present

## 2016-02-25 DIAGNOSIS — Z780 Asymptomatic menopausal state: Secondary | ICD-10-CM | POA: Diagnosis not present

## 2016-02-25 DIAGNOSIS — Z853 Personal history of malignant neoplasm of breast: Secondary | ICD-10-CM | POA: Diagnosis not present

## 2016-02-25 DIAGNOSIS — Z1231 Encounter for screening mammogram for malignant neoplasm of breast: Secondary | ICD-10-CM | POA: Diagnosis not present

## 2016-02-25 DIAGNOSIS — M8589 Other specified disorders of bone density and structure, multiple sites: Secondary | ICD-10-CM | POA: Diagnosis not present

## 2016-02-25 LAB — HM MAMMOGRAPHY

## 2016-02-26 DIAGNOSIS — M9905 Segmental and somatic dysfunction of pelvic region: Secondary | ICD-10-CM | POA: Diagnosis not present

## 2016-02-26 DIAGNOSIS — M9903 Segmental and somatic dysfunction of lumbar region: Secondary | ICD-10-CM | POA: Diagnosis not present

## 2016-02-26 DIAGNOSIS — M5136 Other intervertebral disc degeneration, lumbar region: Secondary | ICD-10-CM | POA: Diagnosis not present

## 2016-02-26 DIAGNOSIS — M5416 Radiculopathy, lumbar region: Secondary | ICD-10-CM | POA: Diagnosis not present

## 2016-02-27 ENCOUNTER — Encounter: Payer: Self-pay | Admitting: Hematology & Oncology

## 2016-02-27 DIAGNOSIS — M542 Cervicalgia: Secondary | ICD-10-CM | POA: Diagnosis not present

## 2016-03-02 DIAGNOSIS — M542 Cervicalgia: Secondary | ICD-10-CM | POA: Diagnosis not present

## 2016-03-03 ENCOUNTER — Encounter: Payer: Self-pay | Admitting: *Deleted

## 2016-03-04 ENCOUNTER — Encounter: Payer: Self-pay | Admitting: Internal Medicine

## 2016-03-04 ENCOUNTER — Telehealth: Payer: Self-pay | Admitting: Internal Medicine

## 2016-03-04 NOTE — Telephone Encounter (Signed)
MyChart message sent re results of DEXA   .regards,  TT

## 2016-03-05 DIAGNOSIS — M542 Cervicalgia: Secondary | ICD-10-CM | POA: Diagnosis not present

## 2016-03-09 ENCOUNTER — Encounter: Payer: Self-pay | Admitting: Internal Medicine

## 2016-03-09 DIAGNOSIS — M542 Cervicalgia: Secondary | ICD-10-CM | POA: Diagnosis not present

## 2016-03-10 NOTE — Telephone Encounter (Signed)
Patient cannot get in at dermatology until July 12 Scheduled patient for next Thursday at 11.15 is that ok before I notify patient.

## 2016-03-12 ENCOUNTER — Encounter: Payer: Self-pay | Admitting: Internal Medicine

## 2016-03-13 DIAGNOSIS — M542 Cervicalgia: Secondary | ICD-10-CM | POA: Diagnosis not present

## 2016-03-16 DIAGNOSIS — M542 Cervicalgia: Secondary | ICD-10-CM | POA: Diagnosis not present

## 2016-03-18 ENCOUNTER — Ambulatory Visit: Payer: PPO | Admitting: Internal Medicine

## 2016-03-18 DIAGNOSIS — M5416 Radiculopathy, lumbar region: Secondary | ICD-10-CM | POA: Diagnosis not present

## 2016-03-18 DIAGNOSIS — M9905 Segmental and somatic dysfunction of pelvic region: Secondary | ICD-10-CM | POA: Diagnosis not present

## 2016-03-18 DIAGNOSIS — M9903 Segmental and somatic dysfunction of lumbar region: Secondary | ICD-10-CM | POA: Diagnosis not present

## 2016-03-18 DIAGNOSIS — M5136 Other intervertebral disc degeneration, lumbar region: Secondary | ICD-10-CM | POA: Diagnosis not present

## 2016-03-19 ENCOUNTER — Ambulatory Visit (INDEPENDENT_AMBULATORY_CARE_PROVIDER_SITE_OTHER): Payer: PPO | Admitting: Internal Medicine

## 2016-03-19 ENCOUNTER — Encounter: Payer: Self-pay | Admitting: Internal Medicine

## 2016-03-19 VITALS — BP 104/64 | HR 82 | Temp 97.6°F | Resp 12 | Ht 65.0 in | Wt 178.5 lb

## 2016-03-19 DIAGNOSIS — E785 Hyperlipidemia, unspecified: Secondary | ICD-10-CM | POA: Diagnosis not present

## 2016-03-19 DIAGNOSIS — E119 Type 2 diabetes mellitus without complications: Secondary | ICD-10-CM | POA: Diagnosis not present

## 2016-03-19 DIAGNOSIS — B351 Tinea unguium: Secondary | ICD-10-CM | POA: Insufficient documentation

## 2016-03-19 LAB — COMPREHENSIVE METABOLIC PANEL
ALBUMIN: 4.2 g/dL (ref 3.5–5.2)
ALT: 22 U/L (ref 0–35)
AST: 18 U/L (ref 0–37)
Alkaline Phosphatase: 58 U/L (ref 39–117)
BUN: 16 mg/dL (ref 6–23)
CALCIUM: 9.9 mg/dL (ref 8.4–10.5)
CHLORIDE: 105 meq/L (ref 96–112)
CO2: 29 mEq/L (ref 19–32)
CREATININE: 0.97 mg/dL (ref 0.40–1.20)
GFR: 59.63 mL/min — AB (ref >60.00)
Glucose, Bld: 102 mg/dL — ABNORMAL HIGH (ref 70–99)
POTASSIUM: 3.9 meq/L (ref 3.5–5.1)
Sodium: 139 mEq/L (ref 135–145)
Total Bilirubin: 0.8 mg/dL (ref 0.2–1.2)
Total Protein: 6.5 g/dL (ref 6.0–8.3)

## 2016-03-19 LAB — LIPID PANEL
Cholesterol: 236 mg/dL — ABNORMAL HIGH (ref 0–200)
HDL: 69.6 mg/dL (ref >39.00)
LDL Cholesterol: 138 mg/dL — ABNORMAL HIGH (ref 0–99)
NONHDL: 166.14
Total CHOL/HDL Ratio: 3
Triglycerides: 141 mg/dL (ref 0.0–149.0)
VLDL: 28.2 mg/dL (ref 0.0–40.0)

## 2016-03-19 LAB — MICROALBUMIN / CREATININE URINE RATIO
Creatinine,U: 134.2 mg/dL
MICROALB/CREAT RATIO: 0.6 mg/g (ref 0.0–30.0)
Microalb, Ur: 0.8 mg/dL (ref 0.0–1.9)

## 2016-03-19 LAB — HEMOGLOBIN A1C: HEMOGLOBIN A1C: 6.5 % (ref 4.6–6.5)

## 2016-03-19 LAB — TSH: TSH: 0.14 u[IU]/mL — AB (ref 0.35–4.50)

## 2016-03-19 LAB — LDL CHOLESTEROL, DIRECT: LDL DIRECT: 144 mg/dL

## 2016-03-19 MED ORDER — TERBINAFINE HCL 250 MG PO TABS: 250.0000 mg | ORAL_TABLET | Freq: Every day | ORAL | Status: DC

## 2016-03-19 NOTE — Progress Notes (Signed)
Pre-visit discussion using our clinic review tool. No additional management support is needed unless otherwise documented below in the visit note.  

## 2016-03-19 NOTE — Patient Instructions (Addendum)
We will do labs on August,  Keep your appointment  On August  3rd   treatment for fungus of the nails  will be terbinafine,    Take it once daily for 12 weeks     Liver enzymes today and repeat labs in late July early august   Nail Ringworm A fungal infection of the nail (tinea unguium/onychomycosis) is common. It is common as the visible part of the nail is composed of dead cells which have no blood supply to help prevent infection. It occurs because fungi are everywhere and will pick any opportunity to grow on any dead material. Because nails are very slow growing they require up to 2 years of treatment with anti-fungal medications. The entire nail back to the base is infected. This includes approximately  of the nail which you cannot see. If your caregiver has prescribed a medication by mouth, take it every day and as directed. No progress will be seen for at least 6 to 9 months. Do not be disappointed! Because fungi live on dead cells with little or no exposure to blood supply, medication delivery to the infection is slow; thus the cure is slow. It is also why you can observe no progress in the first 6 months. The nail becoming cured is the base of the nail, as it has the blood supply. Topical medication such as creams and ointments are usually not effective. Important in successful treatment of nail fungus is closely following the medication regimen that your doctor prescribes. Sometimes you and your caregiver may elect to speed up this process by surgical removal of all the nails. Even this may still require 6 to 9 months of additional oral medications. See your caregiver as directed. Remember there will be no visible improvement for at least 6 months. See your caregiver sooner if other signs of infection (redness and swelling) develop.   This information is not intended to replace advice given to you by your health care provider. Make sure you discuss any questions you have with your health  care provider.   Document Released: 09/11/2000 Document Revised: 01/29/2015 Document Reviewed: 03/18/2015 Elsevier Interactive Patient Education [employer].

## 2016-03-20 ENCOUNTER — Other Ambulatory Visit: Payer: Self-pay | Admitting: Internal Medicine

## 2016-03-20 DIAGNOSIS — M542 Cervicalgia: Secondary | ICD-10-CM | POA: Diagnosis not present

## 2016-03-20 MED ORDER — TERBINAFINE HCL 250 MG PO TABS: 250.0000 mg | ORAL_TABLET | Freq: Every day | ORAL | Status: DC

## 2016-03-20 NOTE — Telephone Encounter (Signed)
One a day for 12 weeks patient needs 78 more pills correct she only received 12.?

## 2016-03-21 NOTE — Assessment & Plan Note (Addendum)
Of fingernails and toe nails.  oral terbinafine 250 mg daily x 12 weeks, prescribed.  LFT today and every 6 weeks patient not on a statin .  Lab Results  Component Value Date   ALT 22 03/19/2016   AST 18 03/19/2016   ALKPHOS 58 03/19/2016   BILITOT 0.8 03/19/2016

## 2016-03-21 NOTE — Progress Notes (Signed)
Subjective:  Patient ID: Jacqueline Mata, female    DOB: 09-25-1941  Age: 75 y.o. MRN: [id number]  CC: The primary encounter diagnosis was Onychomycosis. Diagnoses of Diabetes mellitus without complication ([place]) and Hyperlipidemia were also pertinent to this visit.  HPI [name] presents for evaluation of fingernails and toenails.  Patient was recently noted durig a mani/pedicur eto have findings worrisome for onychomycosis. Specifically,  She has noticed a lifting of the nails from the nail bed on several fingers bilaterally, accompanied by thickening of the nails with debris under the nail   Outpatient Prescriptions Prior to Visit  Medication Sig Dispense Refill  . aspirin 81 MG tablet Take 81 mg by mouth daily.    Marland Kitchen azelastine (ASTELIN) 0.1 % nasal spray Place 2 sprays into both nostrils 2 (two) times daily as needed for rhinitis. Use in each nostril as directed 30 mL 5  . Blood Glucose Monitoring Suppl KIT by Does not apply route.    . Cholecalciferol (VITAMIN D3) 1000 UNITS CAPS Take 2 capsules by mouth daily.     . fluocinonide cream (LIDEX) 5.17 % Apply 1 application topically daily as needed. Reported on 12/09/2015    . fluticasone (FLONASE) 50 MCG/ACT nasal spray Place into both nostrils daily. Place 2 spriay into both nostrils daily    . folic acid (FOLVITE) 1 MG tablet Take 1 mg by mouth daily. Reported on 12/09/2015    . glucose blood (ACCU-CHEK AVIVA PLUS) test strip Use to check blood sugar once daily. 100 each 12  . Lancets (ACCU-CHEK MULTICLIX) lancets Use once daily to check blood sugars   Dx:E11.9 100 each 3  . levothyroxine (SYNTHROID, LEVOTHROID) 88 MCG tablet TAKE 1 TABLET BY MOUTH EVERY DAY BEFORE BREAKFAST 90 tablet 3  . metroNIDAZOLE (METROGEL) 1 % gel Apply topically daily. Reported on 12/09/2015  0  . TURMERIC PO Take 1 tablet by mouth. Reported on 03/19/2016     No facility-administered medications prior to visit.    Review of Systems;  Patient denies  headache, fevers, malaise, unintentional weight loss, skin rash, eye pain, sinus congestion and sinus pain, sore throat, dysphagia,  hemoptysis , cough, dyspnea, wheezing, chest pain, palpitations, orthopnea, edema, abdominal pain, nausea, melena, diarrhea, constipation, flank pain, dysuria, hematuria, urinary  Frequency, nocturia, numbness, tingling, seizures,  Focal weakness, Loss of consciousness,  Tremor, insomnia, depression, anxiety, and suicidal ideation.      Objective:  BP 104/64 mmHg  Pulse 82  Temp(Src) 97.6 F (36.4 C) (Oral)  Resp 12  Ht '5\' 5"'$  (1.651 m)  Wt 178 lb 8 oz (80.967 kg)  BMI 29.70 kg/m2  SpO2 96%  BP Readings from Last 3 Encounters:  03/19/16 104/64  01/22/16 112/70  12/09/15 118/70    Wt Readings from Last 3 Encounters:  03/19/16 178 lb 8 oz (80.967 kg)  01/22/16 182 lb 8 oz (82.781 kg)  12/09/15 180 lb (81.647 kg)    General appearance: alert, cooperative and appears stated age Neck: no adenopathy, no carotid bruit, supple, symmetrical, trachea midline and thyroid not enlarged, symmetric, no tenderness/mass/nodules Pulses: 2+ and symmetric Skin: Skin color, texture, turgor normal. No rashes or lesions Naidls: onycholysis and thickening noted bilaterally Lymph nodes: Cervical, supraclavicular, and axillary nodes normal.  Lab Results  Component Value Date   HGBA1C 6.5 03/19/2016   HGBA1C 6.9* 01/22/2016   HGBA1C 6.6* 07/24/2015    Lab Results  Component Value Date   CREATININE 0.97 03/19/2016   CREATININE 0.83 01/22/2016  CREATININE 0.9 10/24/2015    Lab Results  Component Value Date   WBC 7.2 10/24/2015   HGB 14.8 10/24/2015   HCT 42.2 10/24/2015   PLT 174 10/24/2015   GLUCOSE 102* 03/19/2016   CHOL 236* 03/19/2016   TRIG 141.0 03/19/2016   HDL 69.60 03/19/2016   LDLDIRECT 144.0 03/19/2016   LDLCALC 138* 03/19/2016   ALT 22 03/19/2016   AST 18 03/19/2016   NA 139 03/19/2016   K 3.9 03/19/2016   CL 105 03/19/2016   CREATININE  0.97 03/19/2016   BUN 16 03/19/2016   CO2 29 03/19/2016   TSH 0.14* 03/19/2016   INR 1.0 03/14/2012   HGBA1C 6.5 03/19/2016   MICROALBUR 0.8 03/19/2016    Mr Cervical Spine Wo Contrast  08/05/2015  CLINICAL DATA:  Cervical spondylosis with myelopathy. Right arm pain EXAM: MRI CERVICAL SPINE WITHOUT CONTRAST TECHNIQUE: Multiplanar, multisequence MR imaging of the cervical spine was performed. No intravenous contrast was administered. COMPARISON:  None. FINDINGS: Normal cervical alignment. Mild anterior slip T1-2 and T2-3 consistent with disc and facet degeneration. Negative for fracture or mass. No bone marrow edema. Generous sized spinal canal. No cord compression. Spinal cord signal normal without cord lesion. Cervical medullary junction normal. C2-3:  Negative C3-4: Mild disc and mild facet degeneration without spinal or foraminal stenosis. C4-5: Mild disc degeneration and mild uncinate spurring. Bilateral facet hypertrophy. Mild foraminal narrowing bilaterally. No spinal stenosis C5-6: Mild disc degeneration with uncinate spurring. Mild facet hypertrophy bilaterally. Mild foraminal narrowing bilaterally. C6-7: Moderate disc degeneration and spondylosis. Mild foraminal narrowing on the left due to uncinate spurring. C7-T1: Negative IMPRESSION: Mild cervical disc and facet degeneration at multiple levels. Mild foraminal narrowing bilaterally due to bony overgrowth. No focal disc protrusion. The patient has a generous sized spinal canal and there is no spinal stenosis or cord lesion. Electronically Signed   By: Franchot Gallo M.D.   On: 08/05/2015 10:12    Assessment & Plan:   Problem List Items Addressed This Visit    Onychomycosis - Primary    Of fingernails and toe nails.  oral terbinafine 250 mg daily x 12 weeks, prescribed.  LFT today and every 6 weeks patient not on a statin .  Lab Results  Component Value Date   ALT 22 03/19/2016   AST 18 03/19/2016   ALKPHOS 58 03/19/2016   BILITOT  0.8 03/19/2016         Hyperlipidemia   Relevant Orders   LDL cholesterol, direct (Completed)    Other Visit Diagnoses    Diabetes mellitus without complication ([place])        Relevant Orders    Comprehensive metabolic panel (Completed)    Hemoglobin A1c (Completed)    Lipid panel (Completed)    Microalbumin / creatinine urine ratio (Completed)    TSH (Completed)       I am having Ms. Schaafsma maintain her aspirin, Blood Glucose Monitoring Suppl, fluocinonide cream, fluticasone, folic acid, Vitamin D3, azelastine, metroNIDAZOLE, glucose blood, accu-chek multiclix, TURMERIC PO, levothyroxine, and Biotin.  Meds ordered this encounter  Medications  . Biotin 5000 MCG CAPS    Sig: Take 1 capsule by mouth daily.  Marland Kitchen DISCONTD: terbinafine (LAMISIL) 250 MG tablet    Sig: Take 1 tablet (250 mg total) by mouth daily.    Dispense:  12 tablet    Refill:  0   A total of 25 minutes of face to face time was spent with patient more than half of which was  spent in counselling about the above mentioned conditions  and coordination of care   There are no discontinued medications.  Follow-up: Return for fasting labs on or after July 27.   [name], MD

## 2016-03-22 ENCOUNTER — Encounter: Payer: Self-pay | Admitting: Internal Medicine

## 2016-03-24 ENCOUNTER — Encounter: Payer: Self-pay | Admitting: Internal Medicine

## 2016-03-25 ENCOUNTER — Other Ambulatory Visit: Payer: Self-pay | Admitting: Internal Medicine

## 2016-03-25 DIAGNOSIS — E785 Hyperlipidemia, unspecified: Secondary | ICD-10-CM

## 2016-03-25 DIAGNOSIS — E039 Hypothyroidism, unspecified: Secondary | ICD-10-CM

## 2016-03-25 MED ORDER — LEVOTHYROXINE SODIUM 75 MCG PO TABS: 75.0000 ug | ORAL_TABLET | Freq: Every day | ORAL | Status: DC

## 2016-04-08 DIAGNOSIS — M9905 Segmental and somatic dysfunction of pelvic region: Secondary | ICD-10-CM | POA: Diagnosis not present

## 2016-04-08 DIAGNOSIS — M9903 Segmental and somatic dysfunction of lumbar region: Secondary | ICD-10-CM | POA: Diagnosis not present

## 2016-04-08 DIAGNOSIS — L218 Other seborrheic dermatitis: Secondary | ICD-10-CM | POA: Diagnosis not present

## 2016-04-08 DIAGNOSIS — M5416 Radiculopathy, lumbar region: Secondary | ICD-10-CM | POA: Diagnosis not present

## 2016-04-08 DIAGNOSIS — L718 Other rosacea: Secondary | ICD-10-CM | POA: Diagnosis not present

## 2016-04-08 DIAGNOSIS — Z85828 Personal history of other malignant neoplasm of skin: Secondary | ICD-10-CM | POA: Diagnosis not present

## 2016-04-08 DIAGNOSIS — L821 Other seborrheic keratosis: Secondary | ICD-10-CM | POA: Diagnosis not present

## 2016-04-08 DIAGNOSIS — M5136 Other intervertebral disc degeneration, lumbar region: Secondary | ICD-10-CM | POA: Diagnosis not present

## 2016-04-16 ENCOUNTER — Encounter: Payer: Self-pay | Admitting: Internal Medicine

## 2016-04-16 DIAGNOSIS — L309 Dermatitis, unspecified: Secondary | ICD-10-CM | POA: Diagnosis not present

## 2016-04-16 DIAGNOSIS — L989 Disorder of the skin and subcutaneous tissue, unspecified: Secondary | ICD-10-CM | POA: Diagnosis not present

## 2016-04-17 ENCOUNTER — Telehealth: Payer: Self-pay | Admitting: Internal Medicine

## 2016-04-17 NOTE — Telephone Encounter (Signed)
Did you get my request to work patient in next week for evaluation of drug rash? It may hae been linked to the wrong My chart messaget

## 2016-04-17 NOTE — Telephone Encounter (Signed)
Yes I figured it out and scheduled her for Tuesday night at 430pm

## 2016-04-21 ENCOUNTER — Other Ambulatory Visit: Payer: Self-pay

## 2016-04-21 ENCOUNTER — Encounter: Payer: Self-pay | Admitting: Internal Medicine

## 2016-04-21 ENCOUNTER — Ambulatory Visit (INDEPENDENT_AMBULATORY_CARE_PROVIDER_SITE_OTHER): Payer: PPO | Admitting: Internal Medicine

## 2016-04-21 DIAGNOSIS — L270 Generalized skin eruption due to drugs and medicaments taken internally: Secondary | ICD-10-CM | POA: Diagnosis not present

## 2016-04-21 DIAGNOSIS — B372 Candidiasis of skin and nail: Secondary | ICD-10-CM

## 2016-04-21 MED ORDER — NYSTATIN 100000 UNIT/GM EX POWD: Freq: Two times a day (BID) | CUTANEOUS | 3 refills | Status: DC

## 2016-04-21 NOTE — Patient Instructions (Addendum)
You can use  Gold  Bond medicated Powder with zinc in your inguinal folds and under breasts and underarms  to prevent yeast from coming back  If it does,. Use the nystatin powder I am prescribing today twice daily until the redness resolves  See you in September for diabetes follow up (labs due after Sept 22)   Cutaneous Candidiasis Cutaneous candidiasis is a condition in which there is an overgrowth of yeast (candida) on the skin. Yeast normally live on the skin, but in small enough numbers not to cause any symptoms. In certain cases, increased growth of the yeast may cause an actual yeast infection. This kind of infection usually occurs in areas of the skin that are constantly warm and moist, such as the armpits or the groin. Yeast is the most common cause of diaper rash in babies and in people who cannot control their bowel movements (incontinence). CAUSES  The fungus that most often causes cutaneous candidiasis is Candida albicans. Conditions that can increase the risk of getting a yeast infection of the skin include:  Obesity.  Pregnancy.  Diabetes.  Taking antibiotic medicine.  Taking birth control pills.  Taking steroid medicines.  Thyroid disease.  An iron or zinc deficiency.  Problems with the immune system. SYMPTOMS   Red, swollen area of the skin.  Bumps on the skin.  Itchiness. DIAGNOSIS  The diagnosis of cutaneous candidiasis is usually based on its appearance. Light scrapings of the skin may also be taken and viewed under a microscope to identify the presence of yeast. TREATMENT  Antifungal creams may be applied to the infected skin. In severe cases, oral medicines may be needed.  HOME CARE INSTRUCTIONS   Keep your skin clean and dry.  Maintain a healthy weight.  If you have diabetes, keep your blood sugar under control. SEEK IMMEDIATE MEDICAL CARE IF:  Your rash continues to spread despite treatment.  You have a fever, chills, or abdominal pain.    This information is not intended to replace advice given to you by your health care provider. Make sure you discuss any questions you have with your health care provider.   Document Released: 06/02/2011 Document Revised: 12/07/2011 Document Reviewed: 03/18/2015 Elsevier Interactive Patient Education [employer].

## 2016-04-21 NOTE — Progress Notes (Signed)
Subjective:  Patient ID: Jacqueline Mata, female    DOB: [birth year]  Age: 75 y.o. MRN: [id number]  CC: Diagnoses of Candidiasis of skin and Drug-induced skin rash were pertinent to this visit.  HPI Jacqueline Mata presents for evaluation of rash that started after taking terbinafine for treatmentof onychomycosis of fingernails.  She was seen by Dermatology and apparently has tow different rashes,  One on her arms and trunk that was deemed to be  A drug reaction,  And one in her inguinal and axillary folds.      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 Her dermatologist;s PA recommended OTC Zeasorb  powder and she she has been applying it to the skin folds,  rash has  resolved.   Has been using triamcinolone 0.1% cream bid on the diffuse body rash considered to be drug reaction to lamisil . Rash was never pruritic,  And is improving.   Lab Results  Component Value Date   HGBA1C 6.5 03/19/2016    Outpatient Medications Prior to Visit  Medication Sig Dispense Refill  . aspirin 81 MG tablet Take 81 mg by mouth daily.    Marland Kitchen azelastine (ASTELIN) 0.1 % nasal spray Place 2 sprays into both nostrils 2 (two) times daily as needed for rhinitis. Use in each nostril as directed 30 mL 5  . Biotin 5000 MCG CAPS Take 1 capsule by mouth daily.    . Blood Glucose Monitoring Suppl KIT by Does not apply route.    . Cholecalciferol (VITAMIN D3) 1000 UNITS CAPS Take 2 capsules by mouth daily.     . fluocinonide cream (LIDEX) 7.82 % Apply 1 application topically daily as needed. Reported on 9/56/2130    . folic acid (FOLVITE) 1 MG tablet Take 1 mg by mouth daily. Reported on 12/09/2015    . glucose blood (ACCU-CHEK AVIVA PLUS) test strip Use to check blood sugar once daily. 100 each 12  . Lancets (ACCU-CHEK MULTICLIX) lancets Use once daily to check blood sugars   Dx:E11.9 100 each 3  . levothyroxine (SYNTHROID, LEVOTHROID) 75 MCG tablet  Take 1 tablet (75 mcg total) by mouth daily before breakfast. 90 tablet 1  . Sulfacetamide Sodium, Acne, 10 % LOTN APPLY TOPICALLY TO FACE BID  3  . triamcinolone cream (KENALOG) 0.1 %     . metroNIDAZOLE (METROGEL) 1 % gel Apply topically daily. Reported on 12/09/2015  0  . TURMERIC PO Take 1 tablet by mouth. Reported on 03/19/2016    . fluticasone (FLONASE) 50 MCG/ACT nasal spray Place into both nostrils daily. Place 2 spriay into both nostrils daily     No facility-administered medications prior to visit.     Review of Systems;  Patient denies headache, fevers, malaise, unintentional weight loss, skin rash, eye pain, sinus congestion and sinus pain, sore throat, dysphagia,  hemoptysis , cough, dyspnea, wheezing, chest pain, palpitations, orthopnea, edema, abdominal pain, nausea, melena, diarrhea, constipation, flank pain, dysuria, hematuria, urinary  Frequency, nocturia, numbness, tingling, seizures,  Focal weakness, Loss of consciousness,  Tremor, insomnia, depression, anxiety, and suicidal ideation.      Objective:  BP 122/72 (BP Location: Left Wrist, Patient Position: Sitting, Cuff Size: Normal)   Pulse 84   Temp 98 F (36.7 C) (Oral)   Resp 16   Wt 176 lb (79.8 kg)   BMI 29.29 kg/m   BP Readings from Last 3 Encounters:  04/24/16 123/81  04/21/16 122/72  03/19/16 104/64    Wt Readings from Last 3 Encounters:  04/24/16 177 lb (80.3 kg)  04/21/16 176 lb (79.8 kg)  03/19/16 178 lb 8 oz (81 kg)    General appearance: alert, cooperative and appears stated age Ears: normal TM's and external ear canals both ears Throat: lips, mucosa, and tongue normal; teeth and gums normal Neck: no adenopathy, no carotid bruit, supple, symmetrical, trachea midline and thyroid not enlarged, symmetric, no tenderness/mass/nodules Back: symmetric, no curvature. ROM normal. No CVA tenderness. Lungs: clear to auscultation bilaterally Heart: regular rate and rhythm, S1, S2 normal, no murmur, click,  rub or gallop Abdomen: soft, non-tender; bowel sounds normal; no masses,  no organomegaly Pulses: 2+ and symmetric Skin: Skin color, texture, turgor normal. No rashes or lesions Lymph nodes: Cervical, supraclavicular, and axillary nodes normal.  Lab Results  Component Value Date   HGBA1C 6.5 03/19/2016   HGBA1C 6.9 (H) 01/22/2016   HGBA1C 6.6 (H) 07/24/2015    Lab Results  Component Value Date   CREATININE 1.0 04/24/2016   CREATININE 0.97 03/19/2016   CREATININE 0.83 01/22/2016    Lab Results  Component Value Date   WBC 8.1 04/24/2016   HGB 14.6 04/24/2016   HCT 42.0 04/24/2016   PLT 155 04/24/2016   GLUCOSE 105 04/24/2016   CHOL 236 (H) 03/19/2016   TRIG 141.0 03/19/2016   HDL 69.60 03/19/2016   LDLDIRECT 144.0 03/19/2016   LDLCALC 138 (H) 03/19/2016   ALT 30 04/24/2016   AST 22 04/24/2016   NA 140 04/24/2016   K 4.1 04/24/2016   CL 105 03/19/2016   CREATININE 1.0 04/24/2016   BUN 11.0 04/24/2016   CO2 21 (L) 04/24/2016   TSH 0.14 (L) 03/19/2016   INR 1.0 03/14/2012   HGBA1C 6.5 03/19/2016   MICROALBUR 0.8 03/19/2016    Mr Cervical Spine Wo Contrast  Result Date: 08/05/2015 CLINICAL DATA:  Cervical spondylosis with myelopathy. Right arm pain EXAM: MRI CERVICAL SPINE WITHOUT CONTRAST TECHNIQUE: Multiplanar, multisequence MR imaging of the cervical spine was performed. No intravenous contrast was administered. COMPARISON:  None. FINDINGS: Normal cervical alignment. Mild anterior slip T1-2 and T2-3 consistent with disc and facet degeneration. Negative for fracture or mass. No bone marrow edema. Generous sized spinal canal. No cord compression. Spinal cord signal normal without cord lesion. Cervical medullary junction normal. C2-3:  Negative C3-4: Mild disc and mild facet degeneration without spinal or foraminal stenosis. C4-5: Mild disc degeneration and mild uncinate spurring. Bilateral facet hypertrophy. Mild foraminal narrowing bilaterally. No spinal stenosis C5-6:  Mild disc degeneration with uncinate spurring. Mild facet hypertrophy bilaterally. Mild foraminal narrowing bilaterally. C6-7: Moderate disc degeneration and spondylosis. Mild foraminal narrowing on the left due to uncinate spurring. C7-T1: Negative IMPRESSION: Mild cervical disc and facet degeneration at multiple levels. Mild foraminal narrowing bilaterally due to bony overgrowth. No focal disc protrusion. The patient has a generous sized spinal canal and there is no spinal stenosis or cord lesion. Electronically Signed   By: Franchot Gallo M.D.   On: 08/05/2015 10:12    Assessment & Plan:   Problem List Items Addressed This Visit    Candidiasis of skin    Resolving with zeasorb .  Advised to use Gold Bond powderwith Zinc to prevent recurrence       Relevant Medications   miconazole (ZEASORB-AF) 2 % powder   nystatin (MYCOSTATIN/NYSTOP) powder   Drug-induced skin rash    Presumed, secondary to Lamisil. Nearly resolved since medication has been  stopped and triamcinolone cream applied bid        Other Visit Diagnoses   None.    A total of 25 minutes of face to face time was spent with patient more than half of which was spent in counselling about the above mentioned conditions  and coordination of care  I have discontinued Ms. Tiner's metroNIDAZOLE and TURMERIC PO. I am also having her start on nystatin. Additionally, I am having her maintain her aspirin, Blood Glucose Monitoring Suppl, fluocinonide cream, fluticasone, folic acid, Vitamin D3, azelastine, glucose blood, accu-chek multiclix, Biotin, levothyroxine, triamcinolone cream, Sulfacetamide Sodium (Acne), cetirizine, and miconazole.  Meds ordered this encounter  Medications  . cetirizine (ZYRTEC) 10 MG tablet    Sig: Take 10 mg by mouth daily.  . miconazole (ZEASORB-AF) 2 % powder    Sig: Apply topically as needed for itching.  . nystatin (MYCOSTATIN/NYSTOP) powder    Sig: Apply topically 2 (two) times daily. To rash under  breasts and arms    Dispense:  15 g    Refill:  3    Medications Discontinued During This Encounter  Medication Reason  . metroNIDAZOLE (METROGEL) 1 % gel Discontinued by provider  . TURMERIC PO Patient has not taken in last 30 days    Follow-up: No Follow-up on file.   [name], MD

## 2016-04-23 ENCOUNTER — Encounter: Payer: Self-pay | Admitting: Internal Medicine

## 2016-04-24 ENCOUNTER — Ambulatory Visit (HOSPITAL_BASED_OUTPATIENT_CLINIC_OR_DEPARTMENT_OTHER): Payer: PPO | Admitting: Hematology & Oncology

## 2016-04-24 ENCOUNTER — Other Ambulatory Visit (HOSPITAL_BASED_OUTPATIENT_CLINIC_OR_DEPARTMENT_OTHER): Payer: PPO

## 2016-04-24 VITALS — BP 123/81 | HR 85 | Temp 97.7°F | Resp 20 | Wt 177.0 lb

## 2016-04-24 DIAGNOSIS — L270 Generalized skin eruption due to drugs and medicaments taken internally: Secondary | ICD-10-CM | POA: Insufficient documentation

## 2016-04-24 DIAGNOSIS — D0591 Unspecified type of carcinoma in situ of right breast: Secondary | ICD-10-CM

## 2016-04-24 DIAGNOSIS — B372 Candidiasis of skin and nail: Secondary | ICD-10-CM | POA: Insufficient documentation

## 2016-04-24 DIAGNOSIS — Z853 Personal history of malignant neoplasm of breast: Secondary | ICD-10-CM

## 2016-04-24 DIAGNOSIS — E119 Type 2 diabetes mellitus without complications: Secondary | ICD-10-CM

## 2016-04-24 DIAGNOSIS — E559 Vitamin D deficiency, unspecified: Secondary | ICD-10-CM | POA: Diagnosis not present

## 2016-04-24 DIAGNOSIS — D0590 Unspecified type of carcinoma in situ of unspecified breast: Secondary | ICD-10-CM

## 2016-04-24 LAB — CBC WITH DIFFERENTIAL (CANCER CENTER ONLY)
BASO#: 0 10*3/uL (ref 0.0–0.2)
BASO%: 0.4 % (ref 0.0–2.0)
EOS ABS: 0.5 10*3/uL (ref 0.0–0.5)
EOS%: 5.7 % (ref 0.0–7.0)
HEMATOCRIT: 42 % (ref 34.8–46.6)
HEMOGLOBIN: 14.6 g/dL (ref 11.6–15.9)
LYMPH#: 2.4 10*3/uL (ref 0.9–3.3)
LYMPH%: 29.7 % (ref 14.0–48.0)
MCH: 31.4 pg (ref 26.0–34.0)
MCHC: 34.8 g/dL (ref 32.0–36.0)
MCV: 90 fL (ref 81–101)
MONO#: 0.6 10*3/uL (ref 0.1–0.9)
MONO%: 7.1 % (ref 0.0–13.0)
NEUT%: 57.1 % (ref 39.6–80.0)
NEUTROS ABS: 4.6 10*3/uL (ref 1.5–6.5)
Platelets: 155 10*3/uL (ref 145–400)
RBC: 4.65 10*6/uL (ref 3.70–5.32)
RDW: 12.9 % (ref 11.1–15.7)
WBC: 8.1 10*3/uL (ref 3.9–10.0)

## 2016-04-24 LAB — COMPREHENSIVE METABOLIC PANEL
ALBUMIN: 4 g/dL (ref 3.5–5.0)
ALK PHOS: 72 U/L (ref 40–150)
ALT: 30 U/L (ref 0–55)
ANION GAP: 11 meq/L (ref 3–11)
AST: 22 U/L (ref 5–34)
BILIRUBIN TOTAL: 0.67 mg/dL (ref 0.20–1.20)
BUN: 11 mg/dL (ref 7.0–26.0)
CO2: 21 meq/L — AB (ref 22–29)
CREATININE: 1 mg/dL (ref 0.6–1.1)
Calcium: 9.6 mg/dL (ref 8.4–10.4)
Chloride: 108 mEq/L (ref 98–109)
EGFR: 55 mL/min/{1.73_m2} — AB (ref >90)
Glucose: 105 mg/dl (ref 70–140)
Potassium: 4.1 mEq/L (ref 3.5–5.1)
Sodium: 140 mEq/L (ref 136–145)
TOTAL PROTEIN: 7.5 g/dL (ref 6.4–8.3)

## 2016-04-24 LAB — LACTATE DEHYDROGENASE: LDH: 182 U/L (ref 125–245)

## 2016-04-24 NOTE — Assessment & Plan Note (Signed)
Presumed, secondary to Lamisil. Nearly resolved since medication has been stopped and triamcinolone cream applied bid

## 2016-04-24 NOTE — Assessment & Plan Note (Signed)
Resolving with zeasorb .  Advised to use Gold Bond powderwith Zinc to prevent recurrence

## 2016-04-24 NOTE — Progress Notes (Signed)
Hematology and Oncology Follow Up Visit  Jacqueline Mata [id number] 1941-04-02 75 y.o. 04/24/2016   Principle Diagnosis:  Synchronous bilateral stage IIA (T2 N0 M0) ductal carcinoma of bilateral breast.  Current Therapy:    Observation     Interim History:  Ms.  Mata is comes in for followup. Last saw her back in January She is doing okay. She's had no problems since we last saw her. She had a good holiday season. She did not go anywhere.  As always, she shows me pictures of her 2 dogs. She really is dedicated to them.  She's had no problems with cough. She's had no infections. She's had no rashes. She recently had a skin lesion removed from the left supraclavicular region. She said this came back benign.   She's had no nausea or vomiting. She's had no change in bowel or bladder habits. There's been no leg swelling. She really has had no change in medications.  Her last mammogram was in May 2017. I think that everything looked okay.  Overall, her performance status is ECOG 1.   Medications:  Current Outpatient Prescriptions:  .  aspirin 81 MG tablet, Take 81 mg by mouth daily., Disp: , Rfl:  .  azelastine (ASTELIN) 0.1 % nasal spray, Place 2 sprays into both nostrils 2 (two) times daily as needed for rhinitis. Use in each nostril as directed, Disp: 30 mL, Rfl: 5 .  Biotin 5000 MCG CAPS, Take 1 capsule by mouth daily., Disp: , Rfl:  .  Blood Glucose Monitoring Suppl KIT, by Does not apply route., Disp: , Rfl:  .  cetirizine (ZYRTEC) 10 MG tablet, Take 10 mg by mouth daily., Disp: , Rfl:  .  Cholecalciferol (VITAMIN D3) 1000 UNITS CAPS, Take 2 capsules by mouth daily. , Disp: , Rfl:  .  fluocinonide cream (LIDEX) 3.87 %, Apply 1 application topically daily as needed. Reported on 12/09/2015, Disp: , Rfl:  .  fluticasone (FLONASE) 50 MCG/ACT nasal spray, Place into both nostrils daily. Place 2 spriay into both nostrils daily, Disp: , Rfl:  .  folic acid (FOLVITE) 1 MG tablet, Take  1 mg by mouth daily. Reported on 12/09/2015, Disp: , Rfl:  .  glucose blood (ACCU-CHEK AVIVA PLUS) test strip, Use to check blood sugar once daily., Disp: 100 each, Rfl: 12 .  Lancets (ACCU-CHEK MULTICLIX) lancets, Use once daily to check blood sugars   Dx:E11.9, Disp: 100 each, Rfl: 3 .  levothyroxine (SYNTHROID, LEVOTHROID) 75 MCG tablet, Take 1 tablet (75 mcg total) by mouth daily before breakfast., Disp: 90 tablet, Rfl: 1 .  Sulfacetamide Sodium, Acne, 10 % LOTN, APPLY TOPICALLY TO FACE BID, Disp: , Rfl: 3 .  triamcinolone cream (KENALOG) 0.1 %, , Disp: , Rfl:  .  miconazole (ZEASORB-AF) 2 % powder, Apply topically as needed for itching., Disp: , Rfl:  .  nystatin (MYCOSTATIN/NYSTOP) powder, Apply topically 2 (two) times daily. To rash under breasts and arms (Patient not taking: Reported on 04/24/2016), Disp: 15 g, Rfl: 3  Allergies:  Allergies  Allergen Reactions  . Ilevro [Nepafenac]   . Lamisil [Terbinafine Hcl] Rash and Other (See Comments)    Dysphagia and skin fungus  . Adhesive [Tape] Dermatitis  . Codeine   . Mold Extract [Trichophyton Mentagrophyte]   . Neomycin   . Nucynta [Tapentadol] Nausea And Vomiting  . Neomycin-Polymyxin-Hc Rash  . Tramadol     dizziness    Past Medical History, Surgical history, Social history, and Family History were  reviewed and updated.  Review of Systems: As above  Physical Exam:  weight is 177 lb (80.3 kg). Her oral temperature is 97.7 F (36.5 C). Her blood pressure is 123/81 and her pulse is 85. Her respiration is 20.   Well-developed and well-nourished white female. Head and exam has no ocular or oral lesions. She has no palpable cervical or supraclavicular lymph nodes. Lungs are clear bilateral. Cardiac exam regular rate and rhythm with no murmurs rubs or bruits. Abdomen is soft. Has good bowel sounds. There is no fluid wave. Is a palpable liver or spleen tip. Breast exam shows left breast with well-healed lumpectomy at the 9:00 position.  No masses noted in the left breast. There is no left axillary adenopathy. Right breast is somewhat contracted from radiation and surgery. She has a well-healed lumpectomy at the 4:00 position. There is no mass in the right breast. There is no right axillary adenopathy. Extremities shows no clubbing cyanosis or edema. Neurological exam shows no focal neurological deficits. Skin exam no rashes ecchymosis or petechia. Lab Results  Component Value Date   WBC 8.1 04/24/2016   HGB 14.6 04/24/2016   HCT 42.0 04/24/2016   MCV 90 04/24/2016   PLT 155 04/24/2016     Chemistry      Component Value Date/Time   NA 139 03/19/2016 1214   NA 138 10/24/2015 1022   K 3.9 03/19/2016 1214   K 4.1 10/24/2015 1022   CL 105 03/19/2016 1214   CL 107 04/01/2012 0336   CO2 29 03/19/2016 1214   CO2 23 10/24/2015 1022   BUN 16 03/19/2016 1214   BUN 13.6 10/24/2015 1022   CREATININE 0.97 03/19/2016 1214   CREATININE 0.9 10/24/2015 1022      Component Value Date/Time   CALCIUM 9.9 03/19/2016 1214   CALCIUM 9.7 10/24/2015 1022   ALKPHOS 58 03/19/2016 1214   ALKPHOS 70 10/24/2015 1022   AST 18 03/19/2016 1214   AST 20 10/24/2015 1022   ALT 22 03/19/2016 1214   ALT 22 10/24/2015 1022   BILITOT 0.8 03/19/2016 1214   BILITOT 1.17 10/24/2015 1022         Impression and Plan: Jacqueline Mata is 75 year old white female with history of synchronous bilateral breast cancer. Fortunately both breast cancers were node negative.  For now, we will plan to get her back in another 6 months.  It has been 12 years since she had treatment. Again she's done incredibly well. I really think that she is cured.   Volanda Napoleon, MD 7/28/201711:12 AM

## 2016-04-25 LAB — VITAMIN D 25 HYDROXY (VIT D DEFICIENCY, FRACTURES): VIT D 25 HYDROXY: 46.4 ng/mL (ref 30.0–100.0)

## 2016-04-28 ENCOUNTER — Encounter: Payer: Self-pay | Admitting: *Deleted

## 2016-04-29 DIAGNOSIS — M9905 Segmental and somatic dysfunction of pelvic region: Secondary | ICD-10-CM | POA: Diagnosis not present

## 2016-04-29 DIAGNOSIS — M5136 Other intervertebral disc degeneration, lumbar region: Secondary | ICD-10-CM | POA: Diagnosis not present

## 2016-04-29 DIAGNOSIS — M9903 Segmental and somatic dysfunction of lumbar region: Secondary | ICD-10-CM | POA: Diagnosis not present

## 2016-04-29 DIAGNOSIS — M5416 Radiculopathy, lumbar region: Secondary | ICD-10-CM | POA: Diagnosis not present

## 2016-04-30 ENCOUNTER — Ambulatory Visit (INDEPENDENT_AMBULATORY_CARE_PROVIDER_SITE_OTHER): Payer: PPO | Admitting: Internal Medicine

## 2016-04-30 ENCOUNTER — Telehealth: Payer: Self-pay | Admitting: Internal Medicine

## 2016-04-30 DIAGNOSIS — B351 Tinea unguium: Secondary | ICD-10-CM | POA: Diagnosis not present

## 2016-04-30 DIAGNOSIS — E1121 Type 2 diabetes mellitus with diabetic nephropathy: Secondary | ICD-10-CM

## 2016-04-30 DIAGNOSIS — B372 Candidiasis of skin and nail: Secondary | ICD-10-CM

## 2016-04-30 NOTE — Telephone Encounter (Signed)
Pt need orders for lab work please and thank you!  Call pt @ [phone].

## 2016-04-30 NOTE — Progress Notes (Signed)
Subjective:  Patient ID: Jacqueline Mata, female    DOB: [birth year]  Age: 75 y.o. MRN: [id number]  CC: Diagnoses of Onychomycosis and Candidiasis of skin were pertinent to this visit.  HPI Jacqueline Mata presents for follow up ON ONYCHOMYCOSIS  With recent drug reaction to lamisil.  The diffuse body rash has resolved and the axillary/inguinal rash has as well.  She is using Gold Bond medicated powder with zinc in those areas to prevent recurrence. She has started using vinegar soaks to address the nail problem and sees a slight improvement.      Outpatient Medications Prior to Visit  Medication Sig Dispense Refill  . aspirin 81 MG tablet Take 81 mg by mouth daily.    Marland Kitchen azelastine (ASTELIN) 0.1 % nasal spray Place 2 sprays into both nostrils 2 (two) times daily as needed for rhinitis. Use in each nostril as directed 30 mL 5  . Biotin 5000 MCG CAPS Take 1 capsule by mouth daily.    . Blood Glucose Monitoring Suppl KIT by Does not apply route.    . cetirizine (ZYRTEC) 10 MG tablet Take 10 mg by mouth daily.    . Cholecalciferol (VITAMIN D3) 1000 UNITS CAPS Take 2 capsules by mouth daily.     . fluocinonide cream (LIDEX) 5.70 % Apply 1 application topically daily as needed. Reported on 12/09/2015    . fluticasone (FLONASE) 50 MCG/ACT nasal spray Place into both nostrils daily. Place 2 spriay into both nostrils daily    . folic acid (FOLVITE) 1 MG tablet Take 1 mg by mouth daily. Reported on 12/09/2015    . glucose blood (ACCU-CHEK AVIVA PLUS) test strip Use to check blood sugar once daily. 100 each 12  . Lancets (ACCU-CHEK MULTICLIX) lancets Use once daily to check blood sugars   Dx:E11.9 100 each 3  . levothyroxine (SYNTHROID, LEVOTHROID) 75 MCG tablet Take 1 tablet (75 mcg total) by mouth daily before breakfast. 90 tablet 1  . miconazole (ZEASORB-AF) 2 % powder Apply topically as needed for itching.    . Sulfacetamide Sodium, Acne, 10 % LOTN APPLY TOPICALLY TO FACE BID  3  .  triamcinolone cream (KENALOG) 0.1 %     . nystatin (MYCOSTATIN/NYSTOP) powder Apply topically 2 (two) times daily. To rash under breasts and arms 15 g 3   No facility-administered medications prior to visit.     Review of Systems;  Patient denies headache, fevers, malaise, unintentional weight loss, skin rash, eye pain, sinus congestion and sinus pain, sore throat, dysphagia,  hemoptysis , cough, dyspnea, wheezing, chest pain, palpitations, orthopnea, edema, abdominal pain, nausea, melena, diarrhea, constipation, flank pain, dysuria, hematuria, urinary  Frequency, nocturia, numbness, tingling, seizures,  Focal weakness, Loss of consciousness,  Tremor, insomnia, depression, anxiety, and suicidal ideation.      Objective:  Pulse 87   Temp 97.7 F (36.5 C) (Oral)   Ht _0  (1.651 m)   Wt 177 lb 6 oz (80.5 kg)   SpO2 96%   BMI 29.52 kg/m   BP Readings from Last 3 Encounters:  04/24/16 123/81  04/21/16 122/72  03/19/16 104/64    Wt Readings from Last 3 Encounters:  04/30/16 177 lb 6 oz (80.5 kg)  04/24/16 177 lb (80.3 kg)  04/21/16 176 lb (79.8 kg)    General appearance: alert, cooperative and appears stated age Ears: normal TM's and external ear canals both ears Throat: lips, mucosa, and tongue normal; teeth and gums normal Neck: no adenopathy, no carotid  bruit, supple, symmetrical, trachea midline and thyroid not enlarged, symmetric, no tenderness/mass/nodules Back: symmetric, no curvature. ROM normal. No CVA tenderness. Lungs: clear to auscultation bilaterally Heart: regular rate and rhythm, S1, S2 normal, no murmur, click, rub or gallop Abdomen: soft, non-tender; bowel sounds normal; no masses,  no organomegaly Pulses: 2+ and symmetric Skin: Skin color, texture, turgor normal. No rashes or lesions Lymph nodes: Cervical, supraclavicular, and axillary nodes normal.  Lab Results  Component Value Date   HGBA1C 6.5 03/19/2016   HGBA1C 6.9 (H) 01/22/2016   HGBA1C 6.6 (H)  07/24/2015    Lab Results  Component Value Date   CREATININE 1.0 04/24/2016   CREATININE 0.97 03/19/2016   CREATININE 0.83 01/22/2016    Lab Results  Component Value Date   WBC 8.1 04/24/2016   HGB 14.6 04/24/2016   HCT 42.0 04/24/2016   PLT 155 04/24/2016   GLUCOSE 105 04/24/2016   CHOL 236 (H) 03/19/2016   TRIG 141.0 03/19/2016   HDL 69.60 03/19/2016   LDLDIRECT 144.0 03/19/2016   LDLCALC 138 (H) 03/19/2016   ALT 30 04/24/2016   AST 22 04/24/2016   NA 140 04/24/2016   K 4.1 04/24/2016   CL 105 03/19/2016   CREATININE 1.0 04/24/2016   BUN 11.0 04/24/2016   CO2 21 (L) 04/24/2016   TSH 0.14 (L) 03/19/2016   INR 1.0 03/14/2012   HGBA1C 6.5 03/19/2016   MICROALBUR 0.8 03/19/2016    Mr Cervical Spine Wo Contrast  Result Date: 08/05/2015 CLINICAL DATA:  Cervical spondylosis with myelopathy. Right arm pain EXAM: MRI CERVICAL SPINE WITHOUT CONTRAST TECHNIQUE: Multiplanar, multisequence MR imaging of the cervical spine was performed. No intravenous contrast was administered. COMPARISON:  None. FINDINGS: Normal cervical alignment. Mild anterior slip T1-2 and T2-3 consistent with disc and facet degeneration. Negative for fracture or mass. No bone marrow edema. Generous sized spinal canal. No cord compression. Spinal cord signal normal without cord lesion. Cervical medullary junction normal. C2-3:  Negative C3-4: Mild disc and mild facet degeneration without spinal or foraminal stenosis. C4-5: Mild disc degeneration and mild uncinate spurring. Bilateral facet hypertrophy. Mild foraminal narrowing bilaterally. No spinal stenosis C5-6: Mild disc degeneration with uncinate spurring. Mild facet hypertrophy bilaterally. Mild foraminal narrowing bilaterally. C6-7: Moderate disc degeneration and spondylosis. Mild foraminal narrowing on the left due to uncinate spurring. C7-T1: Negative IMPRESSION: Mild cervical disc and facet degeneration at multiple levels. Mild foraminal narrowing bilaterally  due to bony overgrowth. No focal disc protrusion. The patient has a generous sized spinal canal and there is no spinal stenosis or cord lesion. Electronically Signed   By: Franchot Gallo M.D.   On: 08/05/2015 10:12    Assessment & Plan:   Problem List Items Addressed This Visit    Onychomycosis    She developed a drug rash to Lamisil.  Continue topical vinegar and tea tree oil.       Candidiasis of skin    Now resolved.  Advised to continue using Gold Bond Medicated Powder with zinc during hot weather.        Other Visit Diagnoses   None.     I have discontinued Ms. Gotwalt's nystatin. I am also having her maintain her aspirin, Blood Glucose Monitoring Suppl, fluocinonide cream, fluticasone, folic acid, Vitamin D3, azelastine, glucose blood, accu-chek multiclix, Biotin, levothyroxine, triamcinolone cream, Sulfacetamide Sodium (Acne), cetirizine, and miconazole.  No orders of the defined types were placed in this encounter.   Medications Discontinued During This Encounter  Medication Reason  .  nystatin (MYCOSTATIN/NYSTOP) powder Patient Preference    Follow-up: No Follow-up on file.   [name], MD

## 2016-04-30 NOTE — Telephone Encounter (Signed)
No labs needed today .  Return in late sept/October for labs, ordered  Lab Results  Component Value Date   HGBA1C 6.5 03/19/2016

## 2016-04-30 NOTE — Telephone Encounter (Signed)
Spoke with patient, she has orders for lab already, which I reviewed with her and she wanted to see if you wanted to add a A1C and LDL ? Please advise and order if needed. thanks

## 2016-05-03 NOTE — Assessment & Plan Note (Signed)
She developed a drug rash to Lamisil.  Continue topical vinegar and tea tree oil.

## 2016-05-03 NOTE — Assessment & Plan Note (Signed)
Now resolved.  Advised to continue using Gold Bond Medicated Powder with zinc during hot weather.

## 2016-05-07 ENCOUNTER — Encounter: Payer: Self-pay | Admitting: Internal Medicine

## 2016-05-20 DIAGNOSIS — M9905 Segmental and somatic dysfunction of pelvic region: Secondary | ICD-10-CM | POA: Diagnosis not present

## 2016-05-20 DIAGNOSIS — M5416 Radiculopathy, lumbar region: Secondary | ICD-10-CM | POA: Diagnosis not present

## 2016-05-20 DIAGNOSIS — M9903 Segmental and somatic dysfunction of lumbar region: Secondary | ICD-10-CM | POA: Diagnosis not present

## 2016-05-20 DIAGNOSIS — M5136 Other intervertebral disc degeneration, lumbar region: Secondary | ICD-10-CM | POA: Diagnosis not present

## 2016-05-25 ENCOUNTER — Encounter: Payer: Self-pay | Admitting: Podiatry

## 2016-05-25 ENCOUNTER — Ambulatory Visit (INDEPENDENT_AMBULATORY_CARE_PROVIDER_SITE_OTHER): Payer: PPO | Admitting: Podiatry

## 2016-05-25 VITALS — BP 132/69 | HR 88 | Resp 12

## 2016-05-25 DIAGNOSIS — L603 Nail dystrophy: Secondary | ICD-10-CM

## 2016-05-25 DIAGNOSIS — B351 Tinea unguium: Secondary | ICD-10-CM | POA: Diagnosis not present

## 2016-05-25 NOTE — Progress Notes (Signed)
   Subjective:    Patient ID: Jacqueline Mata, female    DOB: May 21, 1941, 75 y.o.   MRN: [id number]  HPI:She presents today chief complaint of thickening of the toenails hallux bilaterally. She states that think it may have gotten a nail fungus from having pedicures done. I also think I have a nail fungus from possibly picking at my toenails that are now in my fingernails. States that she is going to the dermatologist in the next few days. She will like to have a sample of her toenails taken for evaluation. She states that she has taken terbinafine in the past which caused a reaction. And she does not want to take Lamisil again.    Review of Systems  Skin: Positive for color change.       Objective:   Physical Exam: Vital signs are stable she is alert and oriented 3. Pulses are palpable. Neurologic sensorium is intact deep tendon reflexes are intact muscle strength is normal. Orthopedic evaluation of his resolve to assist to the ankle for range of motion crepitation. Mild possible hammertoe deformities are noted bilateral. Cutaneous evaluation straight supple well-hydrated cutis no erythema edema saline as drainage or odor toenails hallux bilateral are thick dystrophic possibly mycotic. Fifth digits appear to be dystrophic. She also has changes to her fingernails which appeared to possibly be mycotic as well.        Assessment & Plan:  Nail dystrophy hallux bilateral.  Plan: Took samples of the toenails today hallux bilaterally. Sent for pathologic evaluation and will follow-up with them once the results have arrived.

## 2016-05-25 NOTE — Progress Notes (Signed)
   Subjective:    Patient ID: Jacqueline Mata, female    DOB: [birth year], 75 y.o.   MRN: [id number]  HPI    Review of Systems     Objective:   Physical Exam        Assessment & Plan:

## 2016-05-27 DIAGNOSIS — B351 Tinea unguium: Secondary | ICD-10-CM | POA: Diagnosis not present

## 2016-06-03 ENCOUNTER — Encounter: Payer: Self-pay | Admitting: Internal Medicine

## 2016-06-08 DIAGNOSIS — H903 Sensorineural hearing loss, bilateral: Secondary | ICD-10-CM | POA: Diagnosis not present

## 2016-06-08 DIAGNOSIS — J310 Chronic rhinitis: Secondary | ICD-10-CM | POA: Diagnosis not present

## 2016-06-09 DIAGNOSIS — M9905 Segmental and somatic dysfunction of pelvic region: Secondary | ICD-10-CM | POA: Diagnosis not present

## 2016-06-09 DIAGNOSIS — M5416 Radiculopathy, lumbar region: Secondary | ICD-10-CM | POA: Diagnosis not present

## 2016-06-09 DIAGNOSIS — M5136 Other intervertebral disc degeneration, lumbar region: Secondary | ICD-10-CM | POA: Diagnosis not present

## 2016-06-09 DIAGNOSIS — M9903 Segmental and somatic dysfunction of lumbar region: Secondary | ICD-10-CM | POA: Diagnosis not present

## 2016-06-17 ENCOUNTER — Encounter: Payer: Self-pay | Admitting: Podiatry

## 2016-06-17 ENCOUNTER — Ambulatory Visit (INDEPENDENT_AMBULATORY_CARE_PROVIDER_SITE_OTHER): Payer: PPO | Admitting: Podiatry

## 2016-06-17 DIAGNOSIS — Z79899 Other long term (current) drug therapy: Secondary | ICD-10-CM

## 2016-06-17 DIAGNOSIS — L603 Nail dystrophy: Secondary | ICD-10-CM | POA: Diagnosis not present

## 2016-06-17 NOTE — Progress Notes (Signed)
She presents today for follow-up of her fungal report.  Objective: Vital signs are stable she is alert and oriented 3. The pathology report came back primarily for nail dystrophy very little in the way of nail fungus or any type of dermatophyte.  Assessment: Nail dystrophy bilateral.  Plan: Discussed the use of keratolytics and routine debridement. She is unable to take any oral medication we did discuss the possibility of laser therapy for her toenails.

## 2016-06-19 ENCOUNTER — Ambulatory Visit: Payer: PPO | Admitting: Internal Medicine

## 2016-07-01 ENCOUNTER — Encounter: Payer: Self-pay | Admitting: Internal Medicine

## 2016-07-01 DIAGNOSIS — M9905 Segmental and somatic dysfunction of pelvic region: Secondary | ICD-10-CM | POA: Diagnosis not present

## 2016-07-01 DIAGNOSIS — M9903 Segmental and somatic dysfunction of lumbar region: Secondary | ICD-10-CM | POA: Diagnosis not present

## 2016-07-01 DIAGNOSIS — M5416 Radiculopathy, lumbar region: Secondary | ICD-10-CM | POA: Diagnosis not present

## 2016-07-01 DIAGNOSIS — M5136 Other intervertebral disc degeneration, lumbar region: Secondary | ICD-10-CM | POA: Diagnosis not present

## 2016-07-07 ENCOUNTER — Ambulatory Visit (INDEPENDENT_AMBULATORY_CARE_PROVIDER_SITE_OTHER): Payer: PPO | Admitting: Family

## 2016-07-07 ENCOUNTER — Encounter: Payer: Self-pay | Admitting: Family

## 2016-07-07 VITALS — BP 132/88 | HR 89 | Temp 97.0°F | Wt 177.4 lb

## 2016-07-07 DIAGNOSIS — J0120 Acute ethmoidal sinusitis, unspecified: Secondary | ICD-10-CM | POA: Diagnosis not present

## 2016-07-07 MED ORDER — AMOXICILLIN 500 MG PO CAPS: 500.0000 mg | ORAL_CAPSULE | Freq: Two times a day (BID) | ORAL | 0 refills | Status: DC

## 2016-07-07 NOTE — Progress Notes (Signed)
Subjective:    Patient ID: Jacqueline Mata, female    DOB: 29-Mar-1941, 76 y.o.   MRN: [id number]  CC: Jacqueline Mata is a 75 y.o. female who presents today for an acute visit.    HPI: Patient here for acute visit with chief complaint sinus congestion for 2 months, Waxing and waning. Thick congestion. Started with post nasal drip. Saw ENT and started on ipratropium bromide nasal. Has tried zyrtec, affrin, and nasal spray with some relief. No fevers, chills.     HISTORY:  Past Medical History:  Diagnosis Date  . breast cancer   . Cataract 01/04/13 and 03/01/13  . Diabetes mellitus   . Hyperlipidemia   . hypothyroidism    Past Surgical History:  Procedure Laterality Date  . JOINT REPLACEMENT  2013   by Dr. Marry Guan   Family History  Problem Relation Age of Onset  . Cancer Sister     lung, former tobacco  . Cancer Brother 79    lung, thyroid, melanoma  . Cancer Mother 16    ovarian  . Diabetes Son     Allergies: Ilevro [nepafenac]; Lamisil [terbinafine hcl]; Adhesive [tape]; Codeine; Mold extract [trichophyton mentagrophyte]; Neomycin; Nucynta [tapentadol]; Neomycin-polymyxin-hc; and Tramadol Current Outpatient Prescriptions on File Prior to Visit  Medication Sig Dispense Refill  . aspirin 81 MG tablet Take 81 mg by mouth daily.    Marland Kitchen azelastine (ASTELIN) 0.1 % nasal spray Place 2 sprays into both nostrils 2 (two) times daily as needed for rhinitis. Use in each nostril as directed 30 mL 5  . Biotin 5000 MCG CAPS Take 1 capsule by mouth daily.    . Blood Glucose Monitoring Suppl KIT by Does not apply route.    . cetirizine (ZYRTEC) 10 MG tablet Take 10 mg by mouth daily.    . Cholecalciferol (VITAMIN D3) 1000 UNITS CAPS Take 2 capsules by mouth daily.     . fluocinonide cream (LIDEX) 5.69 % Apply 1 application topically daily as needed. Reported on 12/09/2015    . fluticasone (FLONASE) 50 MCG/ACT nasal spray Place into both nostrils daily. Place 2 spriay into both nostrils daily     . folic acid (FOLVITE) 1 MG tablet Take 1 mg by mouth daily. Reported on 12/09/2015    . glucose blood (ACCU-CHEK AVIVA PLUS) test strip Use to check blood sugar once daily. 100 each 12  . Lancets (ACCU-CHEK MULTICLIX) lancets Use once daily to check blood sugars   Dx:E11.9 100 each 3  . levothyroxine (SYNTHROID, LEVOTHROID) 75 MCG tablet Take 1 tablet (75 mcg total) by mouth daily before breakfast. 90 tablet 1  . miconazole (ZEASORB-AF) 2 % powder Apply topically as needed for itching.    . Sulfacetamide Sodium, Acne, 10 % LOTN APPLY TOPICALLY TO FACE BID  3   No current facility-administered medications on file prior to visit.     Social History  Substance Use Topics  . Smoking status: Former Smoker    Packs/day: 1.00    Years: 19.00    Types: Cigarettes    Start date: 01/02/1960    Quit date: 07/22/1978  . Smokeless tobacco: Never Used     Comment: quit 36 years ago  . Alcohol use 0.0 oz/week     Comment: rare    Review of Systems  Constitutional: Negative for chills and fever.  HENT: Positive for congestion, ear pain (resolved, had had pressure) and sore throat (mild, since resolved). Negative for ear discharge and sinus pressure.  Eyes: Negative for visual disturbance.  Respiratory: Negative for cough, shortness of breath and wheezing.   Cardiovascular: Negative for chest pain and palpitations.  Gastrointestinal: Negative for nausea and vomiting.  Neurological: Negative for headaches.      Objective:    BP 132/88   Pulse 89   Temp 97 F (36.1 C) (Oral)   Wt 177 lb 6.4 oz (80.5 kg)   SpO2 97%   BMI 29.52 kg/m    Physical Exam  Constitutional: She appears well-developed and well-nourished.  HENT:  Head: Normocephalic and atraumatic.  Right Ear: Hearing, tympanic membrane, external ear and ear canal normal. No drainage, swelling or tenderness. No foreign bodies. Tympanic membrane is not erythematous and not bulging. No middle ear effusion. No decreased hearing is  noted.  Left Ear: Hearing, tympanic membrane, external ear and ear canal normal. No drainage, swelling or tenderness. No foreign bodies. Tympanic membrane is not erythematous and not bulging.  No middle ear effusion. No decreased hearing is noted.  Nose: Nose normal. No rhinorrhea. Right sinus exhibits no maxillary sinus tenderness and no frontal sinus tenderness. Left sinus exhibits no maxillary sinus tenderness and no frontal sinus tenderness.  Mouth/Throat: Uvula is midline, oropharynx is clear and moist and mucous membranes are normal. No oropharyngeal exudate, posterior oropharyngeal edema, posterior oropharyngeal erythema or tonsillar abscesses.  Eyes: Conjunctivae are normal.  Cardiovascular: Regular rhythm, normal heart sounds and normal pulses.   Pulmonary/Chest: Effort normal and breath sounds normal. She has no wheezes. She has no rhonchi. She has no rales.  Lymphadenopathy:       Head (right side): No submental, no submandibular, no tonsillar, no preauricular, no posterior auricular and no occipital adenopathy present.       Head (left side): No submental, no submandibular, no tonsillar, no preauricular, no posterior auricular and no occipital adenopathy present.    She has no cervical adenopathy.  Neurological: She is alert.  Skin: Skin is warm and dry.  Psychiatric: She has a normal mood and affect. Her speech is normal and behavior is normal. Thought content normal.  Vitals reviewed.      Assessment & Plan:   1. Acute ethmoidal sinusitis, recurrence not specified Afebrile. Based on duration of symptoms, patient I jointly agreed to start antibiotic treatment. Advised to take ipratropium bromide nasal spray as ENT as prescribed. Return precautions given.  - amoxicillin (AMOXIL) 500 MG capsule; Take 1 capsule (500 mg total) by mouth 2 (two) times daily.  Dispense: 14 capsule; Refill: 0    I have discontinued Jacqueline Mata's triamcinolone cream. I am also having her maintain her  aspirin, Blood Glucose Monitoring Suppl, fluocinonide cream, fluticasone, folic acid, Vitamin D3, azelastine, glucose blood, accu-chek multiclix, Biotin, levothyroxine, Sulfacetamide Sodium (Acne), cetirizine, miconazole, and ipratropium.   Meds ordered this encounter  Medications  . ipratropium (ATROVENT) 0.06 % nasal spray    Return precautions given.   Risks, benefits, and alternatives of the medications and treatment plan prescribed today were discussed, and patient expressed understanding.   Education regarding symptom management and diagnosis given to patient on AVS.  Continue to follow with TULLO, Aris Everts, MD for routine health maintenance.   Jacqueline Mata and I agreed with plan.   Jacqueline Paris, FNP

## 2016-07-07 NOTE — Progress Notes (Signed)
Pre visit review using our clinic review tool, if applicable. No additional management support is needed unless otherwise documented below in the visit note. 

## 2016-07-07 NOTE — Patient Instructions (Signed)
Take ipratropium bromide as prescribed.   Let us know if not better  Increase intake of clear fluids. Congestion is best treated by hydration, when mucus is wetter, it is thinner, less sticky, and easier to expel from the body, either through coughing up drainage, or by blowing your nose.   Get plenty of rest.   Use saline nasal drops and blow your nose frequently. Run a humidifier at night and elevate the head of the bed. Vicks Vapor rub will help with congestion and cough. Steam showers and sinus massage for congestion.   Use Acetaminophen or Ibuprofen as needed for fever or pain. Avoid second hand smoke. Even the smallest exposure will worsen symptoms.   Over the counter medications you can try include Delsym for cough, a decongestant for congestion, and Mucinex or Robitussin as an expectorant. Be sure to just get the plain Mucinex or Robitussin that just has one medication (Guaifenesen). We don't recommend the combination products. Note, be sure to drink two glasses of water with each dose of Mucinex as the medication will not work well without adequate hydration.   You can also try a teaspoon of honey to see if this will help reduce cough. Throat lozenges can sometimes be beneficial as well.    This illness will typically last 7 - 10 days.   Please follow up with our clinic if you develop a fever greater than 101 F, symptoms worsen, or do not resolve in the next week.

## 2016-07-10 ENCOUNTER — Ambulatory Visit: Payer: PPO | Admitting: Internal Medicine

## 2016-07-20 ENCOUNTER — Telehealth: Payer: Self-pay | Admitting: Internal Medicine

## 2016-07-20 DIAGNOSIS — E034 Atrophy of thyroid (acquired): Secondary | ICD-10-CM

## 2016-07-20 DIAGNOSIS — E782 Mixed hyperlipidemia: Secondary | ICD-10-CM

## 2016-07-20 DIAGNOSIS — E119 Type 2 diabetes mellitus without complications: Secondary | ICD-10-CM

## 2016-07-20 DIAGNOSIS — R5383 Other fatigue: Secondary | ICD-10-CM

## 2016-07-20 NOTE — Telephone Encounter (Signed)
Pt called and rescheduled and appointment and was asking if Dr. Derrel Nip could put in some orders for lab work. Please advise, thank you!  Call pt @ [phone]

## 2016-07-20 NOTE — Telephone Encounter (Signed)
I have pended labs anything else?

## 2016-07-20 NOTE — Telephone Encounter (Signed)
Done - thank you.

## 2016-07-20 NOTE — Telephone Encounter (Signed)
Labs in schedule lab appointment.

## 2016-07-22 DIAGNOSIS — M5136 Other intervertebral disc degeneration, lumbar region: Secondary | ICD-10-CM | POA: Diagnosis not present

## 2016-07-22 DIAGNOSIS — M9905 Segmental and somatic dysfunction of pelvic region: Secondary | ICD-10-CM | POA: Diagnosis not present

## 2016-07-22 DIAGNOSIS — M9903 Segmental and somatic dysfunction of lumbar region: Secondary | ICD-10-CM | POA: Diagnosis not present

## 2016-07-22 DIAGNOSIS — M5416 Radiculopathy, lumbar region: Secondary | ICD-10-CM | POA: Diagnosis not present

## 2016-07-23 ENCOUNTER — Ambulatory Visit: Payer: PPO | Admitting: Internal Medicine

## 2016-07-27 ENCOUNTER — Ambulatory Visit: Payer: PPO | Admitting: Internal Medicine

## 2016-08-01 ENCOUNTER — Encounter: Payer: Self-pay | Admitting: Internal Medicine

## 2016-08-17 DIAGNOSIS — M5416 Radiculopathy, lumbar region: Secondary | ICD-10-CM | POA: Diagnosis not present

## 2016-08-17 DIAGNOSIS — M9903 Segmental and somatic dysfunction of lumbar region: Secondary | ICD-10-CM | POA: Diagnosis not present

## 2016-08-17 DIAGNOSIS — M5136 Other intervertebral disc degeneration, lumbar region: Secondary | ICD-10-CM | POA: Diagnosis not present

## 2016-08-17 DIAGNOSIS — M9905 Segmental and somatic dysfunction of pelvic region: Secondary | ICD-10-CM | POA: Diagnosis not present

## 2016-08-18 DIAGNOSIS — Z96653 Presence of artificial knee joint, bilateral: Secondary | ICD-10-CM | POA: Diagnosis not present

## 2016-09-01 ENCOUNTER — Encounter: Payer: Self-pay | Admitting: Internal Medicine

## 2016-09-02 ENCOUNTER — Encounter: Payer: Self-pay | Admitting: Internal Medicine

## 2016-09-02 ENCOUNTER — Ambulatory Visit (INDEPENDENT_AMBULATORY_CARE_PROVIDER_SITE_OTHER): Payer: PPO | Admitting: Internal Medicine

## 2016-09-02 ENCOUNTER — Other Ambulatory Visit (INDEPENDENT_AMBULATORY_CARE_PROVIDER_SITE_OTHER): Payer: PPO

## 2016-09-02 VITALS — BP 124/68 | HR 85 | Temp 97.5°F | Resp 12 | Ht 65.0 in | Wt 174.5 lb

## 2016-09-02 DIAGNOSIS — E119 Type 2 diabetes mellitus without complications: Secondary | ICD-10-CM

## 2016-09-02 DIAGNOSIS — B372 Candidiasis of skin and nail: Secondary | ICD-10-CM | POA: Diagnosis not present

## 2016-09-02 DIAGNOSIS — E034 Atrophy of thyroid (acquired): Secondary | ICD-10-CM

## 2016-09-02 DIAGNOSIS — B351 Tinea unguium: Secondary | ICD-10-CM | POA: Diagnosis not present

## 2016-09-02 DIAGNOSIS — R5383 Other fatigue: Secondary | ICD-10-CM | POA: Diagnosis not present

## 2016-09-02 DIAGNOSIS — E782 Mixed hyperlipidemia: Secondary | ICD-10-CM

## 2016-09-02 LAB — COMPREHENSIVE METABOLIC PANEL
ALBUMIN: 4.3 g/dL (ref 3.5–5.2)
ALK PHOS: 59 U/L (ref 39–117)
ALT: 25 U/L (ref 0–35)
AST: 18 U/L (ref 0–37)
BILIRUBIN TOTAL: 0.8 mg/dL (ref 0.2–1.2)
BUN: 16 mg/dL (ref 6–23)
CALCIUM: 9.5 mg/dL (ref 8.4–10.5)
CO2: 26 meq/L (ref 19–32)
CREATININE: 0.9 mg/dL (ref 0.40–1.20)
Chloride: 105 mEq/L (ref 96–112)
GFR: 64.93 mL/min (ref >60.00)
Glucose, Bld: 164 mg/dL — ABNORMAL HIGH (ref 70–99)
Potassium: 4.3 mEq/L (ref 3.5–5.1)
Sodium: 139 mEq/L (ref 135–145)
TOTAL PROTEIN: 6.7 g/dL (ref 6.0–8.3)

## 2016-09-02 LAB — CBC WITH DIFFERENTIAL/PLATELET
BASOS ABS: 0.1 10*3/uL (ref 0.0–0.1)
Basophils Relative: 0.8 % (ref 0.0–3.0)
EOS ABS: 0.3 10*3/uL (ref 0.0–0.7)
Eosinophils Relative: 4.3 % (ref 0.0–5.0)
HEMATOCRIT: 43.3 % (ref 36.0–46.0)
HEMOGLOBIN: 14.9 g/dL (ref 12.0–15.0)
LYMPHS PCT: 36.5 % (ref 12.0–46.0)
Lymphs Abs: 2.6 10*3/uL (ref 0.7–4.0)
MCHC: 34.4 g/dL (ref 30.0–36.0)
MCV: 91 fl (ref 78.0–100.0)
MONOS PCT: 5.2 % (ref 3.0–12.0)
Monocytes Absolute: 0.4 10*3/uL (ref 0.1–1.0)
Neutro Abs: 3.8 10*3/uL (ref 1.4–7.7)
Neutrophils Relative %: 53.2 % (ref 43.0–77.0)
PLATELETS: 191 10*3/uL (ref 150.0–400.0)
RBC: 4.76 Mil/uL (ref 3.87–5.11)
RDW: 13.6 % (ref 11.5–15.5)
WBC: 7.1 10*3/uL (ref 4.0–10.5)

## 2016-09-02 LAB — TSH: TSH: 2.82 u[IU]/mL (ref 0.35–4.50)

## 2016-09-02 LAB — LIPID PANEL
CHOLESTEROL: 257 mg/dL — AB (ref 0–200)
HDL: 76.1 mg/dL (ref >39.00)
LDL CALC: 165 mg/dL — AB (ref 0–99)
NonHDL: 180.73
Total CHOL/HDL Ratio: 3
Triglycerides: 80 mg/dL (ref 0.0–149.0)
VLDL: 16 mg/dL (ref 0.0–40.0)

## 2016-09-02 LAB — HEMOGLOBIN A1C: Hgb A1c MFr Bld: 6.5 % (ref 4.6–6.5)

## 2016-09-02 MED ORDER — NYSTATIN 100000 UNIT/GM EX POWD: Freq: Two times a day (BID) | CUTANEOUS | 2 refills | Status: DC

## 2016-09-02 NOTE — Progress Notes (Signed)
Pre-visit discussion using our clinic review tool. No additional management support is needed unless otherwise documented below in the visit note.  

## 2016-09-02 NOTE — Progress Notes (Signed)
Subjective:  Patient ID: Jacqueline Mata, female    DOB: 1941/04/14  Age: 75 y.o. MRN: [id number]  CC: The primary encounter diagnosis was Diabetes mellitus type 2, diet-controlled ([place]). Diagnoses of Mixed hyperlipidemia, Candidiasis of skin, and Onychomycosis were also pertinent to this visit.  HPI [name] presents for 3 month follow up on diabetes.  Last seen in August  Patient has no complaints today.  Patient is following a low glycemic index diet  .   Fasting sugars have been under less than 140 most of the time and post prandials have been under 160 except on rare occasions. Patient is exercising about 3 times per week and intentionally trying to lose weight .  Patient has had an eye exam in the last 12 months and checks feet regularly for signs of infection.  Patient does not walk barefoot outside,  And denies any numbness tingling or burning in feet. Patient is up to date on all recommended vaccinations   She did not tolerate terbinafine for treatment of onychomycosis due to the development of a diffuse rash.   Toenail  biopsy showed minimal fungus.  Podiatry gave her a topical treatment which has discolored her nail.   She has developed Candidiasis of skin folds under her pannus and inguinal folds.   [place]  gave her miconazole .   Lab Results  Component Value Date   HGBA1C 6.5 09/02/2016   Lab Results  Component Value Date   MICROALBUR 0.8 03/19/2016   Lab Results  Component Value Date   CHOL 257 (H) 09/02/2016   HDL 76.10 09/02/2016   LDLCALC 165 (H) 09/02/2016   LDLDIRECT 144.0 03/19/2016   TRIG 80.0 09/02/2016   CHOLHDL 3 09/02/2016     Outpatient Medications Prior to Visit  Medication Sig Dispense Refill  . amoxicillin (AMOXIL) 500 MG capsule Take 1 capsule (500 mg total) by mouth 2 (two) times daily. 14 capsule 0  . aspirin 81 MG tablet Take 81 mg by mouth daily.    . Biotin 5000 MCG CAPS Take 1 capsule by mouth daily.    . Blood Glucose  Monitoring Suppl KIT by Does not apply route.    . cetirizine (ZYRTEC) 10 MG tablet Take 10 mg by mouth daily.    . Cholecalciferol (VITAMIN D3) 1000 UNITS CAPS Take 2 capsules by mouth daily.     . fluocinonide cream (LIDEX) 7.54 % Apply 1 application topically daily as needed. Reported on 12/09/2015    . fluticasone (FLONASE) 50 MCG/ACT nasal spray Place into both nostrils daily. Place 2 spriay into both nostrils daily    . folic acid (FOLVITE) 1 MG tablet Take 1 mg by mouth daily. Reported on 12/09/2015    . glucose blood (ACCU-CHEK AVIVA PLUS) test strip Use to check blood sugar once daily. 100 each 12  . ipratropium (ATROVENT) 0.06 % nasal spray     . Lancets (ACCU-CHEK MULTICLIX) lancets Use once daily to check blood sugars   Dx:E11.9 100 each 3  . levothyroxine (SYNTHROID, LEVOTHROID) 75 MCG tablet Take 1 tablet (75 mcg total) by mouth daily before breakfast. 90 tablet 1  . miconazole (ZEASORB-AF) 2 % powder Apply topically as needed for itching.    . Sulfacetamide Sodium, Acne, 10 % LOTN APPLY TOPICALLY TO FACE BID  3  . azelastine (ASTELIN) 0.1 % nasal spray Place 2 sprays into both nostrils 2 (two) times daily as needed for rhinitis. Use in each nostril as directed (Patient not taking:  Reported on 09/02/2016) 30 mL 5   No facility-administered medications prior to visit.     Review of Systems;  Patient denies headache, fevers, malaise, unintentional weight loss, eye pain, sinus congestion and sinus pain, sore throat, dysphagia,  hemoptysis , cough, dyspnea, wheezing, chest pain, palpitations, orthopnea, edema, abdominal pain, nausea, melena, diarrhea, constipation, flank pain, dysuria, hematuria, urinary  Frequency, nocturia, numbness, tingling, seizures,  Focal weakness, Loss of consciousness,  Tremor, insomnia, depression, anxiety, and suicidal ideation.      Objective:  BP 124/68   Pulse 85   Temp 97.5 F (36.4 C) (Oral)   Resp 12   Ht _0  (1.651 m)   Wt 174 lb 8 oz (79.2  kg)   SpO2 96%   BMI 29.04 kg/m   BP Readings from Last 3 Encounters:  09/02/16 124/68  07/07/16 132/88  05/25/16 132/69    Wt Readings from Last 3 Encounters:  09/02/16 174 lb 8 oz (79.2 kg)  07/07/16 177 lb 6.4 oz (80.5 kg)  04/30/16 177 lb 6 oz (80.5 kg)    General appearance: alert, cooperative and appears stated age Ears: normal TM's and external ear canals both ears Throat: lips, mucosa, and tongue normal; teeth and gums normal Neck: no adenopathy, no carotid bruit, supple, symmetrical, trachea midline and thyroid not enlarged, symmetric, no tenderness/mass/nodules Back: symmetric, no curvature. ROM normal. No CVA tenderness. Lungs: clear to auscultation bilaterally Heart: regular rate and rhythm, S1, S2 normal, no murmur, click, rub or gallop Abdomen: soft, non-tender; bowel sounds normal; no masses,  no organomegaly Pulses: 2+ and symmetric Skin:  Red macular rash under pannus and in inguinal golds.  Lymph nodes: Cervical, supraclavicular, and axillary nodes normal.  Lab Results  Component Value Date   HGBA1C 6.5 09/02/2016   HGBA1C 6.5 03/19/2016   HGBA1C 6.9 (H) 01/22/2016    Lab Results  Component Value Date   CREATININE 0.90 09/02/2016   CREATININE 1.0 04/24/2016   CREATININE 0.97 03/19/2016    Lab Results  Component Value Date   WBC 7.1 09/02/2016   HGB 14.9 09/02/2016   HCT 43.3 09/02/2016   PLT 191.0 09/02/2016   GLUCOSE 164 (H) 09/02/2016   CHOL 257 (H) 09/02/2016   TRIG 80.0 09/02/2016   HDL 76.10 09/02/2016   LDLDIRECT 144.0 03/19/2016   LDLCALC 165 (H) 09/02/2016   ALT 25 09/02/2016   AST 18 09/02/2016   NA 139 09/02/2016   K 4.3 09/02/2016   CL 105 09/02/2016   CREATININE 0.90 09/02/2016   BUN 16 09/02/2016   CO2 26 09/02/2016   TSH 2.82 09/02/2016   INR 1.0 03/14/2012   HGBA1C 6.5 09/02/2016   MICROALBUR 0.8 03/19/2016     Assessment & Plan:   Problem List Items Addressed This Visit    Candidiasis of skin    Not responding  to miconazole.  Trial of nystatin powder  Followed by gold bond with zinc      Relevant Medications   nystatin (MYCOSTATIN/NYSTOP) powder   Diabetes mellitus type 2, diet-controlled ([place]) - Primary     well-controlled on current medications.  hemoglobin A1c has been consistently at or  less than 7.0 . Patient is up-to-date on eye exams and foot exam is normal today. Patient has no evidence of microalbuminuria . Patient is intolerant of statin therapy for CAD risk reduction.  Lab Results  Component Value Date   HGBA1C 6.5 09/02/2016   Lab Results  Component Value Date   MICROALBUR 0.8 03/19/2016  Relevant Orders   Comprehensive metabolic panel   Hemoglobin A1c   Microalbumin / creatinine urine ratio   Hyperlipidemia    Untreated due to statin intolerance with history of multiple trials causiing myalgias.  Her current 10 yr risk of developing CAD based on Framingham criteria is 20% .  Lab Results  Component Value Date   CHOL 257 (H) 09/02/2016   HDL 76.10 09/02/2016   LDLCALC 165 (H) 09/02/2016   LDLDIRECT 144.0 03/19/2016   TRIG 80.0 09/02/2016   CHOLHDL 3 09/02/2016            Relevant Orders   LDL cholesterol, direct   Lipid panel   Onychomycosis    Treatment interrupted due to drug rash while taking terbinafine.  Recent biopsy showed minimal fungus growth      Relevant Medications   nystatin (MYCOSTATIN/NYSTOP) powder      I am having Ms. Leap start on nystatin. I am also having her maintain her aspirin, Blood Glucose Monitoring Suppl, fluocinonide cream, fluticasone, folic acid, Vitamin D3, azelastine, glucose blood, accu-chek multiclix, Biotin, levothyroxine, Sulfacetamide Sodium (Acne), cetirizine, miconazole, ipratropium, amoxicillin, metroNIDAZOLE, Fish Oil, and PREBIOTIC PRODUCT PO.  Meds ordered this encounter  Medications  . metroNIDAZOLE (METROGEL) 1 % gel    Sig: Apply 1 application topically daily.  . Omega-3 Fatty Acids (FISH  OIL) 1000 MG CAPS    Sig: Take 2 capsules by mouth daily.  Marland Kitchen PREBIOTIC PRODUCT PO    Sig: Take 1 scoop by mouth daily.  Marland Kitchen nystatin (MYCOSTATIN/NYSTOP) powder    Sig: Apply topically 2 (two) times daily. To irritated area    Dispense:  15 g    Refill:  2    There are no discontinued medications.  Follow-up: Return in about 4 months (around 01/04/2017) for follow up diabetes.   [name], MD

## 2016-09-02 NOTE — Patient Instructions (Addendum)
You are doing GREAT!    Your diabetes remains under excellent control  And your cholesterol and other labs are also normal. Please continue your current medications. return in aPRIL for follow up on diabetes and make sure you are seeing your eye doctor at least once a year for a dilated retina exam to monitor for diabetic retinopathy,. changes that can lead to blindness   iF YOU NEED A A REFERRAL TO DR DIGBY LET ME KNOW.    For your skin infection (candida: a yeast) that is not responding to miconazole:    Use nystatin powder twice daily irritated skin folds until rash has resolved.  Then switch to gold bond with zinc daily after shower  And place a pad in the fold to prevent airtight seal (yeast love this!)

## 2016-09-03 NOTE — Assessment & Plan Note (Signed)
Treatment interrupted due to drug rash while taking terbinafine.  Recent biopsy showed minimal fungus growth

## 2016-09-03 NOTE — Assessment & Plan Note (Addendum)
Untreated due to statin intolerance with history of multiple trials causiing myalgias.  Her current 10 yr risk of developing CAD based on Framingham criteria is 20% .  Lab Results  Component Value Date   CHOL 257 (H) 09/02/2016   HDL 76.10 09/02/2016   LDLCALC 165 (H) 09/02/2016   LDLDIRECT 144.0 03/19/2016   TRIG 80.0 09/02/2016   CHOLHDL 3 09/02/2016

## 2016-09-03 NOTE — Assessment & Plan Note (Signed)
well-controlled on current medications.  hemoglobin A1c has been consistently at or  less than 7.0 . Patient is up-to-date on eye exams and foot exam is normal today. Patient has no evidence of microalbuminuria . Patient is intolerant of statin therapy for CAD risk reduction.  Lab Results  Component Value Date   HGBA1C 6.5 09/02/2016   Lab Results  Component Value Date   MICROALBUR 0.8 03/19/2016

## 2016-09-03 NOTE — Assessment & Plan Note (Signed)
Not responding to miconazole.  Trial of nystatin powder  Followed by gold bond with zinc

## 2016-09-08 DIAGNOSIS — M5136 Other intervertebral disc degeneration, lumbar region: Secondary | ICD-10-CM | POA: Diagnosis not present

## 2016-09-08 DIAGNOSIS — M9905 Segmental and somatic dysfunction of pelvic region: Secondary | ICD-10-CM | POA: Diagnosis not present

## 2016-09-08 DIAGNOSIS — M9903 Segmental and somatic dysfunction of lumbar region: Secondary | ICD-10-CM | POA: Diagnosis not present

## 2016-09-08 DIAGNOSIS — M5416 Radiculopathy, lumbar region: Secondary | ICD-10-CM | POA: Diagnosis not present

## 2016-09-14 ENCOUNTER — Encounter: Payer: Self-pay | Admitting: Internal Medicine

## 2016-09-20 ENCOUNTER — Other Ambulatory Visit: Payer: Self-pay | Admitting: Internal Medicine

## 2016-09-29 DIAGNOSIS — M9903 Segmental and somatic dysfunction of lumbar region: Secondary | ICD-10-CM | POA: Diagnosis not present

## 2016-09-29 DIAGNOSIS — M5416 Radiculopathy, lumbar region: Secondary | ICD-10-CM | POA: Diagnosis not present

## 2016-09-29 DIAGNOSIS — M9905 Segmental and somatic dysfunction of pelvic region: Secondary | ICD-10-CM | POA: Diagnosis not present

## 2016-09-29 DIAGNOSIS — M5136 Other intervertebral disc degeneration, lumbar region: Secondary | ICD-10-CM | POA: Diagnosis not present

## 2016-10-08 DIAGNOSIS — C44519 Basal cell carcinoma of skin of other part of trunk: Secondary | ICD-10-CM | POA: Diagnosis not present

## 2016-10-08 DIAGNOSIS — L02416 Cutaneous abscess of left lower limb: Secondary | ICD-10-CM | POA: Diagnosis not present

## 2016-10-08 DIAGNOSIS — I788 Other diseases of capillaries: Secondary | ICD-10-CM | POA: Diagnosis not present

## 2016-10-08 DIAGNOSIS — D485 Neoplasm of uncertain behavior of skin: Secondary | ICD-10-CM | POA: Diagnosis not present

## 2016-10-20 DIAGNOSIS — M9905 Segmental and somatic dysfunction of pelvic region: Secondary | ICD-10-CM | POA: Diagnosis not present

## 2016-10-20 DIAGNOSIS — M9903 Segmental and somatic dysfunction of lumbar region: Secondary | ICD-10-CM | POA: Diagnosis not present

## 2016-10-20 DIAGNOSIS — M5416 Radiculopathy, lumbar region: Secondary | ICD-10-CM | POA: Diagnosis not present

## 2016-10-20 DIAGNOSIS — M5136 Other intervertebral disc degeneration, lumbar region: Secondary | ICD-10-CM | POA: Diagnosis not present

## 2016-10-29 ENCOUNTER — Ambulatory Visit (HOSPITAL_BASED_OUTPATIENT_CLINIC_OR_DEPARTMENT_OTHER): Payer: PPO | Admitting: Hematology & Oncology

## 2016-10-29 ENCOUNTER — Other Ambulatory Visit (HOSPITAL_BASED_OUTPATIENT_CLINIC_OR_DEPARTMENT_OTHER): Payer: PPO

## 2016-10-29 VITALS — BP 127/93 | HR 101 | Temp 97.9°F | Wt 173.1 lb

## 2016-10-29 DIAGNOSIS — D0510 Intraductal carcinoma in situ of unspecified breast: Secondary | ICD-10-CM

## 2016-10-29 DIAGNOSIS — Z853 Personal history of malignant neoplasm of breast: Secondary | ICD-10-CM

## 2016-10-29 DIAGNOSIS — I10 Essential (primary) hypertension: Secondary | ICD-10-CM | POA: Diagnosis not present

## 2016-10-29 DIAGNOSIS — D0591 Unspecified type of carcinoma in situ of right breast: Secondary | ICD-10-CM

## 2016-10-29 DIAGNOSIS — E119 Type 2 diabetes mellitus without complications: Secondary | ICD-10-CM

## 2016-10-29 LAB — COMPREHENSIVE METABOLIC PANEL
ALBUMIN: 4.2 g/dL (ref 3.5–5.0)
ALK PHOS: 71 U/L (ref 40–150)
ALT: 26 U/L (ref 0–55)
AST: 24 U/L (ref 5–34)
Anion Gap: 9 mEq/L (ref 3–11)
BUN: 17.3 mg/dL (ref 7.0–26.0)
CO2: 24 meq/L (ref 22–29)
Calcium: 10 mg/dL (ref 8.4–10.4)
Chloride: 106 mEq/L (ref 98–109)
Creatinine: 0.9 mg/dL (ref 0.6–1.1)
EGFR: 60 mL/min/{1.73_m2} — ABNORMAL LOW (ref >90)
GLUCOSE: 123 mg/dL (ref 70–140)
POTASSIUM: 4.2 meq/L (ref 3.5–5.1)
SODIUM: 138 meq/L (ref 136–145)
TOTAL PROTEIN: 7.5 g/dL (ref 6.4–8.3)
Total Bilirubin: 1.18 mg/dL (ref 0.20–1.20)

## 2016-10-29 LAB — CBC WITH DIFFERENTIAL (CANCER CENTER ONLY)
BASO#: 0.1 10*3/uL (ref 0.0–0.2)
BASO%: 0.9 % (ref 0.0–2.0)
EOS%: 3.2 % (ref 0.0–7.0)
Eosinophils Absolute: 0.2 10*3/uL (ref 0.0–0.5)
HCT: 45 % (ref 34.8–46.6)
HEMOGLOBIN: 15.6 g/dL (ref 11.6–15.9)
LYMPH#: 2.3 10*3/uL (ref 0.9–3.3)
LYMPH%: 30.8 % (ref 14.0–48.0)
MCH: 31.4 pg (ref 26.0–34.0)
MCHC: 34.7 g/dL (ref 32.0–36.0)
MCV: 91 fL (ref 81–101)
MONO#: 0.4 10*3/uL (ref 0.1–0.9)
MONO%: 5.2 % (ref 0.0–13.0)
NEUT%: 59.9 % (ref 39.6–80.0)
NEUTROS ABS: 4.5 10*3/uL (ref 1.5–6.5)
Platelets: 167 10*3/uL (ref 145–400)
RBC: 4.97 10*6/uL (ref 3.70–5.32)
RDW: 13.3 % (ref 11.1–15.7)
WBC: 7.5 10*3/uL (ref 3.9–10.0)

## 2016-10-29 NOTE — Progress Notes (Signed)
Hematology and Oncology Follow Up Visit  Jacqueline Mata [id number] [birth year] 76 y.o. 10/29/2016   Principle Diagnosis:  Synchronous bilateral stage IIA (T2 N0 M0) ductal carcinoma of bilateral breast.  Current Therapy:    Observation     Interim History:  Ms.  Mata is comes in for followup. She is doing quite well. She does have diet-controlled diabetes. She's been followed closely by Dr. Derrel Nip.  She had no proms over the holidays. She, as always, she'll need a lot of pictures of her 2 dogs. There are very cute.   She's had no issues with nausea or vomiting. She's had no problems with change in bowel or bladder habits. She had no cough.   Her last mammogram was back in May 2017. She is very diligent about getting these done yearly.   She's had no problems with increasing fatigue or weakness. Her weight is still on the higher side. Hopefully, she will be able to lose a little weight.   Overall, her performance status is ECOG 1.   Medications:  Current Outpatient Prescriptions:  .  amoxicillin (AMOXIL) 500 MG capsule, Take 1 capsule (500 mg total) by mouth 2 (two) times daily. (Patient not taking: Reported on 10/29/2016), Disp: 14 capsule, Rfl: 0 .  aspirin 81 MG tablet, Take 81 mg by mouth daily., Disp: , Rfl:  .  azelastine (ASTELIN) 0.1 % nasal spray, Place 2 sprays into both nostrils 2 (two) times daily as needed for rhinitis. Use in each nostril as directed (Patient not taking: Reported on 09/02/2016), Disp: 30 mL, Rfl: 5 .  Biotin 5000 MCG CAPS, Take 1 capsule by mouth daily., Disp: , Rfl:  .  Blood Glucose Monitoring Suppl KIT, by Does not apply route., Disp: , Rfl:  .  cetirizine (ZYRTEC) 10 MG tablet, Take 10 mg by mouth daily., Disp: , Rfl:  .  Cholecalciferol (VITAMIN D3) 1000 UNITS CAPS, Take 2 capsules by mouth daily. , Disp: , Rfl:  .  fluocinonide cream (LIDEX) 8.18 %, Apply 1 application topically daily as needed. Reported on 12/09/2015, Disp: , Rfl:  .  fluticasone  (FLONASE) 50 MCG/ACT nasal spray, Place into both nostrils daily. Place 2 spriay into both nostrils daily, Disp: , Rfl:  .  folic acid (FOLVITE) 1 MG tablet, Take 1 mg by mouth daily. Reported on 12/09/2015, Disp: , Rfl:  .  glucose blood (ACCU-CHEK AVIVA PLUS) test strip, Use to check blood sugar once daily., Disp: 100 each, Rfl: 12 .  ipratropium (ATROVENT) 0.06 % nasal spray, , Disp: , Rfl:  .  Lancets (ACCU-CHEK MULTICLIX) lancets, Use once daily to check blood sugars   Dx:E11.9, Disp: 100 each, Rfl: 3 .  levothyroxine (SYNTHROID, LEVOTHROID) 75 MCG tablet, TAKE 1 TABLET(75 MCG) BY MOUTH DAILY BEFORE BREAKFAST, Disp: 90 tablet, Rfl: 0 .  metroNIDAZOLE (METROGEL) 1 % gel, Apply 1 application topically daily., Disp: , Rfl:  .  miconazole (ZEASORB-AF) 2 % powder, Apply topically as needed for itching., Disp: , Rfl:  .  nystatin (MYCOSTATIN/NYSTOP) powder, Apply topically 2 (two) times daily. To irritated area, Disp: 15 g, Rfl: 2 .  Omega-3 Fatty Acids (FISH OIL) 1000 MG CAPS, Take 2 capsules by mouth daily., Disp: , Rfl:  .  PREBIOTIC PRODUCT PO, Take 1 scoop by mouth daily., Disp: , Rfl:  .  Sulfacetamide Sodium, Acne, 10 % LOTN, APPLY TOPICALLY TO FACE BID, Disp: , Rfl: 3  Allergies:  Allergies  Allergen Reactions  . Ilevro [Nepafenac]   .  Lamisil [Terbinafine Hcl] Rash and Other (See Comments)    Dysphagia and skin fungus  . Adhesive [Tape] Dermatitis  . Codeine   . Mold Extract [Trichophyton Mentagrophyte]   . Neomycin   . Nucynta [Tapentadol] Nausea And Vomiting  . Neomycin-Polymyxin-Hc Rash  . Tramadol     dizziness    Past Medical History, Surgical history, Social history, and Family History were reviewed and updated.  Review of Systems: As above  Physical Exam:  weight is 173 lb 1 oz (78.5 kg). Her oral temperature is 97.9 F (36.6 C). Her blood pressure is 127/93 (abnormal) and her pulse is 101 (abnormal).   Well-developed and well-nourished white female. Head and exam  has no ocular or oral lesions. She has no palpable cervical or supraclavicular lymph nodes. Lungs are clear bilateral. Cardiac exam regular rate and rhythm with no murmurs rubs or bruits. Abdomen is soft. Has good bowel sounds. There is no fluid wave. Is a palpable liver or spleen tip. Breast exam shows left breast with well-healed lumpectomy at the 9:00 position. No masses noted in the left breast. There is no left axillary adenopathy. Right breast is somewhat contracted from radiation and surgery. She has a well-healed lumpectomy at the 4:00 position. There is no mass in the right breast. There is no right axillary adenopathy. Extremities shows no clubbing cyanosis or edema. Neurological exam shows no focal neurological deficits. Skin exam no rashes ecchymosis or petechia. Lab Results  Component Value Date   WBC 7.5 10/29/2016   HGB 15.6 10/29/2016   HCT 45.0 10/29/2016   MCV 91 10/29/2016   PLT 167 10/29/2016     Chemistry      Component Value Date/Time   NA 139 09/02/2016 0807   NA 140 04/24/2016 1013   K 4.3 09/02/2016 0807   K 4.1 04/24/2016 1013   CL 105 09/02/2016 0807   CL 107 04/01/2012 0336   CO2 26 09/02/2016 0807   CO2 21 (L) 04/24/2016 1013   BUN 16 09/02/2016 0807   BUN 11.0 04/24/2016 1013   CREATININE 0.90 09/02/2016 0807   CREATININE 1.0 04/24/2016 1013      Component Value Date/Time   CALCIUM 9.5 09/02/2016 0807   CALCIUM 9.6 04/24/2016 1013   ALKPHOS 59 09/02/2016 0807   ALKPHOS 72 04/24/2016 1013   AST 18 09/02/2016 0807   AST 22 04/24/2016 1013   ALT 25 09/02/2016 0807   ALT 30 04/24/2016 1013   BILITOT 0.8 09/02/2016 0807   BILITOT 0.67 04/24/2016 1013         Impression and Plan: Jacqueline Mata is 76 year old white female with history of synchronous bilateral breast cancer. Fortunately both breast cancers were node negative.  For now, we will plan to get her back in another 6 months.  It has been 121/2 years since she had treatment. Again she's  done incredibly well. I really think that she is cured.   Volanda Napoleon, MD 2/1/201811:21 AM

## 2016-11-10 DIAGNOSIS — M5136 Other intervertebral disc degeneration, lumbar region: Secondary | ICD-10-CM | POA: Diagnosis not present

## 2016-11-10 DIAGNOSIS — M5416 Radiculopathy, lumbar region: Secondary | ICD-10-CM | POA: Diagnosis not present

## 2016-11-10 DIAGNOSIS — M9905 Segmental and somatic dysfunction of pelvic region: Secondary | ICD-10-CM | POA: Diagnosis not present

## 2016-11-10 DIAGNOSIS — M9903 Segmental and somatic dysfunction of lumbar region: Secondary | ICD-10-CM | POA: Diagnosis not present

## 2016-11-13 DIAGNOSIS — C44519 Basal cell carcinoma of skin of other part of trunk: Secondary | ICD-10-CM | POA: Diagnosis not present

## 2016-11-13 DIAGNOSIS — L538 Other specified erythematous conditions: Secondary | ICD-10-CM | POA: Diagnosis not present

## 2016-11-13 DIAGNOSIS — L820 Inflamed seborrheic keratosis: Secondary | ICD-10-CM | POA: Diagnosis not present

## 2016-12-02 DIAGNOSIS — M5136 Other intervertebral disc degeneration, lumbar region: Secondary | ICD-10-CM | POA: Diagnosis not present

## 2016-12-02 DIAGNOSIS — M9905 Segmental and somatic dysfunction of pelvic region: Secondary | ICD-10-CM | POA: Diagnosis not present

## 2016-12-02 DIAGNOSIS — M5416 Radiculopathy, lumbar region: Secondary | ICD-10-CM | POA: Diagnosis not present

## 2016-12-02 DIAGNOSIS — M9903 Segmental and somatic dysfunction of lumbar region: Secondary | ICD-10-CM | POA: Diagnosis not present

## 2016-12-08 ENCOUNTER — Ambulatory Visit: Payer: PPO

## 2016-12-15 DIAGNOSIS — E119 Type 2 diabetes mellitus without complications: Secondary | ICD-10-CM | POA: Diagnosis not present

## 2016-12-15 LAB — HM DIABETES EYE EXAM

## 2016-12-22 DIAGNOSIS — M9903 Segmental and somatic dysfunction of lumbar region: Secondary | ICD-10-CM | POA: Diagnosis not present

## 2016-12-22 DIAGNOSIS — M5416 Radiculopathy, lumbar region: Secondary | ICD-10-CM | POA: Diagnosis not present

## 2016-12-22 DIAGNOSIS — M5136 Other intervertebral disc degeneration, lumbar region: Secondary | ICD-10-CM | POA: Diagnosis not present

## 2016-12-22 DIAGNOSIS — M9905 Segmental and somatic dysfunction of pelvic region: Secondary | ICD-10-CM | POA: Diagnosis not present

## 2016-12-23 ENCOUNTER — Other Ambulatory Visit: Payer: Self-pay | Admitting: Internal Medicine

## 2016-12-31 ENCOUNTER — Ambulatory Visit (INDEPENDENT_AMBULATORY_CARE_PROVIDER_SITE_OTHER): Payer: PPO | Admitting: Internal Medicine

## 2016-12-31 ENCOUNTER — Encounter: Payer: Self-pay | Admitting: Internal Medicine

## 2016-12-31 DIAGNOSIS — E782 Mixed hyperlipidemia: Secondary | ICD-10-CM | POA: Diagnosis not present

## 2016-12-31 DIAGNOSIS — E119 Type 2 diabetes mellitus without complications: Secondary | ICD-10-CM

## 2016-12-31 DIAGNOSIS — E039 Hypothyroidism, unspecified: Secondary | ICD-10-CM

## 2016-12-31 LAB — COMPREHENSIVE METABOLIC PANEL
ALBUMIN: 4.3 g/dL (ref 3.5–5.2)
ALT: 33 U/L (ref 0–35)
AST: 27 U/L (ref 0–37)
Alkaline Phosphatase: 57 U/L (ref 39–117)
BUN: 14 mg/dL (ref 6–23)
CO2: 24 mEq/L (ref 19–32)
Calcium: 9.4 mg/dL (ref 8.4–10.5)
Chloride: 106 mEq/L (ref 96–112)
Creatinine, Ser: 0.87 mg/dL (ref 0.40–1.20)
GFR: 67.46 mL/min (ref >60.00)
Glucose, Bld: 140 mg/dL — ABNORMAL HIGH (ref 70–99)
Potassium: 3.9 mEq/L (ref 3.5–5.1)
Sodium: 137 mEq/L (ref 135–145)
TOTAL PROTEIN: 7.2 g/dL (ref 6.0–8.3)
Total Bilirubin: 0.6 mg/dL (ref 0.2–1.2)

## 2016-12-31 LAB — MICROALBUMIN / CREATININE URINE RATIO
Creatinine,U: 103.3 mg/dL
MICROALB/CREAT RATIO: 0.7 mg/g (ref 0.0–30.0)
Microalb, Ur: <0.7 mg/dL (ref 0.0–1.9)

## 2016-12-31 LAB — LIPID PANEL
CHOLESTEROL: 232 mg/dL — AB (ref 0–200)
HDL: 74.2 mg/dL (ref >39.00)
LDL Cholesterol: 144 mg/dL — ABNORMAL HIGH (ref 0–99)
NonHDL: 158.21
TRIGLYCERIDES: 73 mg/dL (ref 0.0–149.0)
Total CHOL/HDL Ratio: 3
VLDL: 14.6 mg/dL (ref 0.0–40.0)

## 2016-12-31 LAB — HEMOGLOBIN A1C: HEMOGLOBIN A1C: 6.7 % — AB (ref 4.6–6.5)

## 2016-12-31 LAB — LDL CHOLESTEROL, DIRECT: Direct LDL: 138 mg/dL

## 2016-12-31 NOTE — Progress Notes (Signed)
Pre visit review using our clinic review tool, if applicable. No additional management support is needed unless otherwise documented below in the visit note. 

## 2016-12-31 NOTE — Progress Notes (Signed)
Subjective:  Patient ID: Jacqueline Mata, female    DOB: 1941/05/29  Age: 76 y.o. MRN: [id number]  CC: Diagnoses of Diabetes mellitus type 2, diet-controlled ([place]), Mixed hyperlipidemia, and Acquired hypothyroidism were pertinent to this visit.  HPI Jacqueline Mata presents for 3 month follow up on diabetes.  Patient has no complaints today.  Patient is following a low glycemic index diet and taking all prescribed medications regularly without side effects.  Fasting sugars have been under less than 140 most of the time and  Does not check post prandials. . Patient is not exercising about 3 times per week and intentionally trying to lose weight .  Patient has had an eye exam in the last 12 months and checks feet regularly for signs of infection.  Patient does not walk barefoot outside,  And denies an numbness tingling or burning in feet. Patient is up to date on all recommended vaccinations  Foot exam done:  Sensation intact,  Has a callous 2nd MT head left foot treated for toenial fungus with kerasol (allergy to terbinafine_)  Sleep is interrupted by frequent wakenings.rare trouble returning to sleep,  2 small dogs.  Some balance issues,,  No falls, had vestibular rehab program at [place] .   Started with use of tramadol prior to her knee surgery.   Recent stye,  Resolved without abx, Attributed to rosacea and dry eye.  Used warm compresses.    Lab Results  Component Value Date   HGBA1C 6.7 (H) 12/31/2016     Outpatient Medications Prior to Visit  Medication Sig Dispense Refill  . aspirin 81 MG tablet Take 81 mg by mouth daily.    Marland Kitchen azelastine (ASTELIN) 0.1 % nasal spray Place 2 sprays into both nostrils 2 (two) times daily as needed for rhinitis. Use in each nostril as directed 30 mL 5  . Blood Glucose Monitoring Suppl KIT by Does not apply route.    . cetirizine (ZYRTEC) 10 MG tablet Take 10 mg by mouth daily.    . Cholecalciferol (VITAMIN D3) 1000 UNITS CAPS Take 2 capsules by mouth  daily.     . fluocinonide cream (LIDEX) 7.82 % Apply 1 application topically daily as needed. Reported on 9/56/2130    . folic acid (FOLVITE) 1 MG tablet Take 1 mg by mouth daily. Reported on 12/09/2015    . glucose blood (ACCU-CHEK AVIVA PLUS) test strip Use to check blood sugar once daily. 100 each 12  . ipratropium (ATROVENT) 0.06 % nasal spray     . Lancets (ACCU-CHEK MULTICLIX) lancets Use once daily to check blood sugars   Dx:E11.9 100 each 3  . levothyroxine (SYNTHROID, LEVOTHROID) 75 MCG tablet TAKE 1 TABLET(75 MCG) BY MOUTH DAILY BEFORE BREAKFAST 90 tablet 0  . metroNIDAZOLE (METROGEL) 1 % gel Apply 1 application topically daily.    Marland Kitchen nystatin (MYCOSTATIN/NYSTOP) powder Apply topically 2 (two) times daily. To irritated area 15 g 2  . Omega-3 Fatty Acids (FISH OIL) 1000 MG CAPS Take 2 capsules by mouth daily.    Marland Kitchen PREBIOTIC PRODUCT PO Take 1 scoop by mouth daily.    Marland Kitchen amoxicillin (AMOXIL) 500 MG capsule Take 1 capsule (500 mg total) by mouth 2 (two) times daily. (Patient not taking: Reported on 10/29/2016) 14 capsule 0  . fluticasone (FLONASE) 50 MCG/ACT nasal spray Place into both nostrils daily. Place 2 spriay into both nostrils daily    . Biotin 5000 MCG CAPS Take 1 capsule by mouth daily.    Marland Kitchen  miconazole (ZEASORB-AF) 2 % powder Apply topically as needed for itching.    . Sulfacetamide Sodium, Acne, 10 % LOTN APPLY TOPICALLY TO FACE BID  3   No facility-administered medications prior to visit.     Review of Systems;  Patient denies headache, fevers, malaise, unintentional weight loss, skin rash, eye pain, sinus congestion and sinus pain, sore throat, dysphagia,  hemoptysis , cough, dyspnea, wheezing, chest pain, palpitations, orthopnea, edema, abdominal pain, nausea, melena, diarrhea, constipation, flank pain, dysuria, hematuria, urinary  Frequency, nocturia, numbness, tingling, seizures,  Focal weakness, Loss of consciousness,  Tremor, insomnia, depression, anxiety, and suicidal  ideation.      Objective:  BP 124/82   Pulse 73   Temp 98.1 F (36.7 C) (Oral)   Resp 15   Ht '5\' 5"'$  (1.651 m)   Wt 173 lb 9.6 oz (78.7 kg)   SpO2 98%   BMI 28.89 kg/m   BP Readings from Last 3 Encounters:  12/31/16 124/82  10/29/16 (!) 127/93  09/02/16 124/68    Wt Readings from Last 3 Encounters:  12/31/16 173 lb 9.6 oz (78.7 kg)  10/29/16 173 lb 1 oz (78.5 kg)  09/02/16 174 lb 8 oz (79.2 kg)    General appearance: alert, cooperative and appears stated age Ears: normal TM's and external ear canals both ears Throat: lips, mucosa, and tongue normal; teeth and gums normal Neck: no adenopathy, no carotid bruit, supple, symmetrical, trachea midline and thyroid not enlarged, symmetric, no tenderness/mass/nodules Back: symmetric, no curvature. ROM normal. No CVA tenderness. Lungs: clear to auscultation bilaterally Heart: regular rate and rhythm, S1, S2 normal, no murmur, click, rub or gallop Abdomen: soft, non-tender; bowel sounds normal; no masses,  no organomegaly Pulses: 2+ and symmetric Skin: Skin color, texture, turgor normal. No rashes or lesions Lymph nodes: Cervical, supraclavicular, and axillary nodes normal.  Lab Results  Component Value Date   HGBA1C 6.7 (H) 12/31/2016   HGBA1C 6.5 09/02/2016   HGBA1C 6.5 03/19/2016    Lab Results  Component Value Date   CREATININE 0.87 12/31/2016   CREATININE 0.9 10/29/2016   CREATININE 0.90 09/02/2016    Lab Results  Component Value Date   WBC 7.5 10/29/2016   HGB 15.6 10/29/2016   HCT 45.0 10/29/2016   PLT 167 10/29/2016   GLUCOSE 140 (H) 12/31/2016   CHOL 232 (H) 12/31/2016   TRIG 73.0 12/31/2016   HDL 74.20 12/31/2016   LDLDIRECT 138.0 12/31/2016   LDLCALC 144 (H) 12/31/2016   ALT 33 12/31/2016   AST 27 12/31/2016   NA 137 12/31/2016   K 3.9 12/31/2016   CL 106 12/31/2016   CREATININE 0.87 12/31/2016   BUN 14 12/31/2016   CO2 24 12/31/2016   TSH 2.82 09/02/2016   INR 1.0 03/14/2012   HGBA1C 6.7 (H)  12/31/2016   MICROALBUR <0.7 12/31/2016      Assessment & Plan:   Problem List Items Addressed This Visit    Acquired hypothyroidism    Thyroid function is WNL on current dose.  No current changes needed.   Lab Results  Component Value Date   TSH 2.82 09/02/2016         Diabetes mellitus type 2, diet-controlled ([place])     well-controlled on current medications.  hemoglobin A1c has been consistently at or  less than 7.0 . Patient is up-to-date on eye exams and foot exam is normal today. Patient has no evidence of microalbuminuria . Patient is intolerant of statin therapy for CAD risk reduction.  Lab Results  Component Value Date   HGBA1C 6.7 (H) 12/31/2016   Lab Results  Component Value Date   MICROALBUR <0.7 12/31/2016           Hyperlipidemia    Untreated due to statin intolerance with history of multiple trials causiing myalgias.  Her current 10 yr risk of developing CAD based on Framingham criteria is 20% .  Lab Results  Component Value Date   CHOL 232 (H) 12/31/2016   HDL 74.20 12/31/2016   LDLCALC 144 (H) 12/31/2016   LDLDIRECT 138.0 12/31/2016   TRIG 73.0 12/31/2016   CHOLHDL 3 12/31/2016              A total of 25 minutes of face to face time was spent with patient more than half of which was spent in counselling about the above mentioned conditions  and coordination of care   I have discontinued Jacqueline Mata's Biotin, Sulfacetamide Sodium (Acne), and miconazole. I am also having her maintain her aspirin, Blood Glucose Monitoring Suppl, fluocinonide cream, fluticasone, folic acid, Vitamin D3, azelastine, glucose blood, accu-chek multiclix, cetirizine, ipratropium, amoxicillin, metroNIDAZOLE, Fish Oil, PREBIOTIC PRODUCT PO, nystatin, and levothyroxine.  No orders of the defined types were placed in this encounter.   Medications Discontinued During This Encounter  Medication Reason  . Biotin 5000 MCG CAPS Patient has not taken in last 30 days  .  miconazole (ZEASORB-AF) 2 % powder Patient has not taken in last 30 days  . Sulfacetamide Sodium, Acne, 10 % LOTN Patient has not taken in last 30 days    Follow-up: Return in about 3 months (around 04/01/2017) for 90+ days , follow up diabetes.   [name], MD

## 2016-12-31 NOTE — Patient Instructions (Signed)
Good to see you!   Diabetes Mellitus and Exercise Exercising regularly is important for your overall health, especially when you have diabetes (diabetes mellitus). Exercising is not only about losing weight. It has many health benefits, such as increasing muscle strength and bone density and reducing body fat and stress. This leads to improved fitness, flexibility, and endurance, all of which result in better overall health. Exercise has additional benefits for people with diabetes, including:  Reducing appetite.  Helping to lower and control blood glucose.  Lowering blood pressure.  Helping to control amounts of fatty substances (lipids) in the blood, such as cholesterol and triglycerides.  Helping the body to respond better to insulin (improving insulin sensitivity).  Reducing how much insulin the body needs.  Decreasing the risk for heart disease by:  Lowering cholesterol and triglyceride levels.  Increasing the levels of good cholesterol.  Lowering blood glucose levels. What is my activity plan? Your health care provider or certified diabetes educator can help you make a plan for the type and frequency of exercise (activity plan) that works for you. Make sure that you:  Do at least 150 minutes of moderate-intensity or vigorous-intensity exercise each week. This could be brisk walking, biking, or water aerobics.  Do stretching and strength exercises, such as yoga or weightlifting, at least 2 times a week.  Spread out your activity over at least 3 days of the week.  Get some form of physical activity every day.  Do not go more than 2 days in a row without some kind of physical activity.  Avoid being inactive for more than 90 minutes at a time. Take frequent breaks to walk or stretch.  Choose a type of exercise or activity that you enjoy, and set realistic goals.  Start slowly, and gradually increase the intensity of your exercise over time. What do I need to know about  managing my diabetes?  Check your blood glucose before and after exercising.  If your blood glucose is higher than 240 mg/dL (13.3 mmol/L) before you exercise, check your urine for ketones. If you have ketones in your urine, do not exercise until your blood glucose returns to normal.  Know the symptoms of low blood glucose (hypoglycemia) and how to treat it. Your risk for hypoglycemia increases during and after exercise. Common symptoms of hypoglycemia can include:  Hunger.  Anxiety.  Sweating and feeling clammy.  Confusion.  Dizziness or feeling light-headed.  Increased heart rate or palpitations.  Blurry vision.  Tingling or numbness around the mouth, lips, or tongue.  Tremors or shakes.  Irritability.  Keep a rapid-acting carbohydrate snack available before, during, and after exercise to help prevent or treat hypoglycemia.  Avoid injecting insulin into areas of the body that are going to be exercised. For example, avoid injecting insulin into:  The arms, when playing tennis.  The legs, when jogging.  Keep records of your exercise habits. Doing this can help you and your health care provider adjust your diabetes management plan as needed. Write down:  Food that you eat before and after you exercise.  Blood glucose levels before and after you exercise.  The type and amount of exercise you have done.  When your insulin is expected to peak, if you use insulin. Avoid exercising at times when your insulin is peaking.  When you start a new exercise or activity, work with your health care provider to make sure the activity is safe for you, and to adjust your insulin, medicines, or food  intake as needed.  Drink plenty of water while you exercise to prevent dehydration or heat stroke. Drink enough fluid to keep your urine clear or pale yellow. This information is not intended to replace advice given to you by your health care provider. Make sure you discuss any questions you  have with your health care provider. Document Released: 12/05/2003 Document Revised: 04/03/2016 Document Reviewed: 02/24/2016 Elsevier Interactive Patient Education  2017 [employer].

## 2017-01-02 NOTE — Assessment & Plan Note (Signed)
well-controlled on current medications.  hemoglobin A1c has been consistently at or  less than 7.0 . Patient is up-to-date on eye exams and foot exam is normal today. Patient has no evidence of microalbuminuria . Patient is intolerant of statin therapy for CAD risk reduction.  Lab Results  Component Value Date   HGBA1C 6.7 (H) 12/31/2016   Lab Results  Component Value Date   MICROALBUR <0.7 12/31/2016

## 2017-01-02 NOTE — Assessment & Plan Note (Signed)
Untreated due to statin intolerance with history of multiple trials causiing myalgias.  Her current 10 yr risk of developing CAD based on Framingham criteria is 20% .  Lab Results  Component Value Date   CHOL 232 (H) 12/31/2016   HDL 74.20 12/31/2016   LDLCALC 144 (H) 12/31/2016   LDLDIRECT 138.0 12/31/2016   TRIG 73.0 12/31/2016   CHOLHDL 3 12/31/2016

## 2017-01-02 NOTE — Assessment & Plan Note (Signed)
Thyroid function is WNL on current dose.  No current changes needed.   Lab Results  Component Value Date   TSH 2.82 09/02/2016

## 2017-01-03 ENCOUNTER — Encounter: Payer: Self-pay | Admitting: Internal Medicine

## 2017-01-12 DIAGNOSIS — M9905 Segmental and somatic dysfunction of pelvic region: Secondary | ICD-10-CM | POA: Diagnosis not present

## 2017-01-12 DIAGNOSIS — M9903 Segmental and somatic dysfunction of lumbar region: Secondary | ICD-10-CM | POA: Diagnosis not present

## 2017-01-12 DIAGNOSIS — M5416 Radiculopathy, lumbar region: Secondary | ICD-10-CM | POA: Diagnosis not present

## 2017-01-12 DIAGNOSIS — M5136 Other intervertebral disc degeneration, lumbar region: Secondary | ICD-10-CM | POA: Diagnosis not present

## 2017-02-02 DIAGNOSIS — M9905 Segmental and somatic dysfunction of pelvic region: Secondary | ICD-10-CM | POA: Diagnosis not present

## 2017-02-02 DIAGNOSIS — M5416 Radiculopathy, lumbar region: Secondary | ICD-10-CM | POA: Diagnosis not present

## 2017-02-02 DIAGNOSIS — M9903 Segmental and somatic dysfunction of lumbar region: Secondary | ICD-10-CM | POA: Diagnosis not present

## 2017-02-02 DIAGNOSIS — M5136 Other intervertebral disc degeneration, lumbar region: Secondary | ICD-10-CM | POA: Diagnosis not present

## 2017-02-23 DIAGNOSIS — M9903 Segmental and somatic dysfunction of lumbar region: Secondary | ICD-10-CM | POA: Diagnosis not present

## 2017-02-23 DIAGNOSIS — M9905 Segmental and somatic dysfunction of pelvic region: Secondary | ICD-10-CM | POA: Diagnosis not present

## 2017-02-23 DIAGNOSIS — M5136 Other intervertebral disc degeneration, lumbar region: Secondary | ICD-10-CM | POA: Diagnosis not present

## 2017-02-23 DIAGNOSIS — M5416 Radiculopathy, lumbar region: Secondary | ICD-10-CM | POA: Diagnosis not present

## 2017-02-24 DIAGNOSIS — Z853 Personal history of malignant neoplasm of breast: Secondary | ICD-10-CM | POA: Diagnosis not present

## 2017-02-24 DIAGNOSIS — Z1231 Encounter for screening mammogram for malignant neoplasm of breast: Secondary | ICD-10-CM | POA: Diagnosis not present

## 2017-02-24 LAB — HM MAMMOGRAPHY

## 2017-02-26 ENCOUNTER — Encounter: Payer: Self-pay | Admitting: Hematology & Oncology

## 2017-03-16 DIAGNOSIS — M5416 Radiculopathy, lumbar region: Secondary | ICD-10-CM | POA: Diagnosis not present

## 2017-03-16 DIAGNOSIS — M5136 Other intervertebral disc degeneration, lumbar region: Secondary | ICD-10-CM | POA: Diagnosis not present

## 2017-03-16 DIAGNOSIS — M9905 Segmental and somatic dysfunction of pelvic region: Secondary | ICD-10-CM | POA: Diagnosis not present

## 2017-03-16 DIAGNOSIS — M9903 Segmental and somatic dysfunction of lumbar region: Secondary | ICD-10-CM | POA: Diagnosis not present

## 2017-03-22 ENCOUNTER — Other Ambulatory Visit: Payer: Self-pay | Admitting: Internal Medicine

## 2017-04-06 DIAGNOSIS — M5416 Radiculopathy, lumbar region: Secondary | ICD-10-CM | POA: Diagnosis not present

## 2017-04-06 DIAGNOSIS — M5033 Other cervical disc degeneration, cervicothoracic region: Secondary | ICD-10-CM | POA: Diagnosis not present

## 2017-04-06 DIAGNOSIS — M9901 Segmental and somatic dysfunction of cervical region: Secondary | ICD-10-CM | POA: Diagnosis not present

## 2017-04-06 DIAGNOSIS — M9905 Segmental and somatic dysfunction of pelvic region: Secondary | ICD-10-CM | POA: Diagnosis not present

## 2017-04-06 DIAGNOSIS — M9902 Segmental and somatic dysfunction of thoracic region: Secondary | ICD-10-CM | POA: Diagnosis not present

## 2017-04-06 DIAGNOSIS — R51 Headache: Secondary | ICD-10-CM | POA: Diagnosis not present

## 2017-04-06 DIAGNOSIS — M9903 Segmental and somatic dysfunction of lumbar region: Secondary | ICD-10-CM | POA: Diagnosis not present

## 2017-04-06 DIAGNOSIS — M5136 Other intervertebral disc degeneration, lumbar region: Secondary | ICD-10-CM | POA: Diagnosis not present

## 2017-04-13 DIAGNOSIS — H0011 Chalazion right upper eyelid: Secondary | ICD-10-CM | POA: Diagnosis not present

## 2017-04-15 DIAGNOSIS — Z85828 Personal history of other malignant neoplasm of skin: Secondary | ICD-10-CM | POA: Diagnosis not present

## 2017-04-15 DIAGNOSIS — L02416 Cutaneous abscess of left lower limb: Secondary | ICD-10-CM | POA: Diagnosis not present

## 2017-04-15 DIAGNOSIS — D225 Melanocytic nevi of trunk: Secondary | ICD-10-CM | POA: Diagnosis not present

## 2017-04-15 DIAGNOSIS — H00011 Hordeolum externum right upper eyelid: Secondary | ICD-10-CM | POA: Diagnosis not present

## 2017-04-15 DIAGNOSIS — L821 Other seborrheic keratosis: Secondary | ICD-10-CM | POA: Diagnosis not present

## 2017-04-20 DIAGNOSIS — M7062 Trochanteric bursitis, left hip: Secondary | ICD-10-CM | POA: Diagnosis not present

## 2017-04-20 DIAGNOSIS — G8929 Other chronic pain: Secondary | ICD-10-CM | POA: Diagnosis not present

## 2017-04-20 DIAGNOSIS — M25552 Pain in left hip: Secondary | ICD-10-CM | POA: Diagnosis not present

## 2017-04-20 DIAGNOSIS — Z96652 Presence of left artificial knee joint: Secondary | ICD-10-CM | POA: Diagnosis not present

## 2017-04-27 DIAGNOSIS — R51 Headache: Secondary | ICD-10-CM | POA: Diagnosis not present

## 2017-04-27 DIAGNOSIS — M5033 Other cervical disc degeneration, cervicothoracic region: Secondary | ICD-10-CM | POA: Diagnosis not present

## 2017-04-27 DIAGNOSIS — M9902 Segmental and somatic dysfunction of thoracic region: Secondary | ICD-10-CM | POA: Diagnosis not present

## 2017-04-27 DIAGNOSIS — M9905 Segmental and somatic dysfunction of pelvic region: Secondary | ICD-10-CM | POA: Diagnosis not present

## 2017-04-27 DIAGNOSIS — M5416 Radiculopathy, lumbar region: Secondary | ICD-10-CM | POA: Diagnosis not present

## 2017-04-27 DIAGNOSIS — M9903 Segmental and somatic dysfunction of lumbar region: Secondary | ICD-10-CM | POA: Diagnosis not present

## 2017-04-27 DIAGNOSIS — M9901 Segmental and somatic dysfunction of cervical region: Secondary | ICD-10-CM | POA: Diagnosis not present

## 2017-04-27 DIAGNOSIS — M5136 Other intervertebral disc degeneration, lumbar region: Secondary | ICD-10-CM | POA: Diagnosis not present

## 2017-04-29 ENCOUNTER — Ambulatory Visit (HOSPITAL_BASED_OUTPATIENT_CLINIC_OR_DEPARTMENT_OTHER): Payer: PPO | Admitting: Hematology & Oncology

## 2017-04-29 ENCOUNTER — Other Ambulatory Visit (HOSPITAL_BASED_OUTPATIENT_CLINIC_OR_DEPARTMENT_OTHER): Payer: PPO

## 2017-04-29 VITALS — BP 116/81 | HR 71 | Temp 98.3°F | Resp 16 | Wt 177.0 lb

## 2017-04-29 DIAGNOSIS — E119 Type 2 diabetes mellitus without complications: Secondary | ICD-10-CM | POA: Diagnosis not present

## 2017-04-29 DIAGNOSIS — D0510 Intraductal carcinoma in situ of unspecified breast: Secondary | ICD-10-CM

## 2017-04-29 DIAGNOSIS — Z853 Personal history of malignant neoplasm of breast: Secondary | ICD-10-CM

## 2017-04-29 DIAGNOSIS — Z170 Estrogen receptor positive status [ER+]: Principal | ICD-10-CM

## 2017-04-29 DIAGNOSIS — C50919 Malignant neoplasm of unspecified site of unspecified female breast: Secondary | ICD-10-CM | POA: Insufficient documentation

## 2017-04-29 LAB — CMP (CANCER CENTER ONLY)
ALBUMIN: 3.7 g/dL (ref 3.3–5.5)
ALT(SGPT): 37 U/L (ref 10–47)
AST: 41 U/L — AB (ref 11–38)
Alkaline Phosphatase: 64 U/L (ref 26–84)
BILIRUBIN TOTAL: 1.3 mg/dL (ref 0.20–1.60)
BUN: 9 mg/dL (ref 7–22)
CO2: 27 mEq/L (ref 18–33)
CREATININE: 0.9 mg/dL (ref 0.6–1.2)
Calcium: 9.2 mg/dL (ref 8.0–10.3)
Chloride: 102 mEq/L (ref 98–108)
Glucose, Bld: 120 mg/dL — ABNORMAL HIGH (ref 73–118)
Potassium: 3.7 mEq/L (ref 3.3–4.7)
SODIUM: 135 meq/L (ref 128–145)
Total Protein: 7.2 g/dL (ref 6.4–8.1)

## 2017-04-29 LAB — CBC WITH DIFFERENTIAL (CANCER CENTER ONLY)
BASO#: 0.1 10*3/uL (ref 0.0–0.2)
BASO%: 0.6 % (ref 0.0–2.0)
EOS%: 3.1 % (ref 0.0–7.0)
Eosinophils Absolute: 0.3 10*3/uL (ref 0.0–0.5)
HCT: 44.3 % (ref 34.8–46.6)
HEMOGLOBIN: 15.4 g/dL (ref 11.6–15.9)
LYMPH#: 2.6 10*3/uL (ref 0.9–3.3)
LYMPH%: 32.7 % (ref 14.0–48.0)
MCH: 31.8 pg (ref 26.0–34.0)
MCHC: 34.8 g/dL (ref 32.0–36.0)
MCV: 92 fL (ref 81–101)
MONO#: 0.5 10*3/uL (ref 0.1–0.9)
MONO%: 6 % (ref 0.0–13.0)
NEUT%: 57.6 % (ref 39.6–80.0)
NEUTROS ABS: 4.6 10*3/uL (ref 1.5–6.5)
Platelets: 147 10*3/uL (ref 145–400)
RBC: 4.84 10*6/uL (ref 3.70–5.32)
RDW: 12.9 % (ref 11.1–15.7)
WBC: 8 10*3/uL (ref 3.9–10.0)

## 2017-04-29 NOTE — Progress Notes (Signed)
Hematology and Oncology Follow Up Visit  Jacqueline Mata [id number] 06-23-1941 76 y.o. 04/29/2017   Principle Diagnosis:  Synchronous bilateral stage IIA (T2 N0 M0) ductal carcinoma of bilateral breast - ER+/HER2-.  Current Therapy:    Observation     Interim History:  Ms.  Mata is comes in for followup. She is doing quite well. She does have diet-controlled diabetes. She's been followed closely by Dr. Derrel Nip.  She is on a special diet. She has not lost weight yet. She is starting to exercise. She goes to a fitness facility.  One of Jacqueline Mata 2 dogs now has heart failure. This is somewhat stressful for Jacqueline Mata. She has to give Jacqueline Mata little dog several pills.  She has not had any problems herself. She's had no issues over the winter or spring. She's had no influenza. She's had no cough. His no shortness of breath. She's had no nausea or vomiting. She's had no diarrhea area and there's been no rashes. She's had no leg swelling.   Overall, Jacqueline Mata performance status is ECOG 1.   Medications:  Current Outpatient Prescriptions:  .  amoxicillin (AMOXIL) 500 MG capsule, Take 1 capsule (500 mg total) by mouth 2 (two) times daily. (Patient not taking: Reported on 10/29/2016), Disp: 14 capsule, Rfl: 0 .  aspirin 81 MG tablet, Take 81 mg by mouth daily., Disp: , Rfl:  .  azelastine (ASTELIN) 0.1 % nasal spray, Place 2 sprays into both nostrils 2 (two) times daily as needed for rhinitis. Use in each nostril as directed, Disp: 30 mL, Rfl: 5 .  Blood Glucose Monitoring Suppl KIT, by Does not apply route., Disp: , Rfl:  .  cetirizine (ZYRTEC) 10 MG tablet, Take 10 mg by mouth daily., Disp: , Rfl:  .  Cholecalciferol (VITAMIN D3) 1000 UNITS CAPS, Take 2 capsules by mouth daily. , Disp: , Rfl:  .  fluocinonide cream (LIDEX) 3.30 %, Apply 1 application topically daily as needed. Reported on 12/09/2015, Disp: , Rfl:  .  fluticasone (FLONASE) 50 MCG/ACT nasal spray, Place into both nostrils daily. Place 2 spriay into  both nostrils daily, Disp: , Rfl:  .  folic acid (FOLVITE) 1 MG tablet, Take 1 mg by mouth daily. Reported on 12/09/2015, Disp: , Rfl:  .  glucose blood (ACCU-CHEK AVIVA PLUS) test strip, Use to check blood sugar once daily., Disp: 100 each, Rfl: 12 .  ipratropium (ATROVENT) 0.06 % nasal spray, , Disp: , Rfl:  .  Lancets (ACCU-CHEK MULTICLIX) lancets, Use once daily to check blood sugars   Dx:E11.9, Disp: 100 each, Rfl: 3 .  levothyroxine (SYNTHROID, LEVOTHROID) 75 MCG tablet, TAKE 1 TABLET(75 MCG) BY MOUTH DAILY BEFORE BREAKFAST, Disp: 90 tablet, Rfl: 0 .  metroNIDAZOLE (METROGEL) 1 % gel, Apply 1 application topically daily., Disp: , Rfl:  .  nystatin (MYCOSTATIN/NYSTOP) powder, Apply topically 2 (two) times daily. To irritated area, Disp: 15 g, Rfl: 2 .  Omega-3 Fatty Acids (FISH OIL) 1000 MG CAPS, Take 2 capsules by mouth daily., Disp: , Rfl:  .  PREBIOTIC PRODUCT PO, Take 1 scoop by mouth daily., Disp: , Rfl:   Allergies:  Allergies  Allergen Reactions  . Ilevro [Nepafenac]   . Lamisil [Terbinafine Hcl] Rash and Other (See Comments)    Dysphagia and skin fungus  . Adhesive [Tape] Dermatitis  . Codeine   . Mold Extract [Trichophyton Mentagrophyte]   . Neomycin   . Nucynta [Tapentadol] Nausea And Vomiting  . Neomycin-Polymyxin-Hc Rash  . Tramadol  dizziness    Past Medical History, Surgical history, Social history, and Family History were reviewed and updated.  Review of Systems: As above  Physical Exam:  weight is 177 lb (80.3 kg). Jacqueline Mata oral temperature is 98.3 F (36.8 C). Jacqueline Mata blood pressure is 116/81 and Jacqueline Mata pulse is 71. Jacqueline Mata respiration is 16 and oxygen saturation is 97%.   Well-developed and well-nourished white female. Head and exam has no ocular or oral lesions. She has no palpable cervical or supraclavicular lymph nodes. Lungs are clear bilateral. Cardiac exam regular rate and rhythm with no murmurs rubs or bruits. Abdomen is soft. Has good bowel sounds. There is no  fluid wave. Is a palpable liver or spleen tip. Breast exam shows left breast with well-healed lumpectomy at the 9:00 position. No masses noted in the left breast. There is no left axillary adenopathy. Right breast is somewhat contracted from radiation and surgery. She has a well-healed lumpectomy at the 4:00 position. There is no mass in the right breast. There is no right axillary adenopathy. Extremities shows no clubbing cyanosis or edema. Neurological exam shows no focal neurological deficits. Skin exam no rashes ecchymosis or petechia. Lab Results  Component Value Date   WBC 8.0 04/29/2017   HGB 15.4 04/29/2017   HCT 44.3 04/29/2017   MCV 92 04/29/2017   PLT 147 04/29/2017     Chemistry      Component Value Date/Time   NA 135 04/29/2017 1157   NA 138 10/29/2016 1021   K 3.7 04/29/2017 1157   K 4.2 10/29/2016 1021   CL 102 04/29/2017 1157   CO2 27 04/29/2017 1157   CO2 24 10/29/2016 1021   BUN 9 04/29/2017 1157   BUN 17.3 10/29/2016 1021   CREATININE 0.9 04/29/2017 1157   CREATININE 0.9 10/29/2016 1021      Component Value Date/Time   CALCIUM 9.2 04/29/2017 1157   CALCIUM 10.0 10/29/2016 1021   ALKPHOS 64 04/29/2017 1157   ALKPHOS 71 10/29/2016 1021   AST 41 (H) 04/29/2017 1157   AST 24 10/29/2016 1021   ALT 37 04/29/2017 1157   ALT 26 10/29/2016 1021   BILITOT 1.30 04/29/2017 1157   BILITOT 1.18 10/29/2016 1021         Impression and Plan: Jacqueline Mata is 76 year old white female with history of synchronous bilateral breast cancer. Fortunately both breast cancers were node negative. They were ER positive and Jacqueline Mata-2 negative. And now has been about 13 years since she had surgery. I think the risk of recurrence at this point is less than 5%.  I am praying for Jacqueline Mata little dog. I she absolutely loves Jacqueline Mata 2 Maltese dogs.  For now, we will plan to get Jacqueline Mata back in another 6 months.     Volanda Napoleon, MD 8/2/20181:32 PM

## 2017-05-03 DIAGNOSIS — M9901 Segmental and somatic dysfunction of cervical region: Secondary | ICD-10-CM | POA: Diagnosis not present

## 2017-05-03 DIAGNOSIS — M9903 Segmental and somatic dysfunction of lumbar region: Secondary | ICD-10-CM | POA: Diagnosis not present

## 2017-05-03 DIAGNOSIS — M9905 Segmental and somatic dysfunction of pelvic region: Secondary | ICD-10-CM | POA: Diagnosis not present

## 2017-05-03 DIAGNOSIS — M9902 Segmental and somatic dysfunction of thoracic region: Secondary | ICD-10-CM | POA: Diagnosis not present

## 2017-05-03 DIAGNOSIS — M5033 Other cervical disc degeneration, cervicothoracic region: Secondary | ICD-10-CM | POA: Diagnosis not present

## 2017-05-03 DIAGNOSIS — R51 Headache: Secondary | ICD-10-CM | POA: Diagnosis not present

## 2017-05-03 DIAGNOSIS — M5416 Radiculopathy, lumbar region: Secondary | ICD-10-CM | POA: Diagnosis not present

## 2017-05-03 DIAGNOSIS — M5136 Other intervertebral disc degeneration, lumbar region: Secondary | ICD-10-CM | POA: Diagnosis not present

## 2017-05-04 ENCOUNTER — Encounter: Payer: Self-pay | Admitting: Internal Medicine

## 2017-05-06 ENCOUNTER — Telehealth: Payer: Self-pay | Admitting: Internal Medicine

## 2017-05-06 NOTE — Telephone Encounter (Signed)
Patient stated that she found her dates for last colonoscopy , it was in, Sept 2009 . She stated that she may not need another colonoscopy until 2019. Pt question if this test should be done every 10 years  Pt contact [phone]

## 2017-05-06 NOTE — Telephone Encounter (Signed)
Pt called wanting to know when she had her last colonoscopy? Please advise? Dr Vira Agar has done her colonoscopy. Thank you!  Call pt @ [phone].

## 2017-05-06 NOTE — Telephone Encounter (Signed)
Having colonoscopy results faxed over.

## 2017-05-18 DIAGNOSIS — M5416 Radiculopathy, lumbar region: Secondary | ICD-10-CM | POA: Diagnosis not present

## 2017-05-18 DIAGNOSIS — M5033 Other cervical disc degeneration, cervicothoracic region: Secondary | ICD-10-CM | POA: Diagnosis not present

## 2017-05-18 DIAGNOSIS — M9901 Segmental and somatic dysfunction of cervical region: Secondary | ICD-10-CM | POA: Diagnosis not present

## 2017-05-18 DIAGNOSIS — M9902 Segmental and somatic dysfunction of thoracic region: Secondary | ICD-10-CM | POA: Diagnosis not present

## 2017-05-18 DIAGNOSIS — M5136 Other intervertebral disc degeneration, lumbar region: Secondary | ICD-10-CM | POA: Diagnosis not present

## 2017-05-18 DIAGNOSIS — R51 Headache: Secondary | ICD-10-CM | POA: Diagnosis not present

## 2017-05-18 DIAGNOSIS — M9903 Segmental and somatic dysfunction of lumbar region: Secondary | ICD-10-CM | POA: Diagnosis not present

## 2017-05-18 DIAGNOSIS — M9905 Segmental and somatic dysfunction of pelvic region: Secondary | ICD-10-CM | POA: Diagnosis not present

## 2017-06-02 ENCOUNTER — Telehealth: Payer: Self-pay | Admitting: Internal Medicine

## 2017-06-02 NOTE — Telephone Encounter (Signed)
Left pt message asking to call Ebony Hail back directly at [phone] to schedule AWV. Thanks!  *NOTE* Last AWV 12/09/15

## 2017-06-08 DIAGNOSIS — M9903 Segmental and somatic dysfunction of lumbar region: Secondary | ICD-10-CM | POA: Diagnosis not present

## 2017-06-08 DIAGNOSIS — M5136 Other intervertebral disc degeneration, lumbar region: Secondary | ICD-10-CM | POA: Diagnosis not present

## 2017-06-08 DIAGNOSIS — M9905 Segmental and somatic dysfunction of pelvic region: Secondary | ICD-10-CM | POA: Diagnosis not present

## 2017-06-08 DIAGNOSIS — R51 Headache: Secondary | ICD-10-CM | POA: Diagnosis not present

## 2017-06-08 DIAGNOSIS — M5033 Other cervical disc degeneration, cervicothoracic region: Secondary | ICD-10-CM | POA: Diagnosis not present

## 2017-06-08 DIAGNOSIS — M5416 Radiculopathy, lumbar region: Secondary | ICD-10-CM | POA: Diagnosis not present

## 2017-06-08 DIAGNOSIS — M9901 Segmental and somatic dysfunction of cervical region: Secondary | ICD-10-CM | POA: Diagnosis not present

## 2017-06-08 DIAGNOSIS — M9902 Segmental and somatic dysfunction of thoracic region: Secondary | ICD-10-CM | POA: Diagnosis not present

## 2017-06-16 ENCOUNTER — Other Ambulatory Visit: Payer: Self-pay | Admitting: Internal Medicine

## 2017-06-23 NOTE — Telephone Encounter (Signed)
Error

## 2017-06-28 ENCOUNTER — Telehealth: Payer: Self-pay | Admitting: Internal Medicine

## 2017-06-28 DIAGNOSIS — E039 Hypothyroidism, unspecified: Secondary | ICD-10-CM

## 2017-06-28 DIAGNOSIS — E782 Mixed hyperlipidemia: Secondary | ICD-10-CM

## 2017-06-28 DIAGNOSIS — E559 Vitamin D deficiency, unspecified: Secondary | ICD-10-CM

## 2017-06-28 DIAGNOSIS — E119 Type 2 diabetes mellitus without complications: Secondary | ICD-10-CM

## 2017-06-28 NOTE — Telephone Encounter (Signed)
Patient called asking if she needs labs before her 6 month follow up appointment that is scheduled on Thursday 10.4.18 with Dr. Derrel Nip.

## 2017-06-29 DIAGNOSIS — M9902 Segmental and somatic dysfunction of thoracic region: Secondary | ICD-10-CM | POA: Diagnosis not present

## 2017-06-29 DIAGNOSIS — R51 Headache: Secondary | ICD-10-CM | POA: Diagnosis not present

## 2017-06-29 DIAGNOSIS — M5033 Other cervical disc degeneration, cervicothoracic region: Secondary | ICD-10-CM | POA: Diagnosis not present

## 2017-06-29 DIAGNOSIS — M9901 Segmental and somatic dysfunction of cervical region: Secondary | ICD-10-CM | POA: Diagnosis not present

## 2017-06-29 NOTE — Telephone Encounter (Signed)
Ordered pt's labs and scheduled pt for a lab appt for the morning of her appt but before she sees Dr. Derrel Nip. Pt is aware of appt date and time.

## 2017-07-01 ENCOUNTER — Other Ambulatory Visit: Payer: PPO

## 2017-07-01 ENCOUNTER — Encounter: Payer: Self-pay | Admitting: Internal Medicine

## 2017-07-01 ENCOUNTER — Ambulatory Visit (INDEPENDENT_AMBULATORY_CARE_PROVIDER_SITE_OTHER): Payer: PPO | Admitting: Internal Medicine

## 2017-07-01 DIAGNOSIS — E663 Overweight: Secondary | ICD-10-CM | POA: Diagnosis not present

## 2017-07-01 DIAGNOSIS — E782 Mixed hyperlipidemia: Secondary | ICD-10-CM

## 2017-07-01 DIAGNOSIS — E559 Vitamin D deficiency, unspecified: Secondary | ICD-10-CM | POA: Diagnosis not present

## 2017-07-01 DIAGNOSIS — E119 Type 2 diabetes mellitus without complications: Secondary | ICD-10-CM | POA: Diagnosis not present

## 2017-07-01 DIAGNOSIS — E039 Hypothyroidism, unspecified: Secondary | ICD-10-CM

## 2017-07-01 DIAGNOSIS — Z23 Encounter for immunization: Secondary | ICD-10-CM

## 2017-07-01 LAB — COMPREHENSIVE METABOLIC PANEL
ALT: 24 U/L (ref 0–35)
AST: 23 U/L (ref 0–37)
Albumin: 4.3 g/dL (ref 3.5–5.2)
Alkaline Phosphatase: 57 U/L (ref 39–117)
BUN: 8 mg/dL (ref 6–23)
CALCIUM: 9.6 mg/dL (ref 8.4–10.5)
CO2: 25 meq/L (ref 19–32)
CREATININE: 0.77 mg/dL (ref 0.40–1.20)
Chloride: 106 mEq/L (ref 96–112)
GFR: 77.57 mL/min (ref >60.00)
GLUCOSE: 125 mg/dL — AB (ref 70–99)
Potassium: 3.8 mEq/L (ref 3.5–5.1)
Sodium: 139 mEq/L (ref 135–145)
Total Bilirubin: 1.1 mg/dL (ref 0.2–1.2)
Total Protein: 7.1 g/dL (ref 6.0–8.3)

## 2017-07-01 LAB — HEMOGLOBIN A1C: Hgb A1c MFr Bld: 6.2 % (ref 4.6–6.5)

## 2017-07-01 LAB — LIPID PANEL
Cholesterol: 217 mg/dL — ABNORMAL HIGH (ref 0–200)
HDL: 75.4 mg/dL (ref >39.00)
LDL Cholesterol: 124 mg/dL — ABNORMAL HIGH (ref 0–99)
NonHDL: 141.4
Total CHOL/HDL Ratio: 3
Triglycerides: 86 mg/dL (ref 0.0–149.0)
VLDL: 17.2 mg/dL (ref 0.0–40.0)

## 2017-07-01 LAB — VITAMIN D 25 HYDROXY (VIT D DEFICIENCY, FRACTURES): VITD: 41.94 ng/mL (ref 30.00–100.00)

## 2017-07-01 LAB — TSH: TSH: 1.35 u[IU]/mL (ref 0.35–4.50)

## 2017-07-01 LAB — LDL CHOLESTEROL, DIRECT: LDL DIRECT: 122 mg/dL

## 2017-07-01 MED ORDER — GLUCOSE BLOOD VI STRP: ORAL_STRIP | 5 refills | Status: DC

## 2017-07-01 MED ORDER — HYDROXYZINE HCL 25 MG PO TABS: 25.0000 mg | ORAL_TABLET | Freq: Three times a day (TID) | ORAL | 0 refills | Status: DC | PRN

## 2017-07-01 NOTE — Progress Notes (Signed)
Subjective:  Patient ID: Jacqueline Mata, female    DOB: 10-23-1940  Age: 76 y.o. MRN: [id number]  CC: Diagnoses of Mixed hyperlipidemia, Diabetes mellitus type 2, diet-controlled ([place]), Vitamin D deficiency, Acquired hypothyroidism, Encounter for immunization, and Overweight (BMI 25.0-29.9) were pertinent to this visit.  HPI Jacqueline Mata presents for 23monthfollow up on diabetes.  Patient has no complaints today.  Patient is following a low glycemic index diet and taking all prescribed medications regularly without side effects.  Fasting sugars have been under less than 120 most of the time and post prandials have been under 160 except on rare occasions. Patient is exercising about 3 times per week and intentionally trying to lose weight .  Patient has had an eye exam in the last 12 months and checks feet regularly for signs of infection.  Patient does not walk barefoot outside,  And denies an numbness tingling or burning in feet. Patient is up to date on all recommended vaccinations  DUE FOR FOOT EXAM normal   And =recieved FLU SHOT   HAS lost 16 lbs following [name]  END OF DIABETES "   DIET . (2013)     BASED ON  THE PROGRAM "EAT  TO LIVE"  For diabetes.  NO  DAIRY,   MEAT NO EGGS AND ONLY   6 OUNCES OF ANIMAL PROTEIN  PER WEEK     Had to put her dog Jacqueline Mata to sleep due to congestive heart failure  n Sept 1  . [name] sister  [name] adjusting and accepting her new role     Lab Results  Component Value Date   HGBA1C 6.2 07/01/2017     Outpatient Medications Prior to Visit  Medication Sig Dispense Refill  . aspirin 81 MG tablet Take 81 mg by mouth daily.    .Marland Kitchenazelastine (ASTELIN) 0.1 % nasal spray Place 2 sprays into both nostrils 2 (two) times daily as needed for rhinitis. Use in each nostril as directed 30 mL 5  . Blood Glucose Monitoring Suppl KIT by Does not apply route.    . cetirizine (ZYRTEC) 10 MG tablet Take 10 mg by mouth daily.    . Cholecalciferol (VITAMIN D3)  1000 UNITS CAPS Take 2 capsules by mouth daily.     . fluocinonide cream (LIDEX) 03.41% Apply 1 application topically daily as needed. Reported on 12/09/2015    . fluticasone (FLONASE) 50 MCG/ACT nasal spray Place into both nostrils daily. Place 2 spriay into both nostrils daily    . folic acid (FOLVITE) 1 MG tablet Take 1 mg by mouth daily. Reported on 12/09/2015    . Lancets (ACCU-CHEK MULTICLIX) lancets Use once daily to check blood sugars   Dx:E11.9 100 each 3  . levothyroxine (SYNTHROID, LEVOTHROID) 75 MCG tablet TAKE 1 TABLET(75 MCG) BY MOUTH DAILY BEFORE BREAKFAST 90 tablet 0  . metroNIDAZOLE (METROGEL) 1 % gel Apply 1 application topically daily.    . Omega-3 Fatty Acids (FISH OIL) 1000 MG CAPS Take 2 capsules by mouth daily.    .Marland KitchenPREBIOTIC PRODUCT PO Take 1 scoop by mouth daily.    .Marland Kitchenglucose blood (ACCU-CHEK AVIVA PLUS) test strip Use to check blood sugar once daily. 100 each 12  . amoxicillin (AMOXIL) 500 MG capsule Take 1 capsule (500 mg total) by mouth 2 (two) times daily. (Patient not taking: Reported on 10/29/2016) 14 capsule 0  . ipratropium (ATROVENT) 0.06 % nasal spray     . nystatin (MYCOSTATIN/NYSTOP)  powder Apply topically 2 (two) times daily. To irritated area (Patient not taking: Reported on 07/01/2017) 15 g 2   No facility-administered medications prior to visit.     Review of Systems;  Patient denies headache, fevers, malaise, unintentional weight loss, skin rash, eye pain, sinus congestion and sinus pain, sore throat, dysphagia,  hemoptysis , cough, dyspnea, wheezing, chest pain, palpitations, orthopnea, edema, abdominal pain, nausea, melena, diarrhea, constipation, flank pain, dysuria, hematuria, urinary  Frequency, nocturia, numbness, tingling, seizures,  Focal weakness, Loss of consciousness,  Tremor, insomnia, depression, anxiety, and suicidal ideation.      Objective:  BP 106/62 (BP Location: Left Arm, Patient Position: Sitting, Cuff Size: Normal)   Pulse 67   Temp  97.6 F (36.4 C) (Oral)   Resp 15   Ht _0  (1.651 m)   Wt 161 lb 12.8 oz (73.4 kg)   SpO2 98%   BMI 26.92 kg/m   BP Readings from Last 3 Encounters:  07/01/17 106/62  04/29/17 116/81  12/31/16 124/82    Wt Readings from Last 3 Encounters:  07/01/17 161 lb 12.8 oz (73.4 kg)  04/29/17 177 lb (80.3 kg)  12/31/16 173 lb 9.6 oz (78.7 kg)    General appearance: alert, cooperative and appears stated age Ears: normal TM's and external ear canals both ears Throat: lips, mucosa, and tongue normal; teeth and gums normal Neck: no adenopathy, no carotid bruit, supple, symmetrical, trachea midline and thyroid not enlarged, symmetric, no tenderness/mass/nodules Back: symmetric, no curvature. ROM normal. No CVA tenderness. Lungs: clear to auscultation bilaterally Heart: regular rate and rhythm, S1, S2 normal, no murmur, click, rub or gallop Abdomen: soft, non-tender; bowel sounds normal; no masses,  no organomegaly Pulses: 2+ and symmetric Skin: Skin color, texture, turgor normal. No rashes or lesions Lymph nodes: Cervical, supraclavicular, and axillary nodes normal.  Lab Results  Component Value Date   HGBA1C 6.2 07/01/2017   HGBA1C 6.7 (H) 12/31/2016   HGBA1C 6.5 09/02/2016    Lab Results  Component Value Date   CREATININE 0.77 07/01/2017   CREATININE 0.9 04/29/2017   CREATININE 0.87 12/31/2016    Lab Results  Component Value Date   WBC 8.0 04/29/2017   HGB 15.4 04/29/2017   HCT 44.3 04/29/2017   PLT 147 04/29/2017   GLUCOSE 125 (H) 07/01/2017   CHOL 217 (H) 07/01/2017   TRIG 86.0 07/01/2017   HDL 75.40 07/01/2017   LDLDIRECT 122.0 07/01/2017   LDLCALC 124 (H) 07/01/2017   ALT 24 07/01/2017   AST 23 07/01/2017   NA 139 07/01/2017   K 3.8 07/01/2017   CL 106 07/01/2017   CREATININE 0.77 07/01/2017   BUN 8 07/01/2017   CO2 25 07/01/2017   TSH 1.35 07/01/2017   INR 1.0 03/14/2012   HGBA1C 6.2 07/01/2017   MICROALBUR <0.7 12/31/2016    Mr Cervical Spine Wo  Contrast  Result Date: 08/05/2015 CLINICAL DATA:  Cervical spondylosis with myelopathy. Right arm pain EXAM: MRI CERVICAL SPINE WITHOUT CONTRAST TECHNIQUE: Multiplanar, multisequence MR imaging of the cervical spine was performed. No intravenous contrast was administered. COMPARISON:  None. FINDINGS: Normal cervical alignment. Mild anterior slip T1-2 and T2-3 consistent with disc and facet degeneration. Negative for fracture or mass. No bone marrow edema. Generous sized spinal canal. No cord compression. Spinal cord signal normal without cord lesion. Cervical medullary junction normal. C2-3:  Negative C3-4: Mild disc and mild facet degeneration without spinal or foraminal stenosis. C4-5: Mild disc degeneration and mild uncinate spurring. Bilateral facet hypertrophy.  Mild foraminal narrowing bilaterally. No spinal stenosis C5-6: Mild disc degeneration with uncinate spurring. Mild facet hypertrophy bilaterally. Mild foraminal narrowing bilaterally. C6-7: Moderate disc degeneration and spondylosis. Mild foraminal narrowing on the left due to uncinate spurring. C7-T1: Negative IMPRESSION: Mild cervical disc and facet degeneration at multiple levels. Mild foraminal narrowing bilaterally due to bony overgrowth. No focal disc protrusion. The patient has a generous sized spinal canal and there is no spinal stenosis or cord lesion. Electronically Signed   By: Franchot Gallo M.D.   On: 08/05/2015 10:12    Assessment & Plan:   Problem List Items Addressed This Visit    Acquired hypothyroidism    Thyroid function is WNL on current dose.  No current changes needed.   Lab Results  Component Value Date   TSH 1.35 07/01/2017         Diabetes mellitus type 2, diet-controlled ([place])     Improved with new diet,  A1c improved from 6.7 to 6.2  Patient is up-to-date on eye exams and foot exam is normal today. Patient has no evidence of microalbuminuria . Patient is intolerant of statin therapy for CAD risk  reduction.  Lab Results  Component Value Date   HGBA1C 6.2 07/01/2017   Lab Results  Component Value Date   MICROALBUR <0.7 12/31/2016           Hyperlipidemia    Untreated due to statin intolerance with history of multiple trials causiing myalgias.  Her LDL has decreased by 20 pts with current diet  Lab Results  Component Value Date   CHOL 217 (H) 07/01/2017   HDL 75.40 07/01/2017   LDLCALC 124 (H) 07/01/2017   LDLDIRECT 122.0 07/01/2017   TRIG 86.0 07/01/2017   CHOLHDL 3 07/01/2017            Overweight (BMI 25.0-29.9)    I have congratulated her in reduction of   BMI and encouraged  Continued adherence to [employer] diet  and regular exercise a minimum of 5 days per week.         Other Visit Diagnoses    Vitamin D deficiency       Encounter for immunization       Relevant Orders   Flu vaccine HIGH DOSE PF (Completed)      I have discontinued Ms. Mapps's ipratropium and nystatin. I am also having her start on hydrOXYzine. Additionally, I am having her maintain her aspirin, Blood Glucose Monitoring Suppl, fluocinonide cream, fluticasone, folic acid, Vitamin D3, azelastine, accu-chek multiclix, cetirizine, amoxicillin, metroNIDAZOLE, Fish Oil, PREBIOTIC PRODUCT PO, levothyroxine, and glucose blood.  Meds ordered this encounter  Medications  . glucose blood (ACCU-CHEK AVIVA PLUS) test strip    Sig: Use to check blood sugar once daily.    Dispense:  100 each    Refill:  5    Diagnosis Code  E11.9  . hydrOXYzine (ATARAX/VISTARIL) 25 MG tablet    Sig: Take 1 tablet (25 mg total) by mouth 3 (three) times daily as needed.    Dispense:  90 tablet    Refill:  0    Medications Discontinued During This Encounter  Medication Reason  . ipratropium (ATROVENT) 0.06 % nasal spray Patient has not taken in last 30 days  . nystatin (MYCOSTATIN/NYSTOP) powder Patient has not taken in last 30 days  . glucose blood (ACCU-CHEK AVIVA PLUS) test strip Reorder     Follow-up: Return in about 6 months (around 12/30/2017) for follow up diabetes.   Deborra Medina  L, MD

## 2017-07-01 NOTE — Patient Instructions (Addendum)
SUPER [employer]  IN [place] MAY SAVE YOU SOME $$$$$$    If you want to stop folic acid  Stop it in January  And rtc for labs in April   FOR THE ITCHING,  YOU CAN TRY THE  ORAL MEDICATION I PRESCRIBED CALLED "hydroxyzine"  Every 8 hours

## 2017-07-03 NOTE — Assessment & Plan Note (Signed)
Improved with new diet,  A1c improved from 6.7 to 6.2  Patient is up-to-date on eye exams and foot exam is normal today. Patient has no evidence of microalbuminuria . Patient is intolerant of statin therapy for CAD risk reduction.  Lab Results  Component Value Date   HGBA1C 6.2 07/01/2017   Lab Results  Component Value Date   MICROALBUR <0.7 12/31/2016

## 2017-07-03 NOTE — Assessment & Plan Note (Signed)
Thyroid function is WNL on current dose.  No current changes needed.   Lab Results  Component Value Date   TSH 1.35 07/01/2017

## 2017-07-03 NOTE — Assessment & Plan Note (Addendum)
Untreated due to statin intolerance with history of multiple trials causiing myalgias.  Her LDL has decreased by 20 pts with current diet  Lab Results  Component Value Date   CHOL 217 (H) 07/01/2017   HDL 75.40 07/01/2017   LDLCALC 124 (H) 07/01/2017   LDLDIRECT 122.0 07/01/2017   TRIG 86.0 07/01/2017   CHOLHDL 3 07/01/2017

## 2017-07-03 NOTE — Assessment & Plan Note (Signed)
I have congratulated her in reduction of   BMI and encouraged  Continued adherence to [employer] diet  and regular exercise a minimum of 5 days per week.

## 2017-07-04 ENCOUNTER — Encounter: Payer: Self-pay | Admitting: Internal Medicine

## 2017-07-07 NOTE — Telephone Encounter (Signed)
Left pt message asking to call Ebony Hail back directly at [phone] to schedule AWV. Thanks!  *NOTE* Last AWV 12/09/15

## 2017-07-20 DIAGNOSIS — M9902 Segmental and somatic dysfunction of thoracic region: Secondary | ICD-10-CM | POA: Diagnosis not present

## 2017-07-20 DIAGNOSIS — M9901 Segmental and somatic dysfunction of cervical region: Secondary | ICD-10-CM | POA: Diagnosis not present

## 2017-07-20 DIAGNOSIS — M5033 Other cervical disc degeneration, cervicothoracic region: Secondary | ICD-10-CM | POA: Diagnosis not present

## 2017-07-20 DIAGNOSIS — R51 Headache: Secondary | ICD-10-CM | POA: Diagnosis not present

## 2017-08-10 DIAGNOSIS — M9902 Segmental and somatic dysfunction of thoracic region: Secondary | ICD-10-CM | POA: Diagnosis not present

## 2017-08-10 DIAGNOSIS — M5033 Other cervical disc degeneration, cervicothoracic region: Secondary | ICD-10-CM | POA: Diagnosis not present

## 2017-08-10 DIAGNOSIS — M9901 Segmental and somatic dysfunction of cervical region: Secondary | ICD-10-CM | POA: Diagnosis not present

## 2017-08-10 DIAGNOSIS — R51 Headache: Secondary | ICD-10-CM | POA: Diagnosis not present

## 2017-08-14 ENCOUNTER — Encounter: Payer: Self-pay | Admitting: Internal Medicine

## 2017-08-31 DIAGNOSIS — M9901 Segmental and somatic dysfunction of cervical region: Secondary | ICD-10-CM | POA: Diagnosis not present

## 2017-08-31 DIAGNOSIS — M9902 Segmental and somatic dysfunction of thoracic region: Secondary | ICD-10-CM | POA: Diagnosis not present

## 2017-08-31 DIAGNOSIS — M5033 Other cervical disc degeneration, cervicothoracic region: Secondary | ICD-10-CM | POA: Diagnosis not present

## 2017-08-31 DIAGNOSIS — R51 Headache: Secondary | ICD-10-CM | POA: Diagnosis not present

## 2017-09-16 ENCOUNTER — Other Ambulatory Visit: Payer: Self-pay | Admitting: Internal Medicine

## 2017-09-17 NOTE — Telephone Encounter (Signed)
This is Dr Derrel Nip Teresa's patient, please Advise on refill.

## 2017-09-20 DIAGNOSIS — M9902 Segmental and somatic dysfunction of thoracic region: Secondary | ICD-10-CM | POA: Diagnosis not present

## 2017-09-20 DIAGNOSIS — M9901 Segmental and somatic dysfunction of cervical region: Secondary | ICD-10-CM | POA: Diagnosis not present

## 2017-09-20 DIAGNOSIS — M5033 Other cervical disc degeneration, cervicothoracic region: Secondary | ICD-10-CM | POA: Diagnosis not present

## 2017-09-20 DIAGNOSIS — R51 Headache: Secondary | ICD-10-CM | POA: Diagnosis not present

## 2017-10-12 DIAGNOSIS — M9901 Segmental and somatic dysfunction of cervical region: Secondary | ICD-10-CM | POA: Diagnosis not present

## 2017-10-12 DIAGNOSIS — M9902 Segmental and somatic dysfunction of thoracic region: Secondary | ICD-10-CM | POA: Diagnosis not present

## 2017-10-12 DIAGNOSIS — M5033 Other cervical disc degeneration, cervicothoracic region: Secondary | ICD-10-CM | POA: Diagnosis not present

## 2017-10-12 DIAGNOSIS — R51 Headache: Secondary | ICD-10-CM | POA: Diagnosis not present

## 2017-10-14 DIAGNOSIS — L821 Other seborrheic keratosis: Secondary | ICD-10-CM | POA: Diagnosis not present

## 2017-10-14 DIAGNOSIS — D2272 Melanocytic nevi of left lower limb, including hip: Secondary | ICD-10-CM | POA: Diagnosis not present

## 2017-10-14 DIAGNOSIS — Z85828 Personal history of other malignant neoplasm of skin: Secondary | ICD-10-CM | POA: Diagnosis not present

## 2017-10-14 DIAGNOSIS — X32XXXA Exposure to sunlight, initial encounter: Secondary | ICD-10-CM | POA: Diagnosis not present

## 2017-10-14 DIAGNOSIS — D225 Melanocytic nevi of trunk: Secondary | ICD-10-CM | POA: Diagnosis not present

## 2017-10-14 DIAGNOSIS — L570 Actinic keratosis: Secondary | ICD-10-CM | POA: Diagnosis not present

## 2017-10-26 DIAGNOSIS — R51 Headache: Secondary | ICD-10-CM | POA: Diagnosis not present

## 2017-10-26 DIAGNOSIS — M9902 Segmental and somatic dysfunction of thoracic region: Secondary | ICD-10-CM | POA: Diagnosis not present

## 2017-10-26 DIAGNOSIS — M9901 Segmental and somatic dysfunction of cervical region: Secondary | ICD-10-CM | POA: Diagnosis not present

## 2017-10-26 DIAGNOSIS — M5033 Other cervical disc degeneration, cervicothoracic region: Secondary | ICD-10-CM | POA: Diagnosis not present

## 2017-11-01 ENCOUNTER — Encounter: Payer: Self-pay | Admitting: Hematology & Oncology

## 2017-11-01 ENCOUNTER — Inpatient Hospital Stay (HOSPITAL_BASED_OUTPATIENT_CLINIC_OR_DEPARTMENT_OTHER): Payer: PPO | Admitting: Hematology & Oncology

## 2017-11-01 ENCOUNTER — Other Ambulatory Visit: Payer: Self-pay

## 2017-11-01 ENCOUNTER — Inpatient Hospital Stay: Payer: PPO | Attending: Hematology & Oncology

## 2017-11-01 VITALS — BP 107/62 | HR 80 | Temp 98.1°F | Resp 18 | Wt 168.0 lb

## 2017-11-01 DIAGNOSIS — C50919 Malignant neoplasm of unspecified site of unspecified female breast: Secondary | ICD-10-CM

## 2017-11-01 DIAGNOSIS — Z853 Personal history of malignant neoplasm of breast: Secondary | ICD-10-CM | POA: Diagnosis not present

## 2017-11-01 DIAGNOSIS — Z170 Estrogen receptor positive status [ER+]: Secondary | ICD-10-CM | POA: Diagnosis not present

## 2017-11-01 LAB — CMP (CANCER CENTER ONLY)
ALT: 21 U/L (ref 0–55)
ANION GAP: 11 (ref 5–15)
AST: 29 U/L (ref 5–34)
Albumin: 3.8 g/dL (ref 3.5–5.0)
Alkaline Phosphatase: 68 U/L (ref 26–84)
BILIRUBIN TOTAL: 1.1 mg/dL (ref 0.2–1.2)
BUN: 12 mg/dL (ref 7–22)
CALCIUM: 9.4 mg/dL (ref 8.0–10.3)
CO2: 27 mmol/L (ref 18–33)
Chloride: 106 mmol/L (ref 98–108)
Creatinine: 0.8 mg/dL (ref 0.60–1.10)
GLUCOSE: 122 mg/dL — AB (ref 73–118)
POTASSIUM: 3.8 mmol/L (ref 3.3–4.7)
Sodium: 144 mmol/L (ref 128–145)
Total Protein: 7.2 g/dL (ref 6.4–8.1)

## 2017-11-01 LAB — CBC WITH DIFFERENTIAL (CANCER CENTER ONLY)
BASOS ABS: 0.1 10*3/uL (ref 0.0–0.1)
BASOS PCT: 1 %
Eosinophils Absolute: 0.3 10*3/uL (ref 0.0–0.5)
Eosinophils Relative: 4 %
HEMATOCRIT: 42.4 % (ref 34.8–46.6)
HEMOGLOBIN: 14.6 g/dL (ref 11.6–15.9)
LYMPHS PCT: 32 %
Lymphs Abs: 2.9 10*3/uL (ref 0.9–3.3)
MCH: 31.3 pg (ref 26.0–34.0)
MCHC: 34.4 g/dL (ref 32.0–36.0)
MCV: 90.8 fL (ref 81.0–101.0)
MONO ABS: 0.5 10*3/uL (ref 0.1–0.9)
MONOS PCT: 6 %
NEUTROS ABS: 5.1 10*3/uL (ref 1.5–6.5)
NEUTROS PCT: 57 %
Platelet Count: 160 10*3/uL (ref 145–400)
RBC: 4.67 MIL/uL (ref 3.70–5.32)
RDW: 13 % (ref 11.1–15.7)
WBC Count: 8.9 10*3/uL (ref 3.9–10.0)

## 2017-11-01 NOTE — Progress Notes (Signed)
Hematology and Oncology Follow Up Visit  Jacqueline Mata [id number] December 14, 1940 77 y.o. 11/01/2017   Principle Diagnosis:  Synchronous bilateral stage IIA (T2 N0 M0) ductal carcinoma of bilateral breast - ER+/HER2-.  Current Therapy:    Observation     Interim History:  Ms.  Mata is comes in for followup.  She is doing quite well.  We last saw her about 6 months ago.  Everything is going well.  Unfortunately, she did lose 1 of her dogs.  He had congestive heart failure.  She has been doing well.  She is exercising.  She is going to a gym and walking.  She is very happy about doing this.  She has had no problems with nausea or vomiting.  She has had no issues with cough.  There is no shortness of breath.  She has had no change in bowel or bladder habits.  She has had no leg swelling.  She has had no rashes.  Last mammogram was back in May 2018.  Everything looked fine.  Overall, her performance status is ECOG 0.   Medications:  Current Outpatient Medications:  .  amoxicillin (AMOXIL) 500 MG capsule, Take 1 capsule (500 mg total) by mouth 2 (two) times daily. (Patient not taking: Reported on 10/29/2016), Disp: 14 capsule, Rfl: 0 .  aspirin 81 MG tablet, Take 81 mg by mouth daily., Disp: , Rfl:  .  azelastine (ASTELIN) 0.1 % nasal spray, Place 2 sprays into both nostrils 2 (two) times daily as needed for rhinitis. Use in each nostril as directed, Disp: 30 mL, Rfl: 5 .  Blood Glucose Monitoring Suppl KIT, by Does not apply route., Disp: , Rfl:  .  cetirizine (ZYRTEC) 10 MG tablet, Take 10 mg by mouth daily., Disp: , Rfl:  .  Cholecalciferol (VITAMIN D3) 1000 UNITS CAPS, Take 2 capsules by mouth daily. , Disp: , Rfl:  .  fluocinonide cream (LIDEX) 9.73 %, Apply 1 application topically daily as needed. Reported on 12/09/2015, Disp: , Rfl:  .  fluticasone (FLONASE) 50 MCG/ACT nasal spray, Place into both nostrils daily. Place 2 spriay into both nostrils daily, Disp: , Rfl:  .  folic acid  (FOLVITE) 1 MG tablet, Take 1 mg by mouth daily. Reported on 12/09/2015, Disp: , Rfl:  .  glucose blood (ACCU-CHEK AVIVA PLUS) test strip, Use to check blood sugar once daily., Disp: 100 each, Rfl: 5 .  hydrOXYzine (ATARAX/VISTARIL) 25 MG tablet, Take 1 tablet (25 mg total) by mouth 3 (three) times daily as needed., Disp: 90 tablet, Rfl: 0 .  Lancets (ACCU-CHEK MULTICLIX) lancets, Use once daily to check blood sugars   Dx:E11.9, Disp: 100 each, Rfl: 3 .  levothyroxine (SYNTHROID, LEVOTHROID) 75 MCG tablet, TAKE 1 TABLET(75 MCG) BY MOUTH DAILY BEFORE BREAKFAST, Disp: 90 tablet, Rfl: 0 .  metroNIDAZOLE (METROGEL) 1 % gel, Apply 1 application topically daily., Disp: , Rfl:  .  Omega-3 Fatty Acids (FISH OIL) 1000 MG CAPS, Take 2 capsules by mouth daily., Disp: , Rfl:  .  PREBIOTIC PRODUCT PO, Take 1 scoop by mouth daily., Disp: , Rfl:   Allergies:  Allergies  Allergen Reactions  . Codeine Other (See Comments) and Shortness Of Breath    Altered mental staus  . Ilevro [Nepafenac]   . Lamisil [Terbinafine] Rash and Other (See Comments)    Dysphagia and skin fungus  . Other Swelling    ILLEVRO (EYE DROPS)- EYE SWELLING  DOGS.Congestion. FEATHERS.Congestion  . Mold Extract [Trichophyton Mentagrophyte]   .  Molds & Smuts Other (See Comments)    Congestion.  Marland Kitchen Neomycin Rash  . Neomycin-Bacitracin Zn-Polymyx Rash  . Neomycin-Polymyxin-Hc Rash  . Tape Dermatitis    Blisters  . Tapentadol Nausea And Vomiting and Other (See Comments)  . Terbinafine Rash    Trouble swallowing  . Tramadol Other (See Comments), Nausea Only and Nausea And Vomiting    dizziness    Past Medical History, Surgical history, Social history, and Family History were reviewed and updated.  Review of Systems: Review of Systems  Constitutional: Negative.   HENT: Negative.   Eyes: Negative.   Respiratory: Negative.   Cardiovascular: Negative.   Gastrointestinal: Negative.   Genitourinary: Negative.    Musculoskeletal: Negative.   Skin: Negative.   Neurological: Negative.   Endo/Heme/Allergies: Negative.   Psychiatric/Behavioral: Negative.      Physical Exam:  weight is 168 lb (76.2 kg). Her oral temperature is 98.1 F (36.7 C). Her blood pressure is 107/62 and her pulse is 80. Her respiration is 18 and oxygen saturation is 97%.   Physical Exam  Constitutional: She is oriented to person, place, and time.  HENT:  Head: Normocephalic and atraumatic.  Mouth/Throat: Oropharynx is clear and moist.  Eyes: EOM are normal. Pupils are equal, round, and reactive to light.  Neck: Normal range of motion.  Cardiovascular: Normal rate, regular rhythm and normal heart sounds.  Pulmonary/Chest: Effort normal and breath sounds normal.  Abdominal: Soft. Bowel sounds are normal.  Musculoskeletal: Normal range of motion. She exhibits no edema, tenderness or deformity.  Lymphadenopathy:    She has no cervical adenopathy.  Neurological: She is alert and oriented to person, place, and time.  Skin: Skin is warm and dry. No rash noted. No erythema.  Psychiatric: She has a normal mood and affect. Her behavior is normal. Judgment and thought content normal.  Vitals reviewed.  Lab Results  Component Value Date   WBC 8.9 11/01/2017   HGB 15.4 04/29/2017   HCT 42.4 11/01/2017   MCV 90.8 11/01/2017   PLT 160 11/01/2017     Chemistry      Component Value Date/Time   NA 144 11/01/2017 1421   NA 135 04/29/2017 1157   NA 138 10/29/2016 1021   K 3.8 11/01/2017 1421   K 3.7 04/29/2017 1157   K 4.2 10/29/2016 1021   CL 106 11/01/2017 1421   CL 102 04/29/2017 1157   CO2 27 11/01/2017 1421   CO2 27 04/29/2017 1157   CO2 24 10/29/2016 1021   BUN 12 11/01/2017 1421   BUN 9 04/29/2017 1157   BUN 17.3 10/29/2016 1021   CREATININE 0.77 07/01/2017 0905   CREATININE 0.9 04/29/2017 1157   CREATININE 0.9 10/29/2016 1021      Component Value Date/Time   CALCIUM 9.4 11/01/2017 1421   CALCIUM 9.2  04/29/2017 1157   CALCIUM 10.0 10/29/2016 1021   ALKPHOS 68 11/01/2017 1421   ALKPHOS 64 04/29/2017 1157   ALKPHOS 71 10/29/2016 1021   AST 29 11/01/2017 1421   AST 24 10/29/2016 1021   ALT 21 11/01/2017 1421   ALT 37 04/29/2017 1157   ALT 26 10/29/2016 1021   BILITOT 1.1 11/01/2017 1421   BILITOT 1.18 10/29/2016 1021       Impression and Plan: Ms. Stencil is 77 year old white female with history of synchronous bilateral breast cancer. Fortunately both breast cancers were node negative. They were ER positive and HER-2 negative.   Is now been about 14 years.  So far,  everything is looking fantastic.  I see no evidence of recurrence.  We will plan to get her back in another 6 months.     Volanda Napoleon, MD 2/4/20193:25 PM

## 2017-11-09 DIAGNOSIS — M9902 Segmental and somatic dysfunction of thoracic region: Secondary | ICD-10-CM | POA: Diagnosis not present

## 2017-11-09 DIAGNOSIS — R51 Headache: Secondary | ICD-10-CM | POA: Diagnosis not present

## 2017-11-09 DIAGNOSIS — M5033 Other cervical disc degeneration, cervicothoracic region: Secondary | ICD-10-CM | POA: Diagnosis not present

## 2017-11-09 DIAGNOSIS — M9901 Segmental and somatic dysfunction of cervical region: Secondary | ICD-10-CM | POA: Diagnosis not present

## 2017-11-23 DIAGNOSIS — R51 Headache: Secondary | ICD-10-CM | POA: Diagnosis not present

## 2017-11-23 DIAGNOSIS — M9902 Segmental and somatic dysfunction of thoracic region: Secondary | ICD-10-CM | POA: Diagnosis not present

## 2017-11-23 DIAGNOSIS — M5033 Other cervical disc degeneration, cervicothoracic region: Secondary | ICD-10-CM | POA: Diagnosis not present

## 2017-11-23 DIAGNOSIS — M9901 Segmental and somatic dysfunction of cervical region: Secondary | ICD-10-CM | POA: Diagnosis not present

## 2017-12-06 DIAGNOSIS — E119 Type 2 diabetes mellitus without complications: Secondary | ICD-10-CM | POA: Diagnosis not present

## 2017-12-06 LAB — HM DIABETES EYE EXAM

## 2017-12-13 ENCOUNTER — Encounter: Payer: Self-pay | Admitting: Internal Medicine

## 2017-12-13 DIAGNOSIS — M5432 Sciatica, left side: Secondary | ICD-10-CM

## 2017-12-13 DIAGNOSIS — M4722 Other spondylosis with radiculopathy, cervical region: Secondary | ICD-10-CM

## 2017-12-14 DIAGNOSIS — M9901 Segmental and somatic dysfunction of cervical region: Secondary | ICD-10-CM | POA: Diagnosis not present

## 2017-12-14 DIAGNOSIS — R51 Headache: Secondary | ICD-10-CM | POA: Diagnosis not present

## 2017-12-14 DIAGNOSIS — M9902 Segmental and somatic dysfunction of thoracic region: Secondary | ICD-10-CM | POA: Diagnosis not present

## 2017-12-14 DIAGNOSIS — M5033 Other cervical disc degeneration, cervicothoracic region: Secondary | ICD-10-CM | POA: Diagnosis not present

## 2017-12-15 ENCOUNTER — Encounter: Payer: Self-pay | Admitting: Internal Medicine

## 2017-12-19 ENCOUNTER — Other Ambulatory Visit: Payer: Self-pay | Admitting: Internal Medicine

## 2017-12-30 ENCOUNTER — Encounter: Payer: Self-pay | Admitting: Internal Medicine

## 2017-12-30 ENCOUNTER — Ambulatory Visit (INDEPENDENT_AMBULATORY_CARE_PROVIDER_SITE_OTHER): Payer: PPO | Admitting: Internal Medicine

## 2017-12-30 VITALS — BP 114/66 | HR 86 | Temp 97.5°F | Resp 15 | Ht 65.0 in | Wt 165.4 lb

## 2017-12-30 DIAGNOSIS — E119 Type 2 diabetes mellitus without complications: Secondary | ICD-10-CM

## 2017-12-30 DIAGNOSIS — E7849 Other hyperlipidemia: Secondary | ICD-10-CM

## 2017-12-30 DIAGNOSIS — E039 Hypothyroidism, unspecified: Secondary | ICD-10-CM | POA: Diagnosis not present

## 2017-12-30 DIAGNOSIS — H832X3 Labyrinthine dysfunction, bilateral: Secondary | ICD-10-CM | POA: Diagnosis not present

## 2017-12-30 DIAGNOSIS — C50919 Malignant neoplasm of unspecified site of unspecified female breast: Secondary | ICD-10-CM | POA: Diagnosis not present

## 2017-12-30 DIAGNOSIS — Z170 Estrogen receptor positive status [ER+]: Secondary | ICD-10-CM

## 2017-12-30 DIAGNOSIS — Z1211 Encounter for screening for malignant neoplasm of colon: Secondary | ICD-10-CM

## 2017-12-30 DIAGNOSIS — T466X5A Adverse effect of antihyperlipidemic and antiarteriosclerotic drugs, initial encounter: Secondary | ICD-10-CM | POA: Diagnosis not present

## 2017-12-30 DIAGNOSIS — E538 Deficiency of other specified B group vitamins: Secondary | ICD-10-CM

## 2017-12-30 DIAGNOSIS — Z6741 Type O blood, Rh negative: Secondary | ICD-10-CM

## 2017-12-30 DIAGNOSIS — M791 Myalgia, unspecified site: Secondary | ICD-10-CM | POA: Diagnosis not present

## 2017-12-30 LAB — COMPREHENSIVE METABOLIC PANEL
ALBUMIN: 4.1 g/dL (ref 3.5–5.2)
ALK PHOS: 61 U/L (ref 39–117)
ALT: 24 U/L (ref 0–35)
AST: 27 U/L (ref 0–37)
BUN: 13 mg/dL (ref 6–23)
CO2: 25 mEq/L (ref 19–32)
Calcium: 9.5 mg/dL (ref 8.4–10.5)
Chloride: 103 mEq/L (ref 96–112)
Creatinine, Ser: 0.83 mg/dL (ref 0.40–1.20)
GFR: 71.04 mL/min (ref >60.00)
Glucose, Bld: 126 mg/dL — ABNORMAL HIGH (ref 70–99)
Potassium: 4 mEq/L (ref 3.5–5.1)
Sodium: 138 mEq/L (ref 135–145)
TOTAL PROTEIN: 7.1 g/dL (ref 6.0–8.3)
Total Bilirubin: 1.1 mg/dL (ref 0.2–1.2)

## 2017-12-30 LAB — LIPID PANEL
Cholesterol: 211 mg/dL — ABNORMAL HIGH (ref 0–200)
HDL: 80.3 mg/dL (ref >39.00)
LDL CALC: 114 mg/dL — AB (ref 0–99)
NONHDL: 130.59
Total CHOL/HDL Ratio: 3
Triglycerides: 83 mg/dL (ref 0.0–149.0)
VLDL: 16.6 mg/dL (ref 0.0–40.0)

## 2017-12-30 LAB — MICROALBUMIN / CREATININE URINE RATIO
Creatinine,U: 117.4 mg/dL
Microalb Creat Ratio: 0.6 mg/g (ref 0.0–30.0)
Microalb, Ur: <0.7 mg/dL (ref 0.0–1.9)

## 2017-12-30 LAB — HEMOGLOBIN A1C: Hgb A1c MFr Bld: 6.3 % (ref 4.6–6.5)

## 2017-12-30 MED ORDER — MECLIZINE HCL 25 MG PO TABS: 25.0000 mg | ORAL_TABLET | Freq: Three times a day (TID) | ORAL | 5 refills | Status: DC | PRN

## 2017-12-30 MED ORDER — MOMETASONE FUROATE 50 MCG/ACT NA SUSP: 2.0000 | Freq: Every day | NASAL | 12 refills | Status: DC

## 2017-12-30 NOTE — Progress Notes (Signed)
Subjective:  Patient ID: Jacqueline Mata, female    DOB: 02/09/1941  Age: 77 y.o. MRN: [id number]  CC: The primary encounter diagnosis was Other hyperlipidemia. Diagnoses of Folic acid deficiency, Diabetes mellitus type 2, diet-controlled ([place]), Blood type O-, Colon cancer screening, Myalgia due to statin, Acquired hypothyroidism, Stage 2 carcinoma of breast, ER+, unspecified laterality ([place]), and Balance problem due to vestibular dysfunction of both ears were also pertinent to this visit.  HPI [name] presents for follow up on multiple issues  Including hyperlipidemia,  Type 2 DM and breast cancer,  BRCA:  Stage IIA DCIS bilateral  managed by Burney Gauze last OV Feb 2019. Mammogram due May 2019  Colonoscopy was  normal in 2009.  Discussed 5-10 yr follow up  Recurrent vertigo : self limiting ,  Aggravated by pollen season.   6 month follow up on diabetes.  Patient has no complaints today.  Patient is following a low glycemic index diet and taking all prescribed medications regularly without side effects.  Fasting sugars have been under less than 140 most of the time and post prandials have been under 160 except on rare occasions. Patient is exercising about 3 times per week and intentionally trying to lose weight .  Patient has had an eye exam in the last 12 months and checks feet regularly for signs of infection.  Patient does not walk barefoot outside,  And denies an numbness tingling or burning in feet. Patient is up to date on all recommended vaccinations  Foot exam done   Has gained 4 lbs after coming off the [employer] that eliminated  oil and eggs.   Outpatient Medications Prior to Visit  Medication Sig Dispense Refill  . aspirin 81 MG tablet Take 81 mg by mouth daily.    Marland Kitchen azelastine (ASTELIN) 0.1 % nasal spray Place 2 sprays into both nostrils 2 (two) times daily as needed for rhinitis. Use in each nostril as directed 30 mL 5  . Blood Glucose  Monitoring Suppl KIT by Does not apply route.    . cetirizine (ZYRTEC) 10 MG tablet Take 10 mg by mouth as needed.     . Cholecalciferol (VITAMIN D3) 1000 UNITS CAPS Take 2 capsules by mouth daily.     . fluocinonide cream (LIDEX) 2.20 % Apply 1 application topically daily as needed. Reported on 12/09/2015    . fluticasone (FLONASE) 50 MCG/ACT nasal spray Place into both nostrils daily. Place 2 spriay into both nostrils daily    . glucose blood (ACCU-CHEK AVIVA PLUS) test strip Use to check blood sugar once daily. 100 each 5  . Lancets (ACCU-CHEK MULTICLIX) lancets Use once daily to check blood sugars   Dx:E11.9 100 each 3  . levothyroxine (SYNTHROID, LEVOTHROID) 75 MCG tablet TAKE 1 TABLET(75 MCG) BY MOUTH DAILY BEFORE BREAKFAST 90 tablet 0  . metroNIDAZOLE (METROGEL) 1 % gel Apply 1 application topically daily.    . Omega-3 Fatty Acids (FISH OIL) 1000 MG CAPS Take 2 capsules by mouth daily.    Marland Kitchen PREBIOTIC PRODUCT PO Take 1 scoop by mouth daily.    Marland Kitchen amoxicillin (AMOXIL) 500 MG capsule Take 1 capsule (500 mg total) by mouth 2 (two) times daily. (Patient not taking: Reported on 12/30/2017) 14 capsule 0  . folic acid (FOLVITE) 1 MG tablet Take 1 mg by mouth daily. Reported on 12/09/2015    . hydrOXYzine (ATARAX/VISTARIL) 25 MG tablet Take 1 tablet (25 mg total) by mouth 3 (three) times  daily as needed. (Patient not taking: Reported on 12/30/2017) 90 tablet 0   No facility-administered medications prior to visit.     Review of Systems;  Patient denies headache, fevers, malaise, unintentional weight loss, skin rash, eye pain, sinus congestion and sinus pain, sore throat, dysphagia,  hemoptysis , cough, dyspnea, wheezing, chest pain, palpitations, orthopnea, edema, abdominal pain, nausea, melena, diarrhea, constipation, flank pain, dysuria, hematuria, urinary  Frequency, nocturia, numbness, tingling, seizures,  Focal weakness, Loss of consciousness,  Tremor, insomnia, depression, anxiety, and suicidal  ideation.      Objective:  BP 114/66 (BP Location: Left Arm, Patient Position: Sitting, Cuff Size: Normal)   Pulse 86   Temp (!) 97.5 F (36.4 C) (Oral)   Resp 15   Ht '5\' 5"'$  (1.651 m)   Wt 165 lb 6.4 oz (75 kg)   SpO2 98%   BMI 27.52 kg/m   BP Readings from Last 3 Encounters:  12/30/17 114/66  11/01/17 107/62  07/01/17 106/62    Wt Readings from Last 3 Encounters:  12/30/17 165 lb 6.4 oz (75 kg)  11/01/17 168 lb (76.2 kg)  07/01/17 161 lb 12.8 oz (73.4 kg)    General appearance: alert, cooperative and appears stated age Ears: normal TM's and external ear canals both ears Throat: lips, mucosa, and tongue normal; teeth and gums normal Neck: no adenopathy, no carotid bruit, supple, symmetrical, trachea midline and thyroid not enlarged, symmetric, no tenderness/mass/nodules Back: symmetric, no curvature. ROM normal. No CVA tenderness. Lungs: clear to auscultation bilaterally Heart: regular rate and rhythm, S1, S2 normal, no murmur, click, rub or gallop Abdomen: soft, non-tender; bowel sounds normal; no masses,  no organomegaly Pulses: 2+ and symmetric Skin: Skin color, texture, turgor normal. No rashes or lesions Lymph nodes: Cervical, supraclavicular, and axillary nodes normal.  Lab Results  Component Value Date   HGBA1C 6.3 12/30/2017   HGBA1C 6.2 07/01/2017   HGBA1C 6.7 (H) 12/31/2016    Lab Results  Component Value Date   CREATININE 0.83 12/30/2017   CREATININE 0.80 11/01/2017   CREATININE 0.77 07/01/2017    Lab Results  Component Value Date   WBC 8.9 11/01/2017   HGB 15.4 04/29/2017   HCT 42.4 11/01/2017   PLT 160 11/01/2017   GLUCOSE 126 (H) 12/30/2017   CHOL 211 (H) 12/30/2017   TRIG 83.0 12/30/2017   HDL 80.30 12/30/2017   LDLDIRECT 122.0 07/01/2017   LDLCALC 114 (H) 12/30/2017   ALT 24 12/30/2017   AST 27 12/30/2017   NA 138 12/30/2017   K 4.0 12/30/2017   CL 103 12/30/2017   CREATININE 0.83 12/30/2017   BUN 13 12/30/2017   CO2 25  12/30/2017   TSH 1.35 07/01/2017   INR 1.0 03/14/2012   HGBA1C 6.3 12/30/2017   MICROALBUR <0.7 12/30/2017    Mr Cervical Spine Wo Contrast  Result Date: 08/05/2015 CLINICAL DATA:  Cervical spondylosis with myelopathy. Right arm pain EXAM: MRI CERVICAL SPINE WITHOUT CONTRAST TECHNIQUE: Multiplanar, multisequence MR imaging of the cervical spine was performed. No intravenous contrast was administered. COMPARISON:  None. FINDINGS: Normal cervical alignment. Mild anterior slip T1-2 and T2-3 consistent with disc and facet degeneration. Negative for fracture or mass. No bone marrow edema. Generous sized spinal canal. No cord compression. Spinal cord signal normal without cord lesion. Cervical medullary junction normal. C2-3:  Negative C3-4: Mild disc and mild facet degeneration without spinal or foraminal stenosis. C4-5: Mild disc degeneration and mild uncinate spurring. Bilateral facet hypertrophy. Mild foraminal narrowing bilaterally. No spinal  stenosis C5-6: Mild disc degeneration with uncinate spurring. Mild facet hypertrophy bilaterally. Mild foraminal narrowing bilaterally. C6-7: Moderate disc degeneration and spondylosis. Mild foraminal narrowing on the left due to uncinate spurring. C7-T1: Negative IMPRESSION: Mild cervical disc and facet degeneration at multiple levels. Mild foraminal narrowing bilaterally due to bony overgrowth. No focal disc protrusion. The patient has a generous sized spinal canal and there is no spinal stenosis or cord lesion. Electronically Signed   By: Franchot Gallo M.D.   On: 08/05/2015 10:12    Assessment & Plan:   Problem List Items Addressed This Visit    Stage 2 carcinoma of breast, ER+, unspecified laterality ([place])    With no recurrence by serial screening managed by Dr Marin Olp,  Next mammogram due in May      Relevant Medications   meclizine (ANTIVERT) 25 MG tablet   Myalgia due to statin    She has a history of myalgias resolved with suspension of statin          Hyperlipidemia - Primary    Untreated due to statin intolerance with history of multiple trials causiing myalgias.  Her LDL has decreased by 20 pts with current diet  Lab Results  Component Value Date   CHOL 211 (H) 12/30/2017   HDL 80.30 12/30/2017   LDLCALC 114 (H) 12/30/2017   LDLDIRECT 122.0 07/01/2017   TRIG 83.0 12/30/2017   CHOLHDL 3 12/30/2017            Relevant Orders   Lipid panel (Completed)   Diabetes mellitus type 2, diet-controlled ([place])    Excellent control with diet alone.   Patient is up-to-date on eye exams and foot exam is normal today. Patient has no evidence of microalbuminuria . Patient is intolerant of statin therapy for CAD risk reduction. She is taking a baby asa for primary prevention.   Lab Results  Component Value Date   HGBA1C 6.3 12/30/2017   Lab Results  Component Value Date   MICROALBUR <0.7 12/30/2017           Relevant Orders   Hemoglobin A1c (Completed)   Microalbumin / creatinine urine ratio (Completed)   Comprehensive metabolic panel (Completed)   Balance problem due to vestibular dysfunction    Her recurrent vertigo appears to be aggravated by pollen.  Meclizine prn  And daily use of steroid nasal spray  Recommended       Acquired hypothyroidism    Thyroid function is WNL on current dose.  No current changes needed.   Lab Results  Component Value Date   TSH 1.35 07/01/2017          Other Visit Diagnoses    Folic acid deficiency       Relevant Orders   RBC Folate (Completed)   Hemoglobin A1c (Completed)   Blood type O-       Relevant Orders   ABO/Rh   Colon cancer screening       Relevant Orders   Cologuard     A total of 25 minutes of face to face time was spent with patient more than half of which was spent in counselling about the above mentioned conditions  and coordination of care  I have discontinued Grayland Ormond. Youman's hydrOXYzine. I am also having her start on meclizine and mometasone.  Additionally, I am having her maintain her aspirin, Blood Glucose Monitoring Suppl, fluocinonide cream, fluticasone, folic acid, Vitamin D3, azelastine, accu-chek multiclix, cetirizine, amoxicillin, metroNIDAZOLE, Fish Oil, PREBIOTIC PRODUCT PO, glucose blood, and  levothyroxine.  Meds ordered this encounter  Medications  . meclizine (ANTIVERT) 25 MG tablet    Sig: Take 1 tablet (25 mg total) by mouth 3 (three) times daily as needed for dizziness.    Dispense:  30 tablet    Refill:  5  . mometasone (NASONEX) 50 MCG/ACT nasal spray    Sig: Place 2 sprays into the nose daily.    Dispense:  17 g    Refill:  12    Medications Discontinued During This Encounter  Medication Reason  . hydrOXYzine (ATARAX/VISTARIL) 25 MG tablet Patient has not taken in last 30 days    Follow-up: Return in about 6 months (around 07/01/2018) for follow up diabetes.   [name], MD

## 2017-12-30 NOTE — Patient Instructions (Addendum)
Cain Saupe is an Museum/gallery conservator with a lot of PT experience   Adding mometasone nasal spray for allergy season  Flush sinuses when coming in from outside  Meclizine as needed for vertigo  Your last colonoscopy was normal in 2009 ,and you are considered at "average risk"  So Cologuard would be appropriate for continued screening every 3 years starting this year    [facility]!  We have also had our dogs treated at:  [employer] [facility] on [facility] in [place]

## 2017-12-30 NOTE — Progress Notes (Signed)
Ordered and faxed to [facility]

## 2017-12-31 LAB — FOLATE RBC: RBC Folate: 693 ng/mL RBC (ref >280)

## 2018-01-02 DIAGNOSIS — M791 Myalgia, unspecified site: Secondary | ICD-10-CM | POA: Insufficient documentation

## 2018-01-02 DIAGNOSIS — T466X5A Adverse effect of antihyperlipidemic and antiarteriosclerotic drugs, initial encounter: Secondary | ICD-10-CM | POA: Insufficient documentation

## 2018-01-02 NOTE — Assessment & Plan Note (Signed)
Thyroid function is WNL on current dose.  No current changes needed.   Lab Results  Component Value Date   TSH 1.35 07/01/2017

## 2018-01-02 NOTE — Assessment & Plan Note (Signed)
Untreated due to statin intolerance with history of multiple trials causiing myalgias.  Her LDL has decreased by 20 pts with current diet  Lab Results  Component Value Date   CHOL 211 (H) 12/30/2017   HDL 80.30 12/30/2017   LDLCALC 114 (H) 12/30/2017   LDLDIRECT 122.0 07/01/2017   TRIG 83.0 12/30/2017   CHOLHDL 3 12/30/2017

## 2018-01-02 NOTE — Assessment & Plan Note (Signed)
She has a history of myalgias resolved with suspension of statin   °

## 2018-01-02 NOTE — Assessment & Plan Note (Signed)
With no recurrence by serial screening managed by Dr Marin Olp,  Next mammogram due in May

## 2018-01-02 NOTE — Assessment & Plan Note (Signed)
Excellent control with diet alone.   Patient is up-to-date on eye exams and foot exam is normal today. Patient has no evidence of microalbuminuria . Patient is intolerant of statin therapy for CAD risk reduction. She is taking a baby asa for primary prevention.   Lab Results  Component Value Date   HGBA1C 6.3 12/30/2017   Lab Results  Component Value Date   MICROALBUR <0.7 12/30/2017

## 2018-01-02 NOTE — Assessment & Plan Note (Signed)
Her recurrent vertigo appears to be aggravated by pollen.  Meclizine prn  And daily use of steroid nasal spray  Recommended

## 2018-01-03 DIAGNOSIS — M5013 Cervical disc disorder with radiculopathy, cervicothoracic region: Secondary | ICD-10-CM | POA: Diagnosis not present

## 2018-01-03 DIAGNOSIS — M542 Cervicalgia: Secondary | ICD-10-CM | POA: Diagnosis not present

## 2018-01-05 DIAGNOSIS — M9902 Segmental and somatic dysfunction of thoracic region: Secondary | ICD-10-CM | POA: Diagnosis not present

## 2018-01-05 DIAGNOSIS — M5033 Other cervical disc degeneration, cervicothoracic region: Secondary | ICD-10-CM | POA: Diagnosis not present

## 2018-01-05 DIAGNOSIS — R51 Headache: Secondary | ICD-10-CM | POA: Diagnosis not present

## 2018-01-05 DIAGNOSIS — M9901 Segmental and somatic dysfunction of cervical region: Secondary | ICD-10-CM | POA: Diagnosis not present

## 2018-01-06 ENCOUNTER — Encounter: Payer: Self-pay | Admitting: Internal Medicine

## 2018-01-08 ENCOUNTER — Encounter: Payer: Self-pay | Admitting: Internal Medicine

## 2018-01-10 DIAGNOSIS — Z1211 Encounter for screening for malignant neoplasm of colon: Secondary | ICD-10-CM | POA: Diagnosis not present

## 2018-01-10 DIAGNOSIS — Z1212 Encounter for screening for malignant neoplasm of rectum: Secondary | ICD-10-CM | POA: Diagnosis not present

## 2018-01-20 LAB — COLOGUARD

## 2018-01-22 ENCOUNTER — Encounter: Payer: Self-pay | Admitting: Internal Medicine

## 2018-01-22 DIAGNOSIS — M4722 Other spondylosis with radiculopathy, cervical region: Secondary | ICD-10-CM

## 2018-01-22 DIAGNOSIS — M5432 Sciatica, left side: Secondary | ICD-10-CM

## 2018-01-26 DIAGNOSIS — M5033 Other cervical disc degeneration, cervicothoracic region: Secondary | ICD-10-CM | POA: Diagnosis not present

## 2018-01-26 DIAGNOSIS — R51 Headache: Secondary | ICD-10-CM | POA: Diagnosis not present

## 2018-01-26 DIAGNOSIS — M9902 Segmental and somatic dysfunction of thoracic region: Secondary | ICD-10-CM | POA: Diagnosis not present

## 2018-01-26 DIAGNOSIS — M9901 Segmental and somatic dysfunction of cervical region: Secondary | ICD-10-CM | POA: Diagnosis not present

## 2018-02-01 ENCOUNTER — Encounter: Payer: Self-pay | Admitting: Internal Medicine

## 2018-02-15 DIAGNOSIS — M5033 Other cervical disc degeneration, cervicothoracic region: Secondary | ICD-10-CM | POA: Diagnosis not present

## 2018-02-15 DIAGNOSIS — R51 Headache: Secondary | ICD-10-CM | POA: Diagnosis not present

## 2018-02-15 DIAGNOSIS — M9901 Segmental and somatic dysfunction of cervical region: Secondary | ICD-10-CM | POA: Diagnosis not present

## 2018-02-15 DIAGNOSIS — M9902 Segmental and somatic dysfunction of thoracic region: Secondary | ICD-10-CM | POA: Diagnosis not present

## 2018-02-25 DIAGNOSIS — M8589 Other specified disorders of bone density and structure, multiple sites: Secondary | ICD-10-CM | POA: Diagnosis not present

## 2018-02-25 DIAGNOSIS — Z1231 Encounter for screening mammogram for malignant neoplasm of breast: Secondary | ICD-10-CM | POA: Diagnosis not present

## 2018-02-25 DIAGNOSIS — Z853 Personal history of malignant neoplasm of breast: Secondary | ICD-10-CM | POA: Diagnosis not present

## 2018-02-25 LAB — HM MAMMOGRAPHY

## 2018-02-25 LAB — HM DEXA SCAN

## 2018-02-28 ENCOUNTER — Encounter: Payer: Self-pay | Admitting: Internal Medicine

## 2018-02-28 DIAGNOSIS — M8580 Other specified disorders of bone density and structure, unspecified site: Secondary | ICD-10-CM

## 2018-03-02 DIAGNOSIS — M8580 Other specified disorders of bone density and structure, unspecified site: Secondary | ICD-10-CM | POA: Insufficient documentation

## 2018-03-09 DIAGNOSIS — R51 Headache: Secondary | ICD-10-CM | POA: Diagnosis not present

## 2018-03-09 DIAGNOSIS — M9901 Segmental and somatic dysfunction of cervical region: Secondary | ICD-10-CM | POA: Diagnosis not present

## 2018-03-09 DIAGNOSIS — M5033 Other cervical disc degeneration, cervicothoracic region: Secondary | ICD-10-CM | POA: Diagnosis not present

## 2018-03-09 DIAGNOSIS — M9902 Segmental and somatic dysfunction of thoracic region: Secondary | ICD-10-CM | POA: Diagnosis not present

## 2018-03-19 ENCOUNTER — Other Ambulatory Visit: Payer: Self-pay | Admitting: Internal Medicine

## 2018-03-29 DIAGNOSIS — M9901 Segmental and somatic dysfunction of cervical region: Secondary | ICD-10-CM | POA: Diagnosis not present

## 2018-03-29 DIAGNOSIS — M5033 Other cervical disc degeneration, cervicothoracic region: Secondary | ICD-10-CM | POA: Diagnosis not present

## 2018-03-29 DIAGNOSIS — R51 Headache: Secondary | ICD-10-CM | POA: Diagnosis not present

## 2018-03-29 DIAGNOSIS — M9902 Segmental and somatic dysfunction of thoracic region: Secondary | ICD-10-CM | POA: Diagnosis not present

## 2018-04-07 ENCOUNTER — Encounter: Payer: Self-pay | Admitting: Internal Medicine

## 2018-04-08 ENCOUNTER — Encounter: Payer: Self-pay | Admitting: Internal Medicine

## 2018-04-14 DIAGNOSIS — D2272 Melanocytic nevi of left lower limb, including hip: Secondary | ICD-10-CM | POA: Diagnosis not present

## 2018-04-14 DIAGNOSIS — D0439 Carcinoma in situ of skin of other parts of face: Secondary | ICD-10-CM | POA: Diagnosis not present

## 2018-04-14 DIAGNOSIS — D2271 Melanocytic nevi of right lower limb, including hip: Secondary | ICD-10-CM | POA: Diagnosis not present

## 2018-04-14 DIAGNOSIS — L570 Actinic keratosis: Secondary | ICD-10-CM | POA: Diagnosis not present

## 2018-04-14 DIAGNOSIS — D225 Melanocytic nevi of trunk: Secondary | ICD-10-CM | POA: Diagnosis not present

## 2018-04-14 DIAGNOSIS — D2262 Melanocytic nevi of left upper limb, including shoulder: Secondary | ICD-10-CM | POA: Diagnosis not present

## 2018-04-14 DIAGNOSIS — X32XXXA Exposure to sunlight, initial encounter: Secondary | ICD-10-CM | POA: Diagnosis not present

## 2018-04-14 DIAGNOSIS — D485 Neoplasm of uncertain behavior of skin: Secondary | ICD-10-CM | POA: Diagnosis not present

## 2018-04-14 DIAGNOSIS — Z85828 Personal history of other malignant neoplasm of skin: Secondary | ICD-10-CM | POA: Diagnosis not present

## 2018-04-14 DIAGNOSIS — Z08 Encounter for follow-up examination after completed treatment for malignant neoplasm: Secondary | ICD-10-CM | POA: Diagnosis not present

## 2018-04-14 DIAGNOSIS — D692 Other nonthrombocytopenic purpura: Secondary | ICD-10-CM | POA: Diagnosis not present

## 2018-04-14 DIAGNOSIS — D2261 Melanocytic nevi of right upper limb, including shoulder: Secondary | ICD-10-CM | POA: Diagnosis not present

## 2018-04-19 DIAGNOSIS — R51 Headache: Secondary | ICD-10-CM | POA: Diagnosis not present

## 2018-04-19 DIAGNOSIS — M9901 Segmental and somatic dysfunction of cervical region: Secondary | ICD-10-CM | POA: Diagnosis not present

## 2018-04-19 DIAGNOSIS — M5033 Other cervical disc degeneration, cervicothoracic region: Secondary | ICD-10-CM | POA: Diagnosis not present

## 2018-04-19 DIAGNOSIS — M9902 Segmental and somatic dysfunction of thoracic region: Secondary | ICD-10-CM | POA: Diagnosis not present

## 2018-04-27 ENCOUNTER — Telehealth: Payer: Self-pay | Admitting: Internal Medicine

## 2018-04-27 NOTE — Telephone Encounter (Signed)
Pt declined AWV. She follows all of her speciality doctors regularly

## 2018-05-02 ENCOUNTER — Ambulatory Visit: Payer: PPO | Admitting: Hematology & Oncology

## 2018-05-02 ENCOUNTER — Other Ambulatory Visit: Payer: PPO

## 2018-05-04 ENCOUNTER — Inpatient Hospital Stay: Payer: PPO

## 2018-05-04 ENCOUNTER — Inpatient Hospital Stay: Payer: PPO | Admitting: Hematology & Oncology

## 2018-05-17 DIAGNOSIS — R51 Headache: Secondary | ICD-10-CM | POA: Diagnosis not present

## 2018-05-17 DIAGNOSIS — M5033 Other cervical disc degeneration, cervicothoracic region: Secondary | ICD-10-CM | POA: Diagnosis not present

## 2018-05-17 DIAGNOSIS — M9901 Segmental and somatic dysfunction of cervical region: Secondary | ICD-10-CM | POA: Diagnosis not present

## 2018-05-17 DIAGNOSIS — M9902 Segmental and somatic dysfunction of thoracic region: Secondary | ICD-10-CM | POA: Diagnosis not present

## 2018-05-25 DIAGNOSIS — D0439 Carcinoma in situ of skin of other parts of face: Secondary | ICD-10-CM | POA: Diagnosis not present

## 2018-05-26 ENCOUNTER — Other Ambulatory Visit: Payer: Self-pay

## 2018-05-26 ENCOUNTER — Inpatient Hospital Stay (HOSPITAL_BASED_OUTPATIENT_CLINIC_OR_DEPARTMENT_OTHER): Payer: PPO | Admitting: Hematology & Oncology

## 2018-05-26 ENCOUNTER — Inpatient Hospital Stay: Payer: PPO | Attending: Hematology & Oncology

## 2018-05-26 ENCOUNTER — Encounter: Payer: Self-pay | Admitting: Hematology & Oncology

## 2018-05-26 VITALS — BP 120/75 | HR 77 | Temp 98.3°F | Resp 19 | Wt 171.0 lb

## 2018-05-26 DIAGNOSIS — Z170 Estrogen receptor positive status [ER+]: Secondary | ICD-10-CM

## 2018-05-26 DIAGNOSIS — Z79899 Other long term (current) drug therapy: Secondary | ICD-10-CM | POA: Diagnosis not present

## 2018-05-26 DIAGNOSIS — Z853 Personal history of malignant neoplasm of breast: Secondary | ICD-10-CM

## 2018-05-26 DIAGNOSIS — C50919 Malignant neoplasm of unspecified site of unspecified female breast: Secondary | ICD-10-CM

## 2018-05-26 DIAGNOSIS — C44712 Basal cell carcinoma of skin of right lower limb, including hip: Secondary | ICD-10-CM

## 2018-05-26 LAB — CMP (CANCER CENTER ONLY)
ALK PHOS: 72 U/L (ref 26–84)
ALT: 36 U/L (ref 10–47)
ANION GAP: 3 — AB (ref 5–15)
AST: 31 U/L (ref 11–38)
Albumin: 3.9 g/dL (ref 3.5–5.0)
BILIRUBIN TOTAL: 0.8 mg/dL (ref 0.2–1.6)
BUN: 14 mg/dL (ref 7–22)
CALCIUM: 9.7 mg/dL (ref 8.0–10.3)
CHLORIDE: 105 mmol/L (ref 98–108)
CO2: 30 mmol/L (ref 18–33)
Creatinine: 0.6 mg/dL (ref 0.60–1.20)
Glucose, Bld: 94 mg/dL (ref 73–118)
Potassium: 3.6 mmol/L (ref 3.3–4.7)
Sodium: 138 mmol/L (ref 128–145)
Total Protein: 7.4 g/dL (ref 6.4–8.1)

## 2018-05-26 LAB — CBC WITH DIFFERENTIAL (CANCER CENTER ONLY)
Basophils Absolute: 0.1 10*3/uL (ref 0.0–0.1)
Basophils Relative: 1 %
Eosinophils Absolute: 0.3 10*3/uL (ref 0.0–0.5)
Eosinophils Relative: 4 %
HCT: 43.9 % (ref 34.8–46.6)
HEMOGLOBIN: 14.8 g/dL (ref 11.6–15.9)
LYMPHS ABS: 2.7 10*3/uL (ref 0.9–3.3)
LYMPHS PCT: 30 %
MCH: 31.4 pg (ref 26.0–34.0)
MCHC: 33.7 g/dL (ref 32.0–36.0)
MCV: 93.2 fL (ref 81.0–101.0)
MONO ABS: 0.5 10*3/uL (ref 0.1–0.9)
MONOS PCT: 6 %
NEUTROS ABS: 5.4 10*3/uL (ref 1.5–6.5)
Neutrophils Relative %: 59 %
Platelet Count: 175 10*3/uL (ref 145–400)
RBC: 4.71 MIL/uL (ref 3.70–5.32)
RDW: 13 % (ref 11.1–15.7)
WBC Count: 9.1 10*3/uL (ref 3.9–10.0)

## 2018-05-26 NOTE — Progress Notes (Signed)
Hematology and Oncology Follow Up Visit  [name] [id number] 03-31-1941 77 y.o. 05/26/2018   Principle Diagnosis:  Synchronous bilateral stage IIA (T2 N0 M0) ductal carcinoma of bilateral breast - ER+/HER2-.  Current Therapy:    Observation     Interim History:  Ms.  Jacqueline Mata is comes in for followup.  She is doing quite well.  We last saw her about 6 months ago.  Everything is going well.  Unfortunately, she did lose 1 of her dogs.  He had congestive heart failure.  She has been doing well.  She is exercising.  She is going to a gym and walking.  She is very happy about doing this.  She has had no problems with nausea or vomiting.  She has had no issues with cough.  There is no shortness of breath.  She has had no change in bowel or bladder habits.  She has had no leg swelling.  She has had no rashes.  Last mammogram was back in May 2019.  Everything looked fine.  Overall, her performance status is ECOG 0.   Medications:  Current Outpatient Medications:  .  amoxicillin (AMOXIL) 500 MG capsule, Take 1 capsule (500 mg total) by mouth 2 (two) times daily. (Patient not taking: Reported on 12/30/2017), Disp: 14 capsule, Rfl: 0 .  aspirin 81 MG tablet, Take 81 mg by mouth daily., Disp: , Rfl:  .  Blood Glucose Monitoring Suppl KIT, by Does not apply route., Disp: , Rfl:  .  cetirizine (ZYRTEC) 10 MG tablet, Take 10 mg by mouth as needed. , Disp: , Rfl:  .  Cholecalciferol (VITAMIN D3) 1000 UNITS CAPS, Take 2 capsules by mouth daily. , Disp: , Rfl:  .  fluocinonide cream (LIDEX) 2.53 %, Apply 1 application topically daily as needed. Reported on 12/09/2015, Disp: , Rfl:  .  fluticasone (FLONASE) 50 MCG/ACT nasal spray, Place into both nostrils daily. Place 2 spriay into both nostrils daily, Disp: , Rfl:  .  glucose blood (ACCU-CHEK AVIVA PLUS) test strip, Use to check blood sugar once daily., Disp: 100 each, Rfl: 5 .  Lancets (ACCU-CHEK MULTICLIX) lancets, Use once daily to check blood  sugars   Dx:E11.9, Disp: 100 each, Rfl: 3 .  levothyroxine (SYNTHROID, LEVOTHROID) 75 MCG tablet, TAKE 1 TABLET(75 MCG) BY MOUTH DAILY BEFORE BREAKFAST, Disp: 90 tablet, Rfl: 0 .  metroNIDAZOLE (METROGEL) 1 % gel, Apply 1 application topically daily., Disp: , Rfl:  .  mometasone (NASONEX) 50 MCG/ACT nasal spray, Place 2 sprays into the nose daily., Disp: 17 g, Rfl: 12 .  Omega-3 Fatty Acids (FISH OIL) 1000 MG CAPS, Take 2 capsules by mouth daily., Disp: , Rfl:  .  PREBIOTIC PRODUCT PO, Take 1 scoop by mouth daily., Disp: , Rfl:   Allergies:  Allergies  Allergen Reactions  . Codeine Other (See Comments) and Shortness Of Breath    Altered mental staus  . Ilevro [Nepafenac]   . Lamisil [Terbinafine] Rash and Other (See Comments)    Dysphagia and skin fungus  . Other Swelling    ILLEVRO (EYE DROPS)- EYE SWELLING  DOGS.Congestion. FEATHERS.Congestion  . Mold Extract [Trichophyton Mentagrophyte]   . Molds & Smuts Other (See Comments)    Congestion.  Marland Kitchen Neomycin Rash  . Neomycin-Bacitracin Zn-Polymyx Rash  . Neomycin-Polymyxin-Hc Rash  . Tape Dermatitis    Blisters  . Tapentadol Nausea And Vomiting and Other (See Comments)  . Terbinafine Rash    Trouble swallowing  . Tramadol Other (See Comments),  Nausea Only and Nausea And Vomiting    dizziness    Past Medical History, Surgical history, Social history, and Family History were reviewed and updated.  Review of Systems: Review of Systems  Constitutional: Negative.   HENT: Negative.   Eyes: Negative.   Respiratory: Negative.   Cardiovascular: Negative.   Gastrointestinal: Negative.   Genitourinary: Negative.   Musculoskeletal: Negative.   Skin: Negative.   Neurological: Negative.   Endo/Heme/Allergies: Negative.   Psychiatric/Behavioral: Negative.      Physical Exam:  weight is 171 lb (77.6 kg). Her oral temperature is 98.3 F (36.8 C). Her blood pressure is 120/75 and her pulse is 77. Her respiration is 19 and oxygen  saturation is 98%.   Physical Exam  Constitutional: She is oriented to person, place, and time.  HENT:  Head: Normocephalic and atraumatic.  Mouth/Throat: Oropharynx is clear and moist.  Eyes: Pupils are equal, round, and reactive to light. EOM are normal.  Neck: Normal range of motion.  Cardiovascular: Normal rate, regular rhythm and normal heart sounds.  Pulmonary/Chest: Effort normal and breath sounds normal.  Abdominal: Soft. Bowel sounds are normal.  Musculoskeletal: Normal range of motion. She exhibits no edema, tenderness or deformity.  Lymphadenopathy:    She has no cervical adenopathy.  Neurological: She is alert and oriented to person, place, and time.  Skin: Skin is warm and dry. No rash noted. No erythema.  Psychiatric: She has a normal mood and affect. Her behavior is normal. Judgment and thought content normal.  Vitals reviewed.  Lab Results  Component Value Date   WBC 9.1 05/26/2018   HGB 14.8 05/26/2018   HCT 43.9 05/26/2018   MCV 93.2 05/26/2018   PLT 175 05/26/2018     Chemistry      Component Value Date/Time   NA 138 05/26/2018 1413   NA 135 04/29/2017 1157   NA 138 10/29/2016 1021   K 3.6 05/26/2018 1413   K 3.7 04/29/2017 1157   K 4.2 10/29/2016 1021   CL 105 05/26/2018 1413   CL 102 04/29/2017 1157   CO2 30 05/26/2018 1413   CO2 27 04/29/2017 1157   CO2 24 10/29/2016 1021   BUN 14 05/26/2018 1413   BUN 9 04/29/2017 1157   BUN 17.3 10/29/2016 1021   CREATININE 0.60 05/26/2018 1413   CREATININE 0.9 04/29/2017 1157   CREATININE 0.9 10/29/2016 1021      Component Value Date/Time   CALCIUM 9.7 05/26/2018 1413   CALCIUM 9.2 04/29/2017 1157   CALCIUM 10.0 10/29/2016 1021   ALKPHOS 72 05/26/2018 1413   ALKPHOS 64 04/29/2017 1157   ALKPHOS 71 10/29/2016 1021   AST 31 05/26/2018 1413   AST 24 10/29/2016 1021   ALT 36 05/26/2018 1413   ALT 37 04/29/2017 1157   ALT 26 10/29/2016 1021   BILITOT 0.8 05/26/2018 1413   BILITOT 1.18 10/29/2016 1021        Impression and Plan: [name] is 77 year old white female with history of synchronous bilateral breast cancer. Fortunately both breast cancers were node negative. They were ER positive and HER-2 negative.   Is now been about 15 years.  So far, everything is looking fantastic.  I see no evidence of recurrence.  We will plan to get her back in another 6 months.     Volanda Napoleon, MD 8/29/20193:03 PM

## 2018-05-27 ENCOUNTER — Encounter: Payer: Self-pay | Admitting: *Deleted

## 2018-06-07 DIAGNOSIS — R51 Headache: Secondary | ICD-10-CM | POA: Diagnosis not present

## 2018-06-07 DIAGNOSIS — M9901 Segmental and somatic dysfunction of cervical region: Secondary | ICD-10-CM | POA: Diagnosis not present

## 2018-06-07 DIAGNOSIS — M5033 Other cervical disc degeneration, cervicothoracic region: Secondary | ICD-10-CM | POA: Diagnosis not present

## 2018-06-07 DIAGNOSIS — M9902 Segmental and somatic dysfunction of thoracic region: Secondary | ICD-10-CM | POA: Diagnosis not present

## 2018-06-18 ENCOUNTER — Other Ambulatory Visit: Payer: Self-pay | Admitting: Internal Medicine

## 2018-06-28 DIAGNOSIS — R51 Headache: Secondary | ICD-10-CM | POA: Diagnosis not present

## 2018-06-28 DIAGNOSIS — M9902 Segmental and somatic dysfunction of thoracic region: Secondary | ICD-10-CM | POA: Diagnosis not present

## 2018-06-28 DIAGNOSIS — M9901 Segmental and somatic dysfunction of cervical region: Secondary | ICD-10-CM | POA: Diagnosis not present

## 2018-06-28 DIAGNOSIS — M5033 Other cervical disc degeneration, cervicothoracic region: Secondary | ICD-10-CM | POA: Diagnosis not present

## 2018-06-30 ENCOUNTER — Encounter: Payer: Self-pay | Admitting: Internal Medicine

## 2018-06-30 ENCOUNTER — Ambulatory Visit (INDEPENDENT_AMBULATORY_CARE_PROVIDER_SITE_OTHER): Payer: PPO | Admitting: Internal Medicine

## 2018-06-30 VITALS — BP 120/64 | HR 86 | Temp 97.7°F | Resp 14 | Ht 64.0 in | Wt 167.0 lb

## 2018-06-30 DIAGNOSIS — M791 Myalgia, unspecified site: Secondary | ICD-10-CM

## 2018-06-30 DIAGNOSIS — Z23 Encounter for immunization: Secondary | ICD-10-CM

## 2018-06-30 DIAGNOSIS — E039 Hypothyroidism, unspecified: Secondary | ICD-10-CM

## 2018-06-30 DIAGNOSIS — E782 Mixed hyperlipidemia: Secondary | ICD-10-CM

## 2018-06-30 DIAGNOSIS — E119 Type 2 diabetes mellitus without complications: Secondary | ICD-10-CM | POA: Diagnosis not present

## 2018-06-30 DIAGNOSIS — T466X5A Adverse effect of antihyperlipidemic and antiarteriosclerotic drugs, initial encounter: Secondary | ICD-10-CM

## 2018-06-30 LAB — LIPID PANEL
CHOLESTEROL: 251 mg/dL — AB (ref 0–200)
HDL: 83 mg/dL (ref >39.00)
LDL Cholesterol: 152 mg/dL — ABNORMAL HIGH (ref 0–99)
NonHDL: 168.39
TRIGLYCERIDES: 80 mg/dL (ref 0.0–149.0)
Total CHOL/HDL Ratio: 3
VLDL: 16 mg/dL (ref 0.0–40.0)

## 2018-06-30 LAB — COMPREHENSIVE METABOLIC PANEL
ALBUMIN: 4.3 g/dL (ref 3.5–5.2)
ALT: 27 U/L (ref 0–35)
AST: 25 U/L (ref 0–37)
Alkaline Phosphatase: 57 U/L (ref 39–117)
BUN: 10 mg/dL (ref 6–23)
CALCIUM: 9.6 mg/dL (ref 8.4–10.5)
CO2: 26 mEq/L (ref 19–32)
CREATININE: 0.89 mg/dL (ref 0.40–1.20)
Chloride: 105 mEq/L (ref 96–112)
GFR: 65.46 mL/min (ref >60.00)
Glucose, Bld: 145 mg/dL — ABNORMAL HIGH (ref 70–99)
POTASSIUM: 4 meq/L (ref 3.5–5.1)
Sodium: 138 mEq/L (ref 135–145)
Total Bilirubin: 1.3 mg/dL — ABNORMAL HIGH (ref 0.2–1.2)
Total Protein: 7 g/dL (ref 6.0–8.3)

## 2018-06-30 LAB — HEMOGLOBIN A1C: Hgb A1c MFr Bld: 6.3 % (ref 4.6–6.5)

## 2018-06-30 LAB — TSH: TSH: 1.9 u[IU]/mL (ref 0.35–4.50)

## 2018-06-30 MED ORDER — ZOSTER VAC RECOMB ADJUVANTED 50 MCG/0.5ML IM SUSR: 0.5000 mL | Freq: Once | INTRAMUSCULAR | 1 refills | Status: AC

## 2018-06-30 MED ORDER — IPRATROPIUM BROMIDE 0.06 % NA SOLN: 2.0000 | Freq: Three times a day (TID) | NASAL | 12 refills | Status: DC

## 2018-06-30 NOTE — Patient Instructions (Signed)
  To make a low carb chip :  Take the Joseph's Lavash or Pita bread,  Or the [place] Low carb whole wheat tortilla   Place on metal cookie sheet  Brush with olive oil  Sprinkle garlic powder (NOT garlic salt), grated parmesan cheese, mediterranean seasoning , or all of them?  Bake at 275 for 30 minutes   We have substitutions for your potatoes!!  Try the mashed cauliflower and riced cauliflower dishes instead of rice and mashed potatoes  Mashed turnips are also very low carb!   For desserts :  Try the [employer] Fit greek yogurt dessert flavors and top with reddi Whip . ( key lime, strawberry cheesecake, pumkpin pie, and tiramisu)  8 carbs,  80 calories  Try Oikos Triple Zero [place] Yogurt in the salted caramel, and the coffee flavors  With Whipped Cream for dessert  breyer's low carb ice cream, available in bars (on a stick, better ) or scoopable ice cream  HERE ARE THE LOW CARB  BREAD CHOICES

## 2018-06-30 NOTE — Progress Notes (Signed)
Subjective:  Patient ID: Jacqueline Mata, female    DOB: October 16, 1940  Age: 77 y.o. MRN: [id number]  CC: The primary encounter diagnosis was Diabetes mellitus type 2, diet-controlled ([place]). Diagnoses of Acquired hypothyroidism, Myalgia due to statin, Mixed hyperlipidemia, and Encounter for immunization were also pertinent to this visit.  HPI Jacqueline Mata presents for 6 month follow up on diet controlled diabetes.  Patient has no complaints today.  Patient is following a low glycemic index diet 90% of the time.   Fasting sugars have been  less than 120 most of the time and post prandials have been under 160 except on rare occasions. Patient is exercising about 3 times per week and intentionally trying to lose weight.  Patient has had an eye exam in the last 12 months and checks feet regularly for signs of infection.  Patient does not walk barefoot outside,  And denies an numbness tingling or burning in feet. Patient is up to date on all recommended vaccinations  Hi dose flu recommended  Wants shingrx  DISCUSSESD STOPPING ASPIRIN FOR  PRIMARY PREVENTION   Outpatient Medications Prior to Visit  Medication Sig Dispense Refill  . aspirin 81 MG tablet Take 81 mg by mouth daily.    . Blood Glucose Monitoring Suppl KIT by Does not apply route.    . cetirizine (ZYRTEC) 10 MG tablet Take 10 mg by mouth as needed.     . Cholecalciferol (VITAMIN D3) 1000 UNITS CAPS Take 2 capsules by mouth daily.     . fluocinonide cream (LIDEX) 6.27 % Apply 1 application topically daily as needed. Reported on 12/09/2015    . fluticasone (FLONASE) 50 MCG/ACT nasal spray Place into both nostrils daily. Place 2 spriay into both nostrils daily    . glucose blood (ACCU-CHEK AVIVA PLUS) test strip Use to check blood sugar once daily. 100 each 5  . Lancets (ACCU-CHEK MULTICLIX) lancets Use once daily to check blood sugars   Dx:E11.9 100 each 3  . levothyroxine (SYNTHROID, LEVOTHROID) 75 MCG tablet TAKE 1 TABLET(75 MCG) BY  MOUTH DAILY BEFORE BREAKFAST 90 tablet 0  . mometasone (NASONEX) 50 MCG/ACT nasal spray Place 2 sprays into the nose daily. 17 g 12  . Omega-3 Fatty Acids (FISH OIL) 1000 MG CAPS Take 2 capsules by mouth daily.    Marland Kitchen PREBIOTIC PRODUCT PO Take 1 scoop by mouth daily.    Marland Kitchen amoxicillin (AMOXIL) 500 MG capsule Take 1 capsule (500 mg total) by mouth 2 (two) times daily. (Patient not taking: Reported on 12/30/2017) 14 capsule 0  . metroNIDAZOLE (METROGEL) 1 % gel Apply 1 application topically daily.     No facility-administered medications prior to visit.     Review of Systems;  Patient denies headache, fevers, malaise, unintentional weight loss, skin rash, eye pain, sinus congestion and sinus pain, sore throat, dysphagia,  hemoptysis , cough, dyspnea, wheezing, chest pain, palpitations, orthopnea, edema, abdominal pain, nausea, melena, diarrhea, constipation, flank pain, dysuria, hematuria, urinary  Frequency, nocturia, numbness, tingling, seizures,  Focal weakness, Loss of consciousness,  Tremor, insomnia, depression, anxiety, and suicidal ideation.      Objective:  BP 120/64 (BP Location: Left Arm, Patient Position: Sitting, Cuff Size: Normal)   Pulse 86   Temp 97.7 F (36.5 C) (Oral)   Resp 14   Ht '5\' 4"'$  (1.626 m)   Wt 167 lb (75.8 kg)   SpO2 98%   BMI 28.67 kg/m   BP Readings from Last 3 Encounters:  06/30/18 120/64  05/26/18 120/75  12/30/17 114/66    Wt Readings from Last 3 Encounters:  06/30/18 167 lb (75.8 kg)  05/26/18 171 lb (77.6 kg)  12/30/17 165 lb 6.4 oz (75 kg)    General appearance: alert, cooperative and appears stated age Ears: normal TM's and external ear canals both ears Throat: lips, mucosa, and tongue normal; teeth and gums normal Neck: no adenopathy, no carotid bruit, supple, symmetrical, trachea midline and thyroid not enlarged, symmetric, no tenderness/mass/nodules Back: symmetric, no curvature. ROM normal. No CVA tenderness. Lungs: clear to auscultation  bilaterally Heart: regular rate and rhythm, S1, S2 normal, no murmur, click, rub or gallop Abdomen: soft, non-tender; bowel sounds normal; no masses,  no organomegaly Pulses: 2+ and symmetric Skin: Skin color, texture, turgor normal. No rashes or lesions Lymph nodes: Cervical, supraclavicular, and axillary nodes normal.  Lab Results  Component Value Date   HGBA1C 6.3 06/30/2018   HGBA1C 6.3 12/30/2017   HGBA1C 6.2 07/01/2017    Lab Results  Component Value Date   CREATININE 0.89 06/30/2018   CREATININE 0.60 05/26/2018   CREATININE 0.83 12/30/2017    Lab Results  Component Value Date   WBC 9.1 05/26/2018   HGB 14.8 05/26/2018   HCT 43.9 05/26/2018   PLT 175 05/26/2018   GLUCOSE 145 (H) 06/30/2018   CHOL 251 (H) 06/30/2018   TRIG 80.0 06/30/2018   HDL 83.00 06/30/2018   LDLDIRECT 122.0 07/01/2017   LDLCALC 152 (H) 06/30/2018   ALT 27 06/30/2018   AST 25 06/30/2018   NA 138 06/30/2018   K 4.0 06/30/2018   CL 105 06/30/2018   CREATININE 0.89 06/30/2018   BUN 10 06/30/2018   CO2 26 06/30/2018   TSH 1.90 06/30/2018   INR 1.0 03/14/2012   HGBA1C 6.3 06/30/2018   MICROALBUR <0.7 12/30/2017    Mr Cervical Spine Wo Contrast  Result Date: 08/05/2015 CLINICAL DATA:  Cervical spondylosis with myelopathy. Right arm pain EXAM: MRI CERVICAL SPINE WITHOUT CONTRAST TECHNIQUE: Multiplanar, multisequence MR imaging of the cervical spine was performed. No intravenous contrast was administered. COMPARISON:  None. FINDINGS: Normal cervical alignment. Mild anterior slip T1-2 and T2-3 consistent with disc and facet degeneration. Negative for fracture or mass. No bone marrow edema. Generous sized spinal canal. No cord compression. Spinal cord signal normal without cord lesion. Cervical medullary junction normal. C2-3:  Negative C3-4: Mild disc and mild facet degeneration without spinal or foraminal stenosis. C4-5: Mild disc degeneration and mild uncinate spurring. Bilateral facet hypertrophy.  Mild foraminal narrowing bilaterally. No spinal stenosis C5-6: Mild disc degeneration with uncinate spurring. Mild facet hypertrophy bilaterally. Mild foraminal narrowing bilaterally. C6-7: Moderate disc degeneration and spondylosis. Mild foraminal narrowing on the left due to uncinate spurring. C7-T1: Negative IMPRESSION: Mild cervical disc and facet degeneration at multiple levels. Mild foraminal narrowing bilaterally due to bony overgrowth. No focal disc protrusion. The patient has a generous sized spinal canal and there is no spinal stenosis or cord lesion. Electronically Signed   By: Franchot Gallo M.D.   On: 08/05/2015 10:12    Assessment & Plan:   Problem List Items Addressed This Visit    Acquired hypothyroidism    Thyroid function is WNL on current dose.  No current changes needed.   Lab Results  Component Value Date   TSH 1.90 06/30/2018         Relevant Orders   TSH (Completed)   Diabetes mellitus type 2, diet-controlled ([place]) - Primary    Excellent control with diet alone.  Patient is up-to-date on eye exams and foot exam is normal today. Patient has no evidence of microalbuminuria . Patient is intolerant of statin therapy for CAD risk reduction. She is taking a baby asa for primary prevention.   Lab Results  Component Value Date   HGBA1C 6.3 06/30/2018   Lab Results  Component Value Date   MICROALBUR <0.7 12/30/2017           Relevant Orders   Hemoglobin A1c (Completed)   Comprehensive metabolic panel (Completed)   Hyperlipidemia    Untreated secondary to statin myalgia.  She has no known histor yof atherosclerosis, CAD or PAD>  She has been advised to stop daily aspirin   Lab Results  Component Value Date   CHOL 251 (H) 06/30/2018   HDL 83.00 06/30/2018   LDLCALC 152 (H) 06/30/2018   LDLDIRECT 122.0 07/01/2017   TRIG 80.0 06/30/2018   CHOLHDL 3 06/30/2018         Relevant Orders   Lipid panel (Completed)   Myalgia due to statin    Other Visit  Diagnoses    Encounter for immunization       Relevant Orders   Flu vaccine HIGH DOSE PF (Completed)    A total of 25 minutes of face to face time was spent with patient more than half of which was spent in counselling about the above mentioned conditions  and coordination of care   I have discontinued Grayland Ormond. Sheils's amoxicillin and metroNIDAZOLE. I am also having her start on Zoster Vaccine Adjuvanted and ipratropium. Additionally, I am having her maintain her aspirin, Blood Glucose Monitoring Suppl, fluocinonide cream, fluticasone, Vitamin D3, accu-chek multiclix, cetirizine, Fish Oil, PREBIOTIC PRODUCT PO, glucose blood, mometasone, and levothyroxine.  Meds ordered this encounter  Medications  . Zoster Vaccine Adjuvanted [facility]) injection    Sig: Inject 0.5 mLs into the muscle once for 1 dose.    Dispense:  1 each    Refill:  1  . ipratropium (ATROVENT) 0.06 % nasal spray    Sig: Place 2 sprays into both nostrils 3 (three) times daily.    Dispense:  15 mL    Refill:  12    PLEASE FILL WITH GENERIC    Medications Discontinued During This Encounter  Medication Reason  . amoxicillin (AMOXIL) 500 MG capsule Completed Course  . metroNIDAZOLE (METROGEL) 1 % gel Change in therapy    Follow-up: Return in about 6 months (around 12/30/2018) for CPE/diabetes follow up.   [name], MD

## 2018-07-02 ENCOUNTER — Encounter: Payer: Self-pay | Admitting: Internal Medicine

## 2018-07-02 NOTE — Assessment & Plan Note (Signed)
Thyroid function is WNL on current dose.  No current changes needed.   Lab Results  Component Value Date   TSH 1.90 06/30/2018

## 2018-07-02 NOTE — Assessment & Plan Note (Addendum)
Untreated secondary to statin myalgia.  She has no known history of atherosclerosis, CAD or PAD.   She has been advised to stop daily aspirin   Lab Results  Component Value Date   CHOL 251 (H) 06/30/2018   HDL 83.00 06/30/2018   LDLCALC 152 (H) 06/30/2018   LDLDIRECT 122.0 07/01/2017   TRIG 80.0 06/30/2018   CHOLHDL 3 06/30/2018    

## 2018-07-02 NOTE — Assessment & Plan Note (Signed)
Excellent control with diet alone.   Patient is up-to-date on eye exams and foot exam is normal today. Patient has no evidence of microalbuminuria . Patient is intolerant of statin therapy for CAD risk reduction. She is taking a baby asa for primary prevention.   Lab Results  Component Value Date   HGBA1C 6.3 06/30/2018   Lab Results  Component Value Date   MICROALBUR <0.7 12/30/2017

## 2018-07-19 DIAGNOSIS — M5033 Other cervical disc degeneration, cervicothoracic region: Secondary | ICD-10-CM | POA: Diagnosis not present

## 2018-07-19 DIAGNOSIS — M9901 Segmental and somatic dysfunction of cervical region: Secondary | ICD-10-CM | POA: Diagnosis not present

## 2018-07-19 DIAGNOSIS — M9902 Segmental and somatic dysfunction of thoracic region: Secondary | ICD-10-CM | POA: Diagnosis not present

## 2018-07-19 DIAGNOSIS — R51 Headache: Secondary | ICD-10-CM | POA: Diagnosis not present

## 2018-08-10 DIAGNOSIS — M5033 Other cervical disc degeneration, cervicothoracic region: Secondary | ICD-10-CM | POA: Diagnosis not present

## 2018-08-10 DIAGNOSIS — R51 Headache: Secondary | ICD-10-CM | POA: Diagnosis not present

## 2018-08-10 DIAGNOSIS — M9901 Segmental and somatic dysfunction of cervical region: Secondary | ICD-10-CM | POA: Diagnosis not present

## 2018-08-10 DIAGNOSIS — M9902 Segmental and somatic dysfunction of thoracic region: Secondary | ICD-10-CM | POA: Diagnosis not present

## 2018-08-31 DIAGNOSIS — M9902 Segmental and somatic dysfunction of thoracic region: Secondary | ICD-10-CM | POA: Diagnosis not present

## 2018-08-31 DIAGNOSIS — M9901 Segmental and somatic dysfunction of cervical region: Secondary | ICD-10-CM | POA: Diagnosis not present

## 2018-08-31 DIAGNOSIS — R51 Headache: Secondary | ICD-10-CM | POA: Diagnosis not present

## 2018-08-31 DIAGNOSIS — M5033 Other cervical disc degeneration, cervicothoracic region: Secondary | ICD-10-CM | POA: Diagnosis not present

## 2018-09-13 DIAGNOSIS — M65332 Trigger finger, left middle finger: Secondary | ICD-10-CM | POA: Diagnosis not present

## 2018-09-13 DIAGNOSIS — Z96653 Presence of artificial knee joint, bilateral: Secondary | ICD-10-CM | POA: Diagnosis not present

## 2018-09-13 DIAGNOSIS — M65342 Trigger finger, left ring finger: Secondary | ICD-10-CM | POA: Diagnosis not present

## 2018-09-13 DIAGNOSIS — M170 Bilateral primary osteoarthritis of knee: Secondary | ICD-10-CM | POA: Diagnosis not present

## 2018-10-03 DIAGNOSIS — M9902 Segmental and somatic dysfunction of thoracic region: Secondary | ICD-10-CM | POA: Diagnosis not present

## 2018-10-03 DIAGNOSIS — M9901 Segmental and somatic dysfunction of cervical region: Secondary | ICD-10-CM | POA: Diagnosis not present

## 2018-10-03 DIAGNOSIS — M5033 Other cervical disc degeneration, cervicothoracic region: Secondary | ICD-10-CM | POA: Diagnosis not present

## 2018-10-03 DIAGNOSIS — R51 Headache: Secondary | ICD-10-CM | POA: Diagnosis not present

## 2018-10-19 DIAGNOSIS — L718 Other rosacea: Secondary | ICD-10-CM | POA: Diagnosis not present

## 2018-10-19 DIAGNOSIS — L304 Erythema intertrigo: Secondary | ICD-10-CM | POA: Diagnosis not present

## 2018-10-19 DIAGNOSIS — D1801 Hemangioma of skin and subcutaneous tissue: Secondary | ICD-10-CM | POA: Diagnosis not present

## 2018-10-19 DIAGNOSIS — Z85828 Personal history of other malignant neoplasm of skin: Secondary | ICD-10-CM | POA: Diagnosis not present

## 2018-10-19 DIAGNOSIS — Z08 Encounter for follow-up examination after completed treatment for malignant neoplasm: Secondary | ICD-10-CM | POA: Diagnosis not present

## 2018-10-19 DIAGNOSIS — L814 Other melanin hyperpigmentation: Secondary | ICD-10-CM | POA: Diagnosis not present

## 2018-10-25 ENCOUNTER — Other Ambulatory Visit: Payer: Self-pay | Admitting: Internal Medicine

## 2018-10-25 DIAGNOSIS — M5033 Other cervical disc degeneration, cervicothoracic region: Secondary | ICD-10-CM | POA: Diagnosis not present

## 2018-10-25 DIAGNOSIS — R51 Headache: Secondary | ICD-10-CM | POA: Diagnosis not present

## 2018-10-25 DIAGNOSIS — M9901 Segmental and somatic dysfunction of cervical region: Secondary | ICD-10-CM | POA: Diagnosis not present

## 2018-10-25 DIAGNOSIS — M9902 Segmental and somatic dysfunction of thoracic region: Secondary | ICD-10-CM | POA: Diagnosis not present

## 2018-11-09 DIAGNOSIS — R58 Hemorrhage, not elsewhere classified: Secondary | ICD-10-CM | POA: Diagnosis not present

## 2018-11-09 DIAGNOSIS — L538 Other specified erythematous conditions: Secondary | ICD-10-CM | POA: Diagnosis not present

## 2018-11-09 DIAGNOSIS — D225 Melanocytic nevi of trunk: Secondary | ICD-10-CM | POA: Diagnosis not present

## 2018-11-09 DIAGNOSIS — L820 Inflamed seborrheic keratosis: Secondary | ICD-10-CM | POA: Diagnosis not present

## 2018-11-15 DIAGNOSIS — M5033 Other cervical disc degeneration, cervicothoracic region: Secondary | ICD-10-CM | POA: Diagnosis not present

## 2018-11-15 DIAGNOSIS — R51 Headache: Secondary | ICD-10-CM | POA: Diagnosis not present

## 2018-11-15 DIAGNOSIS — M9902 Segmental and somatic dysfunction of thoracic region: Secondary | ICD-10-CM | POA: Diagnosis not present

## 2018-11-15 DIAGNOSIS — M9901 Segmental and somatic dysfunction of cervical region: Secondary | ICD-10-CM | POA: Diagnosis not present

## 2018-12-06 DIAGNOSIS — M9902 Segmental and somatic dysfunction of thoracic region: Secondary | ICD-10-CM | POA: Diagnosis not present

## 2018-12-06 DIAGNOSIS — M5033 Other cervical disc degeneration, cervicothoracic region: Secondary | ICD-10-CM | POA: Diagnosis not present

## 2018-12-06 DIAGNOSIS — R51 Headache: Secondary | ICD-10-CM | POA: Diagnosis not present

## 2018-12-06 DIAGNOSIS — M9901 Segmental and somatic dysfunction of cervical region: Secondary | ICD-10-CM | POA: Diagnosis not present

## 2018-12-08 ENCOUNTER — Inpatient Hospital Stay: Payer: PPO | Attending: Hematology & Oncology | Admitting: Hematology & Oncology

## 2018-12-08 ENCOUNTER — Inpatient Hospital Stay: Payer: PPO

## 2018-12-08 ENCOUNTER — Other Ambulatory Visit: Payer: Self-pay

## 2018-12-08 VITALS — BP 120/83 | HR 91 | Temp 98.0°F | Resp 18 | Wt 175.0 lb

## 2018-12-08 DIAGNOSIS — C50919 Malignant neoplasm of unspecified site of unspecified female breast: Secondary | ICD-10-CM

## 2018-12-08 DIAGNOSIS — Z853 Personal history of malignant neoplasm of breast: Secondary | ICD-10-CM

## 2018-12-08 DIAGNOSIS — C44712 Basal cell carcinoma of skin of right lower limb, including hip: Secondary | ICD-10-CM

## 2018-12-08 DIAGNOSIS — Z170 Estrogen receptor positive status [ER+]: Secondary | ICD-10-CM

## 2018-12-08 DIAGNOSIS — D0510 Intraductal carcinoma in situ of unspecified breast: Secondary | ICD-10-CM

## 2018-12-08 LAB — CMP (CANCER CENTER ONLY)
ALBUMIN: 4.6 g/dL (ref 3.5–5.0)
ALK PHOS: 66 U/L (ref 38–126)
ALT: 30 U/L (ref 0–44)
ANION GAP: 9 (ref 5–15)
AST: 25 U/L (ref 15–41)
BUN: 19 mg/dL (ref 8–23)
CALCIUM: 10.5 mg/dL — AB (ref 8.9–10.3)
CHLORIDE: 103 mmol/L (ref 98–111)
CO2: 28 mmol/L (ref 22–32)
Creatinine: 1.03 mg/dL — ABNORMAL HIGH (ref 0.44–1.00)
GFR, Estimated: 53 mL/min — ABNORMAL LOW (ref >60)
GLUCOSE: 120 mg/dL — AB (ref 70–99)
POTASSIUM: 3.9 mmol/L (ref 3.5–5.1)
SODIUM: 140 mmol/L (ref 135–145)
Total Bilirubin: 0.6 mg/dL (ref 0.3–1.2)
Total Protein: 7.4 g/dL (ref 6.5–8.1)

## 2018-12-08 LAB — CBC WITH DIFFERENTIAL (CANCER CENTER ONLY)
Abs Immature Granulocytes: 0.03 10*3/uL (ref 0.00–0.07)
BASOS ABS: 0.1 10*3/uL (ref 0.0–0.1)
BASOS PCT: 1 %
EOS ABS: 0.4 10*3/uL (ref 0.0–0.5)
EOS PCT: 4 %
HCT: 43.8 % (ref 36.0–46.0)
HEMOGLOBIN: 14.9 g/dL (ref 12.0–15.0)
Immature Granulocytes: 0 %
LYMPHS PCT: 28 %
Lymphs Abs: 2.8 10*3/uL (ref 0.7–4.0)
MCH: 31.2 pg (ref 26.0–34.0)
MCHC: 34 g/dL (ref 30.0–36.0)
MCV: 91.8 fL (ref 80.0–100.0)
MONO ABS: 0.5 10*3/uL (ref 0.1–1.0)
Monocytes Relative: 5 %
NRBC: 0 % (ref 0.0–0.2)
Neutro Abs: 6.2 10*3/uL (ref 1.7–7.7)
Neutrophils Relative %: 62 %
PLATELETS: 190 10*3/uL (ref 150–400)
RBC: 4.77 MIL/uL (ref 3.87–5.11)
RDW: 12.6 % (ref 11.5–15.5)
WBC: 10 10*3/uL (ref 4.0–10.5)

## 2018-12-08 NOTE — Progress Notes (Signed)
Hematology and Oncology Follow Up Visit  Jacqueline Mata [id number] March 25, 1941 78 y.o. 12/08/2018   Principle Diagnosis:  Synchronous bilateral stage IIA (T2 N0 M0) ductal carcinoma of bilateral breast - ER+/HER2-.  Current Therapy:    Observation     Interim History:  Ms.  Mata is comes in for followup.  She is doing quite well.  We last saw her about 6 months ago.  Everything is going well.  She really has no complaints.  She is trying to watch her blood sugars.  Has nobody else, she is worried about the coronavirus.  She does not really want to travel right now.  She has had no fever.  She has had no change in bowel or bladder habits.  She has had no nausea or vomiting.  There is been no rashes.  She has had no headache.  Her mammogram is due in May of this year.  Overall, her performance status is ECOG 0.    Medications:  Current Outpatient Medications:  .  Blood Glucose Monitoring Suppl KIT, by Does not apply route., Disp: , Rfl:  .  cetirizine (ZYRTEC) 10 MG tablet, Take 10 mg by mouth as needed. , Disp: , Rfl:  .  Cholecalciferol (VITAMIN D3) 1000 UNITS CAPS, Take 2 capsules by mouth daily. , Disp: , Rfl:  .  fluocinonide cream (LIDEX) 1.88 %, Apply 1 application topically daily as needed. Reported on 12/09/2015, Disp: , Rfl:  .  fluticasone (FLONASE) 50 MCG/ACT nasal spray, Place into both nostrils daily. Place 2 spriay into both nostrils daily, Disp: , Rfl:  .  glucose blood (ACCU-CHEK AVIVA PLUS) test strip, Use to check blood sugar once daily., Disp: 100 each, Rfl: 5 .  ipratropium (ATROVENT) 0.06 % nasal spray, Place 2 sprays into both nostrils 3 (three) times daily., Disp: 15 mL, Rfl: 12 .  Lancets (ACCU-CHEK MULTICLIX) lancets, Use once daily to check blood sugars   Dx:E11.9, Disp: 100 each, Rfl: 3 .  levothyroxine (SYNTHROID, LEVOTHROID) 75 MCG tablet, TAKE 1 TABLET(75 MCG) BY MOUTH DAILY BEFORE BREAKFAST, Disp: 90 tablet, Rfl: 0 .  PREBIOTIC PRODUCT PO, Take 1  scoop by mouth daily., Disp: , Rfl:   Allergies:  Allergies  Allergen Reactions  . Codeine Other (See Comments) and Shortness Of Breath    Altered mental staus  . Ilevro [Nepafenac]   . Lamisil [Terbinafine] Rash and Other (See Comments)    Dysphagia and skin fungus  . Other Swelling    ILLEVRO (EYE DROPS)- EYE SWELLING  DOGS.Congestion. FEATHERS.Congestion  . Mold Extract [Trichophyton Mentagrophyte]   . Molds & Smuts Other (See Comments)    Congestion.  Marland Kitchen Neomycin Rash  . Neomycin-Bacitracin Zn-Polymyx Rash  . Neomycin-Polymyxin-Hc Rash  . Tape Dermatitis    Blisters  . Tapentadol Nausea And Vomiting and Other (See Comments)  . Terbinafine Rash    Trouble swallowing  . Tramadol Other (See Comments), Nausea Only and Nausea And Vomiting    dizziness    Past Medical History, Surgical history, Social history, and Family History were reviewed and updated.  Review of Systems: Review of Systems  Constitutional: Negative.   HENT: Negative.   Eyes: Negative.   Respiratory: Negative.   Cardiovascular: Negative.   Gastrointestinal: Negative.   Genitourinary: Negative.   Musculoskeletal: Negative.   Skin: Negative.   Neurological: Negative.   Endo/Heme/Allergies: Negative.   Psychiatric/Behavioral: Negative.      Physical Exam:  weight is 175 lb (79.4 kg). Her oral temperature is  98 F (36.7 C). Her blood pressure is 120/83 and her pulse is 91. Her respiration is 18 and oxygen saturation is 99%.   Physical Exam Vitals signs reviewed.  HENT:     Head: Normocephalic and atraumatic.  Eyes:     Pupils: Pupils are equal, round, and reactive to light.  Neck:     Musculoskeletal: Normal range of motion.  Cardiovascular:     Rate and Rhythm: Normal rate and regular rhythm.     Heart sounds: Normal heart sounds.  Pulmonary:     Effort: Pulmonary effort is normal.     Breath sounds: Normal breath sounds.  Abdominal:     General: Bowel sounds are normal.      Palpations: Abdomen is soft.  Musculoskeletal: Normal range of motion.        General: No tenderness or deformity.  Lymphadenopathy:     Cervical: No cervical adenopathy.  Skin:    General: Skin is warm and dry.     Findings: No erythema or rash.  Neurological:     Mental Status: She is alert and oriented to person, place, and time.  Psychiatric:        Behavior: Behavior normal.        Thought Content: Thought content normal.        Judgment: Judgment normal.    Lab Results  Component Value Date   WBC 10.0 12/08/2018   HGB 14.9 12/08/2018   HCT 43.8 12/08/2018   MCV 91.8 12/08/2018   PLT 190 12/08/2018     Chemistry      Component Value Date/Time   NA 140 12/08/2018 1444   NA 135 04/29/2017 1157   NA 138 10/29/2016 1021   K 3.9 12/08/2018 1444   K 3.7 04/29/2017 1157   K 4.2 10/29/2016 1021   CL 103 12/08/2018 1444   CL 102 04/29/2017 1157   CO2 28 12/08/2018 1444   CO2 27 04/29/2017 1157   CO2 24 10/29/2016 1021   BUN 19 12/08/2018 1444   BUN 9 04/29/2017 1157   BUN 17.3 10/29/2016 1021   CREATININE 1.03 (H) 12/08/2018 1444   CREATININE 0.9 04/29/2017 1157   CREATININE 0.9 10/29/2016 1021      Component Value Date/Time   CALCIUM 10.5 (H) 12/08/2018 1444   CALCIUM 9.2 04/29/2017 1157   CALCIUM 10.0 10/29/2016 1021   ALKPHOS 66 12/08/2018 1444   ALKPHOS 64 04/29/2017 1157   ALKPHOS 71 10/29/2016 1021   AST 25 12/08/2018 1444   AST 24 10/29/2016 1021   ALT 30 12/08/2018 1444   ALT 37 04/29/2017 1157   ALT 26 10/29/2016 1021   BILITOT 0.6 12/08/2018 1444   BILITOT 1.18 10/29/2016 1021       Impression and Plan: Jacqueline Mata is 78 year old white female with history of synchronous bilateral breast cancer. Fortunately both breast cancers were node negative. They were ER positive and HER-2 negative.   Is now been about 16 years.  So far, everything is looking fantastic.  I see no evidence of recurrence.  We will plan to get her back in another 6 months.      Volanda Napoleon, MD 3/12/20203:19 PM

## 2018-12-28 ENCOUNTER — Ambulatory Visit: Payer: PPO | Admitting: Internal Medicine

## 2019-01-30 ENCOUNTER — Other Ambulatory Visit: Payer: Self-pay

## 2019-01-30 ENCOUNTER — Ambulatory Visit (INDEPENDENT_AMBULATORY_CARE_PROVIDER_SITE_OTHER): Payer: PPO | Admitting: Internal Medicine

## 2019-01-30 ENCOUNTER — Encounter: Payer: Self-pay | Admitting: Internal Medicine

## 2019-01-30 VITALS — Wt 169.0 lb

## 2019-01-30 DIAGNOSIS — Z7189 Other specified counseling: Secondary | ICD-10-CM | POA: Diagnosis not present

## 2019-01-30 DIAGNOSIS — E039 Hypothyroidism, unspecified: Secondary | ICD-10-CM | POA: Diagnosis not present

## 2019-01-30 DIAGNOSIS — E119 Type 2 diabetes mellitus without complications: Secondary | ICD-10-CM | POA: Diagnosis not present

## 2019-01-30 DIAGNOSIS — E782 Mixed hyperlipidemia: Secondary | ICD-10-CM

## 2019-01-30 MED ORDER — LEVOTHYROXINE SODIUM 75 MCG PO TABS: ORAL_TABLET | ORAL | 0 refills | Status: DC

## 2019-01-30 NOTE — Assessment & Plan Note (Signed)
Educated patient on the signs and symptoms of COVID-19 infection and ways to avoid the viral infection including washing hands frequently with soap and water,  using hand sanitizer if unable to wash, avoiding touching face,  staying at home and limiting visitors,  and avoiding contact with people coming in and out of home.  Reminded patient to call office with questions/concerns.  The importance of social distancing was discussed today 

## 2019-01-30 NOTE — Progress Notes (Signed)
Virtual Visit via [email or web address]  This visit type was conducted due to national recommendations for restrictions regarding the COVID-19 pandemic (e.g. social distancing).  This format is felt to be most appropriate for this patient at this time.  All issues noted in this document were discussed and addressed.  No physical exam was performed (except for noted visual exam findings with Video Visits).   I connected with@ on 01/30/19 at 11:00 AM EDT by a video enabled telemedicine application and verified that I am speaking with the correct person using two identifiers. Location patient: home Location provider: work  Persons participating in the virtual visit: patient, provider  I discussed the limitations, risks, security and privacy concerns of performing an evaluation and management service by telephone and the availability of in person appointments. I also discussed with the patient that there may be a patient responsible charge related to this service. The patient expressed understanding and agreed to proceed.   Reason for visit: 6 month follow up HPI:   bilateral BRCA s/p lumpectomoies in 2005. Mammogram due Best boy) at [facility]   Type 2 DM , diet controlled, and hyperlipidemia(statin intolerant), and hypothyroidism   Neck pain improved with change in activities  (No longer quilting,  But stil doing some seqing without bending neck) has not ben able to have appt with [place] and his massage therapist.    The patient has no signs or symptoms of COVID 19 infection (fever, cough, sore throat  or shortness of breath beyond what is typical for patient).  Patient denies contact with other persons with the above mentioned symptoms or with anyone confirmed to have COVID 19  6 month follow up on diabetes.  Patient has no complaints today.  Patient is following a low glycemic index diet .  sugars have been under less than 130 most of the time and post prandials have not been checked. . Patient is walking  for exercise nearly daily and intentionally trying to lose weight .  Patient has had an eye exam in the last 12 months and checks feet regularly for signs of infection.  Patient does not walk barefoot outside,  And denies any numbness tingling or burning in feet. Patient is up to date on all recommended vaccinations   ROS: See pertinent positives and negatives per HPI.  Past Medical History:  Diagnosis Date  . breast cancer   . Cataract 01/04/13 and 03/01/13  . Diabetes mellitus   . Hyperlipidemia   . hypothyroidism     Past Surgical History:  Procedure Laterality Date  . JOINT REPLACEMENT  2013   by Dr. Marry Guan    Family History  Problem Relation Age of Onset  . Cancer Sister        lung, former tobacco  . Cancer Brother 67       lung, thyroid, melanoma  . Cancer Mother 79       ovarian  . Diabetes Son     SOCIAL HX: single, lives alone IADL   Current Outpatient Medications:  .  Blood Glucose Monitoring Suppl KIT, by Does not apply route., Disp: , Rfl:  .  cetirizine (ZYRTEC) 10 MG tablet, Take 10 mg by mouth as needed. , Disp: , Rfl:  .  Cholecalciferol (VITAMIN D3) 1000 UNITS CAPS, Take 2 capsules by mouth daily. , Disp: , Rfl:  .  fluocinonide cream (LIDEX) 5.46 %, Apply 1 application topically daily as needed. Reported on 12/09/2015, Disp: , Rfl:  .  fluticasone (FLONASE) 50 MCG/ACT nasal  spray, Place into both nostrils daily. Place 2 spriay into both nostrils daily, Disp: , Rfl:  .  glucose blood (ACCU-CHEK AVIVA PLUS) test strip, Use to check blood sugar once daily., Disp: 100 each, Rfl: 5 .  Lancets (ACCU-CHEK MULTICLIX) lancets, Use once daily to check blood sugars   Dx:E11.9, Disp: 100 each, Rfl: 3 .  levothyroxine (SYNTHROID) 75 MCG tablet, TAKE 1 TABLET(75 MCG) BY MOUTH DAILY BEFORE BREAKFAST, Disp: 90 tablet, Rfl: 0 .  metroNIDAZOLE (METROGEL) 1 % gel, APP TOPICALLY TO FACE NIGHTLY, Disp: , Rfl:  .  PREBIOTIC PRODUCT PO, Take 1 scoop by mouth daily., Disp: , Rfl:  .   zinc gluconate 50 MG tablet, Take 50 mg by mouth daily., Disp: , Rfl:  .  ipratropium (ATROVENT) 0.06 % nasal spray, Place 2 sprays into both nostrils 3 (three) times daily. (Patient not taking: Reported on 01/30/2019), Disp: 15 mL, Rfl: 12  EXAM:  VITALS per patient if applicable:  GENERAL: alert, oriented, appears well and in no acute distress  HEENT: atraumatic, conjunttiva clear, no obvious abnormalities on inspection of external nose and ears  NECK: normal movements of the head and neck  LUNGS: on inspection no signs of respiratory distress, breathing rate appears normal, no obvious gross SOB, gasping or wheezing  CV: no obvious cyanosis  MS: moves all visible extremities without noticeable abnormality  PSYCH/NEURO: pleasant and cooperative, no obvious depression or anxiety, speech and thought processing grossly intact  ASSESSMENT AND PLAN:  Discussed the following assessment and plan:  Diabetes mellitus type 2, diet-controlled (HCC) - Plan: Hemoglobin A1c, Lipid panel, Microalbumin / creatinine urine ratio  Hypercalcemia - Plan: Basic metabolic panel, Calcium, ionized  Educated About Covid-19 Virus Infection  Acquired hypothyroidism - Plan: TSH  Mixed hyperlipidemia  Diabetes mellitus type 2, diet-controlled Historically has had excellent control with diet alone.   Patient is up-to-date on eye exams and foot exam ws normal in October.  Patient has no history of  microalbuminuria . Patient is intolerant of statin therapy for CAD risk reduction. She is taking a baby asa for primary prevention.   Lab Results  Component Value Date   HGBA1C 6.3 06/30/2018   Lab Results  Component Value Date   MICROALBUR <0.7 12/30/2017       Educated About Covid-19 Virus Infection Educated patient on the signs and symptoms of COVID-19 infection and ways to avoid the viral infection including washing hands frequently with soap and water,  using hand sanitizer if unable to wash,  avoiding touching face,  staying at home and limiting visitors,  and avoiding contact with people coming in and out of home.  Reminded patient to call office with questions/concerns.  The importance of social distancing was discussed today  Hyperlipidemia Untreated secondary to statin myalgia.  She has no known history of atherosclerosis, CAD or PAD.   She has been advised to stop daily aspirin   Lab Results  Component Value Date   CHOL 251 (H) 06/30/2018   HDL 83.00 06/30/2018   LDLCALC 152 (H) 06/30/2018   LDLDIRECT 122.0 07/01/2017   TRIG 80.0 06/30/2018   CHOLHDL 3 06/30/2018       I discussed the assessment and treatment plan with the patient. The patient was provided an opportunity to ask questions and all were answered. The patient agreed with the plan and demonstrated an understanding of the instructions.   The patient was advised to call back or seek an in-person evaluation if the symptoms worsen or  if the condition fails to improve as anticipated.  I provided  25 minutes of non-face-to-face time during this encounter.   [name], MD

## 2019-01-30 NOTE — Patient Instructions (Signed)
For your allergies ,  Resume a second generation antihistamines that are longer acting, non sedating and  available OTC:  Generic  Zyrtec, which is cetirizine.    generic Allegra , available generically as fexofenadine ; comes in 60 mg and 180 mg once daily strengths.    Generic Claritin :  also available as loratidine .

## 2019-01-30 NOTE — Assessment & Plan Note (Signed)
Untreated secondary to statin myalgia.  She has no known history of atherosclerosis, CAD or PAD.   She has been advised to stop daily aspirin   Lab Results  Component Value Date   CHOL 251 (H) 06/30/2018   HDL 83.00 06/30/2018   LDLCALC 152 (H) 06/30/2018   LDLDIRECT 122.0 07/01/2017   TRIG 80.0 06/30/2018   CHOLHDL 3 06/30/2018

## 2019-01-30 NOTE — Assessment & Plan Note (Signed)
Historically has had excellent control with diet alone.   Patient is up-to-date on eye exams and foot exam ws normal in October.  Patient has no history of  microalbuminuria . Patient is intolerant of statin therapy for CAD risk reduction. She is taking a baby asa for primary prevention.   Lab Results  Component Value Date   HGBA1C 6.3 06/30/2018   Lab Results  Component Value Date   MICROALBUR <0.7 12/30/2017

## 2019-02-02 ENCOUNTER — Ambulatory Visit: Payer: PPO | Admitting: Internal Medicine

## 2019-02-07 ENCOUNTER — Other Ambulatory Visit: Payer: Self-pay

## 2019-02-07 ENCOUNTER — Other Ambulatory Visit (INDEPENDENT_AMBULATORY_CARE_PROVIDER_SITE_OTHER): Payer: PPO

## 2019-02-07 DIAGNOSIS — E039 Hypothyroidism, unspecified: Secondary | ICD-10-CM | POA: Diagnosis not present

## 2019-02-07 DIAGNOSIS — E119 Type 2 diabetes mellitus without complications: Secondary | ICD-10-CM | POA: Diagnosis not present

## 2019-02-07 LAB — LIPID PANEL
Cholesterol: 237 mg/dL — ABNORMAL HIGH (ref 0–200)
HDL: 79.5 mg/dL (ref >39.00)
LDL Cholesterol: 141 mg/dL — ABNORMAL HIGH (ref 0–99)
NonHDL: 157.16
Total CHOL/HDL Ratio: 3
Triglycerides: 79 mg/dL (ref 0.0–149.0)
VLDL: 15.8 mg/dL (ref 0.0–40.0)

## 2019-02-07 LAB — BASIC METABOLIC PANEL
BUN: 10 mg/dL (ref 6–23)
CO2: 22 mEq/L (ref 19–32)
Calcium: 9.3 mg/dL (ref 8.4–10.5)
Chloride: 105 mEq/L (ref 96–112)
Creatinine, Ser: 0.9 mg/dL (ref 0.40–1.20)
GFR: 60.7 mL/min (ref >60.00)
Glucose, Bld: 138 mg/dL — ABNORMAL HIGH (ref 70–99)
Potassium: 4.1 mEq/L (ref 3.5–5.1)
Sodium: 138 mEq/L (ref 135–145)

## 2019-02-07 LAB — MICROALBUMIN / CREATININE URINE RATIO
Creatinine,U: 53.5 mg/dL
Microalb Creat Ratio: 1.3 mg/g (ref 0.0–30.0)
Microalb, Ur: <0.7 mg/dL (ref 0.0–1.9)

## 2019-02-07 LAB — HEMOGLOBIN A1C: Hgb A1c MFr Bld: 6.9 % — ABNORMAL HIGH (ref 4.6–6.5)

## 2019-02-07 LAB — TSH: TSH: 4.82 u[IU]/mL — ABNORMAL HIGH (ref 0.35–4.50)

## 2019-02-08 ENCOUNTER — Other Ambulatory Visit: Payer: PPO

## 2019-02-08 ENCOUNTER — Other Ambulatory Visit: Payer: Self-pay | Admitting: Internal Medicine

## 2019-02-08 DIAGNOSIS — E039 Hypothyroidism, unspecified: Secondary | ICD-10-CM

## 2019-02-08 LAB — CALCIUM, IONIZED: Calcium, Ion: 5.14 mg/dL (ref 4.8–5.6)

## 2019-03-21 ENCOUNTER — Other Ambulatory Visit: Payer: Self-pay | Admitting: Internal Medicine

## 2019-03-21 DIAGNOSIS — E782 Mixed hyperlipidemia: Secondary | ICD-10-CM

## 2019-03-21 DIAGNOSIS — E119 Type 2 diabetes mellitus without complications: Secondary | ICD-10-CM

## 2019-03-21 DIAGNOSIS — E039 Hypothyroidism, unspecified: Secondary | ICD-10-CM

## 2019-03-21 MED ORDER — LEVOTHYROXINE SODIUM 88 MCG PO TABS: ORAL_TABLET | ORAL | 0 refills | Status: DC

## 2019-03-23 ENCOUNTER — Other Ambulatory Visit: Payer: Self-pay | Admitting: Internal Medicine

## 2019-03-23 DIAGNOSIS — Z1231 Encounter for screening mammogram for malignant neoplasm of breast: Secondary | ICD-10-CM

## 2019-04-19 DIAGNOSIS — C44619 Basal cell carcinoma of skin of left upper limb, including shoulder: Secondary | ICD-10-CM | POA: Diagnosis not present

## 2019-04-19 DIAGNOSIS — D485 Neoplasm of uncertain behavior of skin: Secondary | ICD-10-CM | POA: Diagnosis not present

## 2019-04-19 DIAGNOSIS — Z85828 Personal history of other malignant neoplasm of skin: Secondary | ICD-10-CM | POA: Diagnosis not present

## 2019-04-19 DIAGNOSIS — Z08 Encounter for follow-up examination after completed treatment for malignant neoplasm: Secondary | ICD-10-CM | POA: Diagnosis not present

## 2019-05-08 DIAGNOSIS — Z1231 Encounter for screening mammogram for malignant neoplasm of breast: Secondary | ICD-10-CM | POA: Diagnosis not present

## 2019-05-08 DIAGNOSIS — Z803 Family history of malignant neoplasm of breast: Secondary | ICD-10-CM | POA: Diagnosis not present

## 2019-05-08 LAB — HM MAMMOGRAPHY

## 2019-05-10 DIAGNOSIS — C44619 Basal cell carcinoma of skin of left upper limb, including shoulder: Secondary | ICD-10-CM | POA: Diagnosis not present

## 2019-05-20 ENCOUNTER — Other Ambulatory Visit: Payer: Self-pay | Admitting: Internal Medicine

## 2019-05-20 MED ORDER — LEVOTHYROXINE SODIUM 88 MCG PO TABS: ORAL_TABLET | ORAL | 0 refills | Status: DC

## 2019-05-29 ENCOUNTER — Encounter: Payer: Self-pay | Admitting: Hematology & Oncology

## 2019-06-15 ENCOUNTER — Inpatient Hospital Stay: Payer: PPO | Attending: Hematology & Oncology | Admitting: Hematology & Oncology

## 2019-06-15 ENCOUNTER — Encounter: Payer: Self-pay | Admitting: Hematology & Oncology

## 2019-06-15 ENCOUNTER — Inpatient Hospital Stay: Payer: PPO

## 2019-06-15 ENCOUNTER — Other Ambulatory Visit: Payer: Self-pay

## 2019-06-15 VITALS — BP 150/84 | HR 94 | Temp 96.9°F | Resp 19 | Wt 175.0 lb

## 2019-06-15 DIAGNOSIS — D0510 Intraductal carcinoma in situ of unspecified breast: Secondary | ICD-10-CM

## 2019-06-15 DIAGNOSIS — Z853 Personal history of malignant neoplasm of breast: Secondary | ICD-10-CM | POA: Insufficient documentation

## 2019-06-15 DIAGNOSIS — Z79899 Other long term (current) drug therapy: Secondary | ICD-10-CM | POA: Insufficient documentation

## 2019-06-15 LAB — CMP (CANCER CENTER ONLY)
ALT: 32 U/L (ref 0–44)
AST: 22 U/L (ref 15–41)
Albumin: 4.3 g/dL (ref 3.5–5.0)
Alkaline Phosphatase: 62 U/L (ref 38–126)
Anion gap: 9 (ref 5–15)
BUN: 13 mg/dL (ref 8–23)
CO2: 25 mmol/L (ref 22–32)
Calcium: 10.1 mg/dL (ref 8.9–10.3)
Chloride: 105 mmol/L (ref 98–111)
Creatinine: 0.91 mg/dL (ref 0.44–1.00)
GFR, Est AFR Am: >60 mL/min (ref >60)
GFR, Estimated: >60 mL/min (ref >60)
Glucose, Bld: 130 mg/dL — ABNORMAL HIGH (ref 70–99)
Potassium: 4 mmol/L (ref 3.5–5.1)
Sodium: 139 mmol/L (ref 135–145)
Total Bilirubin: 0.8 mg/dL (ref 0.3–1.2)
Total Protein: 7 g/dL (ref 6.5–8.1)

## 2019-06-15 LAB — CBC WITH DIFFERENTIAL (CANCER CENTER ONLY)
Abs Immature Granulocytes: 0.03 10*3/uL (ref 0.00–0.07)
Basophils Absolute: 0.1 10*3/uL (ref 0.0–0.1)
Basophils Relative: 1 %
Eosinophils Absolute: 0.3 10*3/uL (ref 0.0–0.5)
Eosinophils Relative: 3 %
HCT: 43 % (ref 36.0–46.0)
Hemoglobin: 14.3 g/dL (ref 12.0–15.0)
Immature Granulocytes: 0 %
Lymphocytes Relative: 24 %
Lymphs Abs: 2.2 10*3/uL (ref 0.7–4.0)
MCH: 30.4 pg (ref 26.0–34.0)
MCHC: 33.3 g/dL (ref 30.0–36.0)
MCV: 91.5 fL (ref 80.0–100.0)
Monocytes Absolute: 0.5 10*3/uL (ref 0.1–1.0)
Monocytes Relative: 6 %
Neutro Abs: 5.9 10*3/uL (ref 1.7–7.7)
Neutrophils Relative %: 66 %
Platelet Count: 178 10*3/uL (ref 150–400)
RBC: 4.7 MIL/uL (ref 3.87–5.11)
RDW: 12.8 % (ref 11.5–15.5)
WBC Count: 9 10*3/uL (ref 4.0–10.5)
nRBC: 0 % (ref 0.0–0.2)

## 2019-06-15 NOTE — Progress Notes (Signed)
Hematology and Oncology Follow Up Visit  Jacqueline Mata [id number] [birth year] 78 y.o. 06/15/2019   Principle Diagnosis:  Synchronous bilateral stage IIA (T2 N0 M0) ductal carcinoma of bilateral breast - ER+/HER2-.  Current Therapy:    Observation     Interim History:  Ms.  Jacqueline Mata is comes in for followup.  She is doing quite well.  We last saw her about 6 months ago.  Everything is going well.  She really has no complaints.  She is trying to watch her blood sugars.  She is exercising a little bit more.  She did lose 1 of her dogs since we last saw her.  This is been somewhat of a tough time for her.  She is being very cautious with the coronavirus.  She has had no problems with nausea or vomiting.  There is been no change in bowel or bladder habits.  She has had no rashes.  There is been no leg swelling.  She had her mammogram done back in August.  Everything looked okay from what I can see.  Overall, her performance status is ECOG 0.    Medications:  Current Outpatient Medications:  .  Blood Glucose Monitoring Suppl KIT, by Does not apply route., Disp: , Rfl:  .  cetirizine (ZYRTEC) 10 MG tablet, Take 10 mg by mouth as needed. , Disp: , Rfl:  .  Cholecalciferol (VITAMIN D3) 1000 UNITS CAPS, Take 2 capsules by mouth daily. , Disp: , Rfl:  .  fluocinonide cream (LIDEX) 0.32 %, Apply 1 application topically daily as needed. Reported on 12/09/2015, Disp: , Rfl:  .  fluticasone (FLONASE) 50 MCG/ACT nasal spray, Place into both nostrils daily. Place 2 spriay into both nostrils daily, Disp: , Rfl:  .  glucose blood (ACCU-CHEK AVIVA PLUS) test strip, Use to check blood sugar once daily., Disp: 100 each, Rfl: 5 .  ipratropium (ATROVENT) 0.06 % nasal spray, Place 2 sprays into both nostrils 3 (three) times daily. (Patient not taking: Reported on 01/30/2019), Disp: 15 mL, Rfl: 12 .  Lancets (ACCU-CHEK MULTICLIX) lancets, Use once daily to check blood sugars   Dx:E11.9, Disp: 100 each, Rfl:  3 .  levothyroxine (SYNTHROID) 88 MCG tablet, TAKE 1 TABLET(75 MCG) BY MOUTH DAILY BEFORE BREAKFAST, Disp: 90 tablet, Rfl: 0 .  metroNIDAZOLE (METROGEL) 1 % gel, APP TOPICALLY TO FACE NIGHTLY, Disp: , Rfl:  .  nystatin (MYCOSTATIN) 100000 UNIT/ML suspension, RINSE WITH 5 ML PO FOR 2 MINUTES 4 TO 5 XD AND SPIT, Disp: , Rfl:  .  PREBIOTIC PRODUCT PO, Take 1 scoop by mouth daily., Disp: , Rfl:  .  zinc gluconate 50 MG tablet, Take 50 mg by mouth daily., Disp: , Rfl:   Allergies:  Allergies  Allergen Reactions  . Codeine Other (See Comments) and Shortness Of Breath    Altered mental staus  . Ilevro [Nepafenac]   . Lamisil [Terbinafine] Rash and Other (See Comments)    Dysphagia and skin fungus  . Other Swelling    ILLEVRO (EYE DROPS)- EYE SWELLING  DOGS.Congestion. FEATHERS.Congestion  . Mold Extract [Trichophyton Mentagrophyte]   . Molds & Smuts Other (See Comments)    Congestion.  Marland Kitchen Neomycin Rash  . Neomycin-Bacitracin Zn-Polymyx Rash  . Neomycin-Polymyxin-Hc Rash  . Tape Dermatitis    Blisters  . Tapentadol Nausea And Vomiting and Other (See Comments)  . Terbinafine Rash    Trouble swallowing  . Tramadol Other (See Comments), Nausea Only and Nausea And Vomiting    dizziness  Past Medical History, Surgical history, Social history, and Family History were reviewed and updated.  Review of Systems: Review of Systems  Constitutional: Negative.   HENT: Negative.   Eyes: Negative.   Respiratory: Negative.   Cardiovascular: Negative.   Gastrointestinal: Negative.   Genitourinary: Negative.   Musculoskeletal: Negative.   Skin: Negative.   Neurological: Negative.   Endo/Heme/Allergies: Negative.   Psychiatric/Behavioral: Negative.      Physical Exam:  weight is 175 lb (79.4 kg). Her oral temperature is 96.9 F (36.1 C) (abnormal). Her blood pressure is 150/84 (abnormal) and her pulse is 94. Her respiration is 19 and oxygen saturation is 98%.   Physical  Exam Vitals signs reviewed.  HENT:     Head: Normocephalic and atraumatic.  Eyes:     Pupils: Pupils are equal, round, and reactive to light.  Neck:     Musculoskeletal: Normal range of motion.  Cardiovascular:     Rate and Rhythm: Normal rate and regular rhythm.     Heart sounds: Normal heart sounds.  Pulmonary:     Effort: Pulmonary effort is normal.     Breath sounds: Normal breath sounds.  Abdominal:     General: Bowel sounds are normal.     Palpations: Abdomen is soft.  Musculoskeletal: Normal range of motion.        General: No tenderness or deformity.  Lymphadenopathy:     Cervical: No cervical adenopathy.  Skin:    General: Skin is warm and dry.     Findings: No erythema or rash.  Neurological:     Mental Status: She is alert and oriented to person, place, and time.  Psychiatric:        Behavior: Behavior normal.        Thought Content: Thought content normal.        Judgment: Judgment normal.    Lab Results  Component Value Date   WBC 9.0 06/15/2019   HGB 14.3 06/15/2019   HCT 43.0 06/15/2019   MCV 91.5 06/15/2019   PLT 178 06/15/2019     Chemistry      Component Value Date/Time   NA 139 06/15/2019 1136   NA 135 04/29/2017 1157   NA 138 10/29/2016 1021   K 4.0 06/15/2019 1136   K 3.7 04/29/2017 1157   K 4.2 10/29/2016 1021   CL 105 06/15/2019 1136   CL 102 04/29/2017 1157   CO2 25 06/15/2019 1136   CO2 27 04/29/2017 1157   CO2 24 10/29/2016 1021   BUN 13 06/15/2019 1136   BUN 9 04/29/2017 1157   BUN 17.3 10/29/2016 1021   CREATININE 0.91 06/15/2019 1136   CREATININE 0.9 04/29/2017 1157   CREATININE 0.9 10/29/2016 1021      Component Value Date/Time   CALCIUM 10.1 06/15/2019 1136   CALCIUM 9.2 04/29/2017 1157   CALCIUM 10.0 10/29/2016 1021   ALKPHOS 62 06/15/2019 1136   ALKPHOS 64 04/29/2017 1157   ALKPHOS 71 10/29/2016 1021   AST 22 06/15/2019 1136   AST 24 10/29/2016 1021   ALT 32 06/15/2019 1136   ALT 37 04/29/2017 1157   ALT 26  10/29/2016 1021   BILITOT 0.8 06/15/2019 1136   BILITOT 1.18 10/29/2016 1021       Impression and Plan: Jacqueline Mata is 78 year old white female with history of synchronous bilateral breast cancer. Fortunately both breast cancers were node negative. They were ER positive and HER-2 negative.   It has now been about 16 years since she  was treated for the breast cancer.  I really have to believe that she is going to be cured since her cancers were node negative.  We will plan to get her back in another 6 months.     Volanda Napoleon, MD 9/17/202012:32 PM

## 2019-06-22 DIAGNOSIS — H0101A Ulcerative blepharitis right eye, upper and lower eyelids: Secondary | ICD-10-CM | POA: Diagnosis not present

## 2019-06-22 LAB — HM DIABETES EYE EXAM

## 2019-06-30 ENCOUNTER — Ambulatory Visit (INDEPENDENT_AMBULATORY_CARE_PROVIDER_SITE_OTHER): Payer: PPO

## 2019-06-30 ENCOUNTER — Other Ambulatory Visit: Payer: Self-pay

## 2019-06-30 DIAGNOSIS — Z0000 Encounter for general adult medical examination without abnormal findings: Secondary | ICD-10-CM | POA: Diagnosis not present

## 2019-06-30 NOTE — Patient Instructions (Addendum)
  Jacqueline Mata , Thank you for taking time to come for your Medicare Wellness Visit. I appreciate your ongoing commitment to your health goals. Please review the following plan we discussed and let me know if I can assist you in the future.   These are the goals we discussed: Goals      Patient Stated   . Increase physical activity (pt-stated)     Walk more for exercise       This is a list of the screening recommended for you and due dates:  Health Maintenance  Topic Date Due  . Complete foot exam   12/31/2018  . Hemoglobin A1C  08/10/2019  . Urine Protein Check  02/07/2020  . Mammogram  05/07/2020  . Eye exam for diabetics  06/21/2020  . Tetanus Vaccine  08/01/2022  . Flu Shot  Completed  . DEXA scan (bone density measurement)  Completed  . Pneumonia vaccines  Completed

## 2019-06-30 NOTE — Progress Notes (Signed)
Subjective:   Jacqueline Mata is a 78 y.o. female who presents for Medicare Annual (Subsequent) preventive examination.  Review of Systems:  No ROS.  Medicare Wellness Virtual Visit.  Visual/audio telehealth visit, UTA vital signs.   See social history for additional risk factors.   Cardiac Risk Factors include: advanced age (>50mn, >>54women);diabetes mellitus     Objective:     Vitals: There were no vitals taken for this visit.  There is no height or weight on file to calculate BMI.  Advanced Directives 06/30/2019 06/15/2019 12/08/2018 05/26/2018 04/29/2017 12/09/2015 10/24/2015  Does Patient Have a Medical Advance Directive? No No Yes No No Yes No  Type of Advance Directive - - - - - HPress photographerLiving will -  Does patient want to make changes to medical advance directive? - - No - Patient declined - - No - Patient declined -  Copy of [place]? - - - - - No - copy requested -  Would patient like information on creating a medical advance directive? No - Patient declined No - Patient declined - - - - No - patient declined information    Tobacco Social History   Tobacco Use  Smoking Status Former Smoker  . Packs/day: 1.00  . Years: 19.00  . Pack years: 19.00  . Types: Cigarettes  . Start date: 01/02/1960  . Quit date: 07/22/1978  . Years since quitting: 40.9  Smokeless Tobacco Never Used  Tobacco Comment   quit 36 years ago     Counseling given: Not Answered Comment: quit 36 years ago   Clinical Intake:  Pre-visit preparation completed: Yes        Diabetes: Yes(Followed by pcp)  How often do you need to have someone help you when you read instructions, pamphlets, or other written materials from your doctor or pharmacy?: 1 - Never  Interpreter Needed?: No     Past Medical History:  Diagnosis Date  . breast cancer   . Cataract 01/04/13 and 03/01/13  . Diabetes mellitus   . Hyperlipidemia   . hypothyroidism     Past Surgical History:  Procedure Laterality Date  . JOINT REPLACEMENT  2013   by Dr. hMarry Guan  Family History  Problem Relation Age of Onset  . Cancer Sister        lung, former tobacco  . Cancer Brother 715      lung, thyroid, melanoma  . Cancer Mother 616      ovarian  . Diabetes Son    Social History   Socioeconomic History  . Marital status: Divorced    Spouse name: Not on file  . Number of children: Not on file  . Years of education: Not on file  . Highest education level: Not on file  Occupational History  . Not on file  Social Needs  . Financial resource strain: Not hard at all  . Food insecurity    Worry: Never true    Inability: Never true  . Transportation needs    Medical: No    Non-medical: No  Tobacco Use  . Smoking status: Former Smoker    Packs/day: 1.00    Years: 19.00    Pack years: 19.00    Types: Cigarettes    Start date: 01/02/1960    Quit date: 07/22/1978    Years since quitting: 40.9  . Smokeless tobacco: Never Used  . Tobacco comment: quit 36 years ago  Substance and  Sexual Activity  . Alcohol use: Yes    Alcohol/week: 0.0 standard drinks    Comment: rare  . Drug use: No  . Sexual activity: Never  Lifestyle  . Physical activity    Days per week: Not on file    Minutes per session: Not on file  . Stress: Not at all  Relationships  . Social Herbalist on phone: Not on file    Gets together: Not on file    Attends religious service: Not on file    Active member of club or organization: Not on file    Attends meetings of clubs or organizations: Not on file    Relationship status: Not on file  Other Topics Concern  . Not on file  Social History Narrative  . Not on file    Outpatient Encounter Medications as of 06/30/2019  Medication Sig  . Blood Glucose Monitoring Suppl KIT by Does not apply route.  . cetirizine (ZYRTEC) 10 MG tablet Take 10 mg by mouth as needed.   . Cholecalciferol (VITAMIN D3) 1000 UNITS CAPS Take 2  capsules by mouth daily.   . fluocinonide cream (LIDEX) 5.42 % Apply 1 application topically daily as needed. Reported on 12/09/2015  . fluticasone (FLONASE) 50 MCG/ACT nasal spray Place into both nostrils daily. Place 2 spriay into both nostrils daily  . glucose blood (ACCU-CHEK AVIVA PLUS) test strip Use to check blood sugar once daily.  Marland Kitchen ipratropium (ATROVENT) 0.06 % nasal spray Place 2 sprays into both nostrils 3 (three) times daily.  . Lancets (ACCU-CHEK MULTICLIX) lancets Use once daily to check blood sugars   Dx:E11.9  . levothyroxine (SYNTHROID) 88 MCG tablet TAKE 1 TABLET(75 MCG) BY MOUTH DAILY BEFORE BREAKFAST  . metroNIDAZOLE (METROGEL) 1 % gel APP TOPICALLY TO FACE NIGHTLY  . nystatin (MYCOSTATIN) 100000 UNIT/ML suspension RINSE WITH 5 ML PO FOR 2 MINUTES 4 TO 5 XD AND SPIT  . PREBIOTIC PRODUCT PO Take 1 scoop by mouth daily.  Marland Kitchen zinc gluconate 50 MG tablet Take 50 mg by mouth daily.   No facility-administered encounter medications on file as of 06/30/2019.     Activities of Daily Living In your present state of health, do you have any difficulty performing the following activities: 06/30/2019  Hearing? N  Vision? N  Difficulty concentrating or making decisions? Y  Comment Notes she has difficulty remembering.  Walking or climbing stairs? N  Dressing or bathing? N  Doing errands, shopping? N  Preparing Food and eating ? N  Using the Toilet? N  In the past six months, have you accidently leaked urine? Y  Comment Managed with a daily liner. Followed by pcp.  Do you have problems with loss of bowel control? N  Managing your Medications? N  Managing your Finances? N  Housekeeping or managing your Housekeeping? N  Some recent data might be hidden    Patient Care Team: [name], MD as PCP - General (Internal Medicine)    Assessment:   This is a routine wellness examination for Jacqueline Mata.  I connected with patient 06/30/19 at 11:00 AM EDT by an audio enabled  telemedicine application and verified that I am speaking with the correct person using two identifiers. Patient stated full name and DOB. Patient gave permission to continue with virtual visit. Patient's location was at home and Nurse's location was at [place] office.   Health Maintenance Due: -Foot Exam- followed by pcp -Hgb A1c- 02/07/19 (6.9) Update all pending maintenance  due as appropriate.   See completed HM at the end of note.   Eye: Visual acuity not assessed. Virtual visit. Wears corrective lenses. Followed by their ophthalmologist every 12 months.  Retinopathy- none reported  Dental: Visits every 12 months.    Hearing: Demonstrates normal hearing during visit.  Safety:  Patient feels safe at home- yes Patient does have smoke detectors at home- yes Patient does wear sunscreen or protective clothing when in direct sunlight - yes Patient does wear seat belt when in a moving vehicle - yes Patient drives- yes Adequate lighting in walkways free from debris- yes Grab bars and handrails used as appropriate- yes Ambulates with no assistive device Cell phone on person when ambulating outside of the home- yes  Social: Alcohol intake - yes      Smoking history- former   Smokers in home? none Illicit drug use? none  Depression: PHQ 2 &9 complete. See screening below. Denies irritability, anhedonia, sadness/tearfullness.  Stable.   Falls: See screening below.    Medication: Taking as directed and without issues.   Covid-19: Precautions and sickness symptoms discussed. Wears mask, social distancing, hand hygiene as appropriate.   Activities of Daily Living Patient denies needing assistance with: household chores, feeding themselves, getting from bed to chair, getting to the toilet, bathing/showering, dressing, managing money, or preparing meals.   Memory: Patient is alert. Patient denies difficulty focusing or concentrating. Correctly identified the president of the  [place], season and recall.  BMI- discussed the importance of a healthy diet, water intake and the benefits of aerobic exercise.  Educational material provided.  Physical activity- no routine. She plans to start walking to a dvd indoors.   Diet: Low carb Water: good intake  Other Providers Patient Care Team: [name], MD as PCP - General (Internal Medicine)  Exercise Activities and Dietary recommendations Current Exercise Habits: The patient does not participate in regular exercise at present  Goals      Patient Stated   . Increase physical activity (pt-stated)     Walk more for exercise       Fall Risk Fall Risk  06/30/2019 12/31/2016 12/09/2015 10/24/2015 04/03/2015  Falls in the past year? 0 No No No No    Timed Get Up and Go performed: no, virtual visit  Depression Screen PHQ 2/9 Scores 06/30/2019 12/31/2016 12/09/2015 10/15/2014  PHQ - 2 Score 0 1 0 0     Cognitive Function MMSE - Mini Mental State Exam 12/09/2015  Orientation to time 5  Orientation to Place 5  Registration 3  Attention/ Calculation 5  Recall 3  Language- name 2 objects 2  Language- repeat 1  Language- follow 3 step command 3  Language- read & follow direction 1  Write a sentence 1  Copy design 1  Total score 30     6CIT Screen 06/30/2019  What Year? 0 points  What month? 0 points  What time? 0 points  Count back from 20 0 points  Months in reverse 0 points  Repeat phrase 0 points  Total Score 0    Immunization History  Administered Date(s) Administered  . Influenza, High Dose Seasonal PF 07/01/2017, 06/30/2018, 06/19/2019  . Influenza,inj,Quad PF,6+ Mos 07/28/2013, 07/18/2014  . Influenza-Unspecified 07/04/2015, 07/30/2016  . Pneumococcal Conjugate-13 05/21/2014  . Pneumococcal Polysaccharide-23 09/29/2003, 02/12/2012  . Tdap 08/01/2012  . Zoster 09/28/2009  . Zoster Recombinat (Shingrix) 07/26/2018, 12/02/2018   Screening Tests Health Maintenance  Topic Date Due  . FOOT EXAM  12/31/2018  . HEMOGLOBIN A1C  08/10/2019  . URINE MICROALBUMIN  02/07/2020  . MAMMOGRAM  05/07/2020  . OPHTHALMOLOGY EXAM  06/21/2020  . TETANUS/TDAP  08/01/2022  . INFLUENZA VACCINE  Completed  . DEXA SCAN  Completed  . PNA vac Low Risk Adult  Completed      Plan:   Keep all routine maintenance appointments.   Follow up 07/31/19, 6 month follow up.  Medicare Attestation I have personally reviewed: The patient's medical and social history Their use of alcohol, tobacco or illicit drugs Their current medications and supplements The patient's functional ability including ADLs,fall risks, home safety risks, cognitive, and hearing and visual impairment Diet and physical activities Evidence for depression   In addition, I have reviewed and discussed with patient certain preventive protocols, quality metrics, and best practice recommendations. A written personalized care plan for preventive services as well as general preventive health recommendations were provided to patient via mail.     Varney Biles, LPN  02/28/6947

## 2019-07-31 ENCOUNTER — Other Ambulatory Visit: Payer: Self-pay

## 2019-07-31 ENCOUNTER — Ambulatory Visit (INDEPENDENT_AMBULATORY_CARE_PROVIDER_SITE_OTHER): Payer: PPO | Admitting: Internal Medicine

## 2019-07-31 ENCOUNTER — Encounter: Payer: Self-pay | Admitting: Internal Medicine

## 2019-07-31 VITALS — Ht 64.0 in | Wt 173.5 lb

## 2019-07-31 DIAGNOSIS — E782 Mixed hyperlipidemia: Secondary | ICD-10-CM | POA: Diagnosis not present

## 2019-07-31 DIAGNOSIS — R4189 Other symptoms and signs involving cognitive functions and awareness: Secondary | ICD-10-CM | POA: Diagnosis not present

## 2019-07-31 DIAGNOSIS — R413 Other amnesia: Secondary | ICD-10-CM

## 2019-07-31 DIAGNOSIS — E119 Type 2 diabetes mellitus without complications: Secondary | ICD-10-CM | POA: Diagnosis not present

## 2019-07-31 DIAGNOSIS — E039 Hypothyroidism, unspecified: Secondary | ICD-10-CM | POA: Diagnosis not present

## 2019-07-31 DIAGNOSIS — R4181 Age-related cognitive decline: Secondary | ICD-10-CM

## 2019-07-31 NOTE — Patient Instructions (Signed)
Return for fasting labs  MRI brain ordered  Referral to Daiva Nakayama , MD [facility] neurology)

## 2019-07-31 NOTE — Assessment & Plan Note (Signed)
Screening for reversible causes advised, along with MRI brain and referral to neurology

## 2019-07-31 NOTE — Progress Notes (Signed)
Virtual Visit via [email or web address]  This visit type was conducted due to national recommendations for restrictions regarding the COVID-19 pandemic (e.g. social distancing).  This format is felt to be most appropriate for this patient at this time.  All issues noted in this document were discussed and addressed.  No physical exam was performed (except for noted visual exam findings with Video Visits).   I connected with@ on 07/31/19 at  4:00 PM EST by a video enabled telemedicine application  and verified that I am speaking with the correct person using two identifiers. Location patient: home Location provider: work or home office Persons participating in the virtual visit: patient, provider  I discussed the limitations, risks, security and privacy concerns of performing an evaluation and management service by telephone and the availability of in person appointments. I also discussed with the patient that there may be a patient responsible charge related to this service. The patient exprSessed understanding and agreed to proceed.  Reason for visit: declining short term memory  HPI:  78 yr old female with T2 DM,  History of breast ca, presents with increased short term memory loss over the last several months.  She has been forgetting names,  But not Scripturess,  Having  Difficulty retaining thoughts,  Recently Left the kitchen sink on when she went outside to the garage during a cleanup.  Has gotten lost several times while driving so has resorted to using Google maps.  Denies depression  .  Has multiple social contacts that she maintains.   ROS: See pertinent positives and negatives per HPI.  Past Medical History:  Diagnosis Date  . breast cancer   . Cataract 01/04/13 and 03/01/13  . Diabetes mellitus   . Hyperlipidemia   . hypothyroidism     Past Surgical History:  Procedure Laterality Date  . JOINT REPLACEMENT  2013   by Dr. Marry Guan    Family History  Problem Relation Age of Onset  .  Cancer Sister        lung, former tobacco  . Cancer Brother 79       lung, thyroid, melanoma  . Cancer Mother 67       ovarian  . Diabetes Son     SOCIAL HX:  reports that she quit smoking about 41 years ago. Her smoking use included cigarettes. She started smoking about 59 years ago. She has a 19.00 pack-year smoking history. She has never used smokeless tobacco. She reports current alcohol use. She reports that she does not use drugs.   Current Outpatient Medications:  .  Blood Glucose Monitoring Suppl KIT, by Does not apply route., Disp: , Rfl:  .  cetirizine (ZYRTEC) 10 MG tablet, Take 10 mg by mouth as needed. , Disp: , Rfl:  .  Cholecalciferol (VITAMIN D3) 1000 UNITS CAPS, Take 2 capsules by mouth daily. , Disp: , Rfl:  .  fluocinonide cream (LIDEX) 8.29 %, Apply 1 application topically daily as needed. Reported on 12/09/2015, Disp: , Rfl:  .  folic acid (FOLVITE) 562 MCG tablet, Take 400 mcg by mouth daily., Disp: , Rfl:  .  glucose blood (ACCU-CHEK AVIVA PLUS) test strip, Use to check blood sugar once daily., Disp: 100 each, Rfl: 5 .  Lancets (ACCU-CHEK MULTICLIX) lancets, Use once daily to check blood sugars   Dx:E11.9, Disp: 100 each, Rfl: 3 .  levothyroxine (SYNTHROID) 88 MCG tablet, TAKE 1 TABLET(75 MCG) BY MOUTH DAILY BEFORE BREAKFAST, Disp: 90 tablet, Rfl: 0 .  metroNIDAZOLE (METROGEL) 1 %  gel, APP TOPICALLY TO FACE NIGHTLY, Disp: , Rfl:  .  PREBIOTIC PRODUCT PO, Take 1 scoop by mouth daily., Disp: , Rfl:  .  zinc gluconate 50 MG tablet, Take 50 mg by mouth daily., Disp: , Rfl:   EXAM:  VITALS per patient if applicable:  GENERAL: alert, oriented, appears well and in no acute distress  HEENT: atraumatic, conjunttiva clear, no obvious abnormalities on inspection of external nose and ears  NECK: normal movements of the head and neck  LUNGS: on inspection no signs of respiratory distress, breathing rate appears normal, no obvious gross SOB, gasping or wheezing  CV: no  obvious cyanosis  MS: moves all visible extremities without noticeable abnormality  PSYCH/NEURO: pleasant and cooperative, no obvious depression or anxiety, speech and thought processing grossly intac  ASSESSMENT AND PLAN:  Discussed the following assessment and plan:  Cognitive change - Plan: TSH, Vitamin B12, RBC Folate, RPR, CBC with Differential/Platelet, HIV antibody (with reflex)  Acquired hypothyroidism - Plan: TSH  Mixed hyperlipidemia - Plan: Lipid panel  Diabetes mellitus type 2, diet-controlled (HCC) - Plan: Hemoglobin A1c, Comprehensive metabolic panel  Age-related cognitive decline - Plan: MR Brain Wo Contrast  Short-term memory loss  Short-term memory loss Screening for reversible causes advised, along with MRI brain and referral to neurology    I discussed the assessment and treatment plan with the patient. The patient was provided an opportunity to ask questions and all were answered. The patient agreed with the plan and demonstrated an understanding of the instructions.   The patient was advised to call back or seek an in-person evaluation if the symptoms worsen or if the condition fails to improve as anticipated.  I provided  25 minutes of non-face-to-face time during this encounter reviewing patient's current problems and post surgeries.  Providing counseling on the above mentioned problems , and coordination  of care .   Jacqueline Mc, MD

## 2019-08-16 ENCOUNTER — Other Ambulatory Visit: Payer: Self-pay | Admitting: Internal Medicine

## 2019-08-16 MED ORDER — ALPRAZOLAM 0.5 MG PO TBDP: 0.5000 mg | ORAL_TABLET | Freq: Every day | ORAL | 0 refills | Status: DC | PRN

## 2019-08-18 ENCOUNTER — Telehealth: Payer: Self-pay | Admitting: Internal Medicine

## 2019-08-18 MED ORDER — ALPRAZOLAM 0.5 MG PO TABS: 0.5000 mg | ORAL_TABLET | Freq: Two times a day (BID) | ORAL | 0 refills | Status: DC | PRN

## 2019-08-18 NOTE — Telephone Encounter (Signed)
Jacqueline Mata calling from [facility] in [place] calling regarding the ALPRAZolam (NIRAVAM) 0.5 MG dissolvable tablet [id number]   The pharmacy does not have the dissolvable tablets. Calling to see if this can be switched before the close of business.  Patient has an MRI tomorrow and has anxiety.  CB- [phone]

## 2019-08-18 NOTE — Telephone Encounter (Signed)
The other alprazolam has been sent to her pharmacy

## 2019-08-19 ENCOUNTER — Other Ambulatory Visit: Payer: Self-pay

## 2019-08-19 ENCOUNTER — Ambulatory Visit
Admission: RE | Admit: 2019-08-19 | Discharge: 2019-08-19 | Disposition: A | Discharge status: Home / Self Care | Payer: PPO | Source: Ambulatory Visit | Attending: Internal Medicine | Admitting: Internal Medicine

## 2019-08-19 DIAGNOSIS — R4181 Age-related cognitive decline: Secondary | ICD-10-CM | POA: Diagnosis not present

## 2019-08-19 DIAGNOSIS — R413 Other amnesia: Secondary | ICD-10-CM | POA: Diagnosis not present

## 2019-08-31 DIAGNOSIS — M65312 Trigger thumb, left thumb: Secondary | ICD-10-CM | POA: Diagnosis not present

## 2019-08-31 DIAGNOSIS — Z96653 Presence of artificial knee joint, bilateral: Secondary | ICD-10-CM | POA: Diagnosis not present

## 2019-08-31 DIAGNOSIS — M65331 Trigger finger, right middle finger: Secondary | ICD-10-CM | POA: Diagnosis not present

## 2019-08-31 DIAGNOSIS — M170 Bilateral primary osteoarthritis of knee: Secondary | ICD-10-CM | POA: Diagnosis not present

## 2019-09-25 DIAGNOSIS — S46011A Strain of muscle(s) and tendon(s) of the rotator cuff of right shoulder, initial encounter: Secondary | ICD-10-CM | POA: Diagnosis not present

## 2019-09-26 ENCOUNTER — Telehealth: Payer: Self-pay | Admitting: Internal Medicine

## 2019-09-26 MED ORDER — LEVOTHYROXINE SODIUM 88 MCG PO TABS: ORAL_TABLET | ORAL | 0 refills | Status: DC

## 2019-09-26 NOTE — Telephone Encounter (Signed)
Pt is due to have lab work done on 10/04/2019. Medication has been refilled.

## 2019-09-26 NOTE — Telephone Encounter (Signed)
Pt needs refill on levothyroxine (SYNTHROID) 88 MCG tablet. Newborn

## 2019-10-04 ENCOUNTER — Other Ambulatory Visit: Payer: PPO

## 2019-10-09 ENCOUNTER — Other Ambulatory Visit: Payer: Self-pay

## 2019-10-11 ENCOUNTER — Other Ambulatory Visit: Payer: Self-pay

## 2019-10-11 ENCOUNTER — Other Ambulatory Visit (INDEPENDENT_AMBULATORY_CARE_PROVIDER_SITE_OTHER): Payer: PPO

## 2019-10-11 DIAGNOSIS — E039 Hypothyroidism, unspecified: Secondary | ICD-10-CM

## 2019-10-11 DIAGNOSIS — E119 Type 2 diabetes mellitus without complications: Secondary | ICD-10-CM

## 2019-10-11 DIAGNOSIS — E782 Mixed hyperlipidemia: Secondary | ICD-10-CM | POA: Diagnosis not present

## 2019-10-11 DIAGNOSIS — R4189 Other symptoms and signs involving cognitive functions and awareness: Secondary | ICD-10-CM | POA: Diagnosis not present

## 2019-10-11 LAB — CBC WITH DIFFERENTIAL/PLATELET
Basophils Absolute: 0.1 K/uL (ref 0.0–0.1)
Basophils Relative: 2 % (ref 0.0–3.0)
Eosinophils Absolute: 0.2 K/uL (ref 0.0–0.7)
Eosinophils Relative: 3.6 % (ref 0.0–5.0)
HCT: 43.3 % (ref 36.0–46.0)
Hemoglobin: 14.4 g/dL (ref 12.0–15.0)
Lymphocytes Relative: 27.8 % (ref 12.0–46.0)
Lymphs Abs: 1.8 K/uL (ref 0.7–4.0)
MCHC: 33.3 g/dL (ref 30.0–36.0)
MCV: 92.4 fl (ref 78.0–100.0)
Monocytes Absolute: 0.4 K/uL (ref 0.1–1.0)
Monocytes Relative: 5.8 % (ref 3.0–12.0)
Neutro Abs: 3.9 K/uL (ref 1.4–7.7)
Neutrophils Relative %: 60.8 % (ref 43.0–77.0)
Platelets: 191 K/uL (ref 150.0–400.0)
RBC: 4.69 Mil/uL (ref 3.87–5.11)
RDW: 13.7 % (ref 11.5–15.5)
WBC: 6.4 K/uL (ref 4.0–10.5)

## 2019-10-11 LAB — TSH: TSH: 1.69 u[IU]/mL (ref 0.35–4.50)

## 2019-10-11 LAB — HEMOGLOBIN A1C: Hgb A1c MFr Bld: 6.7 % — ABNORMAL HIGH (ref 4.6–6.5)

## 2019-10-11 LAB — COMPREHENSIVE METABOLIC PANEL WITH GFR
ALT: 35 U/L (ref 0–35)
AST: 22 U/L (ref 0–37)
Albumin: 4.5 g/dL (ref 3.5–5.2)
Alkaline Phosphatase: 58 U/L (ref 39–117)
BUN: 11 mg/dL (ref 6–23)
CO2: 22 meq/L (ref 19–32)
Calcium: 9.8 mg/dL (ref 8.4–10.5)
Chloride: 104 meq/L (ref 96–112)
Creatinine, Ser: 0.95 mg/dL (ref 0.40–1.20)
GFR: 56.93 mL/min — ABNORMAL LOW
Glucose, Bld: 137 mg/dL — ABNORMAL HIGH (ref 70–99)
Potassium: 4.1 meq/L (ref 3.5–5.1)
Sodium: 135 meq/L (ref 135–145)
Total Bilirubin: 1.2 mg/dL (ref 0.2–1.2)
Total Protein: 7.3 g/dL (ref 6.0–8.3)

## 2019-10-11 LAB — VITAMIN B12: Vitamin B-12: 434 pg/mL (ref 211–911)

## 2019-10-11 LAB — LIPID PANEL
Cholesterol: 240 mg/dL — ABNORMAL HIGH (ref 0–200)
HDL: 83.2 mg/dL
LDL Cholesterol: 139 mg/dL — ABNORMAL HIGH (ref 0–99)
NonHDL: 157.25
Total CHOL/HDL Ratio: 3
Triglycerides: 93 mg/dL (ref 0.0–149.0)
VLDL: 18.6 mg/dL (ref 0.0–40.0)

## 2019-10-12 DIAGNOSIS — S46011A Strain of muscle(s) and tendon(s) of the rotator cuff of right shoulder, initial encounter: Secondary | ICD-10-CM | POA: Diagnosis not present

## 2019-10-12 LAB — FOLATE RBC: RBC Folate: 724 ng/mL RBC (ref >280)

## 2019-10-12 LAB — RPR: RPR Ser Ql: NONREACTIVE

## 2019-10-26 DIAGNOSIS — S46011A Strain of muscle(s) and tendon(s) of the rotator cuff of right shoulder, initial encounter: Secondary | ICD-10-CM | POA: Diagnosis not present

## 2019-10-31 DIAGNOSIS — S46011D Strain of muscle(s) and tendon(s) of the rotator cuff of right shoulder, subsequent encounter: Secondary | ICD-10-CM | POA: Diagnosis not present

## 2019-11-06 DIAGNOSIS — S46011D Strain of muscle(s) and tendon(s) of the rotator cuff of right shoulder, subsequent encounter: Secondary | ICD-10-CM | POA: Diagnosis not present

## 2019-11-09 DIAGNOSIS — S46011A Strain of muscle(s) and tendon(s) of the rotator cuff of right shoulder, initial encounter: Secondary | ICD-10-CM | POA: Diagnosis not present

## 2019-11-09 DIAGNOSIS — S46011D Strain of muscle(s) and tendon(s) of the rotator cuff of right shoulder, subsequent encounter: Secondary | ICD-10-CM | POA: Diagnosis not present

## 2019-11-13 DIAGNOSIS — S46011D Strain of muscle(s) and tendon(s) of the rotator cuff of right shoulder, subsequent encounter: Secondary | ICD-10-CM | POA: Diagnosis not present

## 2019-11-15 DIAGNOSIS — L718 Other rosacea: Secondary | ICD-10-CM | POA: Diagnosis not present

## 2019-11-15 DIAGNOSIS — L821 Other seborrheic keratosis: Secondary | ICD-10-CM | POA: Diagnosis not present

## 2019-11-15 DIAGNOSIS — D485 Neoplasm of uncertain behavior of skin: Secondary | ICD-10-CM | POA: Diagnosis not present

## 2019-11-15 DIAGNOSIS — Z85828 Personal history of other malignant neoplasm of skin: Secondary | ICD-10-CM | POA: Diagnosis not present

## 2019-11-15 DIAGNOSIS — Z08 Encounter for follow-up examination after completed treatment for malignant neoplasm: Secondary | ICD-10-CM | POA: Diagnosis not present

## 2019-11-16 DIAGNOSIS — D235 Other benign neoplasm of skin of trunk: Secondary | ICD-10-CM | POA: Diagnosis not present

## 2019-11-21 DIAGNOSIS — S46011D Strain of muscle(s) and tendon(s) of the rotator cuff of right shoulder, subsequent encounter: Secondary | ICD-10-CM | POA: Diagnosis not present

## 2019-11-23 DIAGNOSIS — S46011D Strain of muscle(s) and tendon(s) of the rotator cuff of right shoulder, subsequent encounter: Secondary | ICD-10-CM | POA: Diagnosis not present

## 2019-11-28 DIAGNOSIS — S46011D Strain of muscle(s) and tendon(s) of the rotator cuff of right shoulder, subsequent encounter: Secondary | ICD-10-CM | POA: Diagnosis not present

## 2019-12-01 DIAGNOSIS — L538 Other specified erythematous conditions: Secondary | ICD-10-CM | POA: Diagnosis not present

## 2019-12-01 DIAGNOSIS — D235 Other benign neoplasm of skin of trunk: Secondary | ICD-10-CM | POA: Diagnosis not present

## 2019-12-06 DIAGNOSIS — S46011D Strain of muscle(s) and tendon(s) of the rotator cuff of right shoulder, subsequent encounter: Secondary | ICD-10-CM | POA: Diagnosis not present

## 2019-12-19 ENCOUNTER — Inpatient Hospital Stay: Payer: PPO

## 2019-12-19 ENCOUNTER — Telehealth: Payer: Self-pay | Admitting: Family

## 2019-12-19 ENCOUNTER — Inpatient Hospital Stay: Payer: PPO | Attending: Hematology & Oncology | Admitting: Family

## 2019-12-19 ENCOUNTER — Other Ambulatory Visit: Payer: Self-pay

## 2019-12-19 ENCOUNTER — Encounter: Payer: Self-pay | Admitting: Family

## 2019-12-19 VITALS — BP 132/91 | HR 90 | Temp 97.1°F | Resp 18 | Ht 64.0 in | Wt 179.4 lb

## 2019-12-19 DIAGNOSIS — Z9181 History of falling: Secondary | ICD-10-CM | POA: Insufficient documentation

## 2019-12-19 DIAGNOSIS — D0510 Intraductal carcinoma in situ of unspecified breast: Secondary | ICD-10-CM

## 2019-12-19 DIAGNOSIS — Z853 Personal history of malignant neoplasm of breast: Secondary | ICD-10-CM | POA: Insufficient documentation

## 2019-12-19 LAB — CMP (CANCER CENTER ONLY)
ALT: 26 U/L (ref 0–44)
AST: 21 U/L (ref 15–41)
Albumin: 4.6 g/dL (ref 3.5–5.0)
Alkaline Phosphatase: 69 U/L (ref 38–126)
Anion gap: 9 (ref 5–15)
BUN: 20 mg/dL (ref 8–23)
CO2: 25 mmol/L (ref 22–32)
Calcium: 10.2 mg/dL (ref 8.9–10.3)
Chloride: 103 mmol/L (ref 98–111)
Creatinine: 0.97 mg/dL (ref 0.44–1.00)
GFR, Est AFR Am: >60 mL/min (ref >60)
GFR, Estimated: 56 mL/min — ABNORMAL LOW (ref >60)
Glucose, Bld: 125 mg/dL — ABNORMAL HIGH (ref 70–99)
Potassium: 4 mmol/L (ref 3.5–5.1)
Sodium: 137 mmol/L (ref 135–145)
Total Bilirubin: 1.1 mg/dL (ref 0.3–1.2)
Total Protein: 7.3 g/dL (ref 6.5–8.1)

## 2019-12-19 LAB — CBC WITH DIFFERENTIAL (CANCER CENTER ONLY)
Abs Immature Granulocytes: 0.04 10*3/uL (ref 0.00–0.07)
Basophils Absolute: 0.1 10*3/uL (ref 0.0–0.1)
Basophils Relative: 1 %
Eosinophils Absolute: 0.3 10*3/uL (ref 0.0–0.5)
Eosinophils Relative: 3 %
HCT: 44.1 % (ref 36.0–46.0)
Hemoglobin: 15 g/dL (ref 12.0–15.0)
Immature Granulocytes: 0 %
Lymphocytes Relative: 24 %
Lymphs Abs: 2.6 10*3/uL (ref 0.7–4.0)
MCH: 31.1 pg (ref 26.0–34.0)
MCHC: 34 g/dL (ref 30.0–36.0)
MCV: 91.5 fL (ref 80.0–100.0)
Monocytes Absolute: 0.7 10*3/uL (ref 0.1–1.0)
Monocytes Relative: 6 %
Neutro Abs: 7 10*3/uL (ref 1.7–7.7)
Neutrophils Relative %: 66 %
Platelet Count: 194 10*3/uL (ref 150–400)
RBC: 4.82 MIL/uL (ref 3.87–5.11)
RDW: 13 % (ref 11.5–15.5)
WBC Count: 10.9 10*3/uL — ABNORMAL HIGH (ref 4.0–10.5)
nRBC: 0 % (ref 0.0–0.2)

## 2019-12-19 NOTE — Telephone Encounter (Signed)
Appointments scheduled calendar printed per 3/23 los 

## 2019-12-19 NOTE — Progress Notes (Signed)
Hematology and Oncology Follow Up Visit  Jacqueline Mata [id number] 11-10-1940 79 y.o. 12/19/2019   Principle Diagnosis:  Synchronous bilateral stage IIA (T2 N0 M0) ductal carcinoma of bilateral breast - ER+/HER2-  Current Therapy:   Observation   Interim History:  [name] is here today for follow-up. She is doing well now but states that she had a fall 2 days before Christmas and hit her right shoulder. She is currently in PT and has noticed improvement. She has also noted that her neck pain has improved.  No new falls to report. No syncopal episodes.  No swelling, tendenress, numbness or tingling in her extremities.  She has dizziness at times due to vertigo (she has seen ENT and has fluid in the eustachian tubes).  No fever, chills, n/v, cough, rash, SOB, chest pain, palpitations, abdominal pain or changes in bowel or bladder habits.  Her bilateral breast exam today was negative. No mass, lesion or rash, noted.  Mammogram 04/2019 was negative.  No lymphadenopathy noted on exam.  She is eating well and hydrating properly daily. Her weight is stable.   ECOG Performance Status: 0 - Asymptomatic  Medications:  Allergies as of 12/19/2019      Reactions   Codeine Other (See Comments), Shortness Of Breath   Altered mental staus   Ilevro [nepafenac]    Lamisil [terbinafine] Rash, Other (See Comments)   Dysphagia and skin fungus   Other Swelling   ILLEVRO (EYE DROPS)- EYE SWELLING DOGS.Congestion. FEATHERS.Congestion   Mold Extract [trichophyton Mentagrophyte]    Molds & Smuts Other (See Comments)   Congestion.   Neomycin Rash   Neomycin-bacitracin Zn-polymyx Rash   Neomycin-polymyxin-hc Rash   Tape Dermatitis   Blisters   Tapentadol Nausea And Vomiting, Other (See Comments)   Terbinafine Rash   Trouble swallowing   Tramadol Other (See Comments), Nausea Only, Nausea And Vomiting   dizziness      Medication List       Accurate as of December 19, 2019 12:11 PM. If  you have any questions, ask your nurse or doctor.        accu-chek multiclix lancets Use once daily to check blood sugars   Dx:E11.9   ALPRAZolam 0.5 MG tablet Commonly known as: Xanax Take 1 tablet (0.5 mg total) by mouth 2 (two) times daily as needed for anxiety.   Blood Glucose Monitoring Suppl Kit by Does not apply route.   cetirizine 10 MG tablet Commonly known as: ZYRTEC Take 10 mg by mouth as needed.   fluocinonide cream 0.05 % Commonly known as: LIDEX Apply 1 application topically daily as needed. Reported on 4/96/7591   folic acid 638 MCG tablet Commonly known as: FOLVITE Take 400 mcg by mouth daily.   glucose blood test strip Commonly known as: Accu-Chek Aviva Plus Use to check blood sugar once daily.   levothyroxine 88 MCG tablet Commonly known as: SYNTHROID TAKE 1 TABLET(75 MCG) BY MOUTH DAILY BEFORE BREAKFAST   metroNIDAZOLE 1 % gel Commonly known as: METROGEL APP TOPICALLY TO FACE NIGHTLY   PREBIOTIC PRODUCT PO Take 1 scoop by mouth daily.   Vitamin D3 25 MCG (1000 UT) Caps Take 2 capsules by mouth daily.   zinc gluconate 50 MG tablet Take 50 mg by mouth daily.       Allergies:  Allergies  Allergen Reactions  . Codeine Other (See Comments) and Shortness Of Breath    Altered mental staus  . Ilevro [Nepafenac]   . Lamisil [Terbinafine] Rash and  Other (See Comments)    Dysphagia and skin fungus  . Other Swelling    ILLEVRO (EYE DROPS)- EYE SWELLING  DOGS.Congestion. FEATHERS.Congestion  . Mold Extract [Trichophyton Mentagrophyte]   . Molds & Smuts Other (See Comments)    Congestion.  Marland Kitchen Neomycin Rash  . Neomycin-Bacitracin Zn-Polymyx Rash  . Neomycin-Polymyxin-Hc Rash  . Tape Dermatitis    Blisters  . Tapentadol Nausea And Vomiting and Other (See Comments)  . Terbinafine Rash    Trouble swallowing  . Tramadol Other (See Comments), Nausea Only and Nausea And Vomiting    dizziness    Past Medical History, Surgical history,  Social history, and Family History were reviewed and updated.  Review of Systems: All other 10 point review of systems is negative.   Physical Exam:  vitals were not taken for this visit.   Wt Readings from Last 3 Encounters:  07/31/19 173 lb 8 oz (78.7 kg)  06/15/19 175 lb (79.4 kg)  01/30/19 169 lb (76.7 kg)    Ocular: Sclerae unicteric, pupils equal, round and reactive to light Ear-nose-throat: Oropharynx clear, dentition fair Lymphatic: No cervical, supraclavicular or axillary adenopathy Lungs no rales or rhonchi, good excursion bilaterally Heart regular rate and rhythm, no murmur appreciated Abd soft, nontender, positive bowel sounds, no liver or spleen tip palpated on exam, no fluid wave MSK no focal spinal tenderness, no joint edema Neuro: non-focal, well-oriented, appropriate affect Breasts: No changes on bilateral breast exam. No mass, lesion or rash noted.   Lab Results  Component Value Date   WBC 10.9 (H) 12/19/2019   HGB 15.0 12/19/2019   HCT 44.1 12/19/2019   MCV 91.5 12/19/2019   PLT 194 12/19/2019   No results found for: FERRITIN, IRON, TIBC, UIBC, IRONPCTSAT Lab Results  Component Value Date   RBC 4.82 12/19/2019   No results found for: KPAFRELGTCHN, LAMBDASER, KAPLAMBRATIO No results found for: IGGSERUM, IGA, IGMSERUM No results found for: Odetta Pink, SPEI   Chemistry      Component Value Date/Time   NA 137 12/19/2019 1134   NA 135 04/29/2017 1157   NA 138 10/29/2016 1021   K 4.0 12/19/2019 1134   K 3.7 04/29/2017 1157   K 4.2 10/29/2016 1021   CL 103 12/19/2019 1134   CL 102 04/29/2017 1157   CO2 25 12/19/2019 1134   CO2 27 04/29/2017 1157   CO2 24 10/29/2016 1021   BUN 20 12/19/2019 1134   BUN 9 04/29/2017 1157   BUN 17.3 10/29/2016 1021   CREATININE 0.97 12/19/2019 1134   CREATININE 0.9 04/29/2017 1157   CREATININE 0.9 10/29/2016 1021      Component Value Date/Time   CALCIUM 10.2  12/19/2019 1134   CALCIUM 9.2 04/29/2017 1157   CALCIUM 10.0 10/29/2016 1021   ALKPHOS 69 12/19/2019 1134   ALKPHOS 64 04/29/2017 1157   ALKPHOS 71 10/29/2016 1021   AST 21 12/19/2019 1134   AST 24 10/29/2016 1021   ALT 26 12/19/2019 1134   ALT 37 04/29/2017 1157   ALT 26 10/29/2016 1021   BILITOT 1.1 12/19/2019 1134   BILITOT 1.18 10/29/2016 1021       Impression and Plan: Ms. Werden is 79 yo caucasian female with history of synchronous bilateral breast cancer, ER positive and HER-2 negative diagnosed back in 2005. Fortunately, both breast cancers were node negative.  She continues to do well and today's exam was unremarkable.  Mammogram will be due again in August.  We  will plan to see her again in another 6 months.  She will contact our office with any questions or concerns. We can certainly see her sooner if needed.   Laverna Peace, NP 3/23/202112:11 PM

## 2019-12-20 DIAGNOSIS — M9902 Segmental and somatic dysfunction of thoracic region: Secondary | ICD-10-CM | POA: Diagnosis not present

## 2019-12-20 DIAGNOSIS — M5033 Other cervical disc degeneration, cervicothoracic region: Secondary | ICD-10-CM | POA: Diagnosis not present

## 2019-12-20 DIAGNOSIS — S46011D Strain of muscle(s) and tendon(s) of the rotator cuff of right shoulder, subsequent encounter: Secondary | ICD-10-CM | POA: Diagnosis not present

## 2019-12-20 DIAGNOSIS — M9901 Segmental and somatic dysfunction of cervical region: Secondary | ICD-10-CM | POA: Diagnosis not present

## 2019-12-20 DIAGNOSIS — R519 Headache, unspecified: Secondary | ICD-10-CM | POA: Diagnosis not present

## 2019-12-25 DIAGNOSIS — M9902 Segmental and somatic dysfunction of thoracic region: Secondary | ICD-10-CM | POA: Diagnosis not present

## 2019-12-25 DIAGNOSIS — M5033 Other cervical disc degeneration, cervicothoracic region: Secondary | ICD-10-CM | POA: Diagnosis not present

## 2019-12-25 DIAGNOSIS — M9901 Segmental and somatic dysfunction of cervical region: Secondary | ICD-10-CM | POA: Diagnosis not present

## 2019-12-25 DIAGNOSIS — R519 Headache, unspecified: Secondary | ICD-10-CM | POA: Diagnosis not present

## 2019-12-27 DIAGNOSIS — M5033 Other cervical disc degeneration, cervicothoracic region: Secondary | ICD-10-CM | POA: Diagnosis not present

## 2019-12-27 DIAGNOSIS — M9902 Segmental and somatic dysfunction of thoracic region: Secondary | ICD-10-CM | POA: Diagnosis not present

## 2019-12-27 DIAGNOSIS — M9901 Segmental and somatic dysfunction of cervical region: Secondary | ICD-10-CM | POA: Diagnosis not present

## 2019-12-27 DIAGNOSIS — R519 Headache, unspecified: Secondary | ICD-10-CM | POA: Diagnosis not present

## 2019-12-28 ENCOUNTER — Other Ambulatory Visit: Payer: Self-pay

## 2019-12-28 MED ORDER — LEVOTHYROXINE SODIUM 88 MCG PO TABS: ORAL_TABLET | ORAL | 0 refills | Status: DC

## 2020-01-01 DIAGNOSIS — M9901 Segmental and somatic dysfunction of cervical region: Secondary | ICD-10-CM | POA: Diagnosis not present

## 2020-01-01 DIAGNOSIS — M5033 Other cervical disc degeneration, cervicothoracic region: Secondary | ICD-10-CM | POA: Diagnosis not present

## 2020-01-01 DIAGNOSIS — M9902 Segmental and somatic dysfunction of thoracic region: Secondary | ICD-10-CM | POA: Diagnosis not present

## 2020-01-01 DIAGNOSIS — R519 Headache, unspecified: Secondary | ICD-10-CM | POA: Diagnosis not present

## 2020-01-04 DIAGNOSIS — M5033 Other cervical disc degeneration, cervicothoracic region: Secondary | ICD-10-CM | POA: Diagnosis not present

## 2020-01-04 DIAGNOSIS — M9901 Segmental and somatic dysfunction of cervical region: Secondary | ICD-10-CM | POA: Diagnosis not present

## 2020-01-04 DIAGNOSIS — M9902 Segmental and somatic dysfunction of thoracic region: Secondary | ICD-10-CM | POA: Diagnosis not present

## 2020-01-04 DIAGNOSIS — R519 Headache, unspecified: Secondary | ICD-10-CM | POA: Diagnosis not present

## 2020-01-05 DIAGNOSIS — S46011D Strain of muscle(s) and tendon(s) of the rotator cuff of right shoulder, subsequent encounter: Secondary | ICD-10-CM | POA: Diagnosis not present

## 2020-01-08 DIAGNOSIS — R519 Headache, unspecified: Secondary | ICD-10-CM | POA: Diagnosis not present

## 2020-01-08 DIAGNOSIS — M9902 Segmental and somatic dysfunction of thoracic region: Secondary | ICD-10-CM | POA: Diagnosis not present

## 2020-01-08 DIAGNOSIS — M9901 Segmental and somatic dysfunction of cervical region: Secondary | ICD-10-CM | POA: Diagnosis not present

## 2020-01-08 DIAGNOSIS — M5033 Other cervical disc degeneration, cervicothoracic region: Secondary | ICD-10-CM | POA: Diagnosis not present

## 2020-01-10 DIAGNOSIS — S46011D Strain of muscle(s) and tendon(s) of the rotator cuff of right shoulder, subsequent encounter: Secondary | ICD-10-CM | POA: Diagnosis not present

## 2020-01-11 DIAGNOSIS — M9901 Segmental and somatic dysfunction of cervical region: Secondary | ICD-10-CM | POA: Diagnosis not present

## 2020-01-11 DIAGNOSIS — R519 Headache, unspecified: Secondary | ICD-10-CM | POA: Diagnosis not present

## 2020-01-11 DIAGNOSIS — M5033 Other cervical disc degeneration, cervicothoracic region: Secondary | ICD-10-CM | POA: Diagnosis not present

## 2020-01-11 DIAGNOSIS — M9902 Segmental and somatic dysfunction of thoracic region: Secondary | ICD-10-CM | POA: Diagnosis not present

## 2020-01-15 DIAGNOSIS — M9901 Segmental and somatic dysfunction of cervical region: Secondary | ICD-10-CM | POA: Diagnosis not present

## 2020-01-15 DIAGNOSIS — M5033 Other cervical disc degeneration, cervicothoracic region: Secondary | ICD-10-CM | POA: Diagnosis not present

## 2020-01-15 DIAGNOSIS — M9902 Segmental and somatic dysfunction of thoracic region: Secondary | ICD-10-CM | POA: Diagnosis not present

## 2020-01-15 DIAGNOSIS — R519 Headache, unspecified: Secondary | ICD-10-CM | POA: Diagnosis not present

## 2020-01-17 DIAGNOSIS — S46011D Strain of muscle(s) and tendon(s) of the rotator cuff of right shoulder, subsequent encounter: Secondary | ICD-10-CM | POA: Diagnosis not present

## 2020-01-18 DIAGNOSIS — M9901 Segmental and somatic dysfunction of cervical region: Secondary | ICD-10-CM | POA: Diagnosis not present

## 2020-01-18 DIAGNOSIS — M5033 Other cervical disc degeneration, cervicothoracic region: Secondary | ICD-10-CM | POA: Diagnosis not present

## 2020-01-18 DIAGNOSIS — R519 Headache, unspecified: Secondary | ICD-10-CM | POA: Diagnosis not present

## 2020-01-18 DIAGNOSIS — M9902 Segmental and somatic dysfunction of thoracic region: Secondary | ICD-10-CM | POA: Diagnosis not present

## 2020-01-22 DIAGNOSIS — M9902 Segmental and somatic dysfunction of thoracic region: Secondary | ICD-10-CM | POA: Diagnosis not present

## 2020-01-22 DIAGNOSIS — R519 Headache, unspecified: Secondary | ICD-10-CM | POA: Diagnosis not present

## 2020-01-22 DIAGNOSIS — M9901 Segmental and somatic dysfunction of cervical region: Secondary | ICD-10-CM | POA: Diagnosis not present

## 2020-01-22 DIAGNOSIS — M5033 Other cervical disc degeneration, cervicothoracic region: Secondary | ICD-10-CM | POA: Diagnosis not present

## 2020-01-24 DIAGNOSIS — S46011D Strain of muscle(s) and tendon(s) of the rotator cuff of right shoulder, subsequent encounter: Secondary | ICD-10-CM | POA: Diagnosis not present

## 2020-01-25 DIAGNOSIS — M5033 Other cervical disc degeneration, cervicothoracic region: Secondary | ICD-10-CM | POA: Diagnosis not present

## 2020-01-25 DIAGNOSIS — M9901 Segmental and somatic dysfunction of cervical region: Secondary | ICD-10-CM | POA: Diagnosis not present

## 2020-01-25 DIAGNOSIS — M9902 Segmental and somatic dysfunction of thoracic region: Secondary | ICD-10-CM | POA: Diagnosis not present

## 2020-01-25 DIAGNOSIS — R519 Headache, unspecified: Secondary | ICD-10-CM | POA: Diagnosis not present

## 2020-01-29 DIAGNOSIS — M9901 Segmental and somatic dysfunction of cervical region: Secondary | ICD-10-CM | POA: Diagnosis not present

## 2020-01-29 DIAGNOSIS — M5033 Other cervical disc degeneration, cervicothoracic region: Secondary | ICD-10-CM | POA: Diagnosis not present

## 2020-01-29 DIAGNOSIS — M9902 Segmental and somatic dysfunction of thoracic region: Secondary | ICD-10-CM | POA: Diagnosis not present

## 2020-01-29 DIAGNOSIS — R519 Headache, unspecified: Secondary | ICD-10-CM | POA: Diagnosis not present

## 2020-01-30 DIAGNOSIS — E119 Type 2 diabetes mellitus without complications: Secondary | ICD-10-CM | POA: Diagnosis not present

## 2020-01-30 LAB — HM DIABETES EYE EXAM

## 2020-01-31 DIAGNOSIS — S46011D Strain of muscle(s) and tendon(s) of the rotator cuff of right shoulder, subsequent encounter: Secondary | ICD-10-CM | POA: Diagnosis not present

## 2020-02-01 DIAGNOSIS — M5033 Other cervical disc degeneration, cervicothoracic region: Secondary | ICD-10-CM | POA: Diagnosis not present

## 2020-02-01 DIAGNOSIS — R519 Headache, unspecified: Secondary | ICD-10-CM | POA: Diagnosis not present

## 2020-02-01 DIAGNOSIS — M9901 Segmental and somatic dysfunction of cervical region: Secondary | ICD-10-CM | POA: Diagnosis not present

## 2020-02-01 DIAGNOSIS — M9902 Segmental and somatic dysfunction of thoracic region: Secondary | ICD-10-CM | POA: Diagnosis not present

## 2020-02-02 DIAGNOSIS — S46011A Strain of muscle(s) and tendon(s) of the rotator cuff of right shoulder, initial encounter: Secondary | ICD-10-CM | POA: Diagnosis not present

## 2020-02-05 DIAGNOSIS — M9901 Segmental and somatic dysfunction of cervical region: Secondary | ICD-10-CM | POA: Diagnosis not present

## 2020-02-05 DIAGNOSIS — M5033 Other cervical disc degeneration, cervicothoracic region: Secondary | ICD-10-CM | POA: Diagnosis not present

## 2020-02-05 DIAGNOSIS — R519 Headache, unspecified: Secondary | ICD-10-CM | POA: Diagnosis not present

## 2020-02-05 DIAGNOSIS — M9902 Segmental and somatic dysfunction of thoracic region: Secondary | ICD-10-CM | POA: Diagnosis not present

## 2020-02-08 DIAGNOSIS — M9902 Segmental and somatic dysfunction of thoracic region: Secondary | ICD-10-CM | POA: Diagnosis not present

## 2020-02-08 DIAGNOSIS — M9901 Segmental and somatic dysfunction of cervical region: Secondary | ICD-10-CM | POA: Diagnosis not present

## 2020-02-08 DIAGNOSIS — M5033 Other cervical disc degeneration, cervicothoracic region: Secondary | ICD-10-CM | POA: Diagnosis not present

## 2020-02-08 DIAGNOSIS — R519 Headache, unspecified: Secondary | ICD-10-CM | POA: Diagnosis not present

## 2020-02-12 DIAGNOSIS — M9901 Segmental and somatic dysfunction of cervical region: Secondary | ICD-10-CM | POA: Diagnosis not present

## 2020-02-12 DIAGNOSIS — M9902 Segmental and somatic dysfunction of thoracic region: Secondary | ICD-10-CM | POA: Diagnosis not present

## 2020-02-12 DIAGNOSIS — R519 Headache, unspecified: Secondary | ICD-10-CM | POA: Diagnosis not present

## 2020-02-12 DIAGNOSIS — M5033 Other cervical disc degeneration, cervicothoracic region: Secondary | ICD-10-CM | POA: Diagnosis not present

## 2020-02-15 DIAGNOSIS — M9901 Segmental and somatic dysfunction of cervical region: Secondary | ICD-10-CM | POA: Diagnosis not present

## 2020-02-15 DIAGNOSIS — R519 Headache, unspecified: Secondary | ICD-10-CM | POA: Diagnosis not present

## 2020-02-15 DIAGNOSIS — M5033 Other cervical disc degeneration, cervicothoracic region: Secondary | ICD-10-CM | POA: Diagnosis not present

## 2020-02-15 DIAGNOSIS — M9902 Segmental and somatic dysfunction of thoracic region: Secondary | ICD-10-CM | POA: Diagnosis not present

## 2020-02-15 DIAGNOSIS — S46011D Strain of muscle(s) and tendon(s) of the rotator cuff of right shoulder, subsequent encounter: Secondary | ICD-10-CM | POA: Diagnosis not present

## 2020-02-19 DIAGNOSIS — S46011D Strain of muscle(s) and tendon(s) of the rotator cuff of right shoulder, subsequent encounter: Secondary | ICD-10-CM | POA: Diagnosis not present

## 2020-02-22 DIAGNOSIS — M5033 Other cervical disc degeneration, cervicothoracic region: Secondary | ICD-10-CM | POA: Diagnosis not present

## 2020-02-22 DIAGNOSIS — M9902 Segmental and somatic dysfunction of thoracic region: Secondary | ICD-10-CM | POA: Diagnosis not present

## 2020-02-22 DIAGNOSIS — R519 Headache, unspecified: Secondary | ICD-10-CM | POA: Diagnosis not present

## 2020-02-22 DIAGNOSIS — M9901 Segmental and somatic dysfunction of cervical region: Secondary | ICD-10-CM | POA: Diagnosis not present

## 2020-02-27 DIAGNOSIS — R519 Headache, unspecified: Secondary | ICD-10-CM | POA: Diagnosis not present

## 2020-02-27 DIAGNOSIS — M9901 Segmental and somatic dysfunction of cervical region: Secondary | ICD-10-CM | POA: Diagnosis not present

## 2020-02-27 DIAGNOSIS — M5033 Other cervical disc degeneration, cervicothoracic region: Secondary | ICD-10-CM | POA: Diagnosis not present

## 2020-02-27 DIAGNOSIS — M9902 Segmental and somatic dysfunction of thoracic region: Secondary | ICD-10-CM | POA: Diagnosis not present

## 2020-02-29 DIAGNOSIS — M5033 Other cervical disc degeneration, cervicothoracic region: Secondary | ICD-10-CM | POA: Diagnosis not present

## 2020-02-29 DIAGNOSIS — M9902 Segmental and somatic dysfunction of thoracic region: Secondary | ICD-10-CM | POA: Diagnosis not present

## 2020-02-29 DIAGNOSIS — M9901 Segmental and somatic dysfunction of cervical region: Secondary | ICD-10-CM | POA: Diagnosis not present

## 2020-02-29 DIAGNOSIS — R519 Headache, unspecified: Secondary | ICD-10-CM | POA: Diagnosis not present

## 2020-03-04 DIAGNOSIS — R519 Headache, unspecified: Secondary | ICD-10-CM | POA: Diagnosis not present

## 2020-03-04 DIAGNOSIS — M9901 Segmental and somatic dysfunction of cervical region: Secondary | ICD-10-CM | POA: Diagnosis not present

## 2020-03-04 DIAGNOSIS — M9902 Segmental and somatic dysfunction of thoracic region: Secondary | ICD-10-CM | POA: Diagnosis not present

## 2020-03-04 DIAGNOSIS — S46011D Strain of muscle(s) and tendon(s) of the rotator cuff of right shoulder, subsequent encounter: Secondary | ICD-10-CM | POA: Diagnosis not present

## 2020-03-04 DIAGNOSIS — M5033 Other cervical disc degeneration, cervicothoracic region: Secondary | ICD-10-CM | POA: Diagnosis not present

## 2020-03-07 DIAGNOSIS — M9901 Segmental and somatic dysfunction of cervical region: Secondary | ICD-10-CM | POA: Diagnosis not present

## 2020-03-07 DIAGNOSIS — M9902 Segmental and somatic dysfunction of thoracic region: Secondary | ICD-10-CM | POA: Diagnosis not present

## 2020-03-07 DIAGNOSIS — R519 Headache, unspecified: Secondary | ICD-10-CM | POA: Diagnosis not present

## 2020-03-07 DIAGNOSIS — M5033 Other cervical disc degeneration, cervicothoracic region: Secondary | ICD-10-CM | POA: Diagnosis not present

## 2020-03-11 DIAGNOSIS — M9901 Segmental and somatic dysfunction of cervical region: Secondary | ICD-10-CM | POA: Diagnosis not present

## 2020-03-11 DIAGNOSIS — M5033 Other cervical disc degeneration, cervicothoracic region: Secondary | ICD-10-CM | POA: Diagnosis not present

## 2020-03-11 DIAGNOSIS — R519 Headache, unspecified: Secondary | ICD-10-CM | POA: Diagnosis not present

## 2020-03-11 DIAGNOSIS — M9902 Segmental and somatic dysfunction of thoracic region: Secondary | ICD-10-CM | POA: Diagnosis not present

## 2020-03-13 DIAGNOSIS — S46011A Strain of muscle(s) and tendon(s) of the rotator cuff of right shoulder, initial encounter: Secondary | ICD-10-CM | POA: Diagnosis not present

## 2020-03-14 DIAGNOSIS — M5033 Other cervical disc degeneration, cervicothoracic region: Secondary | ICD-10-CM | POA: Diagnosis not present

## 2020-03-14 DIAGNOSIS — M9902 Segmental and somatic dysfunction of thoracic region: Secondary | ICD-10-CM | POA: Diagnosis not present

## 2020-03-14 DIAGNOSIS — M9901 Segmental and somatic dysfunction of cervical region: Secondary | ICD-10-CM | POA: Diagnosis not present

## 2020-03-14 DIAGNOSIS — R519 Headache, unspecified: Secondary | ICD-10-CM | POA: Diagnosis not present

## 2020-03-18 DIAGNOSIS — M5033 Other cervical disc degeneration, cervicothoracic region: Secondary | ICD-10-CM | POA: Diagnosis not present

## 2020-03-18 DIAGNOSIS — R519 Headache, unspecified: Secondary | ICD-10-CM | POA: Diagnosis not present

## 2020-03-18 DIAGNOSIS — M9901 Segmental and somatic dysfunction of cervical region: Secondary | ICD-10-CM | POA: Diagnosis not present

## 2020-03-18 DIAGNOSIS — M9902 Segmental and somatic dysfunction of thoracic region: Secondary | ICD-10-CM | POA: Diagnosis not present

## 2020-03-21 DIAGNOSIS — M9902 Segmental and somatic dysfunction of thoracic region: Secondary | ICD-10-CM | POA: Diagnosis not present

## 2020-03-21 DIAGNOSIS — M9901 Segmental and somatic dysfunction of cervical region: Secondary | ICD-10-CM | POA: Diagnosis not present

## 2020-03-21 DIAGNOSIS — R519 Headache, unspecified: Secondary | ICD-10-CM | POA: Diagnosis not present

## 2020-03-21 DIAGNOSIS — M5033 Other cervical disc degeneration, cervicothoracic region: Secondary | ICD-10-CM | POA: Diagnosis not present

## 2020-03-22 ENCOUNTER — Other Ambulatory Visit: Payer: Self-pay | Admitting: Specialist

## 2020-03-22 DIAGNOSIS — S46011A Strain of muscle(s) and tendon(s) of the rotator cuff of right shoulder, initial encounter: Secondary | ICD-10-CM

## 2020-03-26 DIAGNOSIS — M9901 Segmental and somatic dysfunction of cervical region: Secondary | ICD-10-CM | POA: Diagnosis not present

## 2020-03-26 DIAGNOSIS — M9902 Segmental and somatic dysfunction of thoracic region: Secondary | ICD-10-CM | POA: Diagnosis not present

## 2020-03-26 DIAGNOSIS — M5033 Other cervical disc degeneration, cervicothoracic region: Secondary | ICD-10-CM | POA: Diagnosis not present

## 2020-03-26 DIAGNOSIS — R519 Headache, unspecified: Secondary | ICD-10-CM | POA: Diagnosis not present

## 2020-04-02 DIAGNOSIS — M5033 Other cervical disc degeneration, cervicothoracic region: Secondary | ICD-10-CM | POA: Diagnosis not present

## 2020-04-02 DIAGNOSIS — R519 Headache, unspecified: Secondary | ICD-10-CM | POA: Diagnosis not present

## 2020-04-02 DIAGNOSIS — M9902 Segmental and somatic dysfunction of thoracic region: Secondary | ICD-10-CM | POA: Diagnosis not present

## 2020-04-02 DIAGNOSIS — M9901 Segmental and somatic dysfunction of cervical region: Secondary | ICD-10-CM | POA: Diagnosis not present

## 2020-04-02 MED ORDER — LEVOTHYROXINE SODIUM 88 MCG PO TABS: ORAL_TABLET | ORAL | 0 refills | Status: DC

## 2020-04-06 ENCOUNTER — Ambulatory Visit
Admission: RE | Admit: 2020-04-06 | Discharge: 2020-04-06 | Disposition: A | Discharge status: Home / Self Care | Payer: PPO | Source: Ambulatory Visit | Attending: Specialist | Admitting: Specialist

## 2020-04-06 DIAGNOSIS — S46011A Strain of muscle(s) and tendon(s) of the rotator cuff of right shoulder, initial encounter: Secondary | ICD-10-CM | POA: Insufficient documentation

## 2020-04-06 DIAGNOSIS — M75121 Complete rotator cuff tear or rupture of right shoulder, not specified as traumatic: Secondary | ICD-10-CM | POA: Diagnosis not present

## 2020-04-06 DIAGNOSIS — M19011 Primary osteoarthritis, right shoulder: Secondary | ICD-10-CM | POA: Diagnosis not present

## 2020-04-08 ENCOUNTER — Other Ambulatory Visit: Payer: Self-pay | Admitting: Internal Medicine

## 2020-04-08 DIAGNOSIS — Z1231 Encounter for screening mammogram for malignant neoplasm of breast: Secondary | ICD-10-CM

## 2020-04-09 DIAGNOSIS — M9901 Segmental and somatic dysfunction of cervical region: Secondary | ICD-10-CM | POA: Diagnosis not present

## 2020-04-09 DIAGNOSIS — M5033 Other cervical disc degeneration, cervicothoracic region: Secondary | ICD-10-CM | POA: Diagnosis not present

## 2020-04-09 DIAGNOSIS — M9902 Segmental and somatic dysfunction of thoracic region: Secondary | ICD-10-CM | POA: Diagnosis not present

## 2020-04-09 DIAGNOSIS — R519 Headache, unspecified: Secondary | ICD-10-CM | POA: Diagnosis not present

## 2020-04-12 ENCOUNTER — Telehealth: Payer: Self-pay | Admitting: *Deleted

## 2020-04-12 DIAGNOSIS — E782 Mixed hyperlipidemia: Secondary | ICD-10-CM

## 2020-04-12 DIAGNOSIS — M9902 Segmental and somatic dysfunction of thoracic region: Secondary | ICD-10-CM | POA: Diagnosis not present

## 2020-04-12 DIAGNOSIS — R519 Headache, unspecified: Secondary | ICD-10-CM | POA: Diagnosis not present

## 2020-04-12 DIAGNOSIS — E039 Hypothyroidism, unspecified: Secondary | ICD-10-CM

## 2020-04-12 DIAGNOSIS — E119 Type 2 diabetes mellitus without complications: Secondary | ICD-10-CM

## 2020-04-12 DIAGNOSIS — M5033 Other cervical disc degeneration, cervicothoracic region: Secondary | ICD-10-CM | POA: Diagnosis not present

## 2020-04-12 DIAGNOSIS — M9901 Segmental and somatic dysfunction of cervical region: Secondary | ICD-10-CM | POA: Diagnosis not present

## 2020-04-12 NOTE — Telephone Encounter (Signed)
Please place future orders for lab appt.  

## 2020-04-12 NOTE — Telephone Encounter (Signed)
Labs ordered.  MUST HAVE THE URINE TEST!  SHE IS OVERDUE

## 2020-04-15 ENCOUNTER — Other Ambulatory Visit (INDEPENDENT_AMBULATORY_CARE_PROVIDER_SITE_OTHER): Payer: PPO

## 2020-04-15 ENCOUNTER — Other Ambulatory Visit: Payer: Self-pay

## 2020-04-15 DIAGNOSIS — E119 Type 2 diabetes mellitus without complications: Secondary | ICD-10-CM

## 2020-04-15 DIAGNOSIS — E782 Mixed hyperlipidemia: Secondary | ICD-10-CM | POA: Diagnosis not present

## 2020-04-15 DIAGNOSIS — E039 Hypothyroidism, unspecified: Secondary | ICD-10-CM | POA: Diagnosis not present

## 2020-04-15 DIAGNOSIS — S46011D Strain of muscle(s) and tendon(s) of the rotator cuff of right shoulder, subsequent encounter: Secondary | ICD-10-CM | POA: Diagnosis not present

## 2020-04-15 LAB — COMPREHENSIVE METABOLIC PANEL
ALT: 38 U/L — ABNORMAL HIGH (ref 0–35)
AST: 40 U/L — ABNORMAL HIGH (ref 0–37)
Albumin: 4.5 g/dL (ref 3.5–5.2)
Alkaline Phosphatase: 59 U/L (ref 39–117)
BUN: 16 mg/dL (ref 6–23)
CO2: 23 mEq/L (ref 19–32)
Calcium: 9.7 mg/dL (ref 8.4–10.5)
Chloride: 105 mEq/L (ref 96–112)
Creatinine, Ser: 0.98 mg/dL (ref 0.40–1.20)
GFR: 54.85 mL/min — ABNORMAL LOW (ref >60.00)
Glucose, Bld: 154 mg/dL — ABNORMAL HIGH (ref 70–99)
Potassium: 4.6 mEq/L (ref 3.5–5.1)
Sodium: 138 mEq/L (ref 135–145)
Total Bilirubin: 0.8 mg/dL (ref 0.2–1.2)
Total Protein: 6.8 g/dL (ref 6.0–8.3)

## 2020-04-15 LAB — MICROALBUMIN / CREATININE URINE RATIO
Creatinine,U: 111.8 mg/dL
Microalb Creat Ratio: 0.6 mg/g (ref 0.0–30.0)
Microalb, Ur: <0.7 mg/dL (ref 0.0–1.9)

## 2020-04-15 LAB — LIPID PANEL
Cholesterol: 211 mg/dL — ABNORMAL HIGH (ref 0–200)
HDL: 79.1 mg/dL (ref >39.00)
LDL Cholesterol: 113 mg/dL — ABNORMAL HIGH (ref 0–99)
NonHDL: 131.6
Total CHOL/HDL Ratio: 3
Triglycerides: 95 mg/dL (ref 0.0–149.0)
VLDL: 19 mg/dL (ref 0.0–40.0)

## 2020-04-15 LAB — HEMOGLOBIN A1C: Hgb A1c MFr Bld: 7.2 % — ABNORMAL HIGH (ref 4.6–6.5)

## 2020-04-15 LAB — TSH: TSH: 0.7 u[IU]/mL (ref 0.35–4.50)

## 2020-04-16 ENCOUNTER — Other Ambulatory Visit: Payer: Self-pay | Admitting: Internal Medicine

## 2020-04-16 DIAGNOSIS — M9902 Segmental and somatic dysfunction of thoracic region: Secondary | ICD-10-CM | POA: Diagnosis not present

## 2020-04-16 DIAGNOSIS — K7581 Nonalcoholic steatohepatitis (NASH): Secondary | ICD-10-CM | POA: Insufficient documentation

## 2020-04-16 DIAGNOSIS — R748 Abnormal levels of other serum enzymes: Secondary | ICD-10-CM

## 2020-04-16 DIAGNOSIS — R519 Headache, unspecified: Secondary | ICD-10-CM | POA: Diagnosis not present

## 2020-04-16 DIAGNOSIS — M5033 Other cervical disc degeneration, cervicothoracic region: Secondary | ICD-10-CM | POA: Diagnosis not present

## 2020-04-16 DIAGNOSIS — M9901 Segmental and somatic dysfunction of cervical region: Secondary | ICD-10-CM | POA: Diagnosis not present

## 2020-04-19 DIAGNOSIS — M9901 Segmental and somatic dysfunction of cervical region: Secondary | ICD-10-CM | POA: Diagnosis not present

## 2020-04-19 DIAGNOSIS — M9902 Segmental and somatic dysfunction of thoracic region: Secondary | ICD-10-CM | POA: Diagnosis not present

## 2020-04-19 DIAGNOSIS — R519 Headache, unspecified: Secondary | ICD-10-CM | POA: Diagnosis not present

## 2020-04-19 DIAGNOSIS — M5033 Other cervical disc degeneration, cervicothoracic region: Secondary | ICD-10-CM | POA: Diagnosis not present

## 2020-04-23 ENCOUNTER — Other Ambulatory Visit: Payer: Self-pay

## 2020-04-23 DIAGNOSIS — M5033 Other cervical disc degeneration, cervicothoracic region: Secondary | ICD-10-CM | POA: Diagnosis not present

## 2020-04-23 DIAGNOSIS — R519 Headache, unspecified: Secondary | ICD-10-CM | POA: Diagnosis not present

## 2020-04-23 DIAGNOSIS — M9901 Segmental and somatic dysfunction of cervical region: Secondary | ICD-10-CM | POA: Diagnosis not present

## 2020-04-23 DIAGNOSIS — M9902 Segmental and somatic dysfunction of thoracic region: Secondary | ICD-10-CM | POA: Diagnosis not present

## 2020-04-30 ENCOUNTER — Other Ambulatory Visit (INDEPENDENT_AMBULATORY_CARE_PROVIDER_SITE_OTHER): Payer: PPO

## 2020-04-30 ENCOUNTER — Other Ambulatory Visit: Payer: Self-pay

## 2020-04-30 DIAGNOSIS — R748 Abnormal levels of other serum enzymes: Secondary | ICD-10-CM | POA: Diagnosis not present

## 2020-04-30 LAB — HEPATIC FUNCTION PANEL
ALT: 25 U/L (ref 0–35)
AST: 21 U/L (ref 0–37)
Albumin: 4.2 g/dL (ref 3.5–5.2)
Alkaline Phosphatase: 53 U/L (ref 39–117)
Bilirubin, Direct: 0.1 mg/dL (ref 0.0–0.3)
Total Bilirubin: 1 mg/dL (ref 0.2–1.2)
Total Protein: 6.5 g/dL (ref 6.0–8.3)

## 2020-05-06 DIAGNOSIS — M9901 Segmental and somatic dysfunction of cervical region: Secondary | ICD-10-CM | POA: Diagnosis not present

## 2020-05-06 DIAGNOSIS — M9902 Segmental and somatic dysfunction of thoracic region: Secondary | ICD-10-CM | POA: Diagnosis not present

## 2020-05-06 DIAGNOSIS — R519 Headache, unspecified: Secondary | ICD-10-CM | POA: Diagnosis not present

## 2020-05-06 DIAGNOSIS — M5033 Other cervical disc degeneration, cervicothoracic region: Secondary | ICD-10-CM | POA: Diagnosis not present

## 2020-05-08 ENCOUNTER — Ambulatory Visit
Admission: RE | Admit: 2020-05-08 | Discharge: 2020-05-08 | Disposition: A | Discharge status: Home / Self Care | Payer: PPO | Source: Ambulatory Visit | Attending: Internal Medicine | Admitting: Internal Medicine

## 2020-05-08 ENCOUNTER — Other Ambulatory Visit: Payer: Self-pay

## 2020-05-08 DIAGNOSIS — Z1231 Encounter for screening mammogram for malignant neoplasm of breast: Secondary | ICD-10-CM | POA: Diagnosis not present

## 2020-05-08 HISTORY — DX: Personal history of antineoplastic chemotherapy: Z92.21

## 2020-05-08 HISTORY — DX: Personal history of irradiation: Z92.3

## 2020-05-13 DIAGNOSIS — R519 Headache, unspecified: Secondary | ICD-10-CM | POA: Diagnosis not present

## 2020-05-13 DIAGNOSIS — M9901 Segmental and somatic dysfunction of cervical region: Secondary | ICD-10-CM | POA: Diagnosis not present

## 2020-05-13 DIAGNOSIS — M75121 Complete rotator cuff tear or rupture of right shoulder, not specified as traumatic: Secondary | ICD-10-CM | POA: Diagnosis not present

## 2020-05-13 DIAGNOSIS — M9902 Segmental and somatic dysfunction of thoracic region: Secondary | ICD-10-CM | POA: Diagnosis not present

## 2020-05-13 DIAGNOSIS — M5033 Other cervical disc degeneration, cervicothoracic region: Secondary | ICD-10-CM | POA: Diagnosis not present

## 2020-05-15 DIAGNOSIS — L718 Other rosacea: Secondary | ICD-10-CM | POA: Diagnosis not present

## 2020-05-15 DIAGNOSIS — L858 Other specified epidermal thickening: Secondary | ICD-10-CM | POA: Diagnosis not present

## 2020-05-15 DIAGNOSIS — Z85828 Personal history of other malignant neoplasm of skin: Secondary | ICD-10-CM | POA: Diagnosis not present

## 2020-05-15 DIAGNOSIS — Z08 Encounter for follow-up examination after completed treatment for malignant neoplasm: Secondary | ICD-10-CM | POA: Diagnosis not present

## 2020-05-15 DIAGNOSIS — L821 Other seborrheic keratosis: Secondary | ICD-10-CM | POA: Diagnosis not present

## 2020-05-15 DIAGNOSIS — M71371 Other bursal cyst, right ankle and foot: Secondary | ICD-10-CM | POA: Diagnosis not present

## 2020-05-27 DIAGNOSIS — R519 Headache, unspecified: Secondary | ICD-10-CM | POA: Diagnosis not present

## 2020-05-27 DIAGNOSIS — M9901 Segmental and somatic dysfunction of cervical region: Secondary | ICD-10-CM | POA: Diagnosis not present

## 2020-05-27 DIAGNOSIS — M9902 Segmental and somatic dysfunction of thoracic region: Secondary | ICD-10-CM | POA: Diagnosis not present

## 2020-05-27 DIAGNOSIS — M5033 Other cervical disc degeneration, cervicothoracic region: Secondary | ICD-10-CM | POA: Diagnosis not present

## 2020-06-10 DIAGNOSIS — M9901 Segmental and somatic dysfunction of cervical region: Secondary | ICD-10-CM | POA: Diagnosis not present

## 2020-06-10 DIAGNOSIS — M9902 Segmental and somatic dysfunction of thoracic region: Secondary | ICD-10-CM | POA: Diagnosis not present

## 2020-06-10 DIAGNOSIS — M5033 Other cervical disc degeneration, cervicothoracic region: Secondary | ICD-10-CM | POA: Diagnosis not present

## 2020-06-10 DIAGNOSIS — R519 Headache, unspecified: Secondary | ICD-10-CM | POA: Diagnosis not present

## 2020-06-20 ENCOUNTER — Other Ambulatory Visit: Payer: Self-pay

## 2020-06-20 ENCOUNTER — Encounter: Payer: Self-pay | Admitting: Hematology & Oncology

## 2020-06-20 ENCOUNTER — Inpatient Hospital Stay (HOSPITAL_BASED_OUTPATIENT_CLINIC_OR_DEPARTMENT_OTHER): Payer: PPO | Admitting: Hematology & Oncology

## 2020-06-20 ENCOUNTER — Inpatient Hospital Stay: Payer: PPO | Attending: Hematology & Oncology

## 2020-06-20 VITALS — BP 92/63 | HR 85 | Temp 98.1°F | Resp 20 | Wt 178.8 lb

## 2020-06-20 DIAGNOSIS — Z9181 History of falling: Secondary | ICD-10-CM | POA: Diagnosis not present

## 2020-06-20 DIAGNOSIS — Z170 Estrogen receptor positive status [ER+]: Secondary | ICD-10-CM | POA: Diagnosis not present

## 2020-06-20 DIAGNOSIS — D0510 Intraductal carcinoma in situ of unspecified breast: Secondary | ICD-10-CM

## 2020-06-20 DIAGNOSIS — C50919 Malignant neoplasm of unspecified site of unspecified female breast: Secondary | ICD-10-CM | POA: Diagnosis not present

## 2020-06-20 DIAGNOSIS — Z08 Encounter for follow-up examination after completed treatment for malignant neoplasm: Secondary | ICD-10-CM | POA: Insufficient documentation

## 2020-06-20 DIAGNOSIS — Z853 Personal history of malignant neoplasm of breast: Secondary | ICD-10-CM | POA: Insufficient documentation

## 2020-06-20 LAB — CBC WITH DIFFERENTIAL (CANCER CENTER ONLY)
Abs Immature Granulocytes: 0.02 10*3/uL (ref 0.00–0.07)
Basophils Absolute: 0.1 10*3/uL (ref 0.0–0.1)
Basophils Relative: 1 %
Eosinophils Absolute: 0.4 10*3/uL (ref 0.0–0.5)
Eosinophils Relative: 5 %
HCT: 41.9 % (ref 36.0–46.0)
Hemoglobin: 14.2 g/dL (ref 12.0–15.0)
Immature Granulocytes: 0 %
Lymphocytes Relative: 30 %
Lymphs Abs: 2.3 10*3/uL (ref 0.7–4.0)
MCH: 31.4 pg (ref 26.0–34.0)
MCHC: 33.9 g/dL (ref 30.0–36.0)
MCV: 92.7 fL (ref 80.0–100.0)
Monocytes Absolute: 0.5 10*3/uL (ref 0.1–1.0)
Monocytes Relative: 7 %
Neutro Abs: 4.3 10*3/uL (ref 1.7–7.7)
Neutrophils Relative %: 57 %
Platelet Count: 169 10*3/uL (ref 150–400)
RBC: 4.52 MIL/uL (ref 3.87–5.11)
RDW: 12.2 % (ref 11.5–15.5)
WBC Count: 7.6 10*3/uL (ref 4.0–10.5)
nRBC: 0 % (ref 0.0–0.2)

## 2020-06-20 LAB — CMP (CANCER CENTER ONLY)
ALT: 19 U/L (ref 0–44)
AST: 17 U/L (ref 15–41)
Albumin: 4.3 g/dL (ref 3.5–5.0)
Alkaline Phosphatase: 57 U/L (ref 38–126)
Anion gap: 9 (ref 5–15)
BUN: 18 mg/dL (ref 8–23)
CO2: 25 mmol/L (ref 22–32)
Calcium: 10.2 mg/dL (ref 8.9–10.3)
Chloride: 103 mmol/L (ref 98–111)
Creatinine: 1.2 mg/dL — ABNORMAL HIGH (ref 0.44–1.00)
GFR, Est AFR Am: 50 mL/min — ABNORMAL LOW (ref >60)
GFR, Estimated: 43 mL/min — ABNORMAL LOW (ref >60)
Glucose, Bld: 147 mg/dL — ABNORMAL HIGH (ref 70–99)
Potassium: 4.3 mmol/L (ref 3.5–5.1)
Sodium: 137 mmol/L (ref 135–145)
Total Bilirubin: 0.8 mg/dL (ref 0.3–1.2)
Total Protein: 7 g/dL (ref 6.5–8.1)

## 2020-06-20 NOTE — Progress Notes (Signed)
Hematology and Oncology Follow Up Visit  Jacqueline Mata [id number] 21-Aug-1941 79 y.o. 06/20/2020   Principle Diagnosis:  Synchronous bilateral stage IIA (T2 N0 M0) ductal carcinoma of bilateral breast - ER+/HER2-.  Current Therapy:    Observation     Interim History:  Ms.  Mata is comes in for followup.  She is doing quite well.  We last saw her about 6 months ago.  Everything is going well.  She really has no complaints.  She is trying to watch her blood sugars.  She is exercising a little bit more.  She did fall.  This was back in December.  She hurt her right shoulder.  She tore couple tendons.  She is trying to hold off on surgery for this.    She has gained a little bit of weight.  She is try to be careful with this.  There has been no issues with nausea or vomiting.  She has had no problems with bowels or bladder  Last mammogram was done on 05/08/2020.  Everything looked fine.  Overall, her performance status is ECOG 0.    Medications:  Current Outpatient Medications:  .  amoxicillin (AMOXIL) 500 MG capsule, 500 mg. Dental procedures. Takes 2020m one hours prior to dental procedures., Disp: , Rfl:  .  Blood Glucose Monitoring Suppl KIT, by Does not apply route., Disp: , Rfl:  .  Cholecalciferol (VITAMIN D3) 1000 UNITS CAPS, Take 2 capsules by mouth daily. , Disp: , Rfl:  .  folic acid (FOLVITE) 8277MCG tablet, Take 400 mcg by mouth daily., Disp: , Rfl:  .  glucose blood (ACCU-CHEK AVIVA PLUS) test strip, Use to check blood sugar once daily., Disp: 100 each, Rfl: 5 .  Lancets (ACCU-CHEK MULTICLIX) lancets, Use once daily to check blood sugars   Dx:E11.9, Disp: 100 each, Rfl: 3 .  levothyroxine (SYNTHROID) 88 MCG tablet, TAKE 1 TABLET(75 MCG) BY MOUTH DAILY BEFORE BREAKFAST, Disp: 90 tablet, Rfl: 0 .  meloxicam (MOBIC) 15 MG tablet, daily. , Disp: , Rfl:  .  metroNIDAZOLE (METROGEL) 1 % gel, APP TOPICALLY TO FACE NIGHTLY, Disp: , Rfl:  .  PREBIOTIC PRODUCT PO, Take 1  scoop by mouth daily., Disp: , Rfl:  .  cetirizine (ZYRTEC) 10 MG tablet, Take 10 mg by mouth as needed. , Disp: , Rfl:  .  fluocinonide cream (LIDEX) 08.24%, Apply 1 application topically daily as needed. Reported on 12/09/2015 (Patient not taking: Reported on 06/20/2020), Disp: , Rfl:  .  magnesium oxide (MAG-OX) 400 MG tablet, Take 400 mg by mouth daily., Disp: , Rfl:   Allergies:  Allergies  Allergen Reactions  . Codeine Shortness Of Breath and Other (See Comments)    Altered mental staus  . Ilevro [Nepafenac] Other (See Comments)    Eye swelling   . Lamisil [Terbinafine] Rash and Other (See Comments)    Dysphagia and skin fungus  . Mold Extract [Trichophyton Mentagrophyte] Other (See Comments)  . Other Swelling    ILLEVRO (EYE DROPS)- EYE SWELLING  DOGS.Congestion. FEATHERS.Congestion  . Molds & Smuts Other (See Comments)    Congestion.  .Marland KitchenNeomycin Rash  . Neomycin-Bacitracin Zn-Polymyx Rash  . Neomycin-Polymyxin-Hc Rash  . Tape Dermatitis    Blisters  . Tapentadol Nausea And Vomiting and Other (See Comments)  . Terbinafine Rash    Trouble swallowing  . Tramadol Other (See Comments), Nausea Only and Nausea And Vomiting    dizziness    Past Medical History, Surgical history, Social  history, and Family History were reviewed and updated.  Review of Systems: Review of Systems  Constitutional: Negative.   HENT: Negative.   Eyes: Negative.   Respiratory: Negative.   Cardiovascular: Negative.   Gastrointestinal: Negative.   Genitourinary: Negative.   Musculoskeletal: Negative.   Skin: Negative.   Neurological: Negative.   Endo/Heme/Allergies: Negative.   Psychiatric/Behavioral: Negative.      Physical Exam:  weight is 178 lb 12.8 oz (81.1 kg). Her oral temperature is 98.1 F (36.7 C). Her blood pressure is 92/63 and her pulse is 85. Her respiration is 20 and oxygen saturation is 98%.   Physical Exam Vitals reviewed.  HENT:     Head: Normocephalic and  atraumatic.  Eyes:     Pupils: Pupils are equal, round, and reactive to light.  Cardiovascular:     Rate and Rhythm: Normal rate and regular rhythm.     Heart sounds: Normal heart sounds.  Pulmonary:     Effort: Pulmonary effort is normal.     Breath sounds: Normal breath sounds.  Abdominal:     General: Bowel sounds are normal.     Palpations: Abdomen is soft.  Musculoskeletal:        General: No tenderness or deformity. Normal range of motion.     Cervical back: Normal range of motion.  Lymphadenopathy:     Cervical: No cervical adenopathy.  Skin:    General: Skin is warm and dry.     Findings: No erythema or rash.  Neurological:     Mental Status: She is alert and oriented to person, place, and time.  Psychiatric:        Behavior: Behavior normal.        Thought Content: Thought content normal.        Judgment: Judgment normal.    Lab Results  Component Value Date   WBC 7.6 06/20/2020   HGB 14.2 06/20/2020   HCT 41.9 06/20/2020   MCV 92.7 06/20/2020   PLT 169 06/20/2020     Chemistry      Component Value Date/Time   NA 137 06/20/2020 1129   NA 135 04/29/2017 1157   NA 138 10/29/2016 1021   K 4.3 06/20/2020 1129   K 3.7 04/29/2017 1157   K 4.2 10/29/2016 1021   CL 103 06/20/2020 1129   CL 102 04/29/2017 1157   CO2 25 06/20/2020 1129   CO2 27 04/29/2017 1157   CO2 24 10/29/2016 1021   BUN 18 06/20/2020 1129   BUN 9 04/29/2017 1157   BUN 17.3 10/29/2016 1021   CREATININE 1.20 (H) 06/20/2020 1129   CREATININE 0.9 04/29/2017 1157   CREATININE 0.9 10/29/2016 1021      Component Value Date/Time   CALCIUM 10.2 06/20/2020 1129   CALCIUM 9.2 04/29/2017 1157   CALCIUM 10.0 10/29/2016 1021   ALKPHOS 57 06/20/2020 1129   ALKPHOS 64 04/29/2017 1157   ALKPHOS 71 10/29/2016 1021   AST 17 06/20/2020 1129   AST 24 10/29/2016 1021   ALT 19 06/20/2020 1129   ALT 37 04/29/2017 1157   ALT 26 10/29/2016 1021   BILITOT 0.8 06/20/2020 1129   BILITOT 1.18 10/29/2016  1021       Impression and Plan: Jacqueline Mata is 79 year old white female with history of synchronous bilateral breast cancer. Fortunately both breast cancers were node negative. They were ER positive and HER-2 negative.   It has now been about 17 years since she was treated for the breast cancer.  I really have to believe that she is going to be cured since her cancers were node negative.  It is becoming more difficult for her to make it to our office.  She lives in [place].  We may have to make a transfer to the [facility].  This may be a lot safer for her.  I would hate to lose her as a patient but I know that she will be in good hands there and will be a lot safer for her since this is a lot closer to where she lives.  As always, we had very good fellowship.   Volanda Napoleon, MD 9/23/202112:12 PM

## 2020-06-24 DIAGNOSIS — R519 Headache, unspecified: Secondary | ICD-10-CM | POA: Diagnosis not present

## 2020-06-24 DIAGNOSIS — M9901 Segmental and somatic dysfunction of cervical region: Secondary | ICD-10-CM | POA: Diagnosis not present

## 2020-06-24 DIAGNOSIS — M5033 Other cervical disc degeneration, cervicothoracic region: Secondary | ICD-10-CM | POA: Diagnosis not present

## 2020-06-24 DIAGNOSIS — M9902 Segmental and somatic dysfunction of thoracic region: Secondary | ICD-10-CM | POA: Diagnosis not present

## 2020-06-26 ENCOUNTER — Other Ambulatory Visit: Payer: Self-pay | Admitting: Internal Medicine

## 2020-07-02 ENCOUNTER — Ambulatory Visit: Payer: PPO

## 2020-07-08 DIAGNOSIS — M9902 Segmental and somatic dysfunction of thoracic region: Secondary | ICD-10-CM | POA: Diagnosis not present

## 2020-07-08 DIAGNOSIS — M9901 Segmental and somatic dysfunction of cervical region: Secondary | ICD-10-CM | POA: Diagnosis not present

## 2020-07-08 DIAGNOSIS — R519 Headache, unspecified: Secondary | ICD-10-CM | POA: Diagnosis not present

## 2020-07-08 DIAGNOSIS — M5033 Other cervical disc degeneration, cervicothoracic region: Secondary | ICD-10-CM | POA: Diagnosis not present

## 2020-07-09 ENCOUNTER — Ambulatory Visit (INDEPENDENT_AMBULATORY_CARE_PROVIDER_SITE_OTHER): Payer: PPO | Admitting: Internal Medicine

## 2020-07-09 ENCOUNTER — Other Ambulatory Visit: Payer: Self-pay

## 2020-07-09 ENCOUNTER — Encounter: Payer: Self-pay | Admitting: Internal Medicine

## 2020-07-09 VITALS — BP 136/88 | HR 105 | Temp 98.1°F | Resp 14 | Ht 64.0 in | Wt 180.6 lb

## 2020-07-09 DIAGNOSIS — E119 Type 2 diabetes mellitus without complications: Secondary | ICD-10-CM

## 2020-07-09 DIAGNOSIS — R944 Abnormal results of kidney function studies: Secondary | ICD-10-CM | POA: Diagnosis not present

## 2020-07-09 DIAGNOSIS — R748 Abnormal levels of other serum enzymes: Secondary | ICD-10-CM | POA: Diagnosis not present

## 2020-07-09 DIAGNOSIS — E039 Hypothyroidism, unspecified: Secondary | ICD-10-CM | POA: Diagnosis not present

## 2020-07-09 DIAGNOSIS — L84 Corns and callosities: Secondary | ICD-10-CM

## 2020-07-09 NOTE — Progress Notes (Signed)
Subjective:  Patient ID: Jacqueline Mata, female    DOB: March 08, 1941  Age: 79 y.o. MRN: [id number]  CC: The primary encounter diagnosis was Decreased GFR. Diagnoses of Diabetes mellitus type 2, diet-controlled ([place]), Acquired hypothyroidism, Pre-ulcerative corn or callous, and Elevated liver enzymes were also pertinent to this visit.  HPI Jacqueline Mata presents for follow up on type 2 DM   This visit occurred during the SARS-CoV-2 public health emergency.  Safety protocols were in place, including screening questions prior to the visit, additional usage of staff PPE, and extensive cleaning of exam room while observing appropriate contact time as indicated for disinfecting solutions.   Repeat liver enzymes normalized   Cr was elevated at 1.2 at cancer center last month .  Has been using meloxicam for joint pain involving the shoulder and thigh.    She has developed Medial thigh pain in the right lg that has been discouraging her from exercising.  The pain has radiated proximally and has now spread to the groin . Discussed referral to [facility] inguinal region .    Balance getting worse,  Having intermittent Vertigo. Previous episode occurred with use of tramadol and nucynta for knee related pain.  But ENT  Has diagnosed a vestibular disorder.    She has developed unsteadiness standing in the bathtub and has decided to replace it with a walk in shower, which I agree is medically necessary to prevent falls.  Has a $17K implant needed for dental issues   Saw [facility] last month  Labs were fine      Outpatient Medications Prior to Visit  Medication Sig Dispense Refill  . amoxicillin (AMOXIL) 500 MG capsule 500 mg. Dental procedures. Takes $RemoveBeforeDEI'2000mg'[id number]$  one hours prior to dental procedures.    . Blood Glucose Monitoring Suppl KIT by Does not apply route.    . Cholecalciferol (VITAMIN D3) 1000 UNITS CAPS Take 2 capsules by mouth daily.     . folic acid (FOLVITE) 416 MCG tablet Take 400 mcg by  mouth daily.    Marland Kitchen glucose blood (ACCU-CHEK AVIVA PLUS) test strip Use to check blood sugar once daily. 100 each 5  . Lancets (ACCU-CHEK MULTICLIX) lancets Use once daily to check blood sugars   Dx:E11.9 100 each 3  . levothyroxine (SYNTHROID) 88 MCG tablet TAKE 1 TABLET BY MOUTH DAILY BEFORE BREAKFAST 90 tablet 0  . loratadine (CLARITIN) 10 MG tablet Take 10 mg by mouth daily.    . meloxicam (MOBIC) 15 MG tablet daily.     . metroNIDAZOLE (METROGEL) 1 % gel APP TOPICALLY TO FACE NIGHTLY    . PREBIOTIC PRODUCT PO Take 1 scoop by mouth daily.    . fluocinonide cream (LIDEX) 3.84 % Apply 1 application topically daily as needed. Reported on 12/09/2015 (Patient not taking: Reported on 06/20/2020)    . cetirizine (ZYRTEC) 10 MG tablet Take 10 mg by mouth as needed.     . magnesium oxide (MAG-OX) 400 MG tablet Take 400 mg by mouth daily.     No facility-administered medications prior to visit.    Review of Systems;  Patient denies headache, fevers, malaise, unintentional weight loss, skin rash, eye pain, sinus congestion and sinus pain, sore throat, dysphagia,  hemoptysis , cough, dyspnea, wheezing, chest pain, palpitations, orthopnea, edema, abdominal pain, nausea, melena, diarrhea, constipation, flank pain, dysuria, hematuria, urinary  Frequency, nocturia, numbness, tingling, seizures,  Focal weakness, Loss of consciousness,  Tremor, insomnia, depression, anxiety, and suicidal ideation.  Objective:  BP 136/88 (BP Location: Left Arm, Patient Position: Sitting, Cuff Size: Normal)   Pulse (!) 105   Temp 98.1 F (36.7 C) (Oral)   Resp 14   Ht $R'5\' 4"'QJ$  (1.626 m)   Wt 180 lb 9.6 oz (81.9 kg)   SpO2 96%   BMI 31.00 kg/m   BP Readings from Last 3 Encounters:  07/09/20 136/88  06/20/20 92/63  12/19/19 (!) 132/91    Wt Readings from Last 3 Encounters:  07/09/20 180 lb 9.6 oz (81.9 kg)  06/20/20 178 lb 12.8 oz (81.1 kg)  12/19/19 179 lb 6.4 oz (81.4 kg)    General appearance: alert,  cooperative and appears stated age Ears: normal TM's and external ear canals both ears Throat: lips, mucosa, and tongue normal; teeth and gums normal Neck: no adenopathy, no carotid bruit, supple, symmetrical, trachea midline and thyroid not enlarged, symmetric, no tenderness/mass/nodules Back: symmetric, no curvature. ROM normal. No CVA tenderness. Lungs: clear to auscultation bilaterally Heart: regular rate and rhythm, S1, S2 normal, no murmur, click, rub or gallop Abdomen: soft, non-tender; bowel sounds normal; no masses,  no organomegaly Pulses: 2+ and symmetric Skin: Skin color, texture, turgor normal. No rashes or lesions Lymph nodes: Cervical, supraclavicular, and axillary nodes normal.  Lab Results  Component Value Date   HGBA1C 7.2 (H) 04/15/2020   HGBA1C 6.7 (H) 10/11/2019   HGBA1C 6.9 (H) 02/07/2019    Lab Results  Component Value Date   CREATININE 1.20 (H) 06/20/2020   CREATININE 0.98 04/15/2020   CREATININE 0.97 12/19/2019    Lab Results  Component Value Date   WBC 7.6 06/20/2020   HGB 14.2 06/20/2020   HCT 41.9 06/20/2020   PLT 169 06/20/2020   GLUCOSE 147 (H) 06/20/2020   CHOL 211 (H) 04/15/2020   TRIG 95.0 04/15/2020   HDL 79.10 04/15/2020   LDLDIRECT 122.0 07/01/2017   LDLCALC 113 (H) 04/15/2020   ALT 19 06/20/2020   AST 17 06/20/2020   NA 137 06/20/2020   K 4.3 06/20/2020   CL 103 06/20/2020   CREATININE 1.20 (H) 06/20/2020   BUN 18 06/20/2020   CO2 25 06/20/2020   TSH 0.70 04/15/2020   INR 1.0 03/14/2012   HGBA1C 7.2 (H) 04/15/2020   MICROALBUR <0.7 04/15/2020    MM 3D SCREEN BREAST BILATERAL  Result Date: 05/08/2020 CLINICAL DATA:  Screening. EXAM: DIGITAL SCREENING BILATERAL MAMMOGRAM WITH TOMO AND CAD COMPARISON:  Previous exam(s). ACR Breast Density Category b: There are scattered areas of fibroglandular density. FINDINGS: There are no findings suspicious for malignancy. Images were processed with CAD. IMPRESSION: No mammographic evidence  of malignancy. A result letter of this screening mammogram will be mailed directly to the patient. RECOMMENDATION: Screening mammogram in one year. (Code:SM-B-01Y) BI-RADS CATEGORY  1: Negative. Electronically Signed   By: Ammie Ferrier M.D.   On: 05/08/2020 12:53    Assessment & Plan:   Problem List Items Addressed This Visit      Unprioritized   Acquired hypothyroidism    Thyroid function was  WNL in July on current dose.   Lab Results  Component Value Date   TSH 0.70 04/15/2020         Relevant Orders   TSH   Diabetes mellitus type 2, diet-controlled ([place])    Historically has had excellent control with diet alone.  Discussed starting metformin for weight loss and control if a1c is > 7.0 .  Patient is up-to-date on eye exams and foot exam was abnormal  on her right 3rd toe. Patient has no history of  microalbuminuria . Patient is intolerant of statin therapy for CAD risk reduction. She is taking a baby asa for primary prevention.   Lab Results  Component Value Date   HGBA1C 7.2 (H) 04/15/2020   Lab Results  Component Value Date   MICROALBUR <0.7 04/15/2020           Relevant Orders   Hemoglobin A1c   Comprehensive metabolic panel   Lipid panel   Elevated liver enzymes    Repeat enzymes are normal      Pre-ulcerative corn or callous    Right foot, third toe.  Has been managed by her pedicurist.  Given her diabetes, I am recommending  A podiatry referral.       Relevant Orders   Ambulatory referral to Podiatry    Other Visit Diagnoses    Decreased GFR    -  Primary   Relevant Orders   Basic metabolic panel    A total of 40 minutes was spent with patient more than half of which was spent in counseling patient on the above mentioned issues , reviewing and explaining recent labs and imaging studies done, and coordination of care.  I have discontinued Grayland Ormond. Cancio's cetirizine and magnesium oxide. I am also having her maintain her Blood Glucose  Monitoring Suppl, fluocinonide cream, Vitamin D3, accu-chek multiclix, PREBIOTIC PRODUCT PO, glucose blood, metroNIDAZOLE, folic acid, amoxicillin, meloxicam, levothyroxine, and loratadine.  No orders of the defined types were placed in this encounter.   Medications Discontinued During This Encounter  Medication Reason  . cetirizine (ZYRTEC) 10 MG tablet   . magnesium oxide (MAG-OX) 400 MG tablet     Follow-up: No follow-ups on file.   [name], MD

## 2020-07-09 NOTE — Assessment & Plan Note (Signed)
Repeat enzymes are normal

## 2020-07-09 NOTE — Patient Instructions (Addendum)
Return on or after Oc 20 to check a1c and fasting labs    blood test today for kidney function . If still off we'll suspend meloxicam and recheck after Oct 20 with the other labs   Talk to accountant  about what is needed for tax purpose for the tub to shower transition   Referrrals to be made today be me:  Charlann Boxer:  Right groin/hip pain   [place]:  Bleeding callous on 3rd toe right foot needs attention

## 2020-07-09 NOTE — Assessment & Plan Note (Signed)
Thyroid function was  WNL in July on current dose.   Lab Results  Component Value Date   TSH 0.70 04/15/2020

## 2020-07-09 NOTE — Assessment & Plan Note (Signed)
Historically has had excellent control with diet alone.  Discussed starting metformin for weight loss and control if a1c is > 7.0 .  Patient is up-to-date on eye exams and foot exam was abnormal on her right 3rd toe. Patient has no history of  microalbuminuria . Patient is intolerant of statin therapy for CAD risk reduction. She is taking a baby asa for primary prevention.   Lab Results  Component Value Date   HGBA1C 7.2 (H) 04/15/2020   Lab Results  Component Value Date   MICROALBUR <0.7 04/15/2020

## 2020-07-09 NOTE — Assessment & Plan Note (Signed)
Right foot, third toe.  Has been managed by her pedicurist.  Given her diabetes, I am recommending  A podiatry referral.

## 2020-07-10 ENCOUNTER — Ambulatory Visit (INDEPENDENT_AMBULATORY_CARE_PROVIDER_SITE_OTHER): Payer: PPO

## 2020-07-10 VITALS — Ht 64.0 in | Wt 180.0 lb

## 2020-07-10 DIAGNOSIS — Z0000 Encounter for general adult medical examination without abnormal findings: Secondary | ICD-10-CM

## 2020-07-10 LAB — BASIC METABOLIC PANEL
BUN: 22 mg/dL (ref 6–23)
CO2: 24 mEq/L (ref 19–32)
Calcium: 10.4 mg/dL (ref 8.4–10.5)
Chloride: 103 mEq/L (ref 96–112)
Creatinine, Ser: 0.99 mg/dL (ref 0.40–1.20)
GFR: 54.38 mL/min — ABNORMAL LOW (ref >60.00)
Glucose, Bld: 160 mg/dL — ABNORMAL HIGH (ref 70–99)
Potassium: 4.2 mEq/L (ref 3.5–5.1)
Sodium: 139 mEq/L (ref 135–145)

## 2020-07-10 NOTE — Progress Notes (Signed)
Your kidney function is  BACK TO BASELINE.   Regarding your e mail question about balance classes,  I'm not sure if you are referring to Silver sne[employer] exercise classes  ?or I can refer you to  the Physical Therapists for one on one Physical Therapy for  balance rehab.   Regards,   Deborra Medina, MD

## 2020-07-10 NOTE — Patient Instructions (Addendum)
[name] , Thank you for taking time to come for your Medicare Wellness Visit. I appreciate your ongoing commitment to your health goals. Please review the following plan we discussed and let me know if I can assist you in the future.   These are the goals we discussed: Goals      Patient Stated   .  Increase physical activity (pt-stated)      Walk more for exercise Weight goal 170lb       This is a list of the screening recommended for you and due dates:  Health Maintenance  Topic Date Due  .  Hepatitis C: One time screening is recommended by Center for Disease Control  (CDC) for  adults born from [birth year] through 1964.   Never done  . Complete foot exam   12/31/2018  . Hemoglobin A1C  10/16/2020  . Eye exam for diabetics  01/29/2021  . Urine Protein Check  04/15/2021  . Mammogram  05/08/2021  . Tetanus Vaccine  08/01/2022  . Flu Shot  Completed  . DEXA scan (bone density measurement)  Completed  . COVID-19 Vaccine  Completed  . Pneumonia vaccines  Completed    Immunizations Immunization History  Administered Date(s) Administered  . Influenza, High Dose Seasonal PF 07/01/2017, 06/30/2018, 06/19/2019  . Influenza,inj,Quad PF,6+ Mos 07/28/2013, 07/18/2014  . Influenza-Unspecified 07/04/2015, 07/30/2016, 06/25/2020  . PFIZER SARS-COV-2 Vaccination 10/05/2019, 10/26/2019, 07/05/2020  . Pneumococcal Conjugate-13 05/21/2014  . Pneumococcal Polysaccharide-23 09/29/2003, 02/12/2012  . Tdap 08/01/2012  . Zoster 09/28/2009  . Zoster Recombinat (Shingrix) 07/26/2018, 12/02/2018, 06/22/2019   Keep all routine maintenance appointments.   Follow up 07/19/20 @ 8:45  Advanced directives: End of life planning; Advance aging; Advanced directives discussed.  Copy of current HCPOA/Living Will requested.    Conditions/risks identified: none new  Follow up in one year for your annual wellness visit.   Preventive Care 26 Years and Older, Female Preventive care refers to lifestyle  choices and visits with your health care provider that can promote health and wellness. What does preventive care include?  A yearly physical exam. This is also called an annual well check.  Dental exams once or twice a year.  Routine eye exams. Ask your health care provider how often you should have your eyes checked.  Personal lifestyle choices, including:  Daily care of your teeth and gums.  Regular physical activity.  Eating a healthy diet.  Avoiding tobacco and drug use.  Limiting alcohol use.  Practicing safe sex.  Taking low-dose aspirin every day.  Taking vitamin and mineral supplements as recommended by your health care provider. What happens during an annual well check? The services and screenings done by your health care provider during your annual well check will depend on your age, overall health, lifestyle risk factors, and family history of disease. Counseling  Your health care provider may ask you questions about your:  Alcohol use.  Tobacco use.  Drug use.  Emotional well-being.  Home and relationship well-being.  Sexual activity.  Eating habits.  History of falls.  Memory and ability to understand (cognition).  Work and work Statistician.  Reproductive health. Screening  You may have the following tests or measurements:  Height, weight, and BMI.  Blood pressure.  Lipid and cholesterol levels. These may be checked every 5 years, or more frequently if you are over 23 years old.  Skin check.  Lung cancer screening. You may have this screening every year starting at age 48 if you have a  30-pack-year history of smoking and currently smoke or have quit within the past 15 years.  Fecal occult blood test (FOBT) of the stool. You may have this test every year starting at age 20.  Flexible sigmoidoscopy or colonoscopy. You may have a sigmoidoscopy every 5 years or a colonoscopy every 10 years starting at age 13.  Hepatitis C blood  test.  Hepatitis B blood test.  Sexually transmitted disease (STD) testing.  Diabetes screening. This is done by checking your blood sugar (glucose) after you have not eaten for a while (fasting). You may have this done every 1-3 years.  Bone density scan. This is done to screen for osteoporosis. You may have this done starting at age 63.  Mammogram. This may be done every 1-2 years. Talk to your health care provider about how often you should have regular mammograms. Talk with your health care provider about your test results, treatment options, and if necessary, the need for more tests. Vaccines  Your health care provider may recommend certain vaccines, such as:  Influenza vaccine. This is recommended every year.  Tetanus, diphtheria, and acellular pertussis (Tdap, Td) vaccine. You may need a Td booster every 10 years.  Zoster vaccine. You may need this after age 65.  Pneumococcal 13-valent conjugate (PCV13) vaccine. One dose is recommended after age 83.  Pneumococcal polysaccharide (PPSV23) vaccine. One dose is recommended after age 33. Talk to your health care provider about which screenings and vaccines you need and how often you need them. This information is not intended to replace advice given to you by your health care provider. Make sure you discuss any questions you have with your health care provider. Document Released: 10/11/2015 Document Revised: 06/03/2016 Document Reviewed: 07/16/2015 Elsevier Interactive Patient Education  2017 [place] Prevention in the Home Falls can cause injuries. They can happen to people of all ages. There are many things you can do to make your home safe and to help prevent falls. What can I do on the outside of my home?  Regularly fix the edges of walkways and driveways and fix any cracks.  Remove anything that might make you trip as you walk through a door, such as a raised step or threshold.  Trim any bushes or trees on the  path to your home.  Use bright outdoor lighting.  Clear any walking paths of anything that might make someone trip, such as rocks or tools.  Regularly check to see if handrails are loose or broken. Make sure that both sides of any steps have handrails.  Any raised decks and porches should have guardrails on the edges.  Have any leaves, snow, or ice cleared regularly.  Use sand or salt on walking paths during winter.  Clean up any spills in your garage right away. This includes oil or grease spills. What can I do in the bathroom?  Use night lights.  Install grab bars by the toilet and in the tub and shower. Do not use towel bars as grab bars.  Use non-skid mats or decals in the tub or shower.  If you need to sit down in the shower, use a plastic, non-slip stool.  Keep the floor dry. Clean up any water that spills on the floor as soon as it happens.  Remove soap buildup in the tub or shower regularly.  Attach bath mats securely with double-sided non-slip rug tape.  Do not have throw rugs and other things on the floor that can make you  trip. What can I do in the bedroom?  Use night lights.  Make sure that you have a light by your bed that is easy to reach.  Do not use any sheets or blankets that are too big for your bed. They should not hang down onto the floor.  Have a firm chair that has side arms. You can use this for support while you get dressed.  Do not have throw rugs and other things on the floor that can make you trip. What can I do in the kitchen?  Clean up any spills right away.  Avoid walking on wet floors.  Keep items that you use a lot in easy-to-reach places.  If you need to reach something above you, use a strong step stool that has a grab bar.  Keep electrical cords out of the way.  Do not use floor polish or wax that makes floors slippery. If you must use wax, use non-skid floor wax.  Do not have throw rugs and other things on the floor that can  make you trip. What can I do with my stairs?  Do not leave any items on the stairs.  Make sure that there are handrails on both sides of the stairs and use them. Fix handrails that are broken or loose. Make sure that handrails are as long as the stairways.  Check any carpeting to make sure that it is firmly attached to the stairs. Fix any carpet that is loose or worn.  Avoid having throw rugs at the top or bottom of the stairs. If you do have throw rugs, attach them to the floor with carpet tape.  Make sure that you have a light switch at the top of the stairs and the bottom of the stairs. If you do not have them, ask someone to add them for you. What else can I do to help prevent falls?  Wear shoes that:  Do not have high heels.  Have rubber bottoms.  Are comfortable and fit you well.  Are closed at the toe. Do not wear sandals.  If you use a stepladder:  Make sure that it is fully opened. Do not climb a closed stepladder.  Make sure that both sides of the stepladder are locked into place.  Ask someone to hold it for you, if possible.  Clearly mark and make sure that you can see:  Any grab bars or handrails.  First and last steps.  Where the edge of each step is.  Use tools that help you move around (mobility aids) if they are needed. These include:  Canes.  Walkers.  Scooters.  Crutches.  Turn on the lights when you go into a dark area. Replace any light bulbs as soon as they burn out.  Set up your furniture so you have a clear path. Avoid moving your furniture around.  If any of your floors are uneven, fix them.  If there are any pets around you, be aware of where they are.  Review your medicines with your doctor. Some medicines can make you feel dizzy. This can increase your chance of falling. Ask your doctor what other things that you can do to help prevent falls. This information is not intended to replace advice given to you by your health care  provider. Make sure you discuss any questions you have with your health care provider. Document Released: 07/11/2009 Document Revised: 02/20/2016 Document Reviewed: 10/19/2014 Elsevier Interactive Patient Education  2017 [employer].

## 2020-07-10 NOTE — Progress Notes (Addendum)
Subjective:   Jacqueline Mata is a 79 y.o. female who presents for Medicare Annual (Subsequent) preventive examination.  Review of Systems    No ROS.  Medicare Wellness Virtual Visit.   Cardiac Risk Factors include: advanced age (>65men, >36 women);diabetes mellitus     Objective:    Today's Vitals   07/10/20 1112  Weight: 180 lb (81.6 kg)  Height: $Remove'5\' 4"'FDrJneZ$  (1.626 m)   Body mass index is 30.9 kg/m.  Advanced Directives 07/10/2020 06/20/2020 12/19/2019 06/30/2019 06/15/2019 12/08/2018 05/26/2018  Does Patient Have a Medical Advance Directive? Yes Yes No No No Yes No  Type of Paramedic of [place];Living will [place];Living will - - - - -  Does patient want to make changes to medical advance directive? No - Patient declined - - - - No - Patient declined -  Copy of [place] in Chart? No - copy requested - - - - - -  Would patient like information on creating a medical advance directive? - - No - Patient declined No - Patient declined No - Patient declined - -    Current Medications (verified) Outpatient Encounter Medications as of 07/10/2020  Medication Sig   acetaminophen (TYLENOL) 500 MG tablet Take 500 mg by mouth every 6 (six) hours as needed.   amoxicillin (AMOXIL) 500 MG capsule 500 mg. Dental procedures. Takes $RemoveBeforeDEI'2000mg'tJkZVoiakylmKyuB$  one hours prior to dental procedures.   Blood Glucose Monitoring Suppl KIT by Does not apply route.   Cholecalciferol (VITAMIN D3) 1000 UNITS CAPS Take 2 capsules by mouth daily.    fluocinonide cream (LIDEX) 9.62 % Apply 1 application topically daily as needed. Reported on 9/52/8413   folic acid (FOLVITE) 244 MCG tablet Take 400 mcg by mouth daily.   glucose blood (ACCU-CHEK AVIVA PLUS) test strip Use to check blood sugar once daily.   Lancets (ACCU-CHEK MULTICLIX) lancets Use once daily to check blood sugars   Dx:E11.9   levothyroxine (SYNTHROID) 88 MCG tablet TAKE 1 TABLET BY MOUTH DAILY BEFORE  BREAKFAST   loratadine (CLARITIN) 10 MG tablet Take 10 mg by mouth daily.   meloxicam (MOBIC) 15 MG tablet daily.    metroNIDAZOLE (METROGEL) 1 % gel APP TOPICALLY TO FACE NIGHTLY   PREBIOTIC PRODUCT PO Take 1 scoop by mouth daily.   Probiotic Product (PROBIOTIC DAILY PO) Take 1 tablet by mouth.   No facility-administered encounter medications on file as of 07/10/2020.    Allergies (verified) Codeine, Ilevro [nepafenac], Lamisil [terbinafine], Mold extract [trichophyton mentagrophyte], Other, Molds & smuts, Neomycin, Neomycin-bacitracin zn-polymyx, Neomycin-polymyxin-hc, Tape, Tapentadol, Terbinafine, and Tramadol   History: Past Medical History:  Diagnosis Date   breast cancer 2005   bilateral   Cataract 01/04/13 and 03/01/13   Diabetes mellitus    Hyperlipidemia    hypothyroidism    Personal history of chemotherapy    Personal history of radiation therapy    Past Surgical History:  Procedure Laterality Date   BREAST BIOPSY Left 2005   positive   BREAST BIOPSY Right 2005   positive   BREAST LUMPECTOMY Left 2005   BREAST LUMPECTOMY Right 2005   JOINT REPLACEMENT  2013   by Dr. Marry Guan   Family History  Problem Relation Age of Onset   Cancer Sister        lung, former tobacco   Cancer Brother 61       lung, thyroid, melanoma   Cancer Mother 40       ovarian  Diabetes Son    Breast cancer Maternal Grandfather    Social History   Socioeconomic History   Marital status: Divorced    Spouse name: Not on file   Number of children: Not on file   Years of education: Not on file   Highest education level: Not on file  Occupational History   Not on file  Tobacco Use   Smoking status: Former Smoker    Packs/day: 1.00    Years: 19.00    Pack years: 19.00    Types: Cigarettes    Start date: 01/02/1960    Quit date: 07/22/1978    Years since quitting: 41.9   Smokeless tobacco: Never Used   Tobacco comment: quit 36 years ago  Vaping Use   Vaping Use: Never used    Substance and Sexual Activity   Alcohol use: Yes    Alcohol/week: 0.0 standard drinks    Comment: rare   Drug use: No   Sexual activity: Never  Other Topics Concern   Not on file  Social History Narrative   Not on file   Social Determinants of Health   Financial Resource Strain: Low Risk    Difficulty of Paying Living Expenses: Not hard at all  Food Insecurity: No Food Insecurity   Worried About Programme researcher, broadcasting/film/video in the Last Year: Never true   Ran Out of Food in the Last Year: Never true  Transportation Needs: No Transportation Needs   Lack of Transportation (Medical): No   Lack of Transportation (Non-Medical): No  Physical Activity:    Days of Exercise per Week: Not on file   Minutes of Exercise per Session: Not on file  Stress: No Stress Concern Present   Feeling of Stress : Not at all  Social Connections:    Frequency of Communication with Friends and Family: Not on file   Frequency of Social Gatherings with Friends and Family: Not on file   Attends Religious Services: Not on file   Active Member of Clubs or Organizations: Not on file   Attends Banker Meetings: Not on file   Marital Status: Not on file    Tobacco Counseling Counseling given: Not Answered Comment: quit 36 years ago   Clinical Intake:  Pre-visit preparation completed: Yes        Diabetes: No  How often do you need to have someone help you when you read instructions, pamphlets, or other written materials from your doctor or pharmacy?: 1 - Never   Interpreter Needed?: No      Activities of Daily Living In your present state of health, do you have any difficulty performing the following activities: 07/10/2020  Hearing? N  Vision? N  Difficulty concentrating or making decisions? N  Walking or climbing stairs? N  Dressing or bathing? N  Doing errands, shopping? N  Preparing Food and eating ? N  Using the Toilet? N  In the past six months, have you accidently leaked  urine? N  Do you have problems with loss of bowel control? N  Managing your Medications? N  Managing your Finances? N  Housekeeping or managing your Housekeeping? N  Some recent data might be hidden    Patient Care Team: Sherlene Shams, MD as PCP - General (Internal Medicine)  Indicate any recent Medical Services you may have received from other than [facility] providers in the past year (date may be approximate).     Assessment:   This is a routine wellness examination for  Jacqueline Mata.  I connected with Charlcie today by telephone and verified that I am speaking with the correct person using two identifiers. Location patient: home Location provider: work Persons participating in the virtual visit: patient, Marine scientist.    I discussed the limitations, risks, security and privacy concerns of performing an evaluation and management service by telephone and the availability of in person appointments. The patient expressed understanding and verbally consented to this telephonic visit.    Interactive audio and video telecommunications were attempted between this provider and patient, however failed, due to patient having technical difficulties OR patient did not have access to video capability.  We continued and completed visit with audio only.  Some vital signs may be absent or patient reported.   Hearing/Vision screen  Hearing Screening   '125Hz'$  $Remo'250Hz'DZOAO$'500Hz'$'1000Hz'$'2000Hz'$'3000Hz'$'4000Hz'$'6000Hz'$'8000Hz'$   Right ear:           Left ear:           Comments: Stable and followed by Dr. Tami Ribas C/O some difficulty hearing at times  Does not wear hearing aids  Vision Screening Comments: Wears corrective lenses  Visual acuity not assessed, virtual visit. They have seen their ophthalmologist in the last 12 months.   Dietary issues and exercise activities discussed: Current Exercise Habits: Home exercise routine, Type of exercise: walking, Intensity: Mild  Healthy diet Good water intake  Goals        Patient Stated     Increase physical activity (pt-stated)      Walk more for exercise Weight goal 170lb       Depression Screen PHQ 2/9 Scores 07/10/2020 06/30/2019 12/31/2016 12/09/2015 10/15/2014 02/08/2014 02/07/2014  PHQ - 2 Score 0 0 1 0 0 0 1    Fall Risk Fall Risk  07/10/2020 07/09/2020 07/31/2019 06/30/2019 12/31/2016  Falls in the past year? (No Data) 1 0 0 No  Comment None since reported yesterday - - - -  Number falls in past yr: - 0 - - -  Injury with Fall? - 1 - - -  Follow up - Falls evaluation completed Falls evaluation completed - -   Handrails in use when climbing stairs? Yes Home free of loose throw rugs in walkways, pet beds, electrical cords, etc? Yes  Adequate lighting in your home to reduce risk of falls? Yes   ASSISTIVE DEVICES UTILIZED TO PREVENT FALLS: Use of a cane, walker or w/c? No   TIMED UP AND GO: Was the test performed? No . Virtual visit.   Cognitive Function: MMSE - Mini Mental State Exam 12/09/2015  Orientation to time 5  Orientation to Place 5  Registration 3  Attention/ Calculation 5  Recall 3  Language- name 2 objects 2  Language- repeat 1  Language- follow 3 step command 3  Language- read & follow direction 1  Write a sentence 1  Copy design 1  Total score 30     6CIT Screen 07/10/2020 06/30/2019  What Year? 0 points 0 points  What month? 0 points 0 points  What time? - 0 points  Count back from 20 - 0 points  Months in reverse 0 points 0 points  Repeat phrase - 0 points  Total Score - 0    Immunizations Immunization History  Administered Date(s) Administered   Influenza, High Dose Seasonal PF 07/01/2017, 06/30/2018, 06/19/2019   Influenza,inj,Quad PF,6+ Mos 07/28/2013, 07/18/2014   Influenza-Unspecified 07/04/2015, 07/30/2016, 06/25/2020   PFIZER SARS-COV-2 Vaccination 10/05/2019, 10/26/2019, 07/05/2020   Pneumococcal Conjugate-13 05/21/2014  Pneumococcal Polysaccharide-23 09/29/2003, 02/12/2012   Tdap 08/01/2012   Zoster  09/28/2009   Zoster Recombinat (Shingrix) 07/26/2018, 12/02/2018, 06/22/2019   Health Maintenance Health Maintenance  Topic Date Due   Hepatitis C Screening  Never done   FOOT EXAM  12/31/2018   HEMOGLOBIN A1C  10/16/2020   OPHTHALMOLOGY EXAM  01/29/2021   URINE MICROALBUMIN  04/15/2021   MAMMOGRAM  05/08/2021   TETANUS/TDAP  08/01/2022   INFLUENZA VACCINE  Completed   DEXA SCAN  Completed   COVID-19 Vaccine  Completed   PNA vac Low Risk Adult  Completed   Dental Screening: Recommended annual dental exams for proper oral hygiene  Community Resource Referral / Chronic Care Management: CRR required this visit?  No   CCM required this visit?  No      Plan:   Keep all routine maintenance appointments.   Follow up 07/19/20 @ 8:45  I have personally reviewed and noted the following in the patient's chart:   Medical and social history Use of alcohol, tobacco or illicit drugs  Current medications and supplements Functional ability and status Nutritional status Physical activity Advanced directives List of other physicians Hospitalizations, surgeries, and ER visits in previous 12 months Vitals Screenings to include cognitive, depression, and falls Referrals and appointments  In addition, I have reviewed and discussed with patient certain preventive protocols, quality metrics, and best practice recommendations. A written personalized care plan for preventive services as well as general preventive health recommendations were provided to patient via mychart.     OBrien-Blaney, Yailine Ballard L, LPN   [id number]/52/[id number]     I have reviewed the above information and agree with above.   Deborra Medina, MD

## 2020-07-15 ENCOUNTER — Ambulatory Visit: Payer: PPO | Admitting: Podiatry

## 2020-07-15 ENCOUNTER — Encounter: Payer: Self-pay | Admitting: Podiatry

## 2020-07-15 ENCOUNTER — Other Ambulatory Visit: Payer: Self-pay

## 2020-07-15 DIAGNOSIS — M2041 Other hammer toe(s) (acquired), right foot: Secondary | ICD-10-CM | POA: Diagnosis not present

## 2020-07-15 DIAGNOSIS — E119 Type 2 diabetes mellitus without complications: Secondary | ICD-10-CM

## 2020-07-15 DIAGNOSIS — L84 Corns and callosities: Secondary | ICD-10-CM

## 2020-07-15 NOTE — Progress Notes (Signed)
  Subjective:  Patient ID: Jacqueline Mata, female    DOB: May 21, 1941,  MRN: [id number]  Chief Complaint  Patient presents with  . Callouses    Patient presents today for callous right 3rd toe and blister top of right 3rd toe.  She denies any pain, but said that the callous has some bleeding at times    79 y.o. female presents with the above complaint. History confirmed with patient.  She has type 2 diabetes and has been diet controlled, her recent A1c was above 7% and she is going to be started on Metformin soon.  She also has a cyst on the tip of this toe that her dermatologist has drained but has come back.  Objective:  Physical Exam: warm, good capillary refill, no trophic changes or ulcerative lesions, normal DP and PT pulses and normal sensory exam.    Right Foot: Semireducible hammertoe contractures with adductovarus component contracture of the third fourth and fifth, there is a preulcerative callus on the distal tip of the third.  Mucoid cyst over DIPJ   Assessment:   1. Hammertoe of right foot   2. Pre-ulcerative calluses   3. Diabetes mellitus type 2, diet-controlled ([name])      Plan:  Patient was evaluated and treated and all questions answered.  I discussed with her the etiology and treatment options for the hammertoe contractures.  I also debrided the callus, there is no underlying deep wound.  Today I recommend we offload this with a silicone toe crest and this was dispensed today.  I discussed with her that this is not improving we should consider flexor tenotomy to reduce the contracture and prevent ulceration and risk of infection or limb loss.  We also discussed that if flexor tenotomy is unsuccessful that hammertoe correction with DIPJ arthrodesis would be considered  No follow-ups on file.

## 2020-07-18 ENCOUNTER — Encounter: Payer: Self-pay | Admitting: Family Medicine

## 2020-07-18 ENCOUNTER — Other Ambulatory Visit: Payer: Self-pay

## 2020-07-18 ENCOUNTER — Ambulatory Visit (INDEPENDENT_AMBULATORY_CARE_PROVIDER_SITE_OTHER): Payer: PPO

## 2020-07-18 ENCOUNTER — Ambulatory Visit (INDEPENDENT_AMBULATORY_CARE_PROVIDER_SITE_OTHER): Payer: PPO | Admitting: Family Medicine

## 2020-07-18 VITALS — BP 120/88 | HR 68 | Ht 64.0 in | Wt 180.0 lb

## 2020-07-18 DIAGNOSIS — M25551 Pain in right hip: Secondary | ICD-10-CM

## 2020-07-18 DIAGNOSIS — M1611 Unilateral primary osteoarthritis, right hip: Secondary | ICD-10-CM | POA: Diagnosis not present

## 2020-07-18 DIAGNOSIS — M545 Low back pain, unspecified: Secondary | ICD-10-CM

## 2020-07-18 DIAGNOSIS — M47816 Spondylosis without myelopathy or radiculopathy, lumbar region: Secondary | ICD-10-CM | POA: Diagnosis not present

## 2020-07-18 NOTE — Progress Notes (Signed)
[place] Phone: [phone] Subjective:   Jacqueline Mata, am serving as a scribe for Dr. Hulan Saas. This visit occurred during the SARS-CoV-2 public health emergency.  Safety protocols were in place, including screening questions prior to the visit, additional usage of staff PPE, and extensive cleaning of exam room while observing appropriate contact time as indicated for disinfecting solutions.   I'm seeing this patient by the request  of:  [name], MD  CC: right leg and hip pain   [id number]  Jacqueline Mata is a 79 y.o. female coming in with complaint of leg pain. Last seen for cervical radiculopathy in 2016. History of bilateral TKR. Patient states pain medially aspect of knee but quickly moved to hip area.  Has been getting massages. Pain is now in the adductor that seems to be radiating proximally. When climbing stairs the lateral aspect of leg will hurt. Using Meloxicam for pain. Does see chiropractor every 2 weeks. Not noticing improvement with it.     Past Medical History:  Diagnosis Date  . breast cancer 2005   bilateral  . Cataract 01/04/13 and 03/01/13  . Diabetes mellitus   . Hyperlipidemia   . hypothyroidism   . Personal history of chemotherapy   . Personal history of radiation therapy    Past Surgical History:  Procedure Laterality Date  . BREAST BIOPSY Left 2005   positive  . BREAST BIOPSY Right 2005   positive  . BREAST LUMPECTOMY Left 2005  . BREAST LUMPECTOMY Right 2005  . JOINT REPLACEMENT  2013   by Dr. Marry Guan   Social History   Socioeconomic History  . Marital status: Divorced    Spouse name: Not on file  . Number of children: Not on file  . Years of education: Not on file  . Highest education level: Not on file  Occupational History  . Not on file  Tobacco Use  . Smoking status: Former Smoker    Packs/day: 1.00    Years: 19.00    Pack years: 19.00     Types: Cigarettes    Start date: 01/02/1960    Quit date: 07/22/1978    Years since quitting: 42.0  . Smokeless tobacco: Never Used  . Tobacco comment: quit 36 years ago  Vaping Use  . Vaping Use: Never used  Substance and Sexual Activity  . Alcohol use: Yes    Alcohol/week: 0.0 standard drinks    Comment: rare  . Drug use: Mata  . Sexual activity: Never  Other Topics Concern  . Not on file  Social History Narrative  . Not on file   Social Determinants of Health   Financial Resource Strain: Low Risk   . Difficulty of Paying Living Expenses: Not hard at all  Food Insecurity: Mata Food Insecurity  . Worried About Charity fundraiser in the Last Year: Never true  . Ran Out of Food in the Last Year: Never true  Transportation Needs: Mata Transportation Needs  . Lack of Transportation (Medical): Mata  . Lack of Transportation (Non-Medical): Mata  Physical Activity:   . Days of Exercise per Week: Not on file  . Minutes of Exercise per Session: Not on file  Stress: Mata Stress Concern Present  . Feeling of Stress : Not at all  Social Connections:   . Frequency of Communication with Friends and Family: Not on file  . Frequency of Social Gatherings with Friends and  Family: Not on file  . Attends Religious Services: Not on file  . Active Member of Clubs or Organizations: Not on file  . Attends Archivist Meetings: Not on file  . Marital Status: Not on file   Allergies  Allergen Reactions  . Codeine Shortness Of Breath and Other (See Comments)    Altered mental staus  . Ilevro [Nepafenac] Other (See Comments)    Eye swelling   . Lamisil [Terbinafine] Rash and Other (See Comments)    Dysphagia and skin fungus  . Mold Extract [Trichophyton Mentagrophyte] Other (See Comments)  . Other Swelling    ILLEVRO (EYE DROPS)- EYE SWELLING  DOGS.Congestion. FEATHERS.Congestion  . Molds & Smuts Other (See Comments)    Congestion.  Marland Kitchen Neomycin Rash  . Neomycin-Bacitracin Zn-Polymyx  Rash  . Neomycin-Polymyxin-Hc Rash  . Tape Dermatitis    Blisters  . Tapentadol Nausea And Vomiting and Other (See Comments)  . Terbinafine Rash    Trouble swallowing  . Tramadol Other (See Comments), Nausea Only and Nausea And Vomiting    dizziness   Family History  Problem Relation Age of Onset  . Cancer Sister        lung, former tobacco  . Cancer Brother 84       lung, thyroid, melanoma  . Cancer Mother 31       ovarian  . Diabetes Son   . Breast cancer Maternal Grandfather     Current Outpatient Medications (Endocrine & Metabolic):  .  levothyroxine (SYNTHROID) 88 MCG tablet, TAKE 1 TABLET BY MOUTH DAILY BEFORE BREAKFAST   Current Outpatient Medications (Respiratory):  .  loratadine (CLARITIN) 10 MG tablet, Take 10 mg by mouth daily.  Current Outpatient Medications (Analgesics):  .  acetaminophen (TYLENOL) 500 MG tablet, Take 500 mg by mouth every 6 (six) hours as needed. .  meloxicam (MOBIC) 15 MG tablet, daily.   Current Outpatient Medications (Hematological):  .  folic acid (FOLVITE) 323 MCG tablet, Take 400 mcg by mouth daily.  Current Outpatient Medications (Other):  .  amoxicillin (AMOXIL) 500 MG capsule, 500 mg. Dental procedures. Takes $RemoveBeforeDEI'2000mg'coxZnKiAcckSzVSF$  one hours prior to dental procedures. .  Blood Glucose Monitoring Suppl KIT, by Does not apply route. .  Cholecalciferol (VITAMIN D3) 1000 UNITS CAPS, Take 2 capsules by mouth daily.  .  fluocinonide cream (LIDEX) 5.57 %, Apply 1 application topically daily as needed. Reported on 12/09/2015 .  glucose blood (ACCU-CHEK AVIVA PLUS) test strip, Use to check blood sugar once daily. .  Lancets (ACCU-CHEK MULTICLIX) lancets, Use once daily to check blood sugars   Dx:E11.9 .  Probiotic Product (PROBIOTIC DAILY PO), Take 1 tablet by mouth.   Reviewed prior external information including notes and imaging from  primary care provider As well as notes that were available from care everywhere and other healthcare systems.  Past  medical history, social, surgical and family history all reviewed in electronic medical record.  Mata pertanent information unless stated regarding to the chief complaint.   Review of Systems:  Mata headache, visual changes, nausea, vomiting, diarrhea, constipation, dizziness, abdominal pain, skin rash, fevers, chills, night sweats, weight loss, swollen lymph nodes, body aches, joint swelling, chest pain, shortness of breath, mood changes. POSITIVE muscle aches denies lower extremity swelling or calf pain   Objective  Blood pressure 120/88, pulse 68, height $RemoveBe'5\' 4"'zwBVCYEGW$  (1.626 m), weight 180 lb (81.6 kg), SpO2 98 %.   General: Mata apparent distress alert and oriented x3 mood and affect normal, dressed appropriately.  HEENT: Pupils equal, extraocular movements intact  Respiratory: Patient's speak in full sentences and does not appear short of breath  antalgic gait.  MSK:  Right hip has less then 5 degrees of internal rom.  Mild lack of external as well, mild pain over lateral aspect of hip at GT left hip also has limted ROM with internal but not as severe.  Mild TTP over right SIJ  [id number]; 15 additional minutes spent for Therapeutic exercises as stated in above notes.  This included exercises focusing on stretching, strengthening, with significant focus on eccentric aspects.   Long term goals include an improvement in range of motion, strength, endurance as well as avoiding reinjury. Patient's frequency would include in 1-2 times a day, 3-5 times a week for a duration of 6-12 weeks. Hip strengthening exercises which included:  Pelvic tilt/bracing to help with proper recruitment of the lower abs and pelvic floor muscles  Glute strengthening to properly contract glutes without over-engaging low back and hamstrings - prone hip extension and glute bridge exercises Proper stretching techniques to increase effectiveness for the hip flexors, groin, quads, piriformic and low back when appropriate    Proper technique  shown and discussed handout in great detail with ATC.  All questions were discussed and answered.     Impression and Recommendations:     The above documentation has been reviewed and is accurate and complete Lyndal Pulley, DO

## 2020-07-18 NOTE — Patient Instructions (Signed)
Vit D 2000IU Tart Cherry 1200mg  at night Xrays today Appt in 6 weeks if we decide not to go surgical route

## 2020-07-19 ENCOUNTER — Other Ambulatory Visit: Payer: Self-pay

## 2020-07-19 ENCOUNTER — Other Ambulatory Visit (INDEPENDENT_AMBULATORY_CARE_PROVIDER_SITE_OTHER): Payer: PPO

## 2020-07-19 DIAGNOSIS — E119 Type 2 diabetes mellitus without complications: Secondary | ICD-10-CM

## 2020-07-19 DIAGNOSIS — E039 Hypothyroidism, unspecified: Secondary | ICD-10-CM

## 2020-07-19 LAB — LIPID PANEL
Cholesterol: 236 mg/dL — ABNORMAL HIGH (ref 0–200)
HDL: 84.2 mg/dL (ref >39.00)
LDL Cholesterol: 133 mg/dL — ABNORMAL HIGH (ref 0–99)
NonHDL: 152.29
Total CHOL/HDL Ratio: 3
Triglycerides: 95 mg/dL (ref 0.0–149.0)
VLDL: 19 mg/dL (ref 0.0–40.0)

## 2020-07-19 LAB — COMPREHENSIVE METABOLIC PANEL
ALT: 22 U/L (ref 0–35)
AST: 18 U/L (ref 0–37)
Albumin: 4.4 g/dL (ref 3.5–5.2)
Alkaline Phosphatase: 61 U/L (ref 39–117)
BUN: 18 mg/dL (ref 6–23)
CO2: 25 mEq/L (ref 19–32)
Calcium: 9.7 mg/dL (ref 8.4–10.5)
Chloride: 104 mEq/L (ref 96–112)
Creatinine, Ser: 1.04 mg/dL (ref 0.40–1.20)
GFR: 51.47 mL/min — ABNORMAL LOW (ref >60.00)
Glucose, Bld: 154 mg/dL — ABNORMAL HIGH (ref 70–99)
Potassium: 4.1 mEq/L (ref 3.5–5.1)
Sodium: 140 mEq/L (ref 135–145)
Total Bilirubin: 0.9 mg/dL (ref 0.2–1.2)
Total Protein: 6.7 g/dL (ref 6.0–8.3)

## 2020-07-19 LAB — TSH: TSH: 1.83 u[IU]/mL (ref 0.35–4.50)

## 2020-07-19 LAB — HEMOGLOBIN A1C: Hgb A1c MFr Bld: 7.5 % — ABNORMAL HIGH (ref 4.6–6.5)

## 2020-07-20 ENCOUNTER — Encounter: Payer: Self-pay | Admitting: Family Medicine

## 2020-07-20 DIAGNOSIS — M1611 Unilateral primary osteoarthritis, right hip: Secondary | ICD-10-CM | POA: Insufficient documentation

## 2020-07-20 MED ORDER — METFORMIN HCL ER 500 MG PO TB24: 500.0000 mg | ORAL_TABLET | Freq: Every day | ORAL | 1 refills | Status: DC

## 2020-07-20 NOTE — Assessment & Plan Note (Signed)
Adding metformin for aqc 7.5  Lab Results  Component Value Date   HGBA1C 7.5 (H) 07/19/2020

## 2020-07-20 NOTE — Assessment & Plan Note (Signed)
Significant decrease in ROM, pain in the groin with internal rotation, antalgic gait concern for severe OA of the right hip. Discussed OTC meds, HEP and xray pending.  Patient did do very well with knee replacement and likely will do well with surgery if needed. Many allergy to medications and environment so avoided prescription meds for now.  RTC in 4-6 weeks  Worsening pain patient may call and we will further evaluate, severe pain then encouraged seeking medical attention immediately.

## 2020-07-20 NOTE — Progress Notes (Signed)
Thank you for your earlier message!  Keep me posted about the PT referral.  Your diabetes is under  diminishing control on diet alone  and your a1c has increased to 7.5 (goal is < 7.00).     I recommend starting a low dose of once daily metformin to  help lower your sugars gradually.  If you do not tolerate it secondary to loose stools which do not resolve after two weeks, let me know, so I can prescribe an alternative   Your cholesterol  has also risen, but your HDL is also very high, which is good!   Please plan to return in 4 months ,  and repeat labs  prior to your next visit .    Regards,  Dr. Derrel Nip

## 2020-07-20 NOTE — Addendum Note (Signed)
Addended by: [name] on: 07/20/2020 06:14 PM   Modules accepted: Orders

## 2020-07-23 DIAGNOSIS — R519 Headache, unspecified: Secondary | ICD-10-CM | POA: Diagnosis not present

## 2020-07-23 DIAGNOSIS — M5033 Other cervical disc degeneration, cervicothoracic region: Secondary | ICD-10-CM | POA: Diagnosis not present

## 2020-07-23 DIAGNOSIS — M9902 Segmental and somatic dysfunction of thoracic region: Secondary | ICD-10-CM | POA: Diagnosis not present

## 2020-07-23 DIAGNOSIS — M9901 Segmental and somatic dysfunction of cervical region: Secondary | ICD-10-CM | POA: Diagnosis not present

## 2020-07-24 DIAGNOSIS — R29898 Other symptoms and signs involving the musculoskeletal system: Secondary | ICD-10-CM

## 2020-07-24 DIAGNOSIS — M5432 Sciatica, left side: Secondary | ICD-10-CM

## 2020-07-24 DIAGNOSIS — H832X3 Labyrinthine dysfunction, bilateral: Secondary | ICD-10-CM

## 2020-07-24 DIAGNOSIS — M25551 Pain in right hip: Secondary | ICD-10-CM

## 2020-07-25 ENCOUNTER — Telehealth: Payer: Self-pay

## 2020-07-25 DIAGNOSIS — E119 Type 2 diabetes mellitus without complications: Secondary | ICD-10-CM

## 2020-07-25 DIAGNOSIS — E782 Mixed hyperlipidemia: Secondary | ICD-10-CM

## 2020-07-25 DIAGNOSIS — E039 Hypothyroidism, unspecified: Secondary | ICD-10-CM

## 2020-07-25 NOTE — Telephone Encounter (Signed)
Labs have been ordered and appt has been scheduled. Pt is aware of appt date and time.  

## 2020-07-25 NOTE — Telephone Encounter (Signed)
Pt needs lab orders prior to four month follow up in March. Please call pt to schedule

## 2020-08-06 DIAGNOSIS — M9901 Segmental and somatic dysfunction of cervical region: Secondary | ICD-10-CM | POA: Diagnosis not present

## 2020-08-06 DIAGNOSIS — M5033 Other cervical disc degeneration, cervicothoracic region: Secondary | ICD-10-CM | POA: Diagnosis not present

## 2020-08-06 DIAGNOSIS — M9902 Segmental and somatic dysfunction of thoracic region: Secondary | ICD-10-CM | POA: Diagnosis not present

## 2020-08-06 DIAGNOSIS — R519 Headache, unspecified: Secondary | ICD-10-CM | POA: Diagnosis not present

## 2020-08-13 DIAGNOSIS — M25551 Pain in right hip: Secondary | ICD-10-CM | POA: Diagnosis not present

## 2020-08-13 DIAGNOSIS — H832X3 Labyrinthine dysfunction, bilateral: Secondary | ICD-10-CM | POA: Diagnosis not present

## 2020-08-20 DIAGNOSIS — M9902 Segmental and somatic dysfunction of thoracic region: Secondary | ICD-10-CM | POA: Diagnosis not present

## 2020-08-20 DIAGNOSIS — M25551 Pain in right hip: Secondary | ICD-10-CM | POA: Diagnosis not present

## 2020-08-20 DIAGNOSIS — H832X3 Labyrinthine dysfunction, bilateral: Secondary | ICD-10-CM | POA: Diagnosis not present

## 2020-08-20 DIAGNOSIS — M5033 Other cervical disc degeneration, cervicothoracic region: Secondary | ICD-10-CM | POA: Diagnosis not present

## 2020-08-20 DIAGNOSIS — R519 Headache, unspecified: Secondary | ICD-10-CM | POA: Diagnosis not present

## 2020-08-20 DIAGNOSIS — M9901 Segmental and somatic dysfunction of cervical region: Secondary | ICD-10-CM | POA: Diagnosis not present

## 2020-08-26 DIAGNOSIS — M25551 Pain in right hip: Secondary | ICD-10-CM | POA: Diagnosis not present

## 2020-08-26 DIAGNOSIS — H832X3 Labyrinthine dysfunction, bilateral: Secondary | ICD-10-CM | POA: Diagnosis not present

## 2020-08-28 DIAGNOSIS — M25551 Pain in right hip: Secondary | ICD-10-CM | POA: Diagnosis not present

## 2020-08-28 DIAGNOSIS — H832X3 Labyrinthine dysfunction, bilateral: Secondary | ICD-10-CM | POA: Diagnosis not present

## 2020-08-29 ENCOUNTER — Encounter: Payer: Self-pay | Admitting: Family Medicine

## 2020-08-29 ENCOUNTER — Ambulatory Visit: Payer: PPO | Admitting: Family Medicine

## 2020-08-29 ENCOUNTER — Other Ambulatory Visit: Payer: Self-pay

## 2020-08-29 VITALS — BP 118/82 | HR 101 | Ht 64.0 in | Wt 178.0 lb

## 2020-08-29 DIAGNOSIS — M1611 Unilateral primary osteoarthritis, right hip: Secondary | ICD-10-CM | POA: Diagnosis not present

## 2020-08-29 NOTE — Patient Instructions (Signed)
Sorry for bad news Cassie Freer [phone] Dr. Latanya Maudlin I'm here if you have questions Happy Holidays

## 2020-08-29 NOTE — Assessment & Plan Note (Signed)
Hip arthritis.  Severe on the right side and moderate on the left side.  At this point patient is having worsening pain with the physical therapy.  Will refer to patient's choice of orthopedic surgeon that I think will be beneficial.  We discussed with her about the only other options are injections which I do not think will be highly likely to help.  Patient is in agreement with the plan but can call if she has any questions.  Total time with patient answering all questions 36 minutes

## 2020-08-29 NOTE — Progress Notes (Signed)
[place] Phone: [phone] Subjective:   Fontaine No, am serving as a scribe for Dr. Hulan Saas. This visit occurred during the SARS-CoV-2 public health emergency.  Safety protocols were in place, including screening questions prior to the visit, additional usage of staff PPE, and extensive cleaning of exam room while observing appropriate contact time as indicated for disinfecting solutions.   I'm seeing this patient by the request  of:  [name], MD  CC: Right hip pain follow-up  IBB:[id number]   07/18/2020 Significant decrease in ROM, pain in the groin with internal rotation, antalgic gait concern for severe OA of the right hip. Discussed OTC meds, HEP and xray pending.  Patient did do very well with knee replacement and likely will do well with surgery if needed. Many allergy to medications and environment so avoided prescription meds for now.  RTC in 4-6 weeks  Worsening pain patient may call and we will further evaluate, severe pain then encouraged seeking medical attention immediately.   Update 08/29/2020 NARCISSA MELDER is a 79 y.o. female coming in with complaint of right hip pain. Patient states that she has done about 2 sessions of physical therapy. States that she began to have more pain during therapy. Patient states that her right piriformis is where most of her pain is at but that the left is also starting to hurt.   Xray lumbar 07/18/2020 IMPRESSION: Degenerative change without acute abnormality.  Right Hip xray 07/18/2020 IMPRESSION: Advanced arthritis of the right greater than left hip    Past Medical History:  Diagnosis Date  . breast cancer 2005   bilateral  . Cataract 01/04/13 and 03/01/13  . Diabetes mellitus   . Hyperlipidemia   . hypothyroidism   . Personal history of chemotherapy   . Personal history of radiation therapy    Past Surgical History:  Procedure  Laterality Date  . BREAST BIOPSY Left 2005   positive  . BREAST BIOPSY Right 2005   positive  . BREAST LUMPECTOMY Left 2005  . BREAST LUMPECTOMY Right 2005  . JOINT REPLACEMENT  2013   by Dr. Marry Guan   Social History   Socioeconomic History  . Marital status: Divorced    Spouse name: Not on file  . Number of children: Not on file  . Years of education: Not on file  . Highest education level: Not on file  Occupational History  . Not on file  Tobacco Use  . Smoking status: Former Smoker    Packs/day: 1.00    Years: 19.00    Pack years: 19.00    Types: Cigarettes    Start date: 01/02/1960    Quit date: 07/22/1978    Years since quitting: 42.1  . Smokeless tobacco: Never Used  . Tobacco comment: quit 36 years ago  Vaping Use  . Vaping Use: Never used  Substance and Sexual Activity  . Alcohol use: Yes    Alcohol/week: 0.0 standard drinks    Comment: rare  . Drug use: No  . Sexual activity: Never  Other Topics Concern  . Not on file  Social History Narrative  . Not on file   Social Determinants of Health   Financial Resource Strain: Low Risk   . Difficulty of Paying Living Expenses: Not hard at all  Food Insecurity: No Food Insecurity  . Worried About Charity fundraiser in the Last Year: Never true  . Ran Out of  Food in the Last Year: Never true  Transportation Needs: No Transportation Needs  . Lack of Transportation (Medical): No  . Lack of Transportation (Non-Medical): No  Physical Activity:   . Days of Exercise per Week: Not on file  . Minutes of Exercise per Session: Not on file  Stress: No Stress Concern Present  . Feeling of Stress : Not at all  Social Connections:   . Frequency of Communication with Friends and Family: Not on file  . Frequency of Social Gatherings with Friends and Family: Not on file  . Attends Religious Services: Not on file  . Active Member of Clubs or Organizations: Not on file  . Attends Archivist Meetings: Not on file  .  Marital Status: Not on file   Allergies  Allergen Reactions  . Codeine Shortness Of Breath and Other (See Comments)    Altered mental staus  . Ilevro [Nepafenac] Other (See Comments)    Eye swelling   . Lamisil [Terbinafine] Rash and Other (See Comments)    Dysphagia and skin fungus  . Mold Extract [Trichophyton Mentagrophyte] Other (See Comments)  . Other Swelling    ILLEVRO (EYE DROPS)- EYE SWELLING  DOGS.Congestion. FEATHERS.Congestion  . Molds & Smuts Other (See Comments)    Congestion.  Marland Kitchen Neomycin Rash  . Neomycin-Bacitracin Zn-Polymyx Rash  . Neomycin-Polymyxin-Hc Rash  . Tape Dermatitis    Blisters  . Tapentadol Nausea And Vomiting and Other (See Comments)  . Terbinafine Rash    Trouble swallowing  . Tramadol Other (See Comments), Nausea Only and Nausea And Vomiting    dizziness   Family History  Problem Relation Age of Onset  . Cancer Sister        lung, former tobacco  . Cancer Brother 14       lung, thyroid, melanoma  . Cancer Mother 85       ovarian  . Diabetes Son   . Breast cancer Maternal Grandfather     Current Outpatient Medications (Endocrine & Metabolic):  .  levothyroxine (SYNTHROID) 88 MCG tablet, TAKE 1 TABLET BY MOUTH DAILY BEFORE BREAKFAST .  metFORMIN (GLUCOPHAGE-XR) 500 MG 24 hr tablet, Take 1 tablet (500 mg total) by mouth daily with breakfast.   Current Outpatient Medications (Respiratory):  .  loratadine (CLARITIN) 10 MG tablet, Take 10 mg by mouth daily.  Current Outpatient Medications (Analgesics):  .  acetaminophen (TYLENOL) 500 MG tablet, Take 500 mg by mouth every 6 (six) hours as needed. .  meloxicam (MOBIC) 15 MG tablet, daily.   Current Outpatient Medications (Hematological):  .  folic acid (FOLVITE) 417 MCG tablet, Take 400 mcg by mouth daily.  Current Outpatient Medications (Other):  .  amoxicillin (AMOXIL) 500 MG capsule, 500 mg. Dental procedures. Takes 2056m one hours prior to dental procedures. .  Blood Glucose  Monitoring Suppl KIT, by Does not apply route. .  Cholecalciferol (VITAMIN D3) 1000 UNITS CAPS, Take 2 capsules by mouth daily.  .  fluocinonide cream (LIDEX) 04.08%, Apply 1 application topically daily as needed. Reported on 12/09/2015 .  glucose blood (ACCU-CHEK AVIVA PLUS) test strip, Use to check blood sugar once daily. .  Lancets (ACCU-CHEK MULTICLIX) lancets, Use once daily to check blood sugars   Dx:E11.9 .  Probiotic Product (PROBIOTIC DAILY PO), Take 1 tablet by mouth.   Reviewed prior external information including notes and imaging from  primary care provider As well as notes that were available from care everywhere and other healthcare systems.  Past  medical history, social, surgical and family history all reviewed in electronic medical record.  No pertanent information unless stated regarding to the chief complaint.   Review of Systems:  No headache, visual changes, nausea, vomiting, diarrhea, constipation, dizziness, abdominal pain, skin rash, fevers, chills, night sweats, weight loss, swollen lymph nodes, body aches, joint swelling, chest pain, shortness of breath, mood changes. POSITIVE muscle aches  Objective  Blood pressure 118/82, pulse (!) 101, height 5' 4" (1.626 m), weight 178 lb (80.7 kg), SpO2 96 %.   General: No apparent distress alert and oriented x3 mood and affect normal, dressed appropriately.  HEENT: Pupils equal, extraocular movements intact  Respiratory: Patient's speak in full sentences and does not appear short of breath  Cardiovascular: No lower extremity edema, non tender, no erythema  \Mild antalgic gait noted.  Favoring right hip Right hip has less than 5 degrees of internal rotation.  External rotation of only 30 degrees.  Patient does have decent strength though.  Neurovascularly intact distally.    Impression and Recommendations:     The above documentation has been reviewed and is accurate and complete Lyndal Pulley, DO

## 2020-09-03 DIAGNOSIS — M5033 Other cervical disc degeneration, cervicothoracic region: Secondary | ICD-10-CM | POA: Diagnosis not present

## 2020-09-03 DIAGNOSIS — M9901 Segmental and somatic dysfunction of cervical region: Secondary | ICD-10-CM | POA: Diagnosis not present

## 2020-09-03 DIAGNOSIS — R519 Headache, unspecified: Secondary | ICD-10-CM | POA: Diagnosis not present

## 2020-09-03 DIAGNOSIS — M9902 Segmental and somatic dysfunction of thoracic region: Secondary | ICD-10-CM | POA: Diagnosis not present

## 2020-09-04 DIAGNOSIS — M1611 Unilateral primary osteoarthritis, right hip: Secondary | ICD-10-CM | POA: Diagnosis not present

## 2020-09-09 ENCOUNTER — Encounter: Payer: Self-pay | Admitting: Podiatry

## 2020-09-09 ENCOUNTER — Other Ambulatory Visit: Payer: Self-pay

## 2020-09-09 ENCOUNTER — Ambulatory Visit: Payer: PPO | Admitting: Podiatry

## 2020-09-09 ENCOUNTER — Ambulatory Visit (INDEPENDENT_AMBULATORY_CARE_PROVIDER_SITE_OTHER): Payer: PPO

## 2020-09-09 ENCOUNTER — Other Ambulatory Visit: Payer: Self-pay | Admitting: Podiatry

## 2020-09-09 DIAGNOSIS — M2041 Other hammer toe(s) (acquired), right foot: Secondary | ICD-10-CM

## 2020-09-09 DIAGNOSIS — L84 Corns and callosities: Secondary | ICD-10-CM | POA: Diagnosis not present

## 2020-09-09 DIAGNOSIS — M2042 Other hammer toe(s) (acquired), left foot: Secondary | ICD-10-CM

## 2020-09-09 NOTE — Progress Notes (Signed)
  Subjective:  Patient ID: Jacqueline Mata, female    DOB: [birth year],  MRN: [id number]  Chief Complaint  Patient presents with  . Hammer Toe  . Callouses    79 y.o. female returns with the above complaint. History confirmed with patient.  She is no longer on Metformin his diet controlled now.  Cyst is not bothersome on the right third toe.  The callus is starting to come back to.  The silicone toe crest was not helpful.  Objective:  Physical Exam: warm, good capillary refill, no trophic changes or ulcerative lesions, normal DP and PT pulses and normal sensory exam.    Right Foot: Semireducible hammertoe contractures with adductovarus component contracture of the third fourth and fifth, there is a preulcerative callus on the distal tip of the third.  Mucoid cyst over DIPJ  X-rays of both feet: Adductovarus hammertoe contractures 2 through 5 bilaterally Assessment:   1. Hammertoe of right foot   2. Hammertoe of left foot      Plan:  Patient was evaluated and treated and all questions answered.  I discussed with her the etiology and treatment options for the hammertoe contractures.  She has had recurrent callus of the right third toe.  We discussed other treatment options.  I think given the ability to reduce the contracture I think flexor tenotomy would reduce the callus formation.  This is the only toe that bothersome for her.  We will continue to monitor the other toes in the meantime.  If they become bothersome we will treat them as needed.  I do think her sports would help as well which she is interested in.  We will try prefabricated power steps before moving to custom molded orthoses.  Hammertoe right, 3rd toe -Discussed proceeding with flexor tenotomy procedure. Patient agrees to proceed. -Consent form reviewed and signed. -Proceed with flexor tenotomy as below  Procedure: Flexor Tenotomy Indication for Procedure: toe with semi-reducible hammertoe with distal tip ulceration.  Flexor tenotomy indicated to alleviate contracture, reduce pressure, and enhance healing of the ulceration. Location:  Anesthesia: Lidocaine 1% plain; 1.5 mL and Marcaine 0.5% plain; 1.5 mL digital block Instrumentation: 18 g needle  Technique: The toe was anesthetized as above and prepped in the usual fashion. The toe was exsanquinated and a tourniquet was secured at the base of the toe. An 18g needle was then used to percutaneously release the flexor tendon at the plantar surface of the toe with noted release of the hammertoe deformity. The incision was then dressed with antibiotic ointment and band-aid. Compression splint dressing applied. Patient tolerated the procedure well. Dressing: Dry, sterile, compression dressing. Disposition: Patient tolerated procedure well.    Return in about 1 month (around 10/10/2020).

## 2020-09-12 DIAGNOSIS — Z96653 Presence of artificial knee joint, bilateral: Secondary | ICD-10-CM | POA: Diagnosis not present

## 2020-09-17 DIAGNOSIS — M9902 Segmental and somatic dysfunction of thoracic region: Secondary | ICD-10-CM | POA: Diagnosis not present

## 2020-09-17 DIAGNOSIS — M9901 Segmental and somatic dysfunction of cervical region: Secondary | ICD-10-CM | POA: Diagnosis not present

## 2020-09-17 DIAGNOSIS — R519 Headache, unspecified: Secondary | ICD-10-CM | POA: Diagnosis not present

## 2020-09-17 DIAGNOSIS — M5033 Other cervical disc degeneration, cervicothoracic region: Secondary | ICD-10-CM | POA: Diagnosis not present

## 2020-09-18 DIAGNOSIS — M25512 Pain in left shoulder: Secondary | ICD-10-CM | POA: Diagnosis not present

## 2020-09-18 DIAGNOSIS — M67912 Unspecified disorder of synovium and tendon, left shoulder: Secondary | ICD-10-CM | POA: Diagnosis not present

## 2020-09-26 ENCOUNTER — Other Ambulatory Visit: Payer: Self-pay | Admitting: Internal Medicine

## 2020-10-01 DIAGNOSIS — M9901 Segmental and somatic dysfunction of cervical region: Secondary | ICD-10-CM | POA: Diagnosis not present

## 2020-10-01 DIAGNOSIS — M5033 Other cervical disc degeneration, cervicothoracic region: Secondary | ICD-10-CM | POA: Diagnosis not present

## 2020-10-01 DIAGNOSIS — R519 Headache, unspecified: Secondary | ICD-10-CM | POA: Diagnosis not present

## 2020-10-01 DIAGNOSIS — M9902 Segmental and somatic dysfunction of thoracic region: Secondary | ICD-10-CM | POA: Diagnosis not present

## 2020-10-09 ENCOUNTER — Other Ambulatory Visit: Payer: Self-pay

## 2020-10-09 ENCOUNTER — Encounter: Payer: Self-pay | Admitting: Podiatry

## 2020-10-09 ENCOUNTER — Ambulatory Visit: Payer: PPO | Admitting: Podiatry

## 2020-10-09 DIAGNOSIS — M2042 Other hammer toe(s) (acquired), left foot: Secondary | ICD-10-CM

## 2020-10-09 NOTE — Progress Notes (Signed)
  Subjective:  Patient ID: Jacqueline Mata, female    DOB: 1940/12/03,  MRN: [id number]  Chief Complaint  Patient presents with  . Routine Post Op    S/p tenotomy right 3rd toe:  "its doing great. I am more steady on my feet now and I would like for him to do the other toes"    80 y.o. female returns with the above complaint. History confirmed with patient.  Doing very well.  The right third toe has improved significantly.  She would like the same procedure done on the left foot  Objective:  Physical Exam: warm, good capillary refill, no trophic changes or ulcerative lesions, normal DP and PT pulses and normal sensory exam.    Right Foot: Third toe contracture has improved significantly with no distal tip callus  Left foot: She has similar similar reducible hammertoe contractures 2 through 5 and adductovarus component of the fourth and fifth  X-rays of both feet: Adductovarus hammertoe contractures 2 through 5 bilaterally Assessment:   No diagnosis found.   Plan:  Patient was evaluated and treated and all questions answered.  Hammertoe left foot second third fourth and fifth toes -Discussed proceeding with flexor tenotomy procedure. Patient agrees to proceed. -Consent form reviewed and signed. -Proceed with flexor tenotomy as below  Procedure: Flexor Tenotomy Indication for Procedure: toes with semi-reducible hammertoe. Flexor tenotomy indicated to alleviate contracture, reduce pressure, and improve ambulation Location: Left foot second, third, fourth, fifth toes Anesthesia: Lidocaine 1% plain; 1.5 mL and Marcaine 0.5% plain; 1.5 mL digital block Instrumentation: 18 g needle  Technique: The toe was anesthetized as above and prepped in the usual fashion. The toe was exsanquinated and a tourniquet was secured at the base of the toe. An 18g needle was then used to percutaneously release the flexor tendon at the plantar surface of the toe with noted release of the hammertoe  deformity. The incision was then dressed with antibiotic ointment and band-aid. Compression splint dressing applied. Patient tolerated the procedure well. Dressing: Dry, sterile, compression dressing. Disposition: Patient tolerated procedure well.    Return in about 3 weeks (around 10/30/2020).

## 2020-10-09 NOTE — Patient Instructions (Signed)
Keep the dressings on until Saturday

## 2020-10-16 ENCOUNTER — Encounter: Payer: PPO | Admitting: Podiatry

## 2020-10-16 DIAGNOSIS — M9902 Segmental and somatic dysfunction of thoracic region: Secondary | ICD-10-CM | POA: Diagnosis not present

## 2020-10-16 DIAGNOSIS — R519 Headache, unspecified: Secondary | ICD-10-CM | POA: Diagnosis not present

## 2020-10-16 DIAGNOSIS — M5033 Other cervical disc degeneration, cervicothoracic region: Secondary | ICD-10-CM | POA: Diagnosis not present

## 2020-10-16 DIAGNOSIS — M9901 Segmental and somatic dysfunction of cervical region: Secondary | ICD-10-CM | POA: Diagnosis not present

## 2020-10-18 ENCOUNTER — Other Ambulatory Visit: Payer: Self-pay | Admitting: Orthopaedic Surgery

## 2020-10-18 DIAGNOSIS — Z01818 Encounter for other preprocedural examination: Secondary | ICD-10-CM

## 2020-10-29 ENCOUNTER — Telehealth: Payer: Self-pay | Admitting: Internal Medicine

## 2020-10-29 DIAGNOSIS — M5033 Other cervical disc degeneration, cervicothoracic region: Secondary | ICD-10-CM | POA: Diagnosis not present

## 2020-10-29 DIAGNOSIS — M9901 Segmental and somatic dysfunction of cervical region: Secondary | ICD-10-CM | POA: Diagnosis not present

## 2020-10-29 DIAGNOSIS — R519 Headache, unspecified: Secondary | ICD-10-CM | POA: Diagnosis not present

## 2020-10-29 DIAGNOSIS — M9902 Segmental and somatic dysfunction of thoracic region: Secondary | ICD-10-CM | POA: Diagnosis not present

## 2020-10-29 NOTE — Telephone Encounter (Signed)
Please confirm that patient will have her a1c and other labs done prior to her preop eval with me on feb 7

## 2020-10-29 NOTE — Telephone Encounter (Signed)
Phone continued to ring and could not leave voice message. Patient needs to schedule for Labs before 11/04/2020

## 2020-10-29 NOTE — Telephone Encounter (Signed)
Called Patient Cell phone listed in patients chart. Left a voicemail to call back and schedule for labs. Labs need to be scheduled before 11/04/2020.

## 2020-10-29 NOTE — Telephone Encounter (Signed)
Jacqueline Mata returned the phone call. She scheduled for Fasting labs on 10/31/2020 at 8:45.

## 2020-10-30 ENCOUNTER — Other Ambulatory Visit: Payer: Self-pay

## 2020-10-30 ENCOUNTER — Encounter: Payer: Self-pay | Admitting: Podiatry

## 2020-10-30 ENCOUNTER — Ambulatory Visit: Payer: PPO | Admitting: Podiatry

## 2020-10-30 DIAGNOSIS — Z9889 Other specified postprocedural states: Secondary | ICD-10-CM

## 2020-10-30 DIAGNOSIS — E119 Type 2 diabetes mellitus without complications: Secondary | ICD-10-CM

## 2020-10-30 DIAGNOSIS — M2042 Other hammer toe(s) (acquired), left foot: Secondary | ICD-10-CM

## 2020-10-30 DIAGNOSIS — M2041 Other hammer toe(s) (acquired), right foot: Secondary | ICD-10-CM

## 2020-10-30 DIAGNOSIS — L84 Corns and callosities: Secondary | ICD-10-CM

## 2020-10-31 ENCOUNTER — Other Ambulatory Visit (INDEPENDENT_AMBULATORY_CARE_PROVIDER_SITE_OTHER): Payer: PPO

## 2020-10-31 DIAGNOSIS — E039 Hypothyroidism, unspecified: Secondary | ICD-10-CM | POA: Diagnosis not present

## 2020-10-31 DIAGNOSIS — E119 Type 2 diabetes mellitus without complications: Secondary | ICD-10-CM | POA: Diagnosis not present

## 2020-10-31 DIAGNOSIS — E782 Mixed hyperlipidemia: Secondary | ICD-10-CM | POA: Diagnosis not present

## 2020-10-31 LAB — LIPID PANEL
Cholesterol: 257 mg/dL — ABNORMAL HIGH (ref 0–200)
HDL: 80.3 mg/dL (ref >39.00)
LDL Cholesterol: 157 mg/dL — ABNORMAL HIGH (ref 0–99)
NonHDL: 177.04
Total CHOL/HDL Ratio: 3
Triglycerides: 101 mg/dL (ref 0.0–149.0)
VLDL: 20.2 mg/dL (ref 0.0–40.0)

## 2020-10-31 LAB — COMPREHENSIVE METABOLIC PANEL
ALT: 24 U/L (ref 0–35)
AST: 20 U/L (ref 0–37)
Albumin: 4.5 g/dL (ref 3.5–5.2)
Alkaline Phosphatase: 59 U/L (ref 39–117)
BUN: 17 mg/dL (ref 6–23)
CO2: 25 mEq/L (ref 19–32)
Calcium: 9.8 mg/dL (ref 8.4–10.5)
Chloride: 103 mEq/L (ref 96–112)
Creatinine, Ser: 0.97 mg/dL (ref 0.40–1.20)
GFR: 55.84 mL/min — ABNORMAL LOW (ref >60.00)
Glucose, Bld: 134 mg/dL — ABNORMAL HIGH (ref 70–99)
Potassium: 4.3 mEq/L (ref 3.5–5.1)
Sodium: 137 mEq/L (ref 135–145)
Total Bilirubin: 1 mg/dL (ref 0.2–1.2)
Total Protein: 7.1 g/dL (ref 6.0–8.3)

## 2020-10-31 LAB — HEMOGLOBIN A1C: Hgb A1c MFr Bld: 6.5 % (ref 4.6–6.5)

## 2020-10-31 LAB — TSH: TSH: 2.59 u[IU]/mL (ref 0.35–4.50)

## 2020-11-01 ENCOUNTER — Encounter: Payer: Self-pay | Admitting: Podiatry

## 2020-11-01 NOTE — Progress Notes (Signed)
  Subjective:  Patient ID: Jacqueline Mata, female    DOB: [birth year],  MRN: [id number]  Chief Complaint  Patient presents with  . Hammer Toe    "they are feeling better     80 y.o. female returns with the above complaint. History confirmed with patient.  Doing very well.  Minimal pain.  She has concerns at the fifth toe still turns in when she stands up.  She also has difficulty cutting her toenails and requests 1 to be cut today.  Objective:  Physical Exam: warm, good capillary refill, no trophic changes or ulcerative lesions, normal DP and PT pulses and normal sensory exam.  Dystrophic toenails present with elongated nail plates    Right Foot: Third toe contracture has improved significantly with no distal tip callus  Left foot: Toes in much better position with reduction of hammertoe contractures, adductovarus component of fifth toe still present  X-rays of both feet: Adductovarus hammertoe contractures 2 through 5 bilaterally Assessment:   1. Hammertoe of left foot   2. Hammertoe of right foot   3. Callus of foot   4. Diabetes mellitus type 2, diet-controlled ([place])   5. Post-operative state      Plan:  Patient was evaluated and treated and all questions answered.  Hammertoe left foot second third fourth and fifth toes -Healing well in position is good -She is aware that the fifth toe will not fully straighten.  We discussed the possibility of arthroplasty in the future if this is bothersome.  She understands and we will revisit this in the future if it is an issue   All symptomatic hyperkeratoses were safely debrided with a sterile #15 blade to patient's level of comfort without incident. We discussed preventative and palliative care of these lesions including supportive and accommodative shoegear, padding, prefabricated and custom molded accommodative orthoses, use of a pumice stone and lotions/creams daily.  Debrided dystrophic toenails x7 at her request  today.  Return in about 3 months (around 01/27/2021).

## 2020-11-04 ENCOUNTER — Other Ambulatory Visit: Payer: Self-pay

## 2020-11-04 ENCOUNTER — Ambulatory Visit (INDEPENDENT_AMBULATORY_CARE_PROVIDER_SITE_OTHER): Payer: PPO | Admitting: Internal Medicine

## 2020-11-04 ENCOUNTER — Encounter: Payer: Self-pay | Admitting: Internal Medicine

## 2020-11-04 DIAGNOSIS — E119 Type 2 diabetes mellitus without complications: Secondary | ICD-10-CM | POA: Diagnosis not present

## 2020-11-04 DIAGNOSIS — Z01818 Encounter for other preprocedural examination: Secondary | ICD-10-CM | POA: Diagnosis not present

## 2020-11-04 NOTE — Progress Notes (Signed)
DUE TO COVID-19 ONLY ONE VISITOR IS ALLOWED TO COME WITH YOU AND STAY IN THE WAITING ROOM ONLY DURING PRE OP AND PROCEDURE DAY OF SURGERY. THE 1 VISITOR  MAY VISIT WITH YOU AFTER SURGERY IN YOUR PRIVATE ROOM DURING VISITING HOURS ONLY!  YOU NEED TO HAVE A COVID 19 TEST ON__2/07/2021 _____ @_______ , THIS TEST MUST BE DONE BEFORE SURGERY,  COVID TESTING SITE [address] [place] [address], IT IS ON THE RIGHT GOING OUT [address] APPROXIMATELY  2 MINUTES PAST [employer] ON THE RIGHT. ONCE YOUR COVID TEST IS COMPLETED,  PLEASE BEGIN THE QUARANTINE INSTRUCTIONS AS OUTLINED IN YOUR HANDOUT.                Jacqueline Mata  11/04/2020   Your procedure is scheduled on: 11/12/2020    Report to [facility] Main  Entrance   Report to admitting at      1030 AM     Call this number if you have problems the morning of surgery [phone]    REMEMBER: NO  SOLID FOOD CANDY OR GUM AFTER MIDNIGHT. CLEAR LIQUIDS UNTIL 0930am         . NOTHING BY MOUTH EXCEPT CLEAR LIQUIDS UNTIL    . PLEASE FINISH ENSURE DRINK PER SURGEON ORDER  WHICH NEEDS TO BE COMPLETED AT   0930am   .      CLEAR LIQUID DIET   Foods Allowed                                                                    Coffee and tea, regular and decaf                            Fruit ices (not with fruit pulp)                                      Iced Popsicles                                    Carbonated beverages, regular and diet                                    Cranberry, grape and apple juices Sports drinks like Gatorade Lightly seasoned clear broth or consume(fat free) Sugar, honey syrup ___________________________________________________________________      BRUSH YOUR TEETH MORNING OF SURGERY AND RINSE YOUR MOUTH OUT, NO CHEWING GUM CANDY OR MINTS.     Take these medicines the morning of surgery with A SIP OF WATER: synthroid, claritin   DO NOT TAKE ANY DIABETIC MEDICATIONS DAY OF YOUR  SURGERY                               You may not have any metal on your body including hair pins and              piercings  Do not wear jewelry, make-up, lotions, powders or perfumes, deodorant             Do not wear nail polish on your fingernails.  Do not shave  48 hours prior to surgery.              Men may shave face and neck.   Do not bring valuables to the hospital. [place].  Contacts, dentures or bridgework may not be worn into surgery.  Leave suitcase in the car. After surgery it may be brought to your room.     Patients discharged the day of surgery will not be allowed to drive home. IF YOU ARE HAVING SURGERY AND GOING HOME THE SAME DAY, YOU MUST HAVE AN ADULT TO DRIVE YOU HOME AND BE WITH YOU FOR 24 HOURS. YOU MAY GO HOME BY TAXI OR UBER OR ORTHERWISE, BUT AN ADULT MUST ACCOMPANY YOU HOME AND STAY WITH YOU FOR 24 HOURS.  Name and phone number of your driver:  Special Instructions: N/A              Please read over the following fact sheets you were given: _____________________________________________________________________  [facility] - Preparing for Surgery Before surgery, you can play an important role.  Because skin is not sterile, your skin needs to be as free of germs as possible.  You can reduce the number of germs on your skin by washing with CHG (chlorahexidine gluconate) soap before surgery.  CHG is an antiseptic cleaner which kills germs and bonds with the skin to continue killing germs even after washing. Please DO NOT use if you have an allergy to CHG or antibacterial soaps.  If your skin becomes reddened/irritated stop using the CHG and inform your nurse when you arrive at Short Stay. Do not shave (including legs and underarms) for at least 48 hours prior to the first CHG shower.  You may shave your face/neck. Please follow these instructions carefully:  1.  Shower with CHG Soap the night before surgery and the   morning of Surgery.  2.  If you choose to wash your hair, wash your hair first as usual with your  normal  shampoo.  3.  After you shampoo, rinse your hair and body thoroughly to remove the  shampoo.                           4.  Use CHG as you would any other liquid soap.  You can apply chg directly  to the skin and wash                       Gently with a scrungie or clean washcloth.  5.  Apply the CHG Soap to your body ONLY FROM THE NECK DOWN.   Do not use on face/ open                           Wound or open sores. Avoid contact with eyes, ears mouth and genitals (private parts).                       Wash face,  Genitals (private parts) with your normal soap.             6.  Wash  thoroughly, paying special attention to the area where your surgery  will be performed.  7.  Thoroughly rinse your body with warm water from the neck down.  8.  DO NOT shower/wash with your normal soap after using and rinsing off  the CHG Soap.                9.  Pat yourself dry with a clean towel.            10.  Wear clean pajamas.            11.  Place clean sheets on your bed the night of your first shower and do not  sleep with pets. Day of Surgery : Do not apply any lotions/deodorants the morning of surgery.  Please wear clean clothes to the hospital/surgery center.  FAILURE TO FOLLOW THESE INSTRUCTIONS MAY RESULT IN THE CANCELLATION OF YOUR SURGERY PATIENT SIGNATURE_________________________________  NURSE SIGNATURE__________________________________  ________________________________________________________________________

## 2020-11-04 NOTE — Progress Notes (Signed)
Subjective:  Patient ID: Jacqueline Mata, female    DOB: 11/03/1940  Age: 80 y.o. MRN: [id number]  CC: Diagnoses of Preoperative evaluation to rule out surgical contraindication and Diabetes mellitus type 2, diet-controlled ([place]) were pertinent to this visit.  HPI [name] presents for preoperative evaluation  This visit occurred during the SARS-CoV-2 public health emergency.  Safety protocols were in place, including screening questions prior to the visit, additional usage of staff PPE, and extensive cleaning of exam room while observing appropriate contact time as indicated for disinfecting solutions.   [name] is here for  preoperative medical clearance, requested by her orthopedist,  Dr Novella Olive for total right hip replacement  On Tuesday Feb 15 , to be done under spinal anesthesia  From an anterior approach .  She has no history of CAD. She has DM Type 2 which is well controlled  and not complicated by nephropathy , neuropathy , or PAD.    She denies any recent or previous episodes of chest pain, orthopnea, or shortness of breath.  Has been isolating at home due to Jacqueline Mata .  She has been in considerable discomfort for the past 2 weeks and is now using a walker to ambulate.   Lab Results  Component Value Date   HGBA1C 6.5 10/31/2020       Outpatient Medications Prior to Visit  Medication Sig Dispense Refill  . acetaminophen (TYLENOL) 500 MG tablet Take 1,000 mg by mouth every 6 (six) hours as needed for moderate pain.    Marland Kitchen amoxicillin (AMOXIL) 500 MG capsule Take 2,000 mg by mouth See admin instructions. Dental procedures. Takes $RemoveBeforeDEI'2000mg'[id number]$  one hour prior to dental procedures.    . Blood Glucose Monitoring Suppl KIT by Does not apply route.    . Cholecalciferol (VITAMIN D) 50 MCG (2000 UT) tablet Take 2,000 Units by mouth daily.    . folic acid (FOLVITE) 510 MCG tablet Take 800 mcg by mouth daily.    Marland Kitchen glucose blood (ACCU-CHEK AVIVA PLUS) test strip Use to check blood sugar once  daily. 100 each 5  . Lancets (ACCU-CHEK MULTICLIX) lancets Use once daily to check blood sugars   Dx:E11.9 100 each 3  . levothyroxine (SYNTHROID) 88 MCG tablet TAKE 1 TABLET BY MOUTH DAILY BEFORE BREAKFAST (Patient taking differently: Take 88 mcg by mouth daily before breakfast. TAKE 1 TABLET BY MOUTH DAILY BEFORE BREAKFAST) 90 tablet 0  . loratadine (CLARITIN) 10 MG tablet Take 10 mg by mouth daily.    . meloxicam (MOBIC) 15 MG tablet Take 15 mg by mouth daily.    . metronidazole (NORITATE) 1 % cream Apply 1 application topically in the morning and at bedtime.    . Probiotic Product (PROBIOTIC DAILY PO) Take 1 capsule by mouth daily.    . metFORMIN (GLUCOPHAGE-XR) 500 MG 24 hr tablet Take 1 tablet (500 mg total) by mouth daily with breakfast. (Patient not taking: No sig reported) 90 tablet 1   No facility-administered medications prior to visit.    Review of Systems;  Patient denies headache, fevers, malaise, unintentional weight loss, skin rash, eye pain, sinus congestion and sinus pain, sore throat, dysphagia,  hemoptysis , cough, dyspnea, wheezing, chest pain, palpitations, orthopnea, edema, abdominal pain, nausea, melena, diarrhea, constipation, flank pain, dysuria, hematuria, urinary  Frequency, nocturia, numbness, tingling, seizures,  Focal weakness, Loss of consciousness,  Tremor, insomnia, depression, anxiety, and suicidal ideation.      Objective:  BP 134/82 (BP Location: Left Arm, Patient Position: Sitting)  Pulse 86   Temp 97.7 F (36.5 C)   Ht 5' 4.02" (1.626 m)   Wt 175 lb 12.8 oz (79.7 kg)   SpO2 95%   BMI 30.16 kg/m   BP Readings from Last 3 Encounters:  11/04/20 134/82  08/29/20 118/82  07/18/20 120/88    Wt Readings from Last 3 Encounters:  11/04/20 175 lb 12.8 oz (79.7 kg)  08/29/20 178 lb (80.7 kg)  07/18/20 180 lb (81.6 kg)    General appearance: alert, cooperative and appears stated age Ears: normal TM's and external ear canals both ears Throat:  lips, mucosa, and tongue normal; teeth and gums normal Neck: no adenopathy, no carotid bruit, supple, symmetrical, trachea midline and thyroid not enlarged, symmetric, no tenderness/mass/nodules Back: symmetric, no curvature. ROM normal. No CVA tenderness. Lungs: clear to auscultation bilaterally Heart: regular rate and rhythm, S1, S2 normal, no murmur, click, rub or gallop Abdomen: soft, non-tender; bowel sounds normal; no masses,  no organomegaly Pulses: 2+ and symmetric Skin: Skin color, texture, turgor normal. No rashes or lesions Lymph nodes: Cervical, supraclavicular, and axillary nodes normal.  Lab Results  Component Value Date   HGBA1C 6.5 10/31/2020   HGBA1C 7.5 (H) 07/19/2020   HGBA1C 7.2 (H) 04/15/2020    Lab Results  Component Value Date   CREATININE 0.97 10/31/2020   CREATININE 1.04 07/19/2020   CREATININE 0.99 07/09/2020    Lab Results  Component Value Date   WBC 7.6 06/20/2020   HGB 14.2 06/20/2020   HCT 41.9 06/20/2020   PLT 169 06/20/2020   GLUCOSE 134 (H) 10/31/2020   CHOL 257 (H) 10/31/2020   TRIG 101.0 10/31/2020   HDL 80.30 10/31/2020   LDLDIRECT 122.0 07/01/2017   LDLCALC 157 (H) 10/31/2020   ALT 24 10/31/2020   AST 20 10/31/2020   NA 137 10/31/2020   K 4.3 10/31/2020   CL 103 10/31/2020   CREATININE 0.97 10/31/2020   BUN 17 10/31/2020   CO2 25 10/31/2020   TSH 2.59 10/31/2020   INR 1.0 03/14/2012   HGBA1C 6.5 10/31/2020   MICROALBUR <0.7 04/15/2020    MM 3D SCREEN BREAST BILATERAL  Result Date: 05/08/2020 CLINICAL DATA:  Screening. EXAM: DIGITAL SCREENING BILATERAL MAMMOGRAM WITH TOMO AND CAD COMPARISON:  Previous exam(s). ACR Breast Density Category b: There are scattered areas of fibroglandular density. FINDINGS: There are no findings suspicious for malignancy. Images were processed with CAD. IMPRESSION: No mammographic evidence of malignancy. A result letter of this screening mammogram will be mailed directly to the patient.  RECOMMENDATION: Screening mammogram in one year. (Code:SM-B-01Y) BI-RADS CATEGORY  1: Negative. Electronically Signed   By: Ammie Ferrier M.D.   On: 05/08/2020 12:53    Assessment & Plan:   Problem List Items Addressed This Visit      Unprioritized   Diabetes mellitus type 2, diet-controlled ([place])    Continues to have excellent control with diet alone.   Patient is up-to-date on eye exams and foot exam ws normal in October.  Patient has no history of  microalbuminuria . Patient is intolerant of statin therapy for CAD risk reduction.  Lab Results  Component Value Date   HGBA1C 6.5 10/31/2020   Lab Results  Component Value Date   MICROALBUR <0.7 04/15/2020           Preoperative evaluation to rule out surgical contraindication    Patient  is considered to be at low risk  For perioperative complications  Based on today's exam and history.  Baseline  lytes,  hgb and ekg done.  She has no history of heart disease and is in sinus rhythm based on my exam.  Her diabetes is well controlled on metformin alone and she has no history of hypertension or CAD         I spent 30 minutes dedicated to the care of this patient on the date of this encounter to include pre-visit review of his medical history,  Face-to-face time with the patient , and post visit ordering of testing and therapeutics.  I have discontinued Grayland Ormond. Rohrman's metFORMIN. I am also having her maintain her Blood Glucose Monitoring Suppl, accu-chek multiclix, glucose blood, folic acid, amoxicillin, meloxicam, loratadine, acetaminophen, Probiotic Product (PROBIOTIC DAILY PO), levothyroxine, metronidazole, and Vitamin D.  No orders of the defined types were placed in this encounter.   Medications Discontinued During This Encounter  Medication Reason  . metFORMIN (GLUCOPHAGE-XR) 500 MG 24 hr tablet Error    Follow-up: No follow-ups on file.   [name], MD

## 2020-11-04 NOTE — Assessment & Plan Note (Signed)
Continues to have excellent control with diet alone.   Patient is up-to-date on eye exams and foot exam ws normal in October.  Patient has no history of  microalbuminuria . Patient is intolerant of statin therapy for CAD risk reduction.  Lab Results  Component Value Date   HGBA1C 6.5 10/31/2020   Lab Results  Component Value Date   MICROALBUR <0.7 04/15/2020

## 2020-11-04 NOTE — Assessment & Plan Note (Signed)
Patient  is considered to be at low risk  For perioperative complications  Based on today's exam and history.  Baseline lytes,  hgb and ekg done.  She has no history of heart disease and is in sinus rhythm based on my exam.  Her diabetes is well controlled on metformin alone and she has no history of hypertension or CAD

## 2020-11-05 ENCOUNTER — Ambulatory Visit (HOSPITAL_COMMUNITY)
Admission: RE | Admit: 2020-11-05 | Discharge: 2020-11-05 | Disposition: A | Discharge status: Home / Self Care | Payer: PPO | Source: Ambulatory Visit | Attending: Orthopaedic Surgery | Admitting: Orthopaedic Surgery

## 2020-11-05 ENCOUNTER — Other Ambulatory Visit: Payer: Self-pay

## 2020-11-05 ENCOUNTER — Encounter (HOSPITAL_COMMUNITY): Payer: Self-pay

## 2020-11-05 ENCOUNTER — Encounter (HOSPITAL_COMMUNITY)
Admission: RE | Admit: 2020-11-05 | Discharge: 2020-11-05 | Disposition: A | Discharge status: Home / Self Care | Payer: PPO | Source: Ambulatory Visit | Attending: Orthopaedic Surgery | Admitting: Orthopaedic Surgery

## 2020-11-05 DIAGNOSIS — Z01818 Encounter for other preprocedural examination: Secondary | ICD-10-CM

## 2020-11-05 HISTORY — DX: Hypothyroidism, unspecified: E03.9

## 2020-11-05 HISTORY — DX: Anxiety disorder, unspecified: F41.9

## 2020-11-05 HISTORY — DX: Prediabetes: R73.03

## 2020-11-05 HISTORY — DX: Other complications of anesthesia, initial encounter: T88.59XA

## 2020-11-05 HISTORY — DX: Nausea with vomiting, unspecified: R11.2

## 2020-11-05 HISTORY — DX: Unspecified osteoarthritis, unspecified site: M19.90

## 2020-11-05 HISTORY — DX: Nausea with vomiting, unspecified: Z98.890

## 2020-11-05 LAB — URINALYSIS, ROUTINE W REFLEX MICROSCOPIC
Bilirubin Urine: NEGATIVE
Glucose, UA: NEGATIVE mg/dL
Hgb urine dipstick: NEGATIVE
Ketones, ur: NEGATIVE mg/dL
Leukocytes,Ua: NEGATIVE
Nitrite: NEGATIVE
Protein, ur: NEGATIVE mg/dL
Specific Gravity, Urine: 1.006 (ref 1.005–1.030)
pH: 5 (ref 5.0–8.0)

## 2020-11-05 LAB — BASIC METABOLIC PANEL
Anion gap: 11 (ref 5–15)
BUN: 25 mg/dL — ABNORMAL HIGH (ref 8–23)
CO2: 22 mmol/L (ref 22–32)
Calcium: 10.2 mg/dL (ref 8.9–10.3)
Chloride: 105 mmol/L (ref 98–111)
Creatinine, Ser: 0.97 mg/dL (ref 0.44–1.00)
GFR, Estimated: 60 mL/min — ABNORMAL LOW (ref >60)
Glucose, Bld: 95 mg/dL (ref 70–99)
Potassium: 4.6 mmol/L (ref 3.5–5.1)
Sodium: 138 mmol/L (ref 135–145)

## 2020-11-05 LAB — CBC WITH DIFFERENTIAL/PLATELET
Abs Immature Granulocytes: 0.03 10*3/uL (ref 0.00–0.07)
Basophils Absolute: 0.1 10*3/uL (ref 0.0–0.1)
Basophils Relative: 1 %
Eosinophils Absolute: 0.3 10*3/uL (ref 0.0–0.5)
Eosinophils Relative: 3 %
HCT: 45 % (ref 36.0–46.0)
Hemoglobin: 15 g/dL (ref 12.0–15.0)
Immature Granulocytes: 0 %
Lymphocytes Relative: 26 %
Lymphs Abs: 2.6 10*3/uL (ref 0.7–4.0)
MCH: 31.5 pg (ref 26.0–34.0)
MCHC: 33.3 g/dL (ref 30.0–36.0)
MCV: 94.5 fL (ref 80.0–100.0)
Monocytes Absolute: 0.6 10*3/uL (ref 0.1–1.0)
Monocytes Relative: 6 %
Neutro Abs: 6.2 10*3/uL (ref 1.7–7.7)
Neutrophils Relative %: 64 %
Platelets: 205 10*3/uL (ref 150–400)
RBC: 4.76 MIL/uL (ref 3.87–5.11)
RDW: 12.9 % (ref 11.5–15.5)
WBC: 9.9 10*3/uL (ref 4.0–10.5)
nRBC: 0 % (ref 0.0–0.2)

## 2020-11-05 LAB — PROTIME-INR
INR: 1.1 (ref 0.8–1.2)
Prothrombin Time: 13.9 seconds (ref 11.4–15.2)

## 2020-11-05 LAB — GLUCOSE, CAPILLARY: Glucose-Capillary: 90 mg/dL (ref 70–99)

## 2020-11-05 LAB — APTT: aPTT: 36 seconds (ref 24–36)

## 2020-11-05 LAB — SURGICAL PCR SCREEN
MRSA, PCR: NEGATIVE
Staphylococcus aureus: NEGATIVE

## 2020-11-05 NOTE — Progress Notes (Signed)
Anesthesia Review:  PCP: DR Deborra Medina  :Saw for clearance on 11/04/2020  Cardiologist : Chest x-ray :11/05/20  EKG : 11/05/20 Echo : Stress test: Cardiac Cath :  Activity level: can do a flight of stairs without difficulty  Sleep Study/ CPAP : Fasting Blood Sugar :      / Checks Blood Sugar -- times a day:   Blood Thinner/ Instructions /Last Dose: ASA / Instructions/ Last Dose :  Prediabetes per pt  hgba1c-10/31/20-6.5

## 2020-11-06 DIAGNOSIS — M5416 Radiculopathy, lumbar region: Secondary | ICD-10-CM | POA: Diagnosis not present

## 2020-11-06 DIAGNOSIS — M9903 Segmental and somatic dysfunction of lumbar region: Secondary | ICD-10-CM | POA: Diagnosis not present

## 2020-11-06 DIAGNOSIS — M62830 Muscle spasm of back: Secondary | ICD-10-CM | POA: Diagnosis not present

## 2020-11-06 DIAGNOSIS — M9901 Segmental and somatic dysfunction of cervical region: Secondary | ICD-10-CM | POA: Diagnosis not present

## 2020-11-06 NOTE — Anesthesia Preprocedure Evaluation (Addendum)
Anesthesia Evaluation  Patient identified by MRN, date of birth, ID band Patient awake    Reviewed: Allergy & Precautions, NPO status , Patient's Chart, lab work & pertinent test results  History of Anesthesia Complications (+) PONV and history of anesthetic complications  Airway Mallampati: III  TM Distance: >3 FB Neck ROM: Full    Dental no notable dental hx.    Pulmonary former smoker,    Pulmonary exam normal breath sounds clear to auscultation       Cardiovascular negative cardio ROS Normal cardiovascular exam Rhythm:Regular Rate:Normal  ECG: NSR, rate 82   Neuro/Psych Anxiety  Neuromuscular disease    GI/Hepatic negative GI ROS, Neg liver ROS,   Endo/Other  diabetesHypothyroidism   Renal/GU negative Renal ROS     Musculoskeletal  (+) Arthritis , Bilateral breast cancer s/p surgery, chemo, and radiation   Abdominal (+) + obese,   Peds  Hematology HLD   Anesthesia Other Findings  RIGHT HIP DEGENERATIVE JOINT DISEASE  Reproductive/Obstetrics                            Anesthesia Physical Anesthesia Plan  ASA: II  Anesthesia Plan: Spinal   Post-op Pain Management:    Induction: Intravenous  PONV Risk Score and Plan: 3 and Ondansetron, Dexamethasone, Treatment may vary due to age or medical condition and Propofol infusion  Airway Management Planned: Simple Face Mask  Additional Equipment:   Intra-op Plan:   Post-operative Plan:   Informed Consent: I have reviewed the patients History and Physical, chart, labs and discussed the procedure including the risks, benefits and alternatives for the proposed anesthesia with the patient or authorized representative who has indicated his/her understanding and acceptance.     Dental advisory given  Plan Discussed with: CRNA and Surgeon  Anesthesia Plan Comments: (Right external jugular vein IV Potential arterial line placement  discussed  Per PCP notes 11/04/2020, "Patient  is considered to be at low risk  For perioperative complications  Based on today's exam and history.  Baseline lytes,  hgb and ekg done.  She has no history of heart disease and is in sinus rhythm based on my exam.  Her diabetes is well controlled on metformin alone and she has no history of hypertension or CAD")     Anesthesia Quick Evaluation

## 2020-11-08 ENCOUNTER — Other Ambulatory Visit (HOSPITAL_COMMUNITY)
Admission: RE | Admit: 2020-11-08 | Discharge: 2020-11-08 | Disposition: A | Discharge status: Home / Self Care | Payer: PPO | Source: Ambulatory Visit | Attending: Orthopaedic Surgery | Admitting: Orthopaedic Surgery

## 2020-11-08 ENCOUNTER — Other Ambulatory Visit (HOSPITAL_COMMUNITY): Payer: PPO

## 2020-11-08 DIAGNOSIS — M9903 Segmental and somatic dysfunction of lumbar region: Secondary | ICD-10-CM | POA: Diagnosis not present

## 2020-11-08 DIAGNOSIS — M5416 Radiculopathy, lumbar region: Secondary | ICD-10-CM | POA: Diagnosis not present

## 2020-11-08 DIAGNOSIS — Z20822 Contact with and (suspected) exposure to covid-19: Secondary | ICD-10-CM | POA: Diagnosis not present

## 2020-11-08 DIAGNOSIS — M9901 Segmental and somatic dysfunction of cervical region: Secondary | ICD-10-CM | POA: Diagnosis not present

## 2020-11-08 DIAGNOSIS — Z01812 Encounter for preprocedural laboratory examination: Secondary | ICD-10-CM | POA: Diagnosis not present

## 2020-11-08 DIAGNOSIS — M62830 Muscle spasm of back: Secondary | ICD-10-CM | POA: Diagnosis not present

## 2020-11-08 LAB — SARS CORONAVIRUS 2 (TAT 6-24 HRS): SARS Coronavirus 2: NEGATIVE

## 2020-11-08 NOTE — H&P (Signed)
TOTAL HIP ADMISSION H&P  Patient is admitted for right total hip arthroplasty.  Subjective:  Chief Complaint: right hip pain  HPI: Jacqueline Mata, 80 y.o. female, has a history of pain and functional disability in the right hip(s) due to arthritis and patient has failed non-surgical conservative treatments for greater than 12 weeks to include NSAID's and/or analgesics, flexibility and strengthening excercises, supervised PT with diminished ADL's post treatment, use of assistive devices, weight reduction as appropriate and activity modification.  Onset of symptoms was gradual starting 5 years ago with gradually worsening course since that time.The patient noted no past surgery on the right hip(s).  Patient currently rates pain in the right hip at 10 out of 10 with activity. Patient has night pain, worsening of pain with activity and weight bearing, trendelenberg gait, pain that interfers with activities of daily living and crepitus. Patient has evidence of subchondral cysts, subchondral sclerosis, periarticular osteophytes and joint space narrowing by imaging studies. This condition presents safety issues increasing the risk of falls. There is no current active infection.  Patient Active Problem List   Diagnosis Date Noted  . Preoperative evaluation to rule out surgical contraindication 11/04/2020  . Arthritis of right hip 07/20/2020  . Pre-ulcerative corn or callous 07/09/2020  . Elevated liver enzymes 04/16/2020  . Short-term memory loss 07/31/2019  . Educated about COVID-19 virus infection 01/30/2019  . Osteopenia determined by x-ray 03/02/2018  . Myalgia due to statin 01/02/2018  . Stage 2 carcinoma of breast, ER+, unspecified laterality ([place]) 04/29/2017  . Drug-induced skin rash 04/24/2016  . Cervical radiculopathy due to degenerative joint disease of spine 03/19/2015  . Acromioclavicular joint arthritis 02/19/2015  . Rotator cuff dysfunction 01/08/2015  . Shoulder pain, bilateral  12/21/2014  . Acquired calf asymmetry 12/21/2014  . Sciatica of left side 10/17/2014  . Encounter for Medicare annual wellness exam 05/22/2014  . Basal cell carcinoma of leg 12/20/2013  . Overweight (BMI 25.0-29.9) 08/08/2013  . Statin intolerance 08/07/2013  . Balance problem due to vestibular dysfunction 05/01/2012  . Status post total knee replacement 05/01/2012  . Breast cancer in situ 11/09/2011  . Acquired hypothyroidism 07/26/2011  . Hyperlipidemia   . Diabetes mellitus type 2, diet-controlled ([place]) 07/23/2011   Past Medical History:  Diagnosis Date  . Anxiety   . Arthritis   . breast cancer 2005   bilateral  . Cataract 01/04/13 and 03/01/13  . Complication of anesthesia    dizziness   . Diabetes mellitus    pt denies at preop of 11/05/2020   . Hyperlipidemia   . hypothyroidism   . Hypothyroidism   . Personal history of chemotherapy   . Personal history of radiation therapy   . PONV (postoperative nausea and vomiting)   . Pre-diabetes     Past Surgical History:  Procedure Laterality Date  . bilateral knee replacement s    . BREAST BIOPSY Left 2005   positive  . BREAST BIOPSY Right 2005   positive  . BREAST LUMPECTOMY Left 2005  . BREAST LUMPECTOMY Right 2005  . gumm surgery     . JOINT REPLACEMENT  2013   by Dr. Marry Guan  . righ tknee arthroscopy     . TONSILLECTOMY      No current facility-administered medications for this encounter.   Current Outpatient Medications  Medication Sig Dispense Refill Last Dose  . acetaminophen (TYLENOL) 500 MG tablet Take 1,000 mg by mouth every 6 (six) hours as needed for moderate pain.     Marland Kitchen  amoxicillin (AMOXIL) 500 MG capsule Take 2,000 mg by mouth See admin instructions. Dental procedures. Takes $RemoveBeforeDEI'2000mg'ttXaNPUaftvmpSjJ$  one hour prior to dental procedures.     . Cholecalciferol (VITAMIN D) 50 MCG (2000 UT) tablet Take 2,000 Units by mouth daily.     . folic acid (FOLVITE) 564 MCG tablet Take 800 mcg by mouth daily.     Marland Kitchen levothyroxine  (SYNTHROID) 88 MCG tablet TAKE 1 TABLET BY MOUTH DAILY BEFORE BREAKFAST (Patient taking differently: Take 88 mcg by mouth daily before breakfast. TAKE 1 TABLET BY MOUTH DAILY BEFORE BREAKFAST) 90 tablet 0   . loratadine (CLARITIN) 10 MG tablet Take 10 mg by mouth daily.     . meloxicam (MOBIC) 15 MG tablet Take 15 mg by mouth daily.     . metronidazole (NORITATE) 1 % cream Apply 1 application topically in the morning and at bedtime.     . Probiotic Product (PROBIOTIC DAILY PO) Take 1 capsule by mouth daily.     . Blood Glucose Monitoring Suppl KIT by Does not apply route.     Marland Kitchen glucose blood (ACCU-CHEK AVIVA PLUS) test strip Use to check blood sugar once daily. 100 each 5   . Lancets (ACCU-CHEK MULTICLIX) lancets Use once daily to check blood sugars   Dx:E11.9 100 each 3    Allergies  Allergen Reactions  . Codeine Shortness Of Breath and Other (See Comments)    Altered mental staus  . Ilevro [Nepafenac] Other (See Comments)    Eye swelling   . Lamisil [Terbinafine] Rash and Other (See Comments)    Dysphagia and skin fungus  . Mold Extract [Trichophyton Mentagrophyte] Other (See Comments)  . Other Swelling    ILLEVRO (EYE DROPS)- EYE SWELLING  DOGS.Congestion. FEATHERS.Congestion  . Maxitrol [Neomycin-Polymyxin-Dexameth]     Unknown - dry eyes   . Metformin And Related     Shakiness   . Molds & Smuts Other (See Comments)    Congestion.  Marland Kitchen Neomycin Rash  . Neomycin-Bacitracin Zn-Polymyx Rash  . Neomycin-Polymyxin-Hc Rash  . Tape Dermatitis    Blisters  . Tapentadol Nausea And Vomiting and Other (See Comments)    Nucynta  . Terbinafine Rash    Trouble swallowing  . Tramadol Other (See Comments), Nausea Only and Nausea And Vomiting    dizziness    Social History   Tobacco Use  . Smoking status: Former Smoker    Packs/day: 1.00    Years: 19.00    Pack years: 19.00    Types: Cigarettes    Start date: 01/02/1960    Quit date: 07/22/1978    Years since quitting: 42.3  .  Smokeless tobacco: Never Used  . Tobacco comment: quit 36 years ago  Substance Use Topics  . Alcohol use: Yes    Alcohol/week: 0.0 standard drinks    Comment: rare    Family History  Problem Relation Age of Onset  . Cancer Sister        lung, former tobacco  . Cancer Brother 55       lung, thyroid, melanoma  . Cancer Mother 53       ovarian  . Diabetes Son   . Breast cancer Maternal Grandfather      Review of Systems  Musculoskeletal: Positive for arthralgias.       Right hip  All other systems reviewed and are negative.   Objective:  Physical Exam HENT:     Head: Normocephalic and atraumatic.     Nose: Nose normal.  Mouth/Throat:     Pharynx: Oropharynx is clear.  Eyes:     Extraocular Movements: Extraocular movements intact.  Cardiovascular:     Rate and Rhythm: Normal rate and regular rhythm.  Pulmonary:     Effort: Pulmonary effort is normal.  Abdominal:     Palpations: Abdomen is soft.  Musculoskeletal:     Cervical back: Normal range of motion.     Comments: Right hip motion is limited and somewhat painful in internal rotation.  Leg lengths look equal.  She is walking with a markedly altered gait.  Sensation and motor function are intact distally with palpable pulses in her feet.  Skin:    General: Skin is warm and dry.  Neurological:     General: No focal deficit present.     Mental Status: She is oriented to person, place, and time.  Psychiatric:        Mood and Affect: Mood normal.        Behavior: Behavior normal.        Thought Content: Thought content normal.        Judgment: Judgment normal.     Vital signs in last 24 hours:    Labs:   Estimated body mass index is 30.16 kg/m as calculated from the following:   Height as of 11/04/20: 5' 4.02" (1.626 m).   Weight as of 11/04/20: 79.7 kg.   Imaging Review Plain radiographs demonstrate severe degenerative joint disease of the right hip(s). The bone quality appears to be good for age and  reported activity level.      Assessment/Plan:  End stage primary arthritis, right hip(s)  The patient history, physical examination, clinical judgement of the provider and imaging studies are consistent with end stage degenerative joint disease of the right hip(s) and total hip arthroplasty is deemed medically necessary. The treatment options including medical management, injection therapy, arthroscopy and arthroplasty were discussed at length. The risks and benefits of total hip arthroplasty were presented and reviewed. The risks due to aseptic loosening, infection, stiffness, dislocation/subluxation,  thromboembolic complications and other imponderables were discussed.  The patient acknowledged the explanation, agreed to proceed with the plan and consent was signed. Patient is being admitted for inpatient treatment for surgery, pain control, PT, OT, prophylactic antibiotics, VTE prophylaxis, progressive ambulation and ADL's and discharge planning.The patient is planning to be discharged home with home health services

## 2020-11-11 MED ORDER — BUPIVACAINE LIPOSOME 1.3 % IJ SUSP
10.0000 mL | INTRAMUSCULAR | Status: DC
  Filled 2020-11-11: qty 10

## 2020-11-11 MED ORDER — TRANEXAMIC ACID 1000 MG/10ML IV SOLN
2000.0000 mg | INTRAVENOUS | Status: DC
  Filled 2020-11-11: qty 20

## 2020-11-12 ENCOUNTER — Encounter (HOSPITAL_COMMUNITY)
Admission: RE | Disposition: A | Discharge status: Home / Self Care | Payer: Self-pay | Source: Home / Self Care | Attending: Orthopaedic Surgery

## 2020-11-12 ENCOUNTER — Ambulatory Visit (HOSPITAL_COMMUNITY): Payer: PPO

## 2020-11-12 ENCOUNTER — Ambulatory Visit (HOSPITAL_COMMUNITY): Payer: PPO | Admitting: Physician Assistant

## 2020-11-12 ENCOUNTER — Ambulatory Visit (HOSPITAL_COMMUNITY)
Admission: RE | Admit: 2020-11-12 | Discharge: 2020-11-12 | Disposition: A | Discharge status: Home / Self Care | Payer: PPO | Attending: Orthopaedic Surgery | Admitting: Orthopaedic Surgery

## 2020-11-12 ENCOUNTER — Ambulatory Visit (HOSPITAL_COMMUNITY): Payer: PPO | Admitting: Anesthesiology

## 2020-11-12 ENCOUNTER — Encounter (HOSPITAL_COMMUNITY): Payer: Self-pay | Admitting: Orthopaedic Surgery

## 2020-11-12 DIAGNOSIS — Z85828 Personal history of other malignant neoplasm of skin: Secondary | ICD-10-CM | POA: Diagnosis not present

## 2020-11-12 DIAGNOSIS — Z96653 Presence of artificial knee joint, bilateral: Secondary | ICD-10-CM | POA: Insufficient documentation

## 2020-11-12 DIAGNOSIS — Z79899 Other long term (current) drug therapy: Secondary | ICD-10-CM | POA: Diagnosis not present

## 2020-11-12 DIAGNOSIS — Z791 Long term (current) use of non-steroidal anti-inflammatories (NSAID): Secondary | ICD-10-CM | POA: Diagnosis not present

## 2020-11-12 DIAGNOSIS — F419 Anxiety disorder, unspecified: Secondary | ICD-10-CM | POA: Diagnosis not present

## 2020-11-12 DIAGNOSIS — Z883 Allergy status to other anti-infective agents status: Secondary | ICD-10-CM | POA: Diagnosis not present

## 2020-11-12 DIAGNOSIS — Z853 Personal history of malignant neoplasm of breast: Secondary | ICD-10-CM | POA: Diagnosis not present

## 2020-11-12 DIAGNOSIS — Z419 Encounter for procedure for purposes other than remedying health state, unspecified: Secondary | ICD-10-CM

## 2020-11-12 DIAGNOSIS — M1611 Unilateral primary osteoarthritis, right hip: Secondary | ICD-10-CM | POA: Diagnosis not present

## 2020-11-12 DIAGNOSIS — Z885 Allergy status to narcotic agent status: Secondary | ICD-10-CM | POA: Insufficient documentation

## 2020-11-12 DIAGNOSIS — Z87891 Personal history of nicotine dependence: Secondary | ICD-10-CM | POA: Diagnosis not present

## 2020-11-12 DIAGNOSIS — Z9221 Personal history of antineoplastic chemotherapy: Secondary | ICD-10-CM | POA: Diagnosis not present

## 2020-11-12 DIAGNOSIS — Z923 Personal history of irradiation: Secondary | ICD-10-CM | POA: Insufficient documentation

## 2020-11-12 DIAGNOSIS — Z96641 Presence of right artificial hip joint: Secondary | ICD-10-CM | POA: Diagnosis not present

## 2020-11-12 DIAGNOSIS — E039 Hypothyroidism, unspecified: Secondary | ICD-10-CM | POA: Diagnosis not present

## 2020-11-12 DIAGNOSIS — Z91048 Other nonmedicinal substance allergy status: Secondary | ICD-10-CM | POA: Diagnosis not present

## 2020-11-12 DIAGNOSIS — Z471 Aftercare following joint replacement surgery: Secondary | ICD-10-CM | POA: Diagnosis not present

## 2020-11-12 DIAGNOSIS — Z79890 Hormone replacement therapy (postmenopausal): Secondary | ICD-10-CM | POA: Diagnosis not present

## 2020-11-12 DIAGNOSIS — E785 Hyperlipidemia, unspecified: Secondary | ICD-10-CM | POA: Diagnosis not present

## 2020-11-12 HISTORY — PX: TOTAL HIP ARTHROPLASTY: SHX124

## 2020-11-12 LAB — GLUCOSE, CAPILLARY: Glucose-Capillary: 89 mg/dL (ref 70–99)

## 2020-11-12 LAB — TYPE AND SCREEN
ABO/RH(D): O POS
Antibody Screen: NEGATIVE

## 2020-11-12 LAB — ABO/RH: ABO/RH(D): O POS

## 2020-11-12 SURGERY — ARTHROPLASTY, HIP, TOTAL, ANTERIOR APPROACH
Anesthesia: Spinal | Site: Hip | Laterality: Right

## 2020-11-12 MED ORDER — TRANEXAMIC ACID 1000 MG/10ML IV SOLN
INTRAVENOUS | Status: DC | PRN
  Administered 2020-11-12: 2000 mg via TOPICAL

## 2020-11-12 MED ORDER — 0.9 % SODIUM CHLORIDE (POUR BTL) OPTIME
TOPICAL | Status: DC | PRN
  Administered 2020-11-12: 1000 mL

## 2020-11-12 MED ORDER — METHOCARBAMOL 500 MG IVPB - SIMPLE MED: 500.0000 mg | Freq: Four times a day (QID) | INTRAVENOUS | Status: DC | PRN

## 2020-11-12 MED ORDER — BUPIVACAINE-EPINEPHRINE (PF) 0.25% -1:200000 IJ SOLN
INTRAMUSCULAR | Status: AC
  Filled 2020-11-12: qty 30

## 2020-11-12 MED ORDER — FENTANYL CITRATE (PF) 100 MCG/2ML IJ SOLN
INTRAMUSCULAR | Status: AC
  Filled 2020-11-12: qty 2

## 2020-11-12 MED ORDER — PROPOFOL 500 MG/50ML IV EMUL
INTRAVENOUS | Status: AC
  Filled 2020-11-12: qty 50

## 2020-11-12 MED ORDER — STERILE WATER FOR IRRIGATION IR SOLN
Status: DC | PRN
  Administered 2020-11-12: 2000 mL

## 2020-11-12 MED ORDER — FENTANYL CITRATE (PF) 100 MCG/2ML IJ SOLN
INTRAMUSCULAR | Status: DC | PRN
  Administered 2020-11-12: 50 ug via INTRAVENOUS

## 2020-11-12 MED ORDER — TRANEXAMIC ACID-NACL 1000-0.7 MG/100ML-% IV SOLN: 1000.0000 mg | Freq: Once | INTRAVENOUS | Status: DC

## 2020-11-12 MED ORDER — LIDOCAINE 2% (20 MG/ML) 5 ML SYRINGE
INTRAMUSCULAR | Status: DC | PRN
  Administered 2020-11-12: 40 mg via INTRAVENOUS

## 2020-11-12 MED ORDER — FENTANYL CITRATE (PF) 100 MCG/2ML IJ SOLN: 25.0000 ug | INTRAMUSCULAR | Status: DC | PRN

## 2020-11-12 MED ORDER — LIDOCAINE HCL (PF) 2 % IJ SOLN
INTRAMUSCULAR | Status: AC
  Filled 2020-11-12: qty 20

## 2020-11-12 MED ORDER — BUPIVACAINE-MELOXICAM ER 400-12 MG/14ML IJ SOLN
INTRAMUSCULAR | Status: AC
  Filled 2020-11-12: qty 1

## 2020-11-12 MED ORDER — LACTATED RINGERS IV BOLUS: 250.0000 mL | Freq: Once | INTRAVENOUS | Status: DC

## 2020-11-12 MED ORDER — BUPIVACAINE-MELOXICAM ER 400-12 MG/14ML IJ SOLN
INTRAMUSCULAR | Status: DC | PRN
  Administered 2020-11-12: 400 mg

## 2020-11-12 MED ORDER — PROPOFOL 500 MG/50ML IV EMUL
INTRAVENOUS | Status: DC | PRN
  Administered 2020-11-12: 90 ug/kg/min via INTRAVENOUS
  Administered 2020-11-12: 75 ug/kg/min via INTRAVENOUS

## 2020-11-12 MED ORDER — EPHEDRINE 5 MG/ML INJ
INTRAVENOUS | Status: AC
  Filled 2020-11-12: qty 10

## 2020-11-12 MED ORDER — BUPIVACAINE IN DEXTROSE 0.75-8.25 % IT SOLN
INTRATHECAL | Status: DC | PRN
  Administered 2020-11-12: 1.8 mL via INTRATHECAL

## 2020-11-12 MED ORDER — ONDANSETRON HCL 4 MG/2ML IJ SOLN: 4.0000 mg | Freq: Once | INTRAMUSCULAR | Status: DC | PRN

## 2020-11-12 MED ORDER — POVIDONE-IODINE 10 % EX SWAB
2.0000 "application " | Freq: Once | CUTANEOUS | Status: AC
  Administered 2020-11-12: 2 via TOPICAL

## 2020-11-12 MED ORDER — PROPOFOL 10 MG/ML IV BOLUS
INTRAVENOUS | Status: AC
  Filled 2020-11-12: qty 20

## 2020-11-12 MED ORDER — CEFAZOLIN SODIUM-DEXTROSE 2-4 GM/100ML-% IV SOLN: 2.0000 g | Freq: Four times a day (QID) | INTRAVENOUS | Status: DC

## 2020-11-12 MED ORDER — CEFAZOLIN SODIUM-DEXTROSE 2-4 GM/100ML-% IV SOLN
2.0000 g | INTRAVENOUS | Status: AC
  Administered 2020-11-12: 2 g via INTRAVENOUS
  Filled 2020-11-12: qty 100

## 2020-11-12 MED ORDER — LACTATED RINGERS IV BOLUS
250.0000 mL | Freq: Once | INTRAVENOUS | Status: AC
  Administered 2020-11-12: 250 mL via INTRAVENOUS

## 2020-11-12 MED ORDER — TRANEXAMIC ACID-NACL 1000-0.7 MG/100ML-% IV SOLN
1000.0000 mg | INTRAVENOUS | Status: AC
  Administered 2020-11-12: 1000 mg via INTRAVENOUS
  Filled 2020-11-12: qty 100

## 2020-11-12 MED ORDER — TIZANIDINE HCL 4 MG PO TABS: 4.0000 mg | ORAL_TABLET | Freq: Four times a day (QID) | ORAL | 1 refills | Status: DC | PRN

## 2020-11-12 MED ORDER — ACETAMINOPHEN 10 MG/ML IV SOLN: 1000.0000 mg | Freq: Once | INTRAVENOUS | Status: DC | PRN

## 2020-11-12 MED ORDER — LACTATED RINGERS IV SOLN: INTRAVENOUS | Status: DC

## 2020-11-12 MED ORDER — METHOCARBAMOL 500 MG PO TABS: 500.0000 mg | ORAL_TABLET | Freq: Four times a day (QID) | ORAL | Status: DC | PRN

## 2020-11-12 MED ORDER — PHENYLEPHRINE HCL-NACL 10-0.9 MG/250ML-% IV SOLN
INTRAVENOUS | Status: DC | PRN
  Administered 2020-11-12: 50 ug/min via INTRAVENOUS
  Administered 2020-11-12: 25 ug/min via INTRAVENOUS

## 2020-11-12 MED ORDER — LACTATED RINGERS IV BOLUS
500.0000 mL | Freq: Once | INTRAVENOUS | Status: AC
  Administered 2020-11-12: 500 mL via INTRAVENOUS

## 2020-11-12 MED ORDER — PROPOFOL 10 MG/ML IV BOLUS
INTRAVENOUS | Status: DC | PRN
  Administered 2020-11-12 (×2): 10 mg via INTRAVENOUS

## 2020-11-12 MED ORDER — ONDANSETRON HCL 4 MG/2ML IJ SOLN
INTRAMUSCULAR | Status: DC | PRN
  Administered 2020-11-12: 4 mg via INTRAVENOUS

## 2020-11-12 SURGICAL SUPPLY — 46 items
BAG DECANTER FOR FLEXI CONT (MISCELLANEOUS) ×2 IMPLANT
BLADE SAW SGTL 18X1.27X75 (BLADE) ×2 IMPLANT
BOOTIES KNEE HIGH SLOAN (MISCELLANEOUS) ×2 IMPLANT
CELLS DAT CNTRL 66122 CELL SVR (MISCELLANEOUS) ×1 IMPLANT
COVER PERINEAL POST (MISCELLANEOUS) ×2 IMPLANT
COVER SURGICAL LIGHT HANDLE (MISCELLANEOUS) ×2 IMPLANT
COVER WAND RF STERILE (DRAPES) IMPLANT
CUP ACETABULAR GRIPTON 100 52 (Orthopedic Implant) IMPLANT
DECANTER SPIKE VIAL GLASS SM (MISCELLANEOUS) ×2 IMPLANT
DRAPE IMP U-DRAPE 54X76 (DRAPES) ×2 IMPLANT
DRAPE ORTHO SPLIT 77X108 STRL (DRAPES)
DRAPE STERI IOBAN 125X83 (DRAPES) ×2 IMPLANT
DRAPE SURG ORHT 6 SPLT 77X108 (DRAPES) IMPLANT
DRAPE U-SHAPE 47X51 STRL (DRAPES) ×4 IMPLANT
DRSG AQUACEL AG ADV 3.5X 6 (GAUZE/BANDAGES/DRESSINGS) ×2 IMPLANT
DURAPREP 26ML APPLICATOR (WOUND CARE) ×2 IMPLANT
ELECT BLADE TIP CTD 4 INCH (ELECTRODE) ×2 IMPLANT
ELECT REM PT RETURN 15FT ADLT (MISCELLANEOUS) ×2 IMPLANT
ELIMINATOR HOLE APEX DEPUY (Hips) ×1 IMPLANT
GLOVE SRG 8 PF TXTR STRL LF DI (GLOVE) ×2 IMPLANT
GLOVE SURG ENC MOIS LTX SZ8 (GLOVE) ×4 IMPLANT
GLOVE SURG UNDER POLY LF SZ8 (GLOVE) ×4
GOWN STRL REUS W/TWL XL LVL3 (GOWN DISPOSABLE) ×4 IMPLANT
GRIPTON 100 52 (Orthopedic Implant) ×2 IMPLANT
HEAD M SROM 36MM 2 (Hips) IMPLANT
HOLDER FOLEY CATH W/STRAP (MISCELLANEOUS) ×2 IMPLANT
KIT TURNOVER KIT A (KITS) ×2 IMPLANT
LINER ACETAB NEUTRAL 36ID 520D (Liner) ×1 IMPLANT
MANIFOLD NEPTUNE II (INSTRUMENTS) ×2 IMPLANT
NEEDLE HYPO 22GX1.5 SAFETY (NEEDLE) ×2 IMPLANT
NS IRRIG 1000ML POUR BTL (IV SOLUTION) ×2 IMPLANT
PACK ANTERIOR HIP CUSTOM (KITS) ×2 IMPLANT
PENCIL SMOKE EVACUATOR (MISCELLANEOUS) IMPLANT
PROTECTOR NERVE ULNAR (MISCELLANEOUS) ×2 IMPLANT
RETRACTOR WND ALEXIS 18 MED (MISCELLANEOUS) ×1 IMPLANT
RTRCTR WOUND ALEXIS 18CM MED (MISCELLANEOUS) ×2
SROM M HEAD 36MM 2 (Hips) ×2 IMPLANT
STEM FEMORAL SZ 5MM STD ACTIS (Stem) ×1 IMPLANT
SUT ETHIBOND NAB CT1 #1 30IN (SUTURE) ×4 IMPLANT
SUT VIC AB 1 CT1 36 (SUTURE) ×2 IMPLANT
SUT VIC AB 2-0 CT1 27 (SUTURE) ×2
SUT VIC AB 2-0 CT1 TAPERPNT 27 (SUTURE) ×1 IMPLANT
SUT VICRYL AB 3-0 FS1 BRD 27IN (SUTURE) ×2 IMPLANT
SUT VLOC 180 0 24IN GS25 (SUTURE) ×2 IMPLANT
SYR 50ML LL SCALE MARK (SYRINGE) ×2 IMPLANT
TRAY FOLEY MTR SLVR 16FR STAT (SET/KITS/TRAYS/PACK) ×2 IMPLANT

## 2020-11-12 NOTE — Op Note (Signed)

## 2020-11-12 NOTE — Transfer of Care (Signed)
Immediate Anesthesia Transfer of Care Note  Patient: Jacqueline Mata  Procedure(s) Performed: RIGHT TOTAL HIP ARTHROPLASTY ANTERIOR APPROACH (Right Hip)  Patient Location: PACU  Anesthesia Type:MAC and Spinal  Level of Consciousness: awake, alert , oriented and patient cooperative  Airway & Oxygen Therapy: Patient Spontanous Breathing and Patient connected to face mask oxygen  Post-op Assessment: Report given to RN and Post -op Vital signs reviewed and stable  Post vital signs: Reviewed and stable  Last Vitals:  Vitals Value Taken Time  BP 124/65 11/12/20 1430  Temp    Pulse 71 11/12/20 1431  Resp 20 11/12/20 1431  SpO2 100 % 11/12/20 1431  Vitals shown include unvalidated device data.  Last Pain:  Vitals:   11/12/20 1127  TempSrc:   PainSc: 0-No pain         Complications: No complications documented.

## 2020-11-12 NOTE — Evaluation (Addendum)
Physical Therapy Evaluation Patient Details Name: Jacqueline Mata MRN: [id number] DOB: 1940-12-01 Today's Date: 11/12/2020   History of Present Illness  Patient is 80 y.o. female s/p Rt THA anterior approach on 11/12/20 with PMH significant for HLD, Hypothyroidism, breast cancer, OA, DM, bil TKA's.  Clinical Impression  Pt is a 79y.o. female s/p Rt THA POD 0. Pt reports that she was using RW for ambulation prior to surgery. Pt required MIN guard and verbal cues for sit to stand transfers.Pt required MIN guard progressing to supervision for ambulation 120'ft with verbal cues for RW management. Pt demonstrated step through gait pattern with no LOB. Pt was able to safely perform stair negotiation with  MIN guard for safety and cues for sequencing.  Pt's friend was able to demonstrate safe guarding position with ambulation and stair negotiation with cues from therapist. Pt will have 24/7 assistance from friends. Pt is currently at a safe mobility level for discharge. Recommend home with supervision from friends. Pt will benefit from skilled PT to increase independence and safety with mobility.      Follow Up Recommendations Home health PT;Follow surgeon's recommendation for DC plan and follow-up therapies    Equipment Recommendations  Rolling walker with 5" wheels    Recommendations for Other Services       Precautions / Restrictions Precautions Precautions: Fall Restrictions Weight Bearing Restrictions: No Other Position/Activity Restrictions: WBAT      Mobility  Bed Mobility Overal bed mobility: Needs Assistance Bed Mobility: Supine to Sit     Supine to sit: Supervision     General bed mobility comments: pt with use of bed rail and B UEs to scoot to EOB    Transfers Overall transfer level: Needs assistance Equipment used: Rolling walker (2 wheeled) Transfers: Sit to/from Stand Sit to Stand: Min guard         General transfer comment: MIN guard for safety and cues for  safe hand placement from EOB and use of grab bar and RW with rise from toilet.  Ambulation/Gait Ambulation/Gait assistance: Min guard;Supervision Gait Distance (Feet): 120 Feet Assistive device: Rolling walker (2 wheeled) Gait Pattern/deviations: Step-through pattern;Decreased stride length;Decreased weight shift to right Gait velocity: fair   General Gait Details: MIN guard progressing to supervision for safety and cues for RW management with no LOB. Pt's friend was able to demonstrate safe guarding position with cues form therapist.  Stairs Stairs: Yes Stairs assistance: Min assist Stair Management: Two rails;Forwards Number of Stairs: 6 (3x2) General stair comments: pt was able to safely negotiate stairs with use of B handrails and MIN asisst for safety and RW management with cues for sequencing. Pt's friend was able to demonstrate safe guarding position with cues from therapist and pt was able to verbally direct care accurately.  Wheelchair Mobility    Modified Rankin (Stroke Patients Only)       Balance Overall balance assessment: Needs assistance Sitting-balance support: Feet supported Sitting balance-Leahy Scale: Good     Standing balance support: Bilateral upper extremity supported;During functional activity Standing balance-Leahy Scale: Fair Standing balance comment: pt able to stand at sink for hand hygiene and for donning underwear with min guard-supervision for safety without use of B UEs on RW.                             Pertinent Vitals/Pain Pain Assessment: 0-10 Pain Score: 3  Pain Location: Rt hip Pain Descriptors / Indicators: Discomfort;Sore Pain Intervention(s):  Limited activity within patient's tolerance;Monitored during session;Repositioned    Home Living Family/patient expects to be discharged to:: Private residence Living Arrangements: Alone Available Help at Discharge: Friend(s);Available 24 hours/day Type of Home: House Home  Access: Stairs to enter Entrance Stairs-Rails: Can reach both Entrance Stairs-Number of Steps: 4 Home Layout: Two level;Able to live on main level with bedroom/bathroom Home Equipment: Bedside commode;Grab bars - tub/shower;Cane - single point;Toilet riser Additional Comments: pt's friends will be staying at her home and will be available to assist 24/7.    Prior Function Level of Independence: Independent with assistive device(s)         Comments: using RW that she borrowed from friend for ambulation     Hand Dominance   Dominant Hand: Right    Extremity/Trunk Assessment   Upper Extremity Assessment Upper Extremity Assessment: Overall WFL for tasks assessed    Lower Extremity Assessment Lower Extremity Assessment: RLE deficits/detail RLE Deficits / Details: pt with good Rt quad set strength and 4/5 dorsi/plantar flexion strength RLE Sensation: WNL RLE Coordination: WNL    Cervical / Trunk Assessment Cervical / Trunk Assessment: Normal  Communication   Communication: No difficulties  Cognition Arousal/Alertness: Awake/alert Behavior During Therapy: WFL for tasks assessed/performed Overall Cognitive Status: Within Functional Limits for tasks assessed                                        General Comments      Exercises Total Joint Exercises Ankle Circles/Pumps: AROM;Both;20 reps;Seated Quad Sets: AROM;Right;5 reps;Seated Short Arc Quad: AROM;Right;5 reps;Seated Heel Slides: AROM;Right;5 reps;Seated Hip ABduction/ADduction: AROM;Right;5 reps;Seated Long Arc Quad: AROM;Right;5 reps;Seated   Assessment/Plan    PT Assessment Patient needs continued PT services  PT Problem List Decreased strength;Decreased range of motion;Decreased activity tolerance;Decreased balance;Decreased mobility;Decreased knowledge of use of DME;Pain       PT Treatment Interventions DME instruction;Gait training;Stair training;Functional mobility training;Therapeutic  activities;Therapeutic exercise;Balance training;Patient/family education    PT Goals (Current goals can be found in the Care Plan section)  Acute Rehab PT Goals Patient Stated Goal: begin working out at the gym and get back to walking and doing activities at church PT Goal Formulation: With patient (and friend) Time For Goal Achievement: 11/19/20 Potential to Achieve Goals: Good    Frequency 7X/week   Barriers to discharge        Co-evaluation               AM-PAC PT "6 Clicks" Mobility  Outcome Measure Help needed turning from your back to your side while in a flat bed without using bedrails?: None Help needed moving from lying on your back to sitting on the side of a flat bed without using bedrails?: A Little Help needed moving to and from a bed to a chair (including a wheelchair)?: A Little Help needed standing up from a chair using your arms (e.g., wheelchair or bedside chair)?: A Little Help needed to walk in hospital room?: A Little Help needed climbing 3-5 steps with a railing? : A Little 6 Click Score: 19    End of Session Equipment Utilized During Treatment: Gait belt Activity Tolerance: Patient tolerated treatment well Patient left: in chair;with call bell/phone within reach;with family/visitor present Nurse Communication: Mobility status PT Visit Diagnosis: Unsteadiness on feet (R26.81);Muscle weakness (generalized) (M62.81);Pain    Time: 1729-1820 (9 minutes not billable, RN working with patient.) PT Time Calculation (min) (ACUTE  ONLY): 51 min   Charges:              Elna Breslow, SPT  Acute rehab    Elna Breslow 11/12/2020, 7:14 PM

## 2020-11-12 NOTE — Anesthesia Procedure Notes (Signed)
Spinal  Patient location during procedure: OR Start time: 11/12/2020 12:45 PM End time: 11/12/2020 12:50 PM Staffing Performed: anesthesiologist  Anesthesiologist: Murvin Natal, MD Preanesthetic Checklist Completed: patient identified, IV checked, risks and benefits discussed, surgical consent, monitors and equipment checked, pre-op evaluation and timeout performed Spinal Block Patient position: sitting Prep: DuraPrep Patient monitoring: cardiac monitor, continuous pulse ox and blood pressure Approach: midline Location: L4-5 Injection technique: single-shot Needle Needle type: Pencan  Needle gauge: 24 G Needle length: 9 cm Assessment Sensory level: T10 Additional Notes Functioning IV was confirmed and monitors were applied. Sterile prep and drape, including hand hygiene and sterile gloves were used. The patient was positioned and the spine was prepped. The skin was anesthetized with lidocaine.  Free flow of clear CSF was obtained prior to injecting local anesthetic into the CSF.  The spinal needle aspirated freely following injection.  The needle was carefully withdrawn.  The patient tolerated the procedure well.

## 2020-11-12 NOTE — Interval H&P Note (Signed)
History and Physical Interval Note:  11/12/2020 11:39 AM  Jacqueline Mata  has presented today for surgery, with the diagnosis of RIGHT HIP DEGENERATIVE JOINT DISEASE.  The various methods of treatment have been discussed with the patient and family. After consideration of risks, benefits and other options for treatment, the patient has consented to  Procedure(s): RIGHT TOTAL HIP ARTHROPLASTY ANTERIOR APPROACH (Right) as a surgical intervention.  The patient's history has been reviewed, patient examined, no change in status, stable for surgery.  I have reviewed the patient's chart and labs.  Questions were answered to the patient's satisfaction.     Hessie Dibble

## 2020-11-13 ENCOUNTER — Encounter (HOSPITAL_COMMUNITY): Payer: Self-pay | Admitting: Orthopaedic Surgery

## 2020-11-13 DIAGNOSIS — M5416 Radiculopathy, lumbar region: Secondary | ICD-10-CM | POA: Diagnosis not present

## 2020-11-13 DIAGNOSIS — E039 Hypothyroidism, unspecified: Secondary | ICD-10-CM | POA: Diagnosis not present

## 2020-11-13 DIAGNOSIS — E119 Type 2 diabetes mellitus without complications: Secondary | ICD-10-CM | POA: Diagnosis not present

## 2020-11-13 DIAGNOSIS — Z9221 Personal history of antineoplastic chemotherapy: Secondary | ICD-10-CM | POA: Diagnosis not present

## 2020-11-13 DIAGNOSIS — F419 Anxiety disorder, unspecified: Secondary | ICD-10-CM | POA: Diagnosis not present

## 2020-11-13 DIAGNOSIS — Z96641 Presence of right artificial hip joint: Secondary | ICD-10-CM | POA: Diagnosis not present

## 2020-11-13 DIAGNOSIS — M9901 Segmental and somatic dysfunction of cervical region: Secondary | ICD-10-CM | POA: Diagnosis not present

## 2020-11-13 DIAGNOSIS — E785 Hyperlipidemia, unspecified: Secondary | ICD-10-CM | POA: Diagnosis not present

## 2020-11-13 DIAGNOSIS — Z471 Aftercare following joint replacement surgery: Secondary | ICD-10-CM | POA: Diagnosis not present

## 2020-11-13 DIAGNOSIS — M9903 Segmental and somatic dysfunction of lumbar region: Secondary | ICD-10-CM | POA: Diagnosis not present

## 2020-11-13 DIAGNOSIS — Z853 Personal history of malignant neoplasm of breast: Secondary | ICD-10-CM | POA: Diagnosis not present

## 2020-11-13 DIAGNOSIS — Z9013 Acquired absence of bilateral breasts and nipples: Secondary | ICD-10-CM | POA: Diagnosis not present

## 2020-11-13 DIAGNOSIS — Z96653 Presence of artificial knee joint, bilateral: Secondary | ICD-10-CM | POA: Diagnosis not present

## 2020-11-13 DIAGNOSIS — Z923 Personal history of irradiation: Secondary | ICD-10-CM | POA: Diagnosis not present

## 2020-11-13 DIAGNOSIS — Z87891 Personal history of nicotine dependence: Secondary | ICD-10-CM | POA: Diagnosis not present

## 2020-11-13 DIAGNOSIS — M62830 Muscle spasm of back: Secondary | ICD-10-CM | POA: Diagnosis not present

## 2020-11-13 NOTE — Anesthesia Postprocedure Evaluation (Signed)
Anesthesia Post Note  Patient: Jacqueline Mata  Procedure(s) Performed: RIGHT TOTAL HIP ARTHROPLASTY ANTERIOR APPROACH (Right Hip)     Patient location during evaluation: PACU Anesthesia Type: Spinal Level of consciousness: awake Pain management: pain level controlled Vital Signs Assessment: post-procedure vital signs reviewed and stable Respiratory status: spontaneous breathing, respiratory function stable and patient connected to nasal cannula oxygen Cardiovascular status: blood pressure returned to baseline and stable Postop Assessment: no headache, no backache and no apparent nausea or vomiting Anesthetic complications: no   No complications documented.  Last Vitals:  Vitals:   11/12/20 1700 11/12/20 1800  BP: (!) 156/88 (!) 156/82  Pulse: (!) 104 94  Resp: 16 16  Temp:  36.7 C  SpO2: 98% 99%    Last Pain:  Vitals:   11/12/20 1800  TempSrc:   PainSc: 4                  Keisi Eckford P Neamiah Sciarra

## 2020-11-20 DIAGNOSIS — M9901 Segmental and somatic dysfunction of cervical region: Secondary | ICD-10-CM | POA: Diagnosis not present

## 2020-11-20 DIAGNOSIS — M9903 Segmental and somatic dysfunction of lumbar region: Secondary | ICD-10-CM | POA: Diagnosis not present

## 2020-11-20 DIAGNOSIS — M5416 Radiculopathy, lumbar region: Secondary | ICD-10-CM | POA: Diagnosis not present

## 2020-11-20 DIAGNOSIS — M62830 Muscle spasm of back: Secondary | ICD-10-CM | POA: Diagnosis not present

## 2020-11-22 ENCOUNTER — Other Ambulatory Visit: Payer: PPO

## 2020-11-22 DIAGNOSIS — Z9889 Other specified postprocedural states: Secondary | ICD-10-CM | POA: Diagnosis not present

## 2020-11-22 DIAGNOSIS — Z96641 Presence of right artificial hip joint: Secondary | ICD-10-CM | POA: Diagnosis not present

## 2020-11-26 ENCOUNTER — Ambulatory Visit: Payer: PPO | Admitting: Internal Medicine

## 2020-11-27 DIAGNOSIS — E785 Hyperlipidemia, unspecified: Secondary | ICD-10-CM | POA: Diagnosis not present

## 2020-11-27 DIAGNOSIS — Z96653 Presence of artificial knee joint, bilateral: Secondary | ICD-10-CM | POA: Diagnosis not present

## 2020-11-27 DIAGNOSIS — M9901 Segmental and somatic dysfunction of cervical region: Secondary | ICD-10-CM | POA: Diagnosis not present

## 2020-11-27 DIAGNOSIS — Z96641 Presence of right artificial hip joint: Secondary | ICD-10-CM | POA: Diagnosis not present

## 2020-11-27 DIAGNOSIS — E119 Type 2 diabetes mellitus without complications: Secondary | ICD-10-CM | POA: Diagnosis not present

## 2020-11-27 DIAGNOSIS — Z87891 Personal history of nicotine dependence: Secondary | ICD-10-CM | POA: Diagnosis not present

## 2020-11-27 DIAGNOSIS — Z9221 Personal history of antineoplastic chemotherapy: Secondary | ICD-10-CM | POA: Diagnosis not present

## 2020-11-27 DIAGNOSIS — F419 Anxiety disorder, unspecified: Secondary | ICD-10-CM | POA: Diagnosis not present

## 2020-11-27 DIAGNOSIS — Z471 Aftercare following joint replacement surgery: Secondary | ICD-10-CM | POA: Diagnosis not present

## 2020-11-27 DIAGNOSIS — Z923 Personal history of irradiation: Secondary | ICD-10-CM | POA: Diagnosis not present

## 2020-11-27 DIAGNOSIS — M9903 Segmental and somatic dysfunction of lumbar region: Secondary | ICD-10-CM | POA: Diagnosis not present

## 2020-11-27 DIAGNOSIS — Z853 Personal history of malignant neoplasm of breast: Secondary | ICD-10-CM | POA: Diagnosis not present

## 2020-11-27 DIAGNOSIS — Z9013 Acquired absence of bilateral breasts and nipples: Secondary | ICD-10-CM | POA: Diagnosis not present

## 2020-11-27 DIAGNOSIS — M62830 Muscle spasm of back: Secondary | ICD-10-CM | POA: Diagnosis not present

## 2020-11-27 DIAGNOSIS — X32XXXA Exposure to sunlight, initial encounter: Secondary | ICD-10-CM | POA: Diagnosis not present

## 2020-11-27 DIAGNOSIS — E039 Hypothyroidism, unspecified: Secondary | ICD-10-CM | POA: Diagnosis not present

## 2020-11-27 DIAGNOSIS — Z08 Encounter for follow-up examination after completed treatment for malignant neoplasm: Secondary | ICD-10-CM | POA: Diagnosis not present

## 2020-11-27 DIAGNOSIS — Z85828 Personal history of other malignant neoplasm of skin: Secondary | ICD-10-CM | POA: Diagnosis not present

## 2020-11-27 DIAGNOSIS — L570 Actinic keratosis: Secondary | ICD-10-CM | POA: Diagnosis not present

## 2020-11-27 DIAGNOSIS — L718 Other rosacea: Secondary | ICD-10-CM | POA: Diagnosis not present

## 2020-11-27 DIAGNOSIS — M5416 Radiculopathy, lumbar region: Secondary | ICD-10-CM | POA: Diagnosis not present

## 2020-12-04 DIAGNOSIS — R2689 Other abnormalities of gait and mobility: Secondary | ICD-10-CM | POA: Diagnosis not present

## 2020-12-04 DIAGNOSIS — M25551 Pain in right hip: Secondary | ICD-10-CM | POA: Diagnosis not present

## 2020-12-06 DIAGNOSIS — Z9889 Other specified postprocedural states: Secondary | ICD-10-CM | POA: Diagnosis not present

## 2020-12-10 DIAGNOSIS — R2689 Other abnormalities of gait and mobility: Secondary | ICD-10-CM | POA: Diagnosis not present

## 2020-12-10 DIAGNOSIS — M25551 Pain in right hip: Secondary | ICD-10-CM | POA: Diagnosis not present

## 2020-12-11 DIAGNOSIS — M9901 Segmental and somatic dysfunction of cervical region: Secondary | ICD-10-CM | POA: Diagnosis not present

## 2020-12-11 DIAGNOSIS — M9903 Segmental and somatic dysfunction of lumbar region: Secondary | ICD-10-CM | POA: Diagnosis not present

## 2020-12-11 DIAGNOSIS — M62830 Muscle spasm of back: Secondary | ICD-10-CM | POA: Diagnosis not present

## 2020-12-11 DIAGNOSIS — M5416 Radiculopathy, lumbar region: Secondary | ICD-10-CM | POA: Diagnosis not present

## 2020-12-13 DIAGNOSIS — R2689 Other abnormalities of gait and mobility: Secondary | ICD-10-CM | POA: Diagnosis not present

## 2020-12-13 DIAGNOSIS — M25551 Pain in right hip: Secondary | ICD-10-CM | POA: Diagnosis not present

## 2020-12-18 DIAGNOSIS — M25551 Pain in right hip: Secondary | ICD-10-CM | POA: Diagnosis not present

## 2020-12-18 DIAGNOSIS — R2689 Other abnormalities of gait and mobility: Secondary | ICD-10-CM | POA: Diagnosis not present

## 2020-12-20 DIAGNOSIS — M25551 Pain in right hip: Secondary | ICD-10-CM | POA: Diagnosis not present

## 2020-12-20 DIAGNOSIS — R2689 Other abnormalities of gait and mobility: Secondary | ICD-10-CM | POA: Diagnosis not present

## 2020-12-24 DIAGNOSIS — M25551 Pain in right hip: Secondary | ICD-10-CM | POA: Diagnosis not present

## 2020-12-24 DIAGNOSIS — R2689 Other abnormalities of gait and mobility: Secondary | ICD-10-CM | POA: Diagnosis not present

## 2020-12-25 ENCOUNTER — Other Ambulatory Visit: Payer: Self-pay | Admitting: Internal Medicine

## 2020-12-26 ENCOUNTER — Telehealth: Payer: Self-pay | Admitting: *Deleted

## 2020-12-26 NOTE — Telephone Encounter (Signed)
She does not need any.  a1c etc was checked last month

## 2020-12-26 NOTE — Telephone Encounter (Signed)
Appt cancelled & pt notified

## 2020-12-26 NOTE — Telephone Encounter (Signed)
Please place future orders for lab appt.  

## 2020-12-27 ENCOUNTER — Other Ambulatory Visit: Payer: PPO

## 2020-12-27 ENCOUNTER — Other Ambulatory Visit: Payer: Self-pay | Admitting: Internal Medicine

## 2020-12-27 DIAGNOSIS — Z1231 Encounter for screening mammogram for malignant neoplasm of breast: Secondary | ICD-10-CM

## 2020-12-31 ENCOUNTER — Inpatient Hospital Stay: Payer: PPO | Attending: Hematology & Oncology

## 2020-12-31 ENCOUNTER — Telehealth: Payer: Self-pay

## 2020-12-31 ENCOUNTER — Other Ambulatory Visit: Payer: Self-pay

## 2020-12-31 ENCOUNTER — Encounter: Payer: Self-pay | Admitting: Hematology & Oncology

## 2020-12-31 ENCOUNTER — Inpatient Hospital Stay: Payer: PPO | Attending: Hematology & Oncology | Admitting: Hematology & Oncology

## 2020-12-31 DIAGNOSIS — Z9221 Personal history of antineoplastic chemotherapy: Secondary | ICD-10-CM | POA: Insufficient documentation

## 2020-12-31 DIAGNOSIS — C50919 Malignant neoplasm of unspecified site of unspecified female breast: Secondary | ICD-10-CM | POA: Diagnosis not present

## 2020-12-31 DIAGNOSIS — Z853 Personal history of malignant neoplasm of breast: Secondary | ICD-10-CM | POA: Insufficient documentation

## 2020-12-31 DIAGNOSIS — Z170 Estrogen receptor positive status [ER+]: Secondary | ICD-10-CM

## 2020-12-31 LAB — CBC WITH DIFFERENTIAL (CANCER CENTER ONLY)
Abs Immature Granulocytes: 0.02 10*3/uL (ref 0.00–0.07)
Basophils Absolute: 0.1 10*3/uL (ref 0.0–0.1)
Basophils Relative: 1 %
Eosinophils Absolute: 0.4 10*3/uL (ref 0.0–0.5)
Eosinophils Relative: 4 %
HCT: 39.2 % (ref 36.0–46.0)
Hemoglobin: 13.3 g/dL (ref 12.0–15.0)
Immature Granulocytes: 0 %
Lymphocytes Relative: 28 %
Lymphs Abs: 2.3 10*3/uL (ref 0.7–4.0)
MCH: 30.9 pg (ref 26.0–34.0)
MCHC: 33.9 g/dL (ref 30.0–36.0)
MCV: 91.2 fL (ref 80.0–100.0)
Monocytes Absolute: 0.5 10*3/uL (ref 0.1–1.0)
Monocytes Relative: 7 %
Neutro Abs: 5 10*3/uL (ref 1.7–7.7)
Neutrophils Relative %: 60 %
Platelet Count: 194 10*3/uL (ref 150–400)
RBC: 4.3 MIL/uL (ref 3.87–5.11)
RDW: 12.7 % (ref 11.5–15.5)
WBC Count: 8.4 10*3/uL (ref 4.0–10.5)
nRBC: 0 % (ref 0.0–0.2)

## 2020-12-31 LAB — CMP (CANCER CENTER ONLY)
ALT: 19 U/L (ref 0–44)
AST: 17 U/L (ref 15–41)
Albumin: 4.4 g/dL (ref 3.5–5.0)
Alkaline Phosphatase: 87 U/L (ref 38–126)
Anion gap: 8 (ref 5–15)
BUN: 19 mg/dL (ref 8–23)
CO2: 26 mmol/L (ref 22–32)
Calcium: 10.4 mg/dL — ABNORMAL HIGH (ref 8.9–10.3)
Chloride: 104 mmol/L (ref 98–111)
Creatinine: 0.91 mg/dL (ref 0.44–1.00)
GFR, Estimated: >60 mL/min (ref >60)
Glucose, Bld: 120 mg/dL — ABNORMAL HIGH (ref 70–99)
Potassium: 4.1 mmol/L (ref 3.5–5.1)
Sodium: 138 mmol/L (ref 135–145)
Total Bilirubin: 0.8 mg/dL (ref 0.3–1.2)
Total Protein: 7.1 g/dL (ref 6.5–8.1)

## 2020-12-31 LAB — LACTATE DEHYDROGENASE: LDH: 167 U/L (ref 98–192)

## 2020-12-31 NOTE — Progress Notes (Signed)
Hematology and Oncology Follow Up Visit  Jacqueline Mata [id number] July 28, 1941 80 y.o. 12/31/2020   Principle Diagnosis:  Synchronous bilateral stage IIA (T2 N0 M0) ductal carcinoma of bilateral breast - ER+/HER2-.  Current Therapy:    Observation     Interim History:  Jacqueline Mata is comes in for followup.  She actually had right hip surgery back in early February.  The amazing Dr. Rhona Raider did this.  This was outpatient.  She did have some physical therapy.  She is doing incredibly well with this.  She did lose one of her dogs right after I saw her.  This has been difficult for her.  She really loved that dog.  I can understand where she is coming from.  She has had no issues with blood sugars.  She has had no problems with diarrhea.  There has been no issues with Covid.  She has had no problems with rashes.  There has been no leg swelling.  She has had no change in bowel or bladder habits.  Overall, I would say performance status is ECOG 1.   Medications:  Current Outpatient Medications:  .  Blood Glucose Monitoring Suppl KIT, by Does not apply route., Disp: , Rfl:  .  Cholecalciferol (VITAMIN D) 50 MCG (2000 UT) tablet, Take 2,000 Units by mouth daily., Disp: , Rfl:  .  folic acid (FOLVITE) 824 MCG tablet, Take 800 mcg by mouth daily., Disp: , Rfl:  .  glucose blood (ACCU-CHEK AVIVA PLUS) test strip, Use to check blood sugar once daily., Disp: 100 each, Rfl: 5 .  Lancets (ACCU-CHEK MULTICLIX) lancets, Use once daily to check blood sugars   Dx:E11.9, Disp: 100 each, Rfl: 3 .  levothyroxine (SYNTHROID) 88 MCG tablet, TAKE 1 TABLET BY MOUTH DAILY BEFORE BREAKFAST, Disp: 90 tablet, Rfl: 0 .  loratadine (CLARITIN) 10 MG tablet, Take 10 mg by mouth daily., Disp: , Rfl:  .  metronidazole (NORITATE) 1 % cream, Apply 1 application topically in the morning and at bedtime., Disp: , Rfl:  .  Probiotic Product (PROBIOTIC DAILY PO), Take 1 capsule by mouth daily., Disp: , Rfl:  .   acetaminophen (TYLENOL) 500 MG tablet, Take 1,000 mg by mouth every 6 (six) hours as needed for moderate pain. (Patient not taking: Reported on 12/31/2020), Disp: , Rfl:  .  amoxicillin (AMOXIL) 500 MG capsule, Take 2,000 mg by mouth See admin instructions. Dental procedures. Takes $RemoveBeforeDEI'2000mg'[id number]$  one hour prior to dental procedures. (Patient not taking: Reported on 12/31/2020), Disp: , Rfl:  .  methocarbamol (ROBAXIN) 500 MG tablet, TAKE 1 TABLET BY MOUTH EVERY 6 HOURS AS NEEDED FOR PAIN OR SPASM, Disp: , Rfl:  .  tiZANidine (ZANAFLEX) 4 MG tablet, Take 1 tablet (4 mg total) by mouth every 6 (six) hours as needed for muscle spasms., Disp: 40 tablet, Rfl: 1  Allergies:  Allergies  Allergen Reactions  . Codeine Shortness Of Breath and Other (See Comments)    Altered mental staus  . Ilevro [Nepafenac] Other (See Comments)    Eye swelling   . Lamisil [Terbinafine] Rash and Other (See Comments)    Dysphagia and skin fungus  . Mold Extract [Trichophyton Mentagrophyte] Other (See Comments)  . Other Swelling    ILLEVRO (EYE DROPS)- EYE SWELLING  DOGS.Congestion. FEATHERS.Congestion  . Maxitrol [Neomycin-Polymyxin-Dexameth]     Unknown - dry eyes   . Metformin And Related     Shakiness   . Molds & Smuts Other (See Comments)  Congestion.  Marland Kitchen Neomycin Rash  . Neomycin-Bacitracin Zn-Polymyx Rash  . Neomycin-Polymyxin-Hc Rash  . Tape Dermatitis    Blisters  . Tapentadol Nausea And Vomiting and Other (See Comments)    Nucynta  . Terbinafine Rash    Trouble swallowing  . Tramadol Other (See Comments), Nausea Only and Nausea And Vomiting    dizziness    Past Medical History, Surgical history, Social history, and Family History were reviewed and updated.  Review of Systems: Review of Systems  Constitutional: Negative.   HENT: Negative.   Eyes: Negative.   Respiratory: Negative.   Cardiovascular: Negative.   Gastrointestinal: Negative.   Genitourinary: Negative.   Musculoskeletal: Negative.    Skin: Negative.   Neurological: Negative.   Endo/Heme/Allergies: Negative.   Psychiatric/Behavioral: Negative.      Physical Exam:  vitals were not taken for this visit.   Physical Exam Vitals reviewed.  HENT:     Head: Normocephalic and atraumatic.  Eyes:     Pupils: Pupils are equal, round, and reactive to light.  Cardiovascular:     Rate and Rhythm: Normal rate and regular rhythm.     Heart sounds: Normal heart sounds.  Pulmonary:     Effort: Pulmonary effort is normal.     Breath sounds: Normal breath sounds.  Abdominal:     General: Bowel sounds are normal.     Palpations: Abdomen is soft.  Musculoskeletal:        General: No tenderness or deformity. Normal range of motion.     Cervical back: Normal range of motion.  Lymphadenopathy:     Cervical: No cervical adenopathy.  Skin:    General: Skin is warm and dry.     Findings: No erythema or rash.  Neurological:     Mental Status: She is alert and oriented to person, place, and time.  Psychiatric:        Behavior: Behavior normal.        Thought Content: Thought content normal.        Judgment: Judgment normal.    Lab Results  Component Value Date   WBC 8.4 12/31/2020   HGB 13.3 12/31/2020   HCT 39.2 12/31/2020   MCV 91.2 12/31/2020   PLT 194 12/31/2020     Chemistry      Component Value Date/Time   NA 138 11/05/2020 1431   NA 135 04/29/2017 1157   NA 138 10/29/2016 1021   K 4.6 11/05/2020 1431   K 3.7 04/29/2017 1157   K 4.2 10/29/2016 1021   CL 105 11/05/2020 1431   CL 102 04/29/2017 1157   CO2 22 11/05/2020 1431   CO2 27 04/29/2017 1157   CO2 24 10/29/2016 1021   BUN 25 (H) 11/05/2020 1431   BUN 9 04/29/2017 1157   BUN 17.3 10/29/2016 1021   CREATININE 0.97 11/05/2020 1431   CREATININE 1.20 (H) 06/20/2020 1129   CREATININE 0.9 04/29/2017 1157   CREATININE 0.9 10/29/2016 1021      Component Value Date/Time   CALCIUM 10.2 11/05/2020 1431   CALCIUM 9.2 04/29/2017 1157   CALCIUM 10.0  10/29/2016 1021   ALKPHOS 59 10/31/2020 0833   ALKPHOS 64 04/29/2017 1157   ALKPHOS 71 10/29/2016 1021   AST 20 10/31/2020 0833   AST 17 06/20/2020 1129   AST 24 10/29/2016 1021   ALT 24 10/31/2020 0833   ALT 19 06/20/2020 1129   ALT 37 04/29/2017 1157   ALT 26 10/29/2016 1021   BILITOT 1.0 10/31/2020  7357   BILITOT 0.8 06/20/2020 1129   BILITOT 1.18 10/29/2016 1021       Impression and Plan: Jacqueline Mata is 80 year old white female with history of synchronous bilateral breast cancer. Fortunately both breast cancers were node negative. They were ER positive and HER-2 negative.   It has now been  17 years since she was treated for the breast cancer.  I really have to believe that she is going to be cured since her cancers were node negative.  Because of the hip surgery, one of her friends brought her today.  I know that it can be will be difficult for her to get to the office because of where she lives.  I do like seeing her.  It is always fun talking with her.  We will plan to get her back in 6 months.   Volanda Napoleon, MD 4/5/202212:07 PM

## 2021-01-01 DIAGNOSIS — M5416 Radiculopathy, lumbar region: Secondary | ICD-10-CM | POA: Diagnosis not present

## 2021-01-01 DIAGNOSIS — M9903 Segmental and somatic dysfunction of lumbar region: Secondary | ICD-10-CM | POA: Diagnosis not present

## 2021-01-01 DIAGNOSIS — M9901 Segmental and somatic dysfunction of cervical region: Secondary | ICD-10-CM | POA: Diagnosis not present

## 2021-01-01 DIAGNOSIS — M62830 Muscle spasm of back: Secondary | ICD-10-CM | POA: Diagnosis not present

## 2021-01-01 LAB — CANCER ANTIGEN 27.29: CA 27.29: 18.9 U/mL (ref 0.0–38.6)

## 2021-01-02 ENCOUNTER — Ambulatory Visit (INDEPENDENT_AMBULATORY_CARE_PROVIDER_SITE_OTHER): Payer: PPO | Admitting: Internal Medicine

## 2021-01-02 ENCOUNTER — Other Ambulatory Visit: Payer: Self-pay

## 2021-01-02 ENCOUNTER — Encounter: Payer: Self-pay | Admitting: Internal Medicine

## 2021-01-02 VITALS — BP 124/78 | HR 90 | Temp 98.4°F | Resp 16 | Ht 63.5 in | Wt 171.8 lb

## 2021-01-02 DIAGNOSIS — E119 Type 2 diabetes mellitus without complications: Secondary | ICD-10-CM | POA: Diagnosis not present

## 2021-01-02 DIAGNOSIS — G8929 Other chronic pain: Secondary | ICD-10-CM

## 2021-01-02 DIAGNOSIS — E782 Mixed hyperlipidemia: Secondary | ICD-10-CM

## 2021-01-02 DIAGNOSIS — M791 Myalgia, unspecified site: Secondary | ICD-10-CM

## 2021-01-02 DIAGNOSIS — M25512 Pain in left shoulder: Secondary | ICD-10-CM

## 2021-01-02 DIAGNOSIS — D0510 Intraductal carcinoma in situ of unspecified breast: Secondary | ICD-10-CM | POA: Diagnosis not present

## 2021-01-02 DIAGNOSIS — R413 Other amnesia: Secondary | ICD-10-CM | POA: Diagnosis not present

## 2021-01-02 DIAGNOSIS — R748 Abnormal levels of other serum enzymes: Secondary | ICD-10-CM

## 2021-01-02 DIAGNOSIS — M25511 Pain in right shoulder: Secondary | ICD-10-CM | POA: Diagnosis not present

## 2021-01-02 DIAGNOSIS — T466X5A Adverse effect of antihyperlipidemic and antiarteriosclerotic drugs, initial encounter: Secondary | ICD-10-CM

## 2021-01-02 MED ORDER — ERYTHROMYCIN 5 MG/GM OP OINT: 1.0000 "application " | TOPICAL_OINTMENT | Freq: Every day | OPHTHALMIC | 0 refills | Status: DC

## 2021-01-02 NOTE — Progress Notes (Signed)
Subjective:  Patient ID: Jacqueline Mata, female    DOB: 1941-02-25  Age: 80 y.o. MRN: [id number]  CC: The primary encounter diagnosis was Diabetes mellitus type 2, diet-controlled ([place]). Diagnoses of Ductal carcinoma in situ (DCIS) of breast, unspecified laterality, Mixed hyperlipidemia, Elevated liver enzymes, Short-term memory loss, Myalgia due to statin, and Chronic pain of both shoulders were also pertinent to this visit.  HPI Jacqueline Mata presents for follow up on type 2 DM  This visit occurred during the SARS-CoV-2 public health emergency.  Safety protocols were in place, including screening questions prior to the visit, additional usage of staff PPE, and extensive cleaning of exam room while observing appropriate contact time as indicated for disinfecting solutions.   Since her last visit she underwent right hip replacement in Feb. She was discharged same day and has rehabbed at home relatively well but continues to be plagued by a tight quadriceps muscle.  Not taking a muscle relaxer because she had an adverse rreaction to 4 mg  tizanidine :  Double vision, "passed out"  For 20 minutes.  She had no relief with a trial of robaxin,  Has not tried a lower dose of tizanidine ,  But wants to try it at night at a lower dose.   Insomnia:  Averages 4-5 hours   Initiation usually takes 1.5 hours,  Then wakes up every 2 hours due to joint pain including bilateral shoulder pain with history of prior damage during falls.  has tried 1000 mg tylenol at bedtime   With no improvement in pain.  Left shoulder was injected in December  Seeing Letta Moynahan.  Pain  Recent episode of severe biceps pain , left arm,  In January that occurred while pulling on a towel that had gotten anchored by her foot.  The pain was accompanied by bruising of the bicep   Want to see Charlann Boxer   Right eye stye started on top lid,  Now present on bottom .  Using blephadex wipes .  Has been present for 3 weeks on bottom  lid  Needs preservative free eye ointment  Getting a new partial for the front teeth .  The cost is prohibitive   Lab Results  Component Value Date   HGBA1C 6.5 10/31/2020   Sess podiatry adam McDonald had procedure last year to remedy claw toes.   Using claritin once daily , nose still drips  Advised to try bid dosing       Outpatient Medications Prior to Visit  Medication Sig Dispense Refill  . acetaminophen (TYLENOL) 500 MG tablet Take 1,000 mg by mouth every 6 (six) hours as needed for moderate pain.    Marland Kitchen amoxicillin (AMOXIL) 500 MG capsule Take 2,000 mg by mouth See admin instructions. Dental procedures. Takes $RemoveBeforeDEI'2000mg'[id number]$  one hour prior to dental procedures.    . Blood Glucose Monitoring Suppl KIT by Does not apply route.    . Cholecalciferol (VITAMIN D) 50 MCG (2000 UT) tablet Take 2,000 Units by mouth daily.    . folic acid (FOLVITE) 096 MCG tablet Take 800 mcg by mouth daily.    Marland Kitchen glucose blood (ACCU-CHEK AVIVA PLUS) test strip Use to check blood sugar once daily. 100 each 5  . Lancets (ACCU-CHEK MULTICLIX) lancets Use once daily to check blood sugars   Dx:E11.9 100 each 3  . levothyroxine (SYNTHROID) 88 MCG tablet TAKE 1 TABLET BY MOUTH DAILY BEFORE BREAKFAST 90 tablet 0  . loratadine (CLARITIN) 10 MG tablet Take  10 mg by mouth daily.    . metronidazole (NORITATE) 1 % cream Apply 1 application topically in the morning and at bedtime.    . Probiotic Product (PROBIOTIC DAILY PO) Take 1 capsule by mouth daily.    . methocarbamol (ROBAXIN) 500 MG tablet TAKE 1 TABLET BY MOUTH EVERY 6 HOURS AS NEEDED FOR PAIN OR SPASM (Patient not taking: Reported on 01/02/2021)    . tiZANidine (ZANAFLEX) 4 MG tablet Take 1 tablet (4 mg total) by mouth every 6 (six) hours as needed for muscle spasms. (Patient not taking: Reported on 01/02/2021) 40 tablet 1   No facility-administered medications prior to visit.    Review of Systems;  Patient denies headache, fevers, malaise, unintentional weight loss,  skin rash, eye pain, sinus congestion and sinus pain, sore throat, dysphagia,  hemoptysis , cough, dyspnea, wheezing, chest pain, palpitations, orthopnea, edema, abdominal pain, nausea, melena, diarrhea, constipation, flank pain, dysuria, hematuria, urinary  Frequency, nocturia, numbness, tingling, seizures,  Focal weakness, Loss of consciousness,  Tremor, insomnia, depression, anxiety, and suicidal ideation.      Objective:  BP 124/78 (BP Location: Left Arm, Patient Position: Sitting, Cuff Size: Normal)   Pulse 90   Temp 98.4 F (36.9 C) (Oral)   Resp 16   Ht 5' 3.5" (1.613 m)   Wt 171 lb 12.8 oz (77.9 kg)   SpO2 97%   BMI 29.96 kg/m   BP Readings from Last 3 Encounters:  01/02/21 124/78  11/12/20 (!) 156/82  11/05/20 (!) 144/89    Wt Readings from Last 3 Encounters:  01/02/21 171 lb 12.8 oz (77.9 kg)  11/12/20 175 lb 12.8 oz (79.7 kg)  11/04/20 175 lb 12.8 oz (79.7 kg)    General appearance: alert, cooperative and appears stated age Ears: normal TM's and external ear canals both ears Throat: lips, mucosa, and tongue normal; teeth and gums normal Neck: no adenopathy, no carotid bruit, supple, symmetrical, trachea midline and thyroid not enlarged, symmetric, no tenderness/mass/nodules Back: symmetric, no curvature. ROM normal. No CVA tenderness. Lungs: clear to auscultation bilaterally Heart: regular rate and rhythm, S1, S2 normal, no murmur, click, rub or gallop Abdomen: soft, non-tender; bowel sounds normal; no masses,  no organomegaly Pulses: 2+ and symmetric Skin: Skin color, texture, turgor normal. No rashes or lesions Lymph nodes: Cervical, supraclavicular, and axillary nodes normal.  Lab Results  Component Value Date   HGBA1C 6.5 10/31/2020   HGBA1C 7.5 (H) 07/19/2020   HGBA1C 7.2 (H) 04/15/2020    Lab Results  Component Value Date   CREATININE 0.91 12/31/2020   CREATININE 0.97 11/05/2020   CREATININE 0.97 10/31/2020    Lab Results  Component Value Date    WBC 8.4 12/31/2020   HGB 13.3 12/31/2020   HCT 39.2 12/31/2020   PLT 194 12/31/2020   GLUCOSE 120 (H) 12/31/2020   CHOL 257 (H) 10/31/2020   TRIG 101.0 10/31/2020   HDL 80.30 10/31/2020   LDLDIRECT 122.0 07/01/2017   LDLCALC 157 (H) 10/31/2020   ALT 19 12/31/2020   AST 17 12/31/2020   NA 138 12/31/2020   K 4.1 12/31/2020   CL 104 12/31/2020   CREATININE 0.91 12/31/2020   BUN 19 12/31/2020   CO2 26 12/31/2020   TSH 2.59 10/31/2020   INR 1.1 11/05/2020   HGBA1C 6.5 10/31/2020   MICROALBUR <0.7 04/15/2020    No results found.  Assessment & Plan:   Problem List Items Addressed This Visit      Unprioritized   Breast cancer  in situ   Diabetes mellitus type 2, diet-controlled ([place]) - Primary    She continues to have excellent control with diet alone.   Patient is up-to-date on eye exams and foot exam is normal today.  Patient has no history of  microalbuminuria . Patient is intolerant of statin therapy for CAD risk reduction.  Lab Results  Component Value Date   HGBA1C 6.5 10/31/2020   Lab Results  Component Value Date   MICROALBUR <0.7 04/15/2020           Relevant Orders   Hemoglobin A1c   Lipid panel   Comprehensive metabolic panel   Elevated liver enzymes   Hyperlipidemia    Untreated secondary to statin myalgia.  She has no known history of atherosclerosis, CAD or PAD.     Lab Results  Component Value Date   CHOL 257 (H) 10/31/2020   HDL 80.30 10/31/2020   LDLCALC 157 (H) 10/31/2020   LDLDIRECT 122.0 07/01/2017   TRIG 101.0 10/31/2020   CHOLHDL 3 10/31/2020         Myalgia due to statin    She has a history of myalgias resolved with suspension of statin        Short-term memory loss   Relevant Orders   TSH   Shoulder pain, bilateral    She is having chronic constant pain in both shoulders due to prior injuries and has had prior injections.  She will follow up with Dr Charlann Boxer           I provided  30 minutes of  face-to-face time  during this encounter reviewing patient's current problems and past surgeries, labs and imaging studies, providing counseling on the above mentioned problems , and coordination  of care . I have discontinued Grayland Ormond. Krasowski's tiZANidine and methocarbamol. I am also having her start on erythromycin. Additionally, I am having her maintain her Blood Glucose Monitoring Suppl, accu-chek multiclix, glucose blood, folic acid, amoxicillin, loratadine, acetaminophen, Probiotic Product (PROBIOTIC DAILY PO), metronidazole, Vitamin D, and levothyroxine.  Meds ordered this encounter  Medications  . erythromycin ophthalmic ointment    Sig: Place 1 application into the right eye at bedtime. MUST BE PRESERVATIVE FREE .  DO NOT FILL OTHERWISE    Dispense:  3.5 g    Refill:  0    Medications Discontinued During This Encounter  Medication Reason  . methocarbamol (ROBAXIN) 500 MG tablet   . tiZANidine (ZANAFLEX) 4 MG tablet     Follow-up: Return in about 4 months (around 05/12/2021) for follow up diabetes.   [name], MD

## 2021-01-02 NOTE — Patient Instructions (Addendum)
You are doing well!   Your A1c is excellent!   Increase claritin to twice daily for your allergies  Tizanidine 2 mg at bedtime trial:  Call if 4 mg tabls cannot be cut.  Continue 1000 mg tylenol at bedtime for pain    Talk to Dr Tamala Julian about the quad and the shoulder    Erythromycin eye ointment for the stye  See you In August

## 2021-01-03 DIAGNOSIS — Z9889 Other specified postprocedural states: Secondary | ICD-10-CM | POA: Diagnosis not present

## 2021-01-05 NOTE — Assessment & Plan Note (Signed)
She is having chronic constant pain in both shoulders due to prior injuries and has had prior injections.  She will follow up with Dr Charlann Boxer

## 2021-01-05 NOTE — Assessment & Plan Note (Signed)
She has a history of myalgias resolved with suspension of statin

## 2021-01-05 NOTE — Assessment & Plan Note (Signed)
Untreated secondary to statin myalgia.  She has no known history of atherosclerosis, CAD or PAD.     Lab Results  Component Value Date   CHOL 257 (H) 10/31/2020   HDL 80.30 10/31/2020   LDLCALC 157 (H) 10/31/2020   LDLDIRECT 122.0 07/01/2017   TRIG 101.0 10/31/2020   CHOLHDL 3 10/31/2020

## 2021-01-05 NOTE — Assessment & Plan Note (Signed)
She continues to have excellent control with diet alone.   Patient is up-to-date on eye exams and foot exam is normal today.  Patient has no history of  microalbuminuria . Patient is intolerant of statin therapy for CAD risk reduction.  Lab Results  Component Value Date   HGBA1C 6.5 10/31/2020   Lab Results  Component Value Date   MICROALBUR <0.7 04/15/2020

## 2021-01-07 NOTE — Progress Notes (Signed)
Jacqueline Mata Phone: [phone] Subjective:   Fontaine No, am serving as a scribe for Dr. Hulan Saas. This visit occurred during the SARS-CoV-2 public health emergency.  Safety protocols were in place, including screening questions prior to the visit, additional usage of staff PPE, and extensive cleaning of exam room while observing appropriate contact time as indicated for disinfecting solutions.   I'm seeing this patient by the request  of:  Jacqueline Mc, MD  CC: Arm pain and shoulder pain  PYP:[id number]  Jacqueline Mata is a 80 y.o. female coming in with complaint of B shoulder pain. Last seen for R hip arthritis in December 2021. Was seen for cervical radiculopathy and B shoulder pain in 2016. Patient states that end of December 2020 patient fell and she had R shoulder pain. Patient had MRI in July 2021. Saw Dr. Tamera Punt in August 2021 and he recommended shoulder replacement.   In December 2021 her L shoulder started bothering her due to compensation for R shoulder pain. Dr. Tamera Punt gave patient injection in L shoulder but this did not help. One month later, she was toweling off and internally rotated her L shoulder and she felt sharp pain. Bruising over bicep. Pain with with sleeping on that arm, putting on her bra, and with doing her hair. Very painful when arm comes back down. Denies any radiating symptoms.    MRI R shoulder July 2021 IMPRESSION: 1. Complete tear of supraspinatus and infraspinatus tendons with 3.2 cm of retraction. 2. Moderate tendinosis of the subscapularis tendon with a partial-thickness tear. 3. Mild tendinosis of the intra-articular portion of the long head of the biceps tendon. 4. Moderate-severe osteoarthritis of the glenohumeral joint.  MRI cervical 2016 IMPRESSION: Mild cervical disc and facet degeneration at multiple levels. Mild foraminal narrowing bilaterally due to  bony overgrowth. No focal disc protrusion. The patient has a generous sized spinal canal and there is no spinal stenosis or cord lesion.     Past Medical History:  Diagnosis Date  . Anxiety   . Arthritis   . breast cancer 2005   bilateral  . Cataract 01/04/13 and 03/01/13  . Complication of anesthesia    dizziness   . Diabetes mellitus    pt denies at preop of 11/05/2020   . Hyperlipidemia   . hypothyroidism   . Hypothyroidism   . Personal history of chemotherapy   . Personal history of radiation therapy   . PONV (postoperative nausea and vomiting)   . Pre-diabetes    Past Surgical History:  Procedure Laterality Date  . bilateral knee replacement s    . BREAST BIOPSY Left 2005   positive  . BREAST BIOPSY Right 2005   positive  . BREAST LUMPECTOMY Left 2005  . BREAST LUMPECTOMY Right 2005  . gumm surgery     . JOINT REPLACEMENT  2013   by Dr. Marry Guan  . righ tknee arthroscopy     . TONSILLECTOMY    . TOTAL HIP ARTHROPLASTY Right 11/12/2020   Procedure: RIGHT TOTAL HIP ARTHROPLASTY ANTERIOR APPROACH;  Surgeon: Melrose Nakayama, MD;  Location: [facility];  Service: Orthopedics;  Laterality: Right;   Social History   Socioeconomic History  . Marital status: Divorced    Spouse name: Not on file  . Number of children: Not on file  . Years of education: Not on file  . Highest education level: Not on file  Occupational History  . Not  on file  Tobacco Use  . Smoking status: Former Smoker    Packs/day: 1.00    Years: 19.00    Pack years: 19.00    Types: Cigarettes    Start date: 01/02/1960    Quit date: 07/22/1978    Years since quitting: 42.4  . Smokeless tobacco: Never Used  . Tobacco comment: quit 36 years ago  Vaping Use  . Vaping Use: Never used  Substance and Sexual Activity  . Alcohol use: Yes    Alcohol/week: 0.0 standard drinks    Comment: rare  . Drug use: No  . Sexual activity: Never  Other Topics Concern  . Not on file  Social History Narrative  . Not on  file   Social Determinants of Health   Financial Resource Strain: Low Risk   . Difficulty of Paying Living Expenses: Not hard at all  Food Insecurity: No Food Insecurity  . Worried About Charity fundraiser in the Last Year: Never true  . Ran Out of Food in the Last Year: Never true  Transportation Needs: No Transportation Needs  . Lack of Transportation (Medical): No  . Lack of Transportation (Non-Medical): No  Physical Activity: Not on file  Stress: No Stress Concern Present  . Feeling of Stress : Not at all  Social Connections: Not on file   Allergies  Allergen Reactions  . Codeine Shortness Of Breath and Other (See Comments)    Altered mental staus  . Ilevro [Nepafenac] Other (See Comments)    Eye swelling   . Lamisil [Terbinafine] Rash and Other (See Comments)    Dysphagia and skin fungus  . Mold Extract [Trichophyton Mentagrophyte] Other (See Comments)  . Other Swelling    ILLEVRO (EYE DROPS)- EYE SWELLING  DOGS.Congestion. FEATHERS.Congestion  . Maxitrol [Neomycin-Polymyxin-Dexameth]     Unknown - dry eyes   . Metformin And Related     Shakiness   . Molds & Smuts Other (See Comments)    Congestion.  Marland Kitchen Neomycin Rash  . Neomycin-Bacitracin Zn-Polymyx Rash  . Neomycin-Polymyxin-Hc Rash  . Tape Dermatitis    Blisters  . Tapentadol Nausea And Vomiting and Other (See Comments)    Nucynta  . Terbinafine Rash    Trouble swallowing  . Tramadol Other (See Comments), Nausea Only and Nausea And Vomiting    dizziness   Family History  Problem Relation Age of Onset  . Cancer Sister        lung, former tobacco  . Cancer Brother 67       lung, thyroid, melanoma  . Cancer Mother 15       ovarian  . Diabetes Son   . Breast cancer Maternal Grandfather     Current Outpatient Medications (Endocrine & Metabolic):  .  levothyroxine (SYNTHROID) 88 MCG tablet, TAKE 1 TABLET BY MOUTH DAILY BEFORE BREAKFAST   Current Outpatient Medications (Respiratory):  .   loratadine (CLARITIN) 10 MG tablet, Take 10 mg by mouth daily.  Current Outpatient Medications (Analgesics):  .  acetaminophen (TYLENOL) 500 MG tablet, Take 1,000 mg by mouth every 6 (six) hours as needed for moderate pain.  Current Outpatient Medications (Hematological):  .  folic acid (FOLVITE) 017 MCG tablet, Take 800 mcg by mouth daily.  Current Outpatient Medications (Other):  .  amoxicillin (AMOXIL) 500 MG capsule, Take 2,000 mg by mouth See admin instructions. Dental procedures. Takes $RemoveBeforeDEI'2000mg'QeToKnJbPVJfvArB$  one hour prior to dental procedures. .  Blood Glucose Monitoring Suppl KIT, by Does not apply route. Marland Kitchen  Cholecalciferol (VITAMIN D) 50 MCG (2000 UT) tablet, Take 2,000 Units by mouth daily. Marland Kitchen  erythromycin ophthalmic ointment, Place 1 application into the right eye at bedtime. MUST BE PRESERVATIVE FREE .  DO NOT FILL OTHERWISE .  glucose blood (ACCU-CHEK AVIVA PLUS) test strip, Use to check blood sugar once daily. .  Lancets (ACCU-CHEK MULTICLIX) lancets, Use once daily to check blood sugars   Dx:E11.9 .  metronidazole (NORITATE) 1 % cream, Apply 1 application topically in the morning and at bedtime. .  Probiotic Product (PROBIOTIC DAILY PO), Take 1 capsule by mouth daily.   Reviewed prior external information including notes and imaging from  primary care provider As well as notes that were available from care everywhere and other healthcare systems.  Past medical history, social, surgical and family history all reviewed in electronic medical record.  No pertanent information unless stated regarding to the chief complaint.   Review of Systems:  No headache, visual changes, nausea, vomiting, diarrhea, constipation, dizziness, abdominal pain, skin rash, fevers, chills, night sweats, weight loss, swollen lymph nodes, body aches, , chest pain, shortness of breath, mood changes. POSITIVE muscle aches and mild swelling  Objective  Blood pressure 130/90, pulse (!) 107, height 5' 3.5" (1.613 m), SpO2  98 %.   General: No apparent distress alert and oriented x3 mood and affect normal, dressed appropriately.  HEENT: Pupils equal, extraocular movements intact  Respiratory: Patient's speak in full sentences and does not appear short of breath  Cardiovascular: No lower extremity edema, non tender, no erythema  Gait normal with good balance and coordination.  MSK: Arthritic changes of multiple joints. Right shoulder has significant arthritic changes and decreased range of motion. Left shoulder shows that patient does have some bruising in the mid substance of the biceps tendon noted.  The patient does have mild hardness in this area.  Patient does have elevated positive impingement noted as well.  Patient does have some mild weakness of the left-sided rotator cuff with 4 out of 5 strength noted.  Mild limited range of motion lacking the last 5 degrees of internal and external range of motion.  Limited musculoskeletal ultrasound was performed and interpreted by Lyndal Pulley  Limited ultrasound shows that it does appear that patient's proximal as well as distal bicep tendons do seem to be intact.  Patient does have a mid substance tear of the bicep muscle noted.  Does show some mild hypoechoic changes that is likely still some blood in the area.  No abnormal masses appreciated though. Patient does have what appears to be a chronic rotator cuff tear potentially of this left shoulder.  Mild retraction of the supraspinatus noted.  Narrowing of the glenohumeral joint noted. Impression: Acute bicep muscle tear with underlying mild to moderate rotator cuff arthropathy    Impression and Recommendations:     The above documentation has been reviewed and is accurate and complete Lyndal Pulley, DO

## 2021-01-08 ENCOUNTER — Ambulatory Visit: Payer: PPO | Admitting: Family Medicine

## 2021-01-08 ENCOUNTER — Encounter: Payer: Self-pay | Admitting: Family Medicine

## 2021-01-08 ENCOUNTER — Ambulatory Visit: Payer: Self-pay

## 2021-01-08 ENCOUNTER — Other Ambulatory Visit: Payer: Self-pay

## 2021-01-08 ENCOUNTER — Ambulatory Visit (INDEPENDENT_AMBULATORY_CARE_PROVIDER_SITE_OTHER): Payer: PPO

## 2021-01-08 VITALS — BP 130/90 | HR 107 | Ht 63.5 in

## 2021-01-08 DIAGNOSIS — S46212A Strain of muscle, fascia and tendon of other parts of biceps, left arm, initial encounter: Secondary | ICD-10-CM

## 2021-01-08 DIAGNOSIS — M25512 Pain in left shoulder: Secondary | ICD-10-CM | POA: Diagnosis not present

## 2021-01-08 DIAGNOSIS — G8929 Other chronic pain: Secondary | ICD-10-CM

## 2021-01-08 HISTORY — DX: Strain of muscle, fascia and tendon of other parts of biceps, left arm, initial encounter: S46.212A

## 2021-01-08 NOTE — Assessment & Plan Note (Signed)
Patient seems to have more of a tear of the bicep muscle itself.  It appears that the proximal as well as the distal bicep tendons are intact on ultrasound today.  Patient does have what appears to be an acute on chronic rotator cuff tear noted as well.  Is been contributing to some of the discomfort and pain as well.  Patient unfortunately needs a knee replacement on the contralateral side so I will hold on any MRI of the shoulders because I do not think it would change our management with patient likely not being able.  Doing well with a rotator cuff repair secondary to the arthritis on the contralateral side.  We discussed potential formal physical therapy but secondary to co-pay patient would like to see if she can do the exercises on her own.  Discussed compression and icing regimen.  Follow-up with me again in 6 to 8 weeks

## 2021-01-08 NOTE — Patient Instructions (Signed)
Compression sleeve Exercises Xray L shoulder Ice at end of day Arnica lotion for bruising See me in 6-7 weeks Write [place] if you want to see physical therapy

## 2021-01-22 DIAGNOSIS — M5416 Radiculopathy, lumbar region: Secondary | ICD-10-CM | POA: Diagnosis not present

## 2021-01-22 DIAGNOSIS — M62830 Muscle spasm of back: Secondary | ICD-10-CM | POA: Diagnosis not present

## 2021-01-22 DIAGNOSIS — M9901 Segmental and somatic dysfunction of cervical region: Secondary | ICD-10-CM | POA: Diagnosis not present

## 2021-01-22 DIAGNOSIS — M9903 Segmental and somatic dysfunction of lumbar region: Secondary | ICD-10-CM | POA: Diagnosis not present

## 2021-01-29 ENCOUNTER — Other Ambulatory Visit: Payer: Self-pay

## 2021-01-29 ENCOUNTER — Ambulatory Visit: Payer: PPO | Admitting: Podiatry

## 2021-01-29 ENCOUNTER — Encounter: Payer: Self-pay | Admitting: Podiatry

## 2021-01-29 DIAGNOSIS — M2041 Other hammer toe(s) (acquired), right foot: Secondary | ICD-10-CM | POA: Diagnosis not present

## 2021-01-29 DIAGNOSIS — M2042 Other hammer toe(s) (acquired), left foot: Secondary | ICD-10-CM | POA: Diagnosis not present

## 2021-01-31 NOTE — Progress Notes (Signed)
  Subjective:  Patient ID: Jacqueline Mata, female    DOB: [birth year],  MRN: [id number]  Chief Complaint  Patient presents with  . Hammer Toe    80 y.o. female returns with the above complaint. History confirmed with patient. Doing well. Minimal pain  Objective:  Physical Exam: warm, good capillary refill, no trophic changes or ulcerative lesions, normal DP and PT pulses and normal sensory exam.  Dystrophic toenails present with elongated nail plates    Right Foot: Third toe contracture has improved significantly with no distal tip callus  Left foot: Toes in much better position with reduction of hammertoe contractures, adductovarus component of fifth toe still present  X-rays of both feet: Adductovarus hammertoe contractures 2 through 5 bilaterally Assessment:   1. Hammertoe of left foot   2. Hammertoe of right foot      Plan:  Patient was evaluated and treated and all questions answered.  Hammertoe left foot second third fourth and fifth toes Toes continue to be comfortable overall Return PRN if other toes bother her   Return if symptoms worsen or fail to improve.

## 2021-02-05 DIAGNOSIS — M1612 Unilateral primary osteoarthritis, left hip: Secondary | ICD-10-CM | POA: Diagnosis not present

## 2021-02-05 DIAGNOSIS — Z9889 Other specified postprocedural states: Secondary | ICD-10-CM | POA: Diagnosis not present

## 2021-02-05 DIAGNOSIS — Z961 Presence of intraocular lens: Secondary | ICD-10-CM | POA: Diagnosis not present

## 2021-02-05 DIAGNOSIS — Z96641 Presence of right artificial hip joint: Secondary | ICD-10-CM | POA: Diagnosis not present

## 2021-02-05 LAB — HM DIABETES EYE EXAM

## 2021-02-07 NOTE — Telephone Encounter (Signed)
error 

## 2021-02-10 ENCOUNTER — Telehealth (INDEPENDENT_AMBULATORY_CARE_PROVIDER_SITE_OTHER): Payer: PPO | Admitting: Adult Health

## 2021-02-10 ENCOUNTER — Encounter: Payer: Self-pay | Admitting: Adult Health

## 2021-02-10 VITALS — Ht 63.5 in | Wt 170.0 lb

## 2021-02-10 DIAGNOSIS — K0889 Other specified disorders of teeth and supporting structures: Secondary | ICD-10-CM | POA: Diagnosis not present

## 2021-02-10 DIAGNOSIS — J0140 Acute pansinusitis, unspecified: Secondary | ICD-10-CM | POA: Diagnosis not present

## 2021-02-10 MED ORDER — AMOXICILLIN-POT CLAVULANATE 875-125 MG PO TABS: 1.0000 | ORAL_TABLET | Freq: Two times a day (BID) | ORAL | 0 refills | Status: DC

## 2021-02-10 NOTE — Progress Notes (Signed)
Virtual Visit via Telephone Note  I connected with Jacqueline Mata on 02/10/21 at  9:30 AM EDT by telephone and verified that I am speaking with the correct person using two identifiers. Parties involved in visit as below:   Location: Patient: at home  Provider: Provider: Provider's office at  [facility], [place].      I discussed the limitations, risks, security and privacy concerns of performing an evaluation and management service by telephone and the availability of in person appointments. I also discussed with the patient that there may be a patient responsible charge related to this service. The patient expressed understanding and agreed to proceed.   History of Present Illness:  Patient is calling with dental pain generalized, has yellow nasal congestion. She has taken Mucinex.  She has been having dental work and crowns done for the past few months,. She is getting crowns and bridges.  Pain not associated with the area they are working on it is generalized.   She did not take her prophylactic antibiotic as prescribed- orthopedic advised her to take.  Temperature 99.   Patient  denies any body aches,chills, rash, chest pain, shortness of breath, nausea, vomiting, or diarrhea.   Denies dizziness, lightheadedness, pre syncopal or syncopal episodes.    Observations/Objective:   Patient is alert and oriented and responsive to questions Engages in conversation with provider. Speaks in full sentences without any pauses without any shortness of breath or distress.   Assessment and Plan:  1. Acute non-recurrent pansinusitis Will send in as below fort possible bacterial sinusitis versus dental infection.  - amoxicillin-clavulanate (AUGMENTIN) 875-125 MG tablet; Take 1 tablet by mouth 2 (two) times daily.  Dispense: 20 tablet; Refill: 0  2. Pain, dental She does have generalized dental pain, she did not take her prophylactic Amoxicillin as  prescribed by her orthopedic MD prior to her dental work. She does have a follow up  With her dentist in the next 1 week. Advised to keep and call sooner if dental issues worsening.  - amoxicillin-clavulanate (AUGMENTIN) 875-125 MG tablet; Take 1 tablet by mouth 2 (two) times daily.  Dispense: 20 tablet; Refill: 0  Follow Up Instructions:   Advised in person evaluation at anytime is advised if any symptoms do not improve, worsen or change at any given time.  Red Flags discussed. The patient was given clear instructions to go to ER or return to medical center if any red flags develop, symptoms do not improve, worsen or new problems develop. They verbalized understanding. I discussed the assessment and treatment plan with the patient. The patient was provided an opportunity to ask questions and all were answered. The patient agreed with the plan and demonstrated an understanding of the instructions.   The patient was advised to call back or seek an in-person evaluation if the symptoms worsen or if the condition fails to improve as anticipated.  I spent 25 minutes dedicated to the care of this patient on  the date of this encounter to include pre-visit review of records, face-to-face time with the  patient discussing The primary encounter diagnosis was Acute non-recurrent pansinusitis. A diagnosis of Pain, dental was also pertinent to this visit.  Medication presribed and advice given.   Marcille Buffy, FNP

## 2021-02-11 ENCOUNTER — Encounter: Payer: Self-pay | Admitting: Internal Medicine

## 2021-02-11 NOTE — Progress Notes (Signed)
Eye Exam abstracted no retinopathy.

## 2021-02-12 DIAGNOSIS — M9903 Segmental and somatic dysfunction of lumbar region: Secondary | ICD-10-CM | POA: Diagnosis not present

## 2021-02-12 DIAGNOSIS — M9901 Segmental and somatic dysfunction of cervical region: Secondary | ICD-10-CM | POA: Diagnosis not present

## 2021-02-12 DIAGNOSIS — M5416 Radiculopathy, lumbar region: Secondary | ICD-10-CM | POA: Diagnosis not present

## 2021-02-12 DIAGNOSIS — M62830 Muscle spasm of back: Secondary | ICD-10-CM | POA: Diagnosis not present

## 2021-02-19 NOTE — Progress Notes (Signed)
Jacqueline Mata [address] [place] Phone: [phone] Subjective:   I Jacqueline Mata am serving as a Education administrator for Dr. Hulan Mata.  This visit occurred during the SARS-CoV-2 public health emergency.  Safety protocols were in place, including screening questions prior to the visit, additional usage of staff PPE, and extensive cleaning of exam room while observing appropriate contact time as indicated for disinfecting solutions.   I'm seeing this patient by the request  of:  Jacqueline Mc, MD  CC: left shoulder pain   LHT:[id number]   01/08/2021 Patient seems to have more of a tear of the bicep muscle itself.  It appears that the proximal as well as the distal bicep tendons are intact on ultrasound today.  Patient does have what appears to be an acute on chronic rotator cuff tear noted as well.  Is been contributing to some of the discomfort and pain as well.  Patient unfortunately needs a knee replacement on the contralateral side so I will hold on any MRI of the shoulders because I do not think it would change our management with patient likely not being able.  Doing well with a rotator cuff repair secondary to the arthritis on the contralateral side.  We discussed potential formal physical therapy but secondary to co-pay patient would like to see if she can do the exercises on her own.  Discussed compression and icing regimen.  Follow-up with me again in 6 to 8 weeks  Update 02/20/2021 Jacqueline Mata is a 80 y.o. female coming in with complaint of L shoulder pain.  Patient was found to have subacute tear and tendon tear previously.  Patient was to continue conservative therapy.  Patient states the shoulder is a little better than it was. Not sure if it is much better.  Patient is trying to stay active but finds it somewhat difficult.  Patient has been doing some of the exercises but noticed some of them seem to make it worse.  Xray L shoulder  01/08/2021 Negative       Past Medical History:  Diagnosis Date  . Anxiety   . Arthritis   . breast cancer 2005   bilateral  . Cataract 01/04/13 and 03/01/13  . Complication of anesthesia    dizziness   . Diabetes mellitus    pt denies at preop of 11/05/2020   . Hyperlipidemia   . hypothyroidism   . Hypothyroidism   . Personal history of chemotherapy   . Personal history of radiation therapy   . PONV (postoperative nausea and vomiting)   . Pre-diabetes    Past Surgical History:  Procedure Laterality Date  . bilateral knee replacement s    . BREAST BIOPSY Left 2005   positive  . BREAST BIOPSY Right 2005   positive  . BREAST LUMPECTOMY Left 2005  . BREAST LUMPECTOMY Right 2005  . gumm surgery     . JOINT REPLACEMENT  2013   by Dr. Marry Mata  . righ tknee arthroscopy     . TONSILLECTOMY    . TOTAL HIP ARTHROPLASTY Right 11/12/2020   Procedure: RIGHT TOTAL HIP ARTHROPLASTY ANTERIOR APPROACH;  Surgeon: Melrose Nakayama, MD;  Location: [facility];  Service: Orthopedics;  Laterality: Right;   Social History   Socioeconomic History  . Marital status: Divorced    Spouse name: Not on file  . Number of children: Not on file  . Years of education: Not on file  . Highest education level:  Not on file  Occupational History  . Not on file  Tobacco Use  . Smoking status: Former Smoker    Packs/day: 1.00    Years: 19.00    Pack years: 19.00    Types: Cigarettes    Start date: 01/02/1960    Quit date: 07/22/1978    Years since quitting: 42.6  . Smokeless tobacco: Never Used  . Tobacco comment: quit 36 years ago  Vaping Use  . Vaping Use: Never used  Substance and Sexual Activity  . Alcohol use: Yes    Alcohol/week: 0.0 standard drinks    Comment: rare  . Drug use: No  . Sexual activity: Never  Other Topics Concern  . Not on file  Social History Narrative  . Not on file   Social Determinants of Health   Financial Resource Strain: Low Risk   . Difficulty of Paying Living  Expenses: Not hard at all  Food Insecurity: No Food Insecurity  . Worried About Charity fundraiser in the Last Year: Never true  . Ran Out of Food in the Last Year: Never true  Transportation Needs: No Transportation Needs  . Lack of Transportation (Medical): No  . Lack of Transportation (Non-Medical): No  Physical Activity: Not on file  Stress: No Stress Concern Present  . Feeling of Stress : Not at all  Social Connections: Not on file   Allergies  Allergen Reactions  . Codeine Shortness Of Breath and Other (See Comments)    Altered mental staus  . Ilevro [Nepafenac] Other (See Comments)    Eye swelling   . Lamisil [Terbinafine] Rash and Other (See Comments)    Dysphagia and skin fungus  . Mold Extract [Trichophyton Mentagrophyte] Other (See Comments)  . Other Swelling    ILLEVRO (EYE DROPS)- EYE SWELLING  DOGS.Congestion. FEATHERS.Congestion  . Maxitrol [Neomycin-Polymyxin-Dexameth]     Unknown - dry eyes   . Metformin And Related     Shakiness   . Molds & Smuts Other (See Comments)    Congestion.  Marland Kitchen Neomycin Rash  . Neomycin-Bacitracin Zn-Polymyx Rash  . Neomycin-Polymyxin-Hc Rash  . Tape Dermatitis    Blisters  . Tapentadol Nausea And Vomiting and Other (See Comments)    Nucynta  . Terbinafine Rash    Trouble swallowing  . Tramadol Other (See Comments), Nausea Only and Nausea And Vomiting    dizziness   Family History  Problem Relation Age of Onset  . Cancer Sister        lung, former tobacco  . Cancer Brother 39       lung, thyroid, melanoma  . Cancer Mother 52       ovarian  . Diabetes Son   . Breast cancer Maternal Grandfather     Current Outpatient Medications (Endocrine & Metabolic):  .  levothyroxine (SYNTHROID) 88 MCG tablet, TAKE 1 TABLET BY MOUTH DAILY BEFORE BREAKFAST   Current Outpatient Medications (Respiratory):  .  loratadine (CLARITIN) 10 MG tablet, Take 10 mg by mouth daily.  Current Outpatient Medications (Analgesics):  .   acetaminophen (TYLENOL) 500 MG tablet, Take 1,000 mg by mouth every 6 (six) hours as needed for moderate pain.  Current Outpatient Medications (Hematological):  .  folic acid (FOLVITE) 846 MCG tablet, Take 800 mcg by mouth daily.  Current Outpatient Medications (Other):  .  amoxicillin (AMOXIL) 500 MG capsule, Take 2,000 mg by mouth See admin instructions. Dental procedures. Takes $RemoveBeforeDEI'2000mg'jeujSgnXaekrHatP$  one hour prior to dental procedures. Marland Kitchen  amoxicillin-clavulanate (AUGMENTIN) 875-125  MG tablet, Take 1 tablet by mouth 2 (two) times daily. .  Blood Glucose Monitoring Suppl KIT, by Does not apply route. .  Cholecalciferol (VITAMIN D) 50 MCG (2000 UT) tablet, Take 2,000 Units by mouth daily. Marland Kitchen  erythromycin ophthalmic ointment, Place 1 application into the right eye at bedtime. MUST BE PRESERVATIVE FREE .  DO NOT FILL OTHERWISE (Patient taking differently: Place 1 application into the right eye at bedtime. MUST BE PRESERVATIVE FREE .  DO NOT FILL OTHERWISE) .  glucose blood (ACCU-CHEK AVIVA PLUS) test strip, Use to check blood sugar once daily. .  Lancets (ACCU-CHEK MULTICLIX) lancets, Use once daily to check blood sugars   Dx:E11.9 .  metronidazole (NORITATE) 1 % cream, Apply 1 application topically in the morning and at bedtime. .  Probiotic Product (PROBIOTIC DAILY PO), Take 1 capsule by mouth daily.   Reviewed prior external information including notes and imaging from  primary care provider As well as notes that were available from care everywhere and other healthcare systems.  Past medical history, social, surgical and family history all reviewed in electronic medical record.  No pertanent information unless stated regarding to the chief complaint.   Review of Systems:POSITIVE muscle aches  Objective  Blood pressure 120/86, pulse 91, height 5' 3.5" (1.613 m), weight 175 lb (79.4 kg), SpO2 99 %.   General: No apparent distress alert and oriented x3 mood and affect normal, dressed appropriately.   HEENT: Pupils equal, extraocular movements intact  Respiratory: Patient's speak in full sentences and does not appear short of breath  Cardiovascular: No lower extremity edema, non tender, no erythema  Gait mildly antalgic MSK: Patient's left shoulder patient is wearing a compression sleeve on the bicep.  Patient has less pain with speeds.  The patient does have now what appears to be weakness with empty can and mild positive drop arm test on the left side.  Patient does have some mild limited external rotation of the shoulder.  Limited musculoskeletal ultrasound was performed and interpreted by Lyndal Pulley  Limited ultrasound shows the patient has significant hypoechoic changes in both the tendons of the long and short head of the bicep.  Difficulty can see the tendon at this juncture.  Patient also has significant hypoechoic changes within the glenohumeral area in the subacromial space.  Patient has what appears to be a very small partial interstitial tear of the subscapularis and a potential acute on chronic tear with mild retraction noted of the supraspinatus.  Difficult to assess completely secondary to the amount of hypoechoic changes. Impression: Acute on chronic rotator cuff tear with potential bicep pathology.   Impression and Recommendations:     The above documentation has been reviewed and is accurate and complete Lyndal Pulley, DO

## 2021-02-20 ENCOUNTER — Ambulatory Visit: Payer: Self-pay

## 2021-02-20 ENCOUNTER — Encounter: Payer: Self-pay | Admitting: Family Medicine

## 2021-02-20 ENCOUNTER — Ambulatory Visit: Payer: PPO | Admitting: Family Medicine

## 2021-02-20 ENCOUNTER — Other Ambulatory Visit: Payer: Self-pay

## 2021-02-20 VITALS — BP 120/86 | HR 91 | Ht 63.5 in | Wt 175.0 lb

## 2021-02-20 DIAGNOSIS — G8929 Other chronic pain: Secondary | ICD-10-CM

## 2021-02-20 DIAGNOSIS — M25512 Pain in left shoulder: Secondary | ICD-10-CM | POA: Diagnosis not present

## 2021-02-20 NOTE — Patient Instructions (Addendum)
Good to see you MRA of the left shoulder We will talk soon

## 2021-02-25 ENCOUNTER — Encounter: Payer: Self-pay | Admitting: Family Medicine

## 2021-03-05 DIAGNOSIS — M5416 Radiculopathy, lumbar region: Secondary | ICD-10-CM | POA: Diagnosis not present

## 2021-03-05 DIAGNOSIS — M9901 Segmental and somatic dysfunction of cervical region: Secondary | ICD-10-CM | POA: Diagnosis not present

## 2021-03-05 DIAGNOSIS — M9903 Segmental and somatic dysfunction of lumbar region: Secondary | ICD-10-CM | POA: Diagnosis not present

## 2021-03-05 DIAGNOSIS — M62830 Muscle spasm of back: Secondary | ICD-10-CM | POA: Diagnosis not present

## 2021-03-12 ENCOUNTER — Ambulatory Visit: Payer: PPO

## 2021-03-24 DIAGNOSIS — C50912 Malignant neoplasm of unspecified site of left female breast: Secondary | ICD-10-CM | POA: Diagnosis not present

## 2021-03-24 DIAGNOSIS — C50911 Malignant neoplasm of unspecified site of right female breast: Secondary | ICD-10-CM | POA: Diagnosis not present

## 2021-03-26 ENCOUNTER — Ambulatory Visit
Admission: RE | Admit: 2021-03-26 | Discharge: 2021-03-26 | Disposition: A | Discharge status: Home / Self Care | Payer: PPO | Source: Ambulatory Visit | Attending: Family Medicine | Admitting: Family Medicine

## 2021-03-26 ENCOUNTER — Other Ambulatory Visit: Payer: Self-pay

## 2021-03-26 DIAGNOSIS — X58XXXA Exposure to other specified factors, initial encounter: Secondary | ICD-10-CM | POA: Diagnosis not present

## 2021-03-26 DIAGNOSIS — M5416 Radiculopathy, lumbar region: Secondary | ICD-10-CM | POA: Diagnosis not present

## 2021-03-26 DIAGNOSIS — M25512 Pain in left shoulder: Secondary | ICD-10-CM | POA: Insufficient documentation

## 2021-03-26 DIAGNOSIS — M9903 Segmental and somatic dysfunction of lumbar region: Secondary | ICD-10-CM | POA: Diagnosis not present

## 2021-03-26 DIAGNOSIS — M62830 Muscle spasm of back: Secondary | ICD-10-CM | POA: Diagnosis not present

## 2021-03-26 DIAGNOSIS — M75122 Complete rotator cuff tear or rupture of left shoulder, not specified as traumatic: Secondary | ICD-10-CM | POA: Diagnosis not present

## 2021-03-26 DIAGNOSIS — M9901 Segmental and somatic dysfunction of cervical region: Secondary | ICD-10-CM | POA: Diagnosis not present

## 2021-03-26 DIAGNOSIS — S46812A Strain of other muscles, fascia and tendons at shoulder and upper arm level, left arm, initial encounter: Secondary | ICD-10-CM | POA: Insufficient documentation

## 2021-03-26 DIAGNOSIS — M7582 Other shoulder lesions, left shoulder: Secondary | ICD-10-CM | POA: Insufficient documentation

## 2021-03-26 DIAGNOSIS — G8929 Other chronic pain: Secondary | ICD-10-CM | POA: Diagnosis not present

## 2021-03-26 MED ORDER — SODIUM CHLORIDE (PF) 0.9 % IJ SOLN
10.0000 mL | INTRAMUSCULAR | Status: DC | PRN
  Administered 2021-03-26: 10 mL

## 2021-03-26 MED ORDER — IOHEXOL 180 MG/ML  SOLN
20.0000 mL | Freq: Once | INTRAMUSCULAR | Status: AC | PRN
  Administered 2021-03-26: 20 mL

## 2021-03-26 MED ORDER — GADOBUTROL 1 MMOL/ML IV SOLN
0.0500 mL | Freq: Once | INTRAVENOUS | Status: AC | PRN
  Administered 2021-03-26: 0.05 mL

## 2021-03-26 MED ORDER — LIDOCAINE HCL (PF) 1 % IJ SOLN
5.0000 mL | Freq: Once | INTRAMUSCULAR | Status: AC
  Administered 2021-03-26: 5 mL via INTRADERMAL
  Filled 2021-03-26: qty 5

## 2021-03-27 ENCOUNTER — Other Ambulatory Visit: Payer: Self-pay

## 2021-03-27 ENCOUNTER — Encounter: Payer: Self-pay | Admitting: Family Medicine

## 2021-03-27 DIAGNOSIS — M25512 Pain in left shoulder: Secondary | ICD-10-CM

## 2021-04-02 ENCOUNTER — Other Ambulatory Visit: Payer: Self-pay | Admitting: Internal Medicine

## 2021-04-04 DIAGNOSIS — M75122 Complete rotator cuff tear or rupture of left shoulder, not specified as traumatic: Secondary | ICD-10-CM | POA: Diagnosis not present

## 2021-04-10 ENCOUNTER — Encounter: Payer: Self-pay | Admitting: Family Medicine

## 2021-04-16 DIAGNOSIS — M5416 Radiculopathy, lumbar region: Secondary | ICD-10-CM | POA: Diagnosis not present

## 2021-04-16 DIAGNOSIS — M62830 Muscle spasm of back: Secondary | ICD-10-CM | POA: Diagnosis not present

## 2021-04-16 DIAGNOSIS — M9903 Segmental and somatic dysfunction of lumbar region: Secondary | ICD-10-CM | POA: Diagnosis not present

## 2021-04-16 DIAGNOSIS — M9901 Segmental and somatic dysfunction of cervical region: Secondary | ICD-10-CM | POA: Diagnosis not present

## 2021-04-25 ENCOUNTER — Ambulatory Visit: Payer: PPO | Attending: Surgical | Admitting: Physical Therapy

## 2021-04-25 ENCOUNTER — Encounter: Payer: Self-pay | Admitting: Physical Therapy

## 2021-04-25 ENCOUNTER — Other Ambulatory Visit: Payer: Self-pay

## 2021-04-25 DIAGNOSIS — M25512 Pain in left shoulder: Secondary | ICD-10-CM | POA: Diagnosis not present

## 2021-04-25 DIAGNOSIS — G8929 Other chronic pain: Secondary | ICD-10-CM | POA: Insufficient documentation

## 2021-04-25 DIAGNOSIS — M6281 Muscle weakness (generalized): Secondary | ICD-10-CM | POA: Insufficient documentation

## 2021-04-25 NOTE — Therapy (Signed)
[place], [place], [address] Phone: [phone]   Fax:  [phone]  Physical Therapy Evaluation  Patient Details  Name: Jacqueline Mata MRN: [id number] Date of Birth: October 29, 1940 Referring Provider (PT): Sheryle Hail, [place]   Encounter Date: 04/25/2021   PT End of Session - 04/25/21 1004     Visit Number 1    Number of Visits 9    Date for PT Re-Evaluation 06/20/21    Authorization Type [employer]: Kx mod at 15th visit, FOTO 6th and 10th visit.    Progress Note Due on Visit 10    PT Start Time 1015    PT Stop Time 1101    PT Time Calculation (min) 46 min    Activity Tolerance Patient tolerated treatment well    Behavior During Therapy [facility] for tasks assessed/performed             Past Medical History:  Diagnosis Date   Anxiety    Arthritis    breast cancer 2005   bilateral   Cataract 01/04/13 and [id number]   Complication of anesthesia    dizziness    Diabetes mellitus    pt denies at preop of 11/05/2020    Hyperlipidemia    hypothyroidism    Hypothyroidism    Personal history of chemotherapy    Personal history of radiation therapy    PONV (postoperative nausea and vomiting)    Pre-diabetes     Past Surgical History:  Procedure Laterality Date   bilateral knee replacement s     BREAST BIOPSY Left 2005   positive   BREAST BIOPSY Right 2005   positive   BREAST LUMPECTOMY Left 2005   BREAST LUMPECTOMY Right 2005   gumm surgery      JOINT REPLACEMENT  2013   by Dr. Marry Guan   righ tknee arthroscopy      TONSILLECTOMY     TOTAL HIP ARTHROPLASTY Right 11/12/2020   Procedure: RIGHT TOTAL HIP ARTHROPLASTY ANTERIOR APPROACH;  Surgeon: Melrose Nakayama, MD;  Location: [facility];  Service: Orthopedics;  Laterality: Right;    There were no vitals filed for this visit.    Subjective Assessment - 04/25/21 1023     Subjective pt presents with L shoulder pain that happened as result  of a fall in December of 2020 and she was seen for her R shoulder initially and noted having 2 tendons ripping from the bone. The L shoulder she utilized for every and went to see the dr Maylon Cos on 09/17/20 for the L shoulder and got a shot. a month later she was in the shower and was drying her leg and got her arm caught in the towel and felt significant pain and had bruising. AFter that she saw Charlann Boxer and he provided some exercises. She saw the MD a month later and had an ultra sound and recommended an MRI. since onset fo rthe L shoulder pain it has improved alittle bit.  Currentl sees a chiropractor for her back.    Limitations Lifting    How long can you sit comfortably? unlimited    How long can you stand comfortably? unlimited    How long can you walk comfortably? unlimited    Diagnostic tests 03/26/2021 - IMPRESSION:  1. Large full-thickness tear of the anterior half of the  supraspinatus tendon with 8 mm of retraction as well as a high-grade  partial-thickness articular surface tear of the posterior half of  the supraspinatus  tendon.  2. Severe tendinosis of the infraspinatus tendon.  3. Mild tendinosis of the subscapularis tendon with a high-grade  partial-thickness tear.  4. Complete tear of the intra-articular portion of the long head of  the biceps tendon.    Patient Stated Goals to be as functional as possible    Currently in Pain? Yes    Pain Score 0-No pain   at worst 8/10   Pain Location Shoulder    Pain Orientation Left    Pain Descriptors / Indicators Aching;Sore    Pain Type Chronic pain    Pain Onset More than a month ago    Pain Frequency Intermittent    Aggravating Factors  putting on a bra, controlling arm down from a flexed position, reaching backward.    Pain Relieving Factors getting out of position and stop doing what shes doing.    Effect of Pain on Daily Activities limited positional tolerace                [facility] PT Assessment - 04/25/21 0001        Assessment   Medical Diagnosis rotator cuff tear    Referring Provider (PT) Sheryle Hail, PA-C    Onset Date/Surgical Date --   December 2020   Hand Dominance Right    Next MD Visit --   unsure   Prior Therapy yes      Precautions   Precautions None      Restrictions   Weight Bearing Restrictions No      Balance Screen   Has the patient fallen in the past 6 months No      [place] residence    Living Arrangements Alone    Type of [place] to enter    Entrance Stairs-Number of Steps 5    Entrance Stairs-Rails Can reach both    [place] Two level   pt nots she lives on the main level   Alternate Level Stairs-Number of Steps 13    Alternate Level Stairs-Rails Can reach both    Home Equipment Shower seat      Prior Function   Level of Independence Independent with basic ADLs    Vocation Retired    Leisure choir, [employer], going out with friends, walking      Cognition   Overall Cognitive Status Within Functional Limits for tasks assessed      Observation/Other Assessments   Focus on Therapeutic Outcomes (FOTO)  55%   predicted 65%     ROM / Strength   AROM / PROM / Strength AROM;Strength;PROM      AROM   AROM Assessment Site Shoulder    Right/Left Shoulder Right;Left    Right Shoulder Extension 75 Degrees    Right Shoulder Flexion 143 Degrees    Right Shoulder ABduction 152 Degrees    Right Shoulder Internal Rotation --   T9   Right Shoulder External Rotation --   T2   Left Shoulder Extension 74 Degrees   noted pain at end range along the bicep   Left Shoulder Flexion 150 Degrees    Left Shoulder ABduction 160 Degrees   difficulty controlling arm down   Left Shoulder Internal Rotation --   T10   Left Shoulder External Rotation --   T2     PROM   PROM Assessment Site Shoulder    Right/Left Shoulder Left      Strength   Strength  Assessment Site Shoulder    Right/Left Shoulder  Right;Left    Right Shoulder Flexion 4/5    Right Shoulder Extension 5/5    Right Shoulder ABduction 3/5   pain during testing limited strength   Right Shoulder Internal Rotation 4/5    Right Shoulder External Rotation 4/5    Left Shoulder Flexion 3+/5    Left Shoulder Extension 5/5    Left Shoulder ABduction 4/5    Left Shoulder Internal Rotation 4/5   pain during testing   Left Shoulder External Rotation 4-/5      Palpation   Palpation comment TTP along the L upper trap/ levator scapuale, and infraspinatus along      Special Tests    Special Tests Rotator Cuff Impingement                        Objective measurements completed on examination: See above findings.               PT Education - 04/25/21 1005     Education Details evaluation findings, POC, goals, HEP with proper form/ rationale. FOTO assessment.    Person(s) Educated Patient    Methods Explanation;Verbal cues;Handout    Comprehension Verbalized understanding;Verbal cues required              PT Short Term Goals - 04/25/21 1150       PT SHORT TERM GOAL #1   Title pt to be IND with initial HEP    Time 4    Period Weeks    Status New    Target Date 05/23/21               PT Long Term Goals - 04/25/21 1150       PT LONG TERM GOAL #1   Title increase L shoulder gross strength to >/= 4+/5 with </= 1/10 pain during testing    Time 8    Period Weeks    Status New    Target Date 06/20/21      PT LONG TERM GOAL #2   Title pt to be able to lift/ lower to and from and overhead shelf and from floor<>waist >/= 8 # with </= 1/10 pain for functional strength required for ADLs    Time 8    Period Weeks    Status New    Target Date 06/20/21      PT LONG TERM GOAL #3   Title increase FOTO score to >/= 65% to demo improvement in function    Time 8    Period Weeks    Status New    Target Date 06/20/21      PT LONG TERM GOAL #4   Title pt to be able to don/ doff clothing  and personal hygiene with no report of limtation per personal goal    Time 8    Period Weeks    Status New    Target Date 06/20/21      PT LONG TERM GOAL #5   Title pt to be IND with all HEP and is able to maintain and progress current LOF IND    Time 8    Period Weeks    Status New    Target Date 06/20/21                    Plan - 04/25/21 1124     Clinical Impression Statement Pt is a pleasant 80 y.o F presenting  to OPPT with CC of  L shoudler pain starting as a result of a fall that occured in 08/2019. She has functional shoulder ROM in all planes. MMT revealed gross weakness in the L shoulder compared bil. TTP along the upper trap/ levator scapulae, infraspinatus and bicep on the L with trigger points noted. she would benefit from physical therapy to increase gross shoulder strength, and reduc epain and maximize her function by addressing the deficits listed.    Personal Factors and Comorbidities Comorbidity 2    Comorbidities hx or pre-diabetes, Cx    Examination-Activity Limitations Lift    Rehab Potential Good    PT Frequency 1x / week    PT Duration 8 weeks    PT Treatment/Interventions ADLs/Self Care Home Management;Cryotherapy;Therapeutic activities;Therapeutic exercise;Balance training;Neuromuscular re-education;Manual techniques;Patient/family education;Passive range of motion;Dry needling;Taping    PT Next Visit Plan review/ updated HEP PRN- hx of Cx no heat or E-stim. gross shoulder strength, scapular stability, bicep strengthening.    PT Home Exercise Plan [facility] - upper trap stretch, wall push up with plus, scapular retraction, shoulder IR/ER    Consulted and Agree with Plan of Care Patient             Patient will benefit from skilled therapeutic intervention in order to improve the following deficits and impairments:  Improper body mechanics, Increased muscle spasms, Decreased strength, Postural dysfunction, Pain, Decreased activity tolerance  Visit  Diagnosis: Chronic left shoulder pain  Muscle weakness (generalized)     Problem List Patient Active Problem List   Diagnosis Date Noted   Pain, dental 02/10/2021   Tear of left biceps muscle 01/08/2021   Preoperative evaluation to rule out surgical contraindication 11/04/2020   Primary osteoarthritis of right hip 07/20/2020   Pre-ulcerative corn or callous 07/09/2020   Elevated liver enzymes 04/16/2020   Short-term memory loss 07/31/2019   Educated about COVID-19 virus infection 01/30/2019   Osteopenia determined by x-ray 03/02/2018   Myalgia due to statin 01/02/2018   Stage 2 carcinoma of breast, ER+, unspecified laterality ([place]) 04/29/2017   Drug-induced skin rash 04/24/2016   Cervical radiculopathy due to degenerative joint disease of spine 03/19/2015   Acromioclavicular joint arthritis 02/19/2015   Rotator cuff dysfunction 01/08/2015   Shoulder pain, bilateral 12/21/2014   Acquired calf asymmetry 12/21/2014   Sciatica of left side 10/17/2014   Encounter for Medicare annual wellness exam 05/22/2014   Basal cell carcinoma of leg 12/20/2013   Overweight (BMI 25.0-29.9) 08/08/2013   Acute non-recurrent pansinusitis 01/01/2013   Balance problem due to vestibular dysfunction 05/01/2012   Status post total knee replacement 05/01/2012   Breast cancer in situ 11/09/2011   Acquired hypothyroidism 07/26/2011   Hyperlipidemia    Diabetes mellitus type 2, diet-controlled ([place]) 07/23/2011   [place]arr [place] PT, DPT, LAT, ATC  04/25/21  11:55 AM      [employer] [facility] [address] [place], [place], [address] Phone: [phone]   Fax:  [phone]  Name: MAKINLEE DUNWORTH MRN: [id number] Date of Birth: 1940-12-25

## 2021-05-02 ENCOUNTER — Other Ambulatory Visit: Payer: Self-pay

## 2021-05-02 ENCOUNTER — Other Ambulatory Visit: Payer: PPO

## 2021-05-02 ENCOUNTER — Other Ambulatory Visit (INDEPENDENT_AMBULATORY_CARE_PROVIDER_SITE_OTHER): Payer: PPO

## 2021-05-02 DIAGNOSIS — E119 Type 2 diabetes mellitus without complications: Secondary | ICD-10-CM

## 2021-05-02 DIAGNOSIS — R413 Other amnesia: Secondary | ICD-10-CM | POA: Diagnosis not present

## 2021-05-02 LAB — TSH: TSH: 0.88 u[IU]/mL (ref 0.35–5.50)

## 2021-05-02 LAB — COMPREHENSIVE METABOLIC PANEL
ALT: 19 U/L (ref 0–35)
AST: 18 U/L (ref 0–37)
Albumin: 4.3 g/dL (ref 3.5–5.2)
Alkaline Phosphatase: 68 U/L (ref 39–117)
BUN: 16 mg/dL (ref 6–23)
CO2: 25 mEq/L (ref 19–32)
Calcium: 9.7 mg/dL (ref 8.4–10.5)
Chloride: 104 mEq/L (ref 96–112)
Creatinine, Ser: 0.92 mg/dL (ref 0.40–1.20)
GFR: 59.3 mL/min — ABNORMAL LOW (ref >60.00)
Glucose, Bld: 138 mg/dL — ABNORMAL HIGH (ref 70–99)
Potassium: 4.2 mEq/L (ref 3.5–5.1)
Sodium: 140 mEq/L (ref 135–145)
Total Bilirubin: 1 mg/dL (ref 0.2–1.2)
Total Protein: 6.8 g/dL (ref 6.0–8.3)

## 2021-05-02 LAB — LIPID PANEL
Cholesterol: 239 mg/dL — ABNORMAL HIGH (ref 0–200)
HDL: 77.6 mg/dL (ref >39.00)
LDL Cholesterol: 145 mg/dL — ABNORMAL HIGH (ref 0–99)
NonHDL: 161.63
Total CHOL/HDL Ratio: 3
Triglycerides: 84 mg/dL (ref 0.0–149.0)
VLDL: 16.8 mg/dL (ref 0.0–40.0)

## 2021-05-02 LAB — HEMOGLOBIN A1C: Hgb A1c MFr Bld: 6.8 % — ABNORMAL HIGH (ref 4.6–6.5)

## 2021-05-05 DIAGNOSIS — Z96641 Presence of right artificial hip joint: Secondary | ICD-10-CM | POA: Diagnosis not present

## 2021-05-05 DIAGNOSIS — M1612 Unilateral primary osteoarthritis, left hip: Secondary | ICD-10-CM | POA: Diagnosis not present

## 2021-05-06 ENCOUNTER — Other Ambulatory Visit: Payer: Self-pay

## 2021-05-06 ENCOUNTER — Ambulatory Visit (INDEPENDENT_AMBULATORY_CARE_PROVIDER_SITE_OTHER): Payer: PPO | Admitting: Internal Medicine

## 2021-05-06 ENCOUNTER — Encounter: Payer: Self-pay | Admitting: Internal Medicine

## 2021-05-06 ENCOUNTER — Telehealth: Payer: Self-pay | Admitting: Internal Medicine

## 2021-05-06 VITALS — BP 128/84 | HR 92 | Temp 96.6°F | Resp 16 | Ht 63.5 in | Wt 176.8 lb

## 2021-05-06 DIAGNOSIS — E663 Overweight: Secondary | ICD-10-CM | POA: Diagnosis not present

## 2021-05-06 DIAGNOSIS — N1831 Chronic kidney disease, stage 3a: Secondary | ICD-10-CM | POA: Insufficient documentation

## 2021-05-06 DIAGNOSIS — G8929 Other chronic pain: Secondary | ICD-10-CM | POA: Diagnosis not present

## 2021-05-06 DIAGNOSIS — E039 Hypothyroidism, unspecified: Secondary | ICD-10-CM

## 2021-05-06 DIAGNOSIS — N183 Chronic kidney disease, stage 3 unspecified: Secondary | ICD-10-CM | POA: Diagnosis not present

## 2021-05-06 DIAGNOSIS — N3946 Mixed incontinence: Secondary | ICD-10-CM | POA: Diagnosis not present

## 2021-05-06 DIAGNOSIS — M25512 Pain in left shoulder: Secondary | ICD-10-CM

## 2021-05-06 DIAGNOSIS — E1169 Type 2 diabetes mellitus with other specified complication: Secondary | ICD-10-CM

## 2021-05-06 DIAGNOSIS — E785 Hyperlipidemia, unspecified: Secondary | ICD-10-CM

## 2021-05-06 DIAGNOSIS — E1122 Type 2 diabetes mellitus with diabetic chronic kidney disease: Secondary | ICD-10-CM | POA: Diagnosis not present

## 2021-05-06 DIAGNOSIS — H832X3 Labyrinthine dysfunction, bilateral: Secondary | ICD-10-CM

## 2021-05-06 NOTE — Patient Instructions (Addendum)
Thank you for your concern and your prayers!  I appreciate you!   1) Consider the [employer] for lowering cholesterol.  It has a better effect on cholesterol and A1C's than the KETO diet bc the protein source for most of their products is soy  2) I am referring you to a kidney specialist for your slight decline in kidney function.  He will likely recommend that you stop the meloxicam,.    3) Your pneumonia vaccination status Is complete.   You do not the new pneumonia vaccine    4) You can take up to 2000 mg of acetominophen (tylenol) every day safely  In divided doses (500 mg every 6 hours  Or 1000 mg every 12 hours.)   5) If your urine has no sign of UTI,  I will suggest a trial of a medication for overactive bladder

## 2021-05-06 NOTE — Assessment & Plan Note (Addendum)
She continues to have excellent control with diet alone.   Patient is up-to-date on eye exams and foot exam is normal today.  Patient has no history of  Microalbuminuria but is due for repeat testing  . Patient is intolerant of statin therapy for CAD risk reduction due to myalgias. .  Lab Results  Component Value Date   HGBA1C 6.8 (H) 05/02/2021

## 2021-05-06 NOTE — Progress Notes (Signed)
Subjective:  Patient ID: Jacqueline Mata, female    DOB: 1941/02/09  Age: 80 y.o. MRN: [id number]  CC: The primary encounter diagnosis was Hyperlipidemia associated with type 2 diabetes mellitus ([place]). Diagnoses of Chronic pain in left shoulder, CKD stage 3 secondary to diabetes [facility]), Mixed stress and urge urinary incontinence, Acquired hypothyroidism, Balance problem due to vestibular dysfunction of both ears, and Overweight (BMI 25.0-29.9) were also pertinent to this visit.  HPI Jacqueline Mata presents for follow up on type 2 DM with hyperlipidemia  and hypertension. Multiple issues addressed today    This visit occurred during the SARS-CoV-2 public health emergency.  Safety protocols were in place, including screening questions prior to the visit, additional usage of staff PPE, and extensive cleaning of exam room while observing appropriate contact time as indicated for disinfecting solutions.   Hyperlipidemia managed with diet due to statin induced myalgias, multiple trials. Symptoms were severe  Slight decrease in GFR  noted for the last 6 months.  Has been using meloxicam not more than 2-3 times per week. For complicated orthopedic issues (rotator cuff syndrome).  She has concerns about recurrent use of antibiotics , and 13 rounds of amoxicillin over the course of one year for treatment of peridontal disease and procedures.   Bilateral shoulder injuries  with left shoulder re injury and multiple tendon tears.  Starting [facility] outpatient rehab  from Dr.  Tamera Punt who advocated for non surgical treatment   Type 2 DM:  Discussed dietary changes to lower a1c .  Has joined [employer] for cardio .  Hip pain ,  vertigo aggravated by ellipitical machine. Discussed alternatives   Urge incontinence, both urge and stress  (mentioned at the END of a 40 minute visit).  No prior evaluation     Outpatient Medications Prior to Visit  Medication Sig Dispense Refill   acetaminophen (TYLENOL)  500 MG tablet Take 1,000 mg by mouth every 6 (six) hours as needed for moderate pain.     amoxicillin (AMOXIL) 500 MG capsule Take 2,000 mg by mouth See admin instructions. Dental procedures. Takes 2071m one hour prior to dental procedures.     Blood Glucose Monitoring Suppl KIT by Does not apply route.     Cholecalciferol (VITAMIN D) 50 MCG (2000 UT) tablet Take 2,000 Units by mouth daily.     folic acid (FOLVITE) 8124MCG tablet Take 800 mcg by mouth daily.     glucose blood (ACCU-CHEK AVIVA PLUS) test strip Use to check blood sugar once daily. 100 each 5   Lancets (ACCU-CHEK MULTICLIX) lancets Use once daily to check blood sugars   Dx:E11.9 100 each 3   levothyroxine (SYNTHROID) 88 MCG tablet TAKE 1 TABLET BY MOUTH DAILY BEFORE BREAKFAST 90 tablet 0   loratadine (CLARITIN) 10 MG tablet Take 10 mg by mouth daily.     meloxicam (MOBIC) 15 MG tablet Take 15 mg by mouth daily.     Probiotic Product (PROBIOTIC DAILY PO) Take 1 capsule by mouth daily.     amoxicillin-clavulanate (AUGMENTIN) 875-125 MG tablet Take 1 tablet by mouth 2 (two) times daily. (Patient not taking: Reported on 04/25/2021) 20 tablet 0   erythromycin ophthalmic ointment Place 1 application into the right eye at bedtime. MUST BE PRESERVATIVE FREE .  DO NOT FILL OTHERWISE (Patient not taking: Reported on 04/25/2021) 3.5 g 0   metronidazole (NORITATE) 1 % cream Apply 1 application topically in the morning and at bedtime. (Patient not taking: Reported on 05/06/2021)  No facility-administered medications prior to visit.    Review of Systems;  Patient denies headache, fevers, malaise, unintentional weight loss, skin rash, eye pain, sinus congestion and sinus pain, sore throat, dysphagia,  hemoptysis , cough, dyspnea, wheezing, chest pain, palpitations, orthopnea, edema, abdominal pain, nausea, melena, diarrhea, constipation, flank pain, dysuria, hematuria, urinary  Frequency, nocturia, numbness, tingling, seizures,  Focal weakness,  Loss of consciousness,  Tremor, insomnia, depression, anxiety, and suicidal ideation.      Objective:  BP 128/84 (BP Location: Left Arm, Patient Position: Sitting, Cuff Size: Normal)   Pulse 92   Temp (!) 96.6 F (35.9 C) (Temporal)   Resp 16   Ht 5' 3.5" (1.613 m)   Wt 176 lb 12.8 oz (80.2 kg)   SpO2 98%   BMI 30.83 kg/m   BP Readings from Last 3 Encounters:  05/06/21 128/84  02/20/21 120/86  01/08/21 130/90    Wt Readings from Last 3 Encounters:  05/06/21 176 lb 12.8 oz (80.2 kg)  02/20/21 175 lb (79.4 kg)  02/10/21 170 lb (77.1 kg)    General appearance: alert, cooperative and appears stated age Ears: normal TM's and external ear canals both ears Throat: lips, mucosa, and tongue normal; teeth and gums normal Neck: no adenopathy, no carotid bruit, supple, symmetrical, trachea midline and thyroid not enlarged, symmetric, no tenderness/mass/nodules Back: symmetric, no curvature. ROM normal. No CVA tenderness. Lungs: clear to auscultation bilaterally Heart: regular rate and rhythm, S1, S2 normal, no murmur, click, rub or gallop Abdomen: soft, non-tender; bowel sounds normal; no masses,  no organomegaly Pulses: 2+ and symmetric Skin: Skin color, texture, turgor normal. No rashes or lesions Lymph nodes: Cervical, supraclavicular, and axillary nodes normal.  Lab Results  Component Value Date   HGBA1C 6.8 (H) 05/02/2021   HGBA1C 6.5 10/31/2020   HGBA1C 7.5 (H) 07/19/2020    Lab Results  Component Value Date   CREATININE 0.92 05/02/2021   CREATININE 0.91 12/31/2020   CREATININE 0.97 11/05/2020    Lab Results  Component Value Date   WBC 8.4 12/31/2020   HGB 13.3 12/31/2020   HCT 39.2 12/31/2020   PLT 194 12/31/2020   GLUCOSE 138 (H) 05/02/2021   CHOL 239 (H) 05/02/2021   TRIG 84.0 05/02/2021   HDL 77.60 05/02/2021   LDLDIRECT 122.0 07/01/2017   LDLCALC 145 (H) 05/02/2021   ALT 19 05/02/2021   AST 18 05/02/2021   NA 140 05/02/2021   K 4.2 05/02/2021   CL  104 05/02/2021   CREATININE 0.92 05/02/2021   BUN 16 05/02/2021   CO2 25 05/02/2021   TSH 0.88 05/02/2021   INR 1.1 11/05/2020   HGBA1C 6.8 (H) 05/02/2021   MICROALBUR <0.7 04/15/2020    DG Arthro Shoulder Left  Result Date: 03/26/2021 CLINICAL DATA:  Left shoulder pain. EXAM: LEFT SHOULDER INJECTION UNDER FLUOROSCOPY TECHNIQUE: An appropriate skin entrance site was determined. The site was marked, prepped with Betadine, draped in the usual sterile fashion, and infiltrated locally with 1% lidocaine. 22 gauge spinal needle was advanced to the superomedial margin of the humeral head under intermittent fluoroscopy. A standardized mixture of sterile saline, Omnipaque 180, and gadolinium administered. FLUOROSCOPY TIME:  Fluoroscopy Time:  2 minutes 18 seconds Radiation Exposure Index (if provided by the fluoroscopic device): 15.9 mGy FINDINGS: Successful left shoulder injection for MRI arthrogram. No complications. Patient sent to MRI in stable condition. IMPRESSION: Technically successful left shoulder injection for MRI. Electronically Signed   By: Marcello Moores  Register   On: 03/26/2021 11:00  MR Shoulder Left W Contrast  Result Date: 03/27/2021 CLINICAL DATA:  Severe left shoulder pain status post twisting the arm 6 months ago. EXAM: MR ARTHROGRAM OF THE LEFT SHOULDER TECHNIQUE: Multiplanar, multisequence MR imaging of the left shoulder was performed following the administration of intra-articular contrast. CONTRAST:  See Injection Documentation. COMPARISON:  None. FINDINGS: Rotator cuff: Large full-thickness tear of the anterior half of the supraspinatus tendon with 8 mm of retraction as well as a high-grade partial-thickness articular surface tear of the posterior half of the supraspinatus tendon. Severe tendinosis of the infraspinatus tendon. Teres minor tendon is intact. Mild tendinosis of the subscapularis tendon with a high-grade partial-thickness tear. Muscles: No muscle atrophy or edema. No  intramuscular fluid collection or hematoma. Biceps Long Head: Complete tear of the intra-articular portion of the long head of the biceps tendon. Acromioclavicular Joint: Mild arthropathy of the acromioclavicular joint. Type II acromion. Large amount of contrast in the subacromial/subdeltoid bursa. Glenohumeral Joint: Intraarticular contrast distending the joint capsule. Normal glenohumeral ligaments. No chondral defect. Labrum: Blunting of the superior labrum likely reflecting a tear. Bones: No fracture or dislocation. No aggressive osseous lesion. Other: No fluid collection or hematoma. IMPRESSION: 1. Large full-thickness tear of the anterior half of the supraspinatus tendon with 8 mm of retraction as well as a high-grade partial-thickness articular surface tear of the posterior half of the supraspinatus tendon. 2. Severe tendinosis of the infraspinatus tendon. 3. Mild tendinosis of the subscapularis tendon with a high-grade partial-thickness tear. 4. Complete tear of the intra-articular portion of the long head of the biceps tendon. Electronically Signed   By: Kathreen Devoid   On: 03/27/2021 06:57    Assessment & Plan:   Problem List Items Addressed This Visit       Unprioritized   Hyperlipidemia associated with type 2 diabetes mellitus ([place]) - Primary     She continues to have excellent control with diet alone.   Patient is up-to-date on eye exams and foot exam is normal today.  Patient has no history of  Microalbuminuria but is due for repeat testing  . Patient is intolerant of statin therapy for CAD risk reduction due to myalgias. .  Lab Results  Component Value Date   HGBA1C 6.8 (H) 05/02/2021       Relevant Orders   Microalbumin / creatinine urine ratio   Acquired hypothyroidism    Thyroid function is normal and she has no symptoms of overactive thyroid   Lab Results  Component Value Date   TSH 0.88 05/02/2021         Balance problem due to vestibular dysfunction    Aggravated by  use of Ellipitcal,  Recommend use of Nustep or recumbent bike       Overweight (BMI 25.0-29.9)   Chronic pain in left shoulder    Secondary to rotator cuff pathology with June 2022 MRI noting supraspinatus and infraspinatus tears as wll as long head of biceps tear        CKD stage 3 secondary to diabetes ([place])    Likely due to DM.  Uses NSAIDS less than daily .  Referral to nephrology       Relevant Orders   Ambulatory referral to Nephrology   Mixed stress and urge urinary incontinence    Will rule out UTI ,  Followed by empiric trial of myrbetriq       Relevant Orders   Urinalysis, Routine w reflex microscopic   Urine Culture   A total of 40  minutes of face to face time was spent with patient  reviewed recent labs,  imagins studies and treatment plans, as well as  counselling on dietary management  of type 2 diabetes  and coordination of care     Medications Discontinued During This Encounter  Medication Reason   amoxicillin-clavulanate (AUGMENTIN) 875-125 MG tablet    erythromycin ophthalmic ointment    metronidazole (NORITATE) 1 % cream     Follow-up: Return in about 4 weeks (around 06/03/2021).   [name], MD

## 2021-05-06 NOTE — Telephone Encounter (Signed)
Lft pt vm to call ofc regarding referral to nephrology. thanks

## 2021-05-06 NOTE — Assessment & Plan Note (Signed)
Will rule out UTI ,  Followed by empiric trial of myrbetriq

## 2021-05-06 NOTE — Assessment & Plan Note (Signed)
Aggravated by use of Ellipitcal,  Recommend use of Nustep or recumbent bike

## 2021-05-06 NOTE — Assessment & Plan Note (Signed)
Likely due to DM.  Uses NSAIDS less than daily .  Referral to nephrology

## 2021-05-06 NOTE — Assessment & Plan Note (Signed)
Secondary to rotator cuff pathology with June 2022 MRI noting supraspinatus and infraspinatus tears as wll as long head of biceps tear

## 2021-05-06 NOTE — Assessment & Plan Note (Signed)
Thyroid function is normal and she has no symptoms of overactive thyroid   Lab Results  Component Value Date   TSH 0.88 05/02/2021

## 2021-05-07 ENCOUNTER — Ambulatory Visit: Payer: PPO | Attending: Surgical | Admitting: Physical Therapy

## 2021-05-07 ENCOUNTER — Encounter: Payer: Self-pay | Admitting: Physical Therapy

## 2021-05-07 DIAGNOSIS — M5416 Radiculopathy, lumbar region: Secondary | ICD-10-CM | POA: Diagnosis not present

## 2021-05-07 DIAGNOSIS — G8929 Other chronic pain: Secondary | ICD-10-CM | POA: Diagnosis not present

## 2021-05-07 DIAGNOSIS — M9901 Segmental and somatic dysfunction of cervical region: Secondary | ICD-10-CM | POA: Diagnosis not present

## 2021-05-07 DIAGNOSIS — M6281 Muscle weakness (generalized): Secondary | ICD-10-CM | POA: Insufficient documentation

## 2021-05-07 DIAGNOSIS — M9903 Segmental and somatic dysfunction of lumbar region: Secondary | ICD-10-CM | POA: Diagnosis not present

## 2021-05-07 DIAGNOSIS — M25512 Pain in left shoulder: Secondary | ICD-10-CM | POA: Insufficient documentation

## 2021-05-07 DIAGNOSIS — M62830 Muscle spasm of back: Secondary | ICD-10-CM | POA: Diagnosis not present

## 2021-05-07 LAB — URINALYSIS, ROUTINE W REFLEX MICROSCOPIC
Bilirubin Urine: NEGATIVE
Ketones, ur: NEGATIVE
Leukocytes,Ua: NEGATIVE
Nitrite: NEGATIVE
RBC / HPF: NONE SEEN (ref 0–?)
Specific Gravity, Urine: <=1.005 — AB (ref 1.000–1.030)
Total Protein, Urine: NEGATIVE
Urine Glucose: NEGATIVE
Urobilinogen, UA: 0.2 (ref 0.0–1.0)
pH: 6 (ref 5.0–8.0)

## 2021-05-07 LAB — URINE CULTURE
MICRO NUMBER:: 12219564
SPECIMEN QUALITY:: ADEQUATE

## 2021-05-07 LAB — MICROALBUMIN / CREATININE URINE RATIO
Creatinine,U: 53.4 mg/dL
Microalb Creat Ratio: 1.3 mg/g (ref 0.0–30.0)
Microalb, Ur: <0.7 mg/dL (ref 0.0–1.9)

## 2021-05-07 NOTE — Therapy (Signed)
[place], [place], [address] Phone: [phone]   Fax:  [phone]  Physical Therapy Treatment  Patient Details  Name: Jacqueline Mata MRN: [id number] Date of Birth: 03/07/1941 Referring Provider (PT): Sheryle Hail, [place]   Encounter Date: 05/07/2021   PT End of Session - 05/07/21 0934     Visit Number 2    Number of Visits 9    Date for PT Re-Evaluation 06/20/21    Authorization Type [employer]: Kx mod at 15th visit, FOTO 6th and 10th visit.    Progress Note Due on Visit 10    PT Start Time 0933    PT Stop Time 1015    PT Time Calculation (min) 42 min    Activity Tolerance Patient tolerated treatment well    Behavior During Therapy [facility] for tasks assessed/performed             Past Medical History:  Diagnosis Date   Anxiety    Arthritis    breast cancer 2005   bilateral   Cataract 01/04/13 and [id number]   Complication of anesthesia    dizziness    Diabetes mellitus    pt denies at preop of 11/05/2020    Hyperlipidemia    hypothyroidism    Hypothyroidism    Personal history of chemotherapy    Personal history of radiation therapy    PONV (postoperative nausea and vomiting)    Pre-diabetes     Past Surgical History:  Procedure Laterality Date   bilateral knee replacement s     BREAST BIOPSY Left 2005   positive   BREAST BIOPSY Right 2005   positive   BREAST LUMPECTOMY Left 2005   BREAST LUMPECTOMY Right 2005   gumm surgery      JOINT REPLACEMENT  2013   by Dr. Marry Guan   righ tknee arthroscopy      TONSILLECTOMY     TOTAL HIP ARTHROPLASTY Right 11/12/2020   Procedure: RIGHT TOTAL HIP ARTHROPLASTY ANTERIOR APPROACH;  Surgeon: Melrose Nakayama, MD;  Location: [facility];  Service: Orthopedics;  Laterality: Right;    There were no vitals filed for this visit.   Subjective Assessment - 05/07/21 0936     Subjective "I've been pretty good, I did get a S hook to work the  shoulder. The shoulders are going good, and I did them every day."    Diagnostic tests 03/26/2021 - IMPRESSION:  1. Large full-thickness tear of the anterior half of the  supraspinatus tendon with 8 mm of retraction as well as a high-grade  partial-thickness articular surface tear of the posterior half of  the supraspinatus tendon.  2. Severe tendinosis of the infraspinatus tendon.  3. Mild tendinosis of the subscapularis tendon with a high-grade  partial-thickness tear.  4. Complete tear of the intra-articular portion of the long head of  the biceps tendon.    Currently in Pain? Yes    Aggravating Factors  stays within the range that is comfortable    Pain Relieving Factors getting out of the position.                               [place] Adult PT Treatment/Exercise - 05/07/21 0001       Self-Care   Self-Care Other Self-Care Comments    Other Self-Care Comments  how to perform self trigger point release      Exercises   Exercises Shoulder  Elbow Exercises   Other elbow exercises elbow flexion 2 x 10 with focuse on eccentric loading      Shoulder Exercises: Standing   Protraction Strengthening;Both   wall push 1 x 10   External Rotation Strengthening;10 reps   x 2 sets   Internal Rotation Strengthening;10 reps;Theraband    Theraband Level (Shoulder Internal Rotation) Level 2 (Red)    Row Strengthening;Both;10 reps;Theraband    Theraband Level (Shoulder Row) Level 3 (Green)    Other Standing Exercises money 1 x 10 with RTB                    PT Education - 05/07/21 1005     Education Details Reviewed HEP and updated today.    Person(s) Educated Patient    Methods Explanation;Verbal cues;Handout    Comprehension Verbal cues required              PT Short Term Goals - 04/25/21 1150       PT SHORT TERM GOAL #1   Title pt to be IND with initial HEP    Time 4    Period Weeks    Status New    Target Date 05/23/21               PT  Long Term Goals - 04/25/21 1150       PT LONG TERM GOAL #1   Title increase L shoulder gross strength to >/= 4+/5 with </= 1/10 pain during testing    Time 8    Period Weeks    Status New    Target Date 06/20/21      PT LONG TERM GOAL #2   Title pt to be able to lift/ lower to and from and overhead shelf and from floor<>waist >/= 8 # with </= 1/10 pain for functional strength required for ADLs    Time 8    Period Weeks    Status New    Target Date 06/20/21      PT LONG TERM GOAL #3   Title increase FOTO score to >/= 65% to demo improvement in function    Time 8    Period Weeks    Status New    Target Date 06/20/21      PT LONG TERM GOAL #4   Title pt to be able to don/ doff clothing and personal hygiene with no report of limtation per personal goal    Time 8    Period Weeks    Status New    Target Date 06/20/21      PT LONG TERM GOAL #5   Title pt to be IND with all HEP and is able to maintain and progress current LOF IND    Time 8    Period Weeks    Status New    Target Date 06/20/21                   Plan - 05/07/21 0953     Clinical Impression Statement pt arrives to session reporting consistnecy with her HEP and reports decreased pain in the shoulder. time was taken to review HEP and minimal cues given for proper form. continued working on scapular stabilty and rotator cuff strengthening. added bicep stretching with emphasis on eccentrics.    PT Treatment/Interventions ADLs/Self Care Home Management;Cryotherapy;Therapeutic activities;Therapeutic exercise;Balance training;Neuromuscular re-education;Manual techniques;Patient/family education;Passive range of motion;Dry needling;Taping    PT Next Visit Plan review/ updated HEP PRN- hx of Cx no heat  or E-stim. gross shoulder strength, scapular stability, bicep strengthening.    PT Home Exercise Plan [id number] - upper trap stretch, wall push up with plus, scapular retraction, shoulder IR/ER, money, rows              Patient will benefit from skilled therapeutic intervention in order to improve the following deficits and impairments:  Improper body mechanics, Increased muscle spasms, Decreased strength, Postural dysfunction, Pain, Decreased activity tolerance  Visit Diagnosis: Chronic left shoulder pain  Muscle weakness (generalized)     Problem List Patient Active Problem List   Diagnosis Date Noted   Chronic pain in left shoulder 05/06/2021   CKD stage 3 secondary to diabetes ([place]) 05/06/2021   Mixed stress and urge urinary incontinence 05/06/2021   Pain, dental 02/10/2021   Tear of left biceps muscle 01/08/2021   Preoperative evaluation to rule out surgical contraindication 11/04/2020   Primary osteoarthritis of right hip 07/20/2020   Pre-ulcerative corn or callous 07/09/2020   Elevated liver enzymes 04/16/2020   Short-term memory loss 07/31/2019   Educated about COVID-19 virus infection 01/30/2019   Osteopenia determined by x-ray 03/02/2018   Myalgia due to statin 01/02/2018   Stage 2 carcinoma of breast, ER+, unspecified laterality ([place]) 04/29/2017   Cervical radiculopathy due to degenerative joint disease of spine 03/19/2015   Acromioclavicular joint arthritis 02/19/2015   Rotator cuff dysfunction 01/08/2015   Shoulder pain, bilateral 12/21/2014   Sciatica of left side 10/17/2014   Encounter for Medicare annual wellness exam 05/22/2014   Basal cell carcinoma of leg 12/20/2013   Overweight (BMI 25.0-29.9) 08/08/2013   Balance problem due to vestibular dysfunction 05/01/2012   Status post total knee replacement 05/01/2012   Breast cancer in situ 11/09/2011   Acquired hypothyroidism 07/26/2011   Hyperlipidemia associated with type 2 diabetes mellitus ([place]) 07/23/2011   [place] PT, DPT, LAT, ATC  05/07/21  10:42 AM      [place] [facility] [address] [place], [place], [address] Phone: [phone]   Fax:   [phone]  Name: Jacqueline Mata MRN: [id number] Date of Birth: Jun 22, 1941

## 2021-05-15 ENCOUNTER — Ambulatory Visit: Payer: PPO | Admitting: Physical Therapy

## 2021-05-15 ENCOUNTER — Encounter: Payer: Self-pay | Admitting: Physical Therapy

## 2021-05-15 ENCOUNTER — Other Ambulatory Visit: Payer: Self-pay

## 2021-05-15 DIAGNOSIS — M25512 Pain in left shoulder: Secondary | ICD-10-CM | POA: Diagnosis not present

## 2021-05-15 DIAGNOSIS — G8929 Other chronic pain: Secondary | ICD-10-CM

## 2021-05-15 DIAGNOSIS — M6281 Muscle weakness (generalized): Secondary | ICD-10-CM

## 2021-05-15 NOTE — Telephone Encounter (Signed)
PT called to advise to advise that she would like to speak to the referral coordinator in regards to this.

## 2021-05-15 NOTE — Therapy (Signed)
[place], [place], [address] Phone: [phone]   Fax:  [phone]  Physical Therapy Treatment  Patient Details  Name: Jacqueline Mata MRN: [id number] Date of Birth: Feb 04, 1941 Referring Provider (PT): Sheryle Hail, [place]   Encounter Date: 05/15/2021   PT End of Session - 05/15/21 1549     Visit Number 3    Number of Visits 9    Date for PT Re-Evaluation 06/20/21    Authorization Type [employer]: Kx mod at 15th visit, FOTO 6th and 10th visit.    PT Start Time [id number]    PT Stop Time 1628    PT Time Calculation (min) 41 min    Activity Tolerance Patient tolerated treatment well    Behavior During Therapy [facility] for tasks assessed/performed             Past Medical History:  Diagnosis Date   Anxiety    Arthritis    breast cancer 2005   bilateral   Cataract 01/04/13 and [id number]   Complication of anesthesia    dizziness    Diabetes mellitus    pt denies at preop of 11/05/2020    Hyperlipidemia    hypothyroidism    Hypothyroidism    Personal history of chemotherapy    Personal history of radiation therapy    PONV (postoperative nausea and vomiting)    Pre-diabetes     Past Surgical History:  Procedure Laterality Date   bilateral knee replacement s     BREAST BIOPSY Left 2005   positive   BREAST BIOPSY Right 2005   positive   BREAST LUMPECTOMY Left 2005   BREAST LUMPECTOMY Right 2005   gumm surgery      JOINT REPLACEMENT  2013   by Dr. Marry Guan   righ tknee arthroscopy      TONSILLECTOMY     TOTAL HIP ARTHROPLASTY Right 11/12/2020   Procedure: RIGHT TOTAL HIP ARTHROPLASTY ANTERIOR APPROACH;  Surgeon: Melrose Nakayama, MD;  Location: [facility];  Service: Orthopedics;  Laterality: Right;    There were no vitals filed for this visit.   Subjective Assessment - 05/15/21 1550     Subjective " I am doing all my exercises, and I feel like I am getting better."    Diagnostic tests  03/26/2021 - IMPRESSION:  1. Large full-thickness tear of the anterior half of the  supraspinatus tendon with 8 mm of retraction as well as a high-grade  partial-thickness articular surface tear of the posterior half of  the supraspinatus tendon.  2. Severe tendinosis of the infraspinatus tendon.  3. Mild tendinosis of the subscapularis tendon with a high-grade  partial-thickness tear.  4. Complete tear of the intra-articular portion of the long head of  the biceps tendon.    Currently in Pain? No/denies    Aggravating Factors  moving it the wrong way.    Pain Relieving Factors getting out of position.                              [place] Adult PT Treatment/Exercise:  Therapeutic Exercise: Upper trap stretch 2 x 30 sec bil  Levator scapulae 2 x 20 sec Shoulder ER/IR 2 x 10 with RTB Rows 2 x 12 with GTB Scapution angle 2 x 10 with 1# Reaching into cabinet with LUE 1 x 10 lower shelf, 1 x 10 overhead shelf with 1#  Manual Therapy: MTPR along the upper trap/  levator scapulae Tack and stretch for the levator scapule and lower cervical paraspinals            PT Education - 05/15/21 1555     Education Details reviewed MTRP along              PT Short Term Goals - 04/25/21 1150       PT SHORT TERM GOAL #1   Title pt to be IND with initial HEP    Time 4    Period Weeks    Status New    Target Date 05/23/21               PT Long Term Goals - 04/25/21 1150       PT LONG TERM GOAL #1   Title increase L shoulder gross strength to >/= 4+/5 with </= 1/10 pain during testing    Time 8    Period Weeks    Status New    Target Date 06/20/21      PT LONG TERM GOAL #2   Title pt to be able to lift/ lower to and from and overhead shelf and from floor<>waist >/= 8 # with </= 1/10 pain for functional strength required for ADLs    Time 8    Period Weeks    Status New    Target Date 06/20/21      PT LONG TERM GOAL #3   Title increase FOTO score to  >/= 65% to demo improvement in function    Time 8    Period Weeks    Status New    Target Date 06/20/21      PT LONG TERM GOAL #4   Title pt to be able to don/ doff clothing and personal hygiene with no report of limtation per personal goal    Time 8    Period Weeks    Status New    Target Date 06/20/21      PT LONG TERM GOAL #5   Title pt to be IND with all HEP and is able to maintain and progress current LOF IND    Time 8    Period Weeks    Status New    Target Date 06/20/21                   Plan - 05/15/21 1626     Clinical Impression Statement Mrs Connick contiues to report improvement and consistency with her HEP. she brought her home theracane for review on how to perform self trigger point release. continued working shoulder strengthening increasing reps and band resistance for rows. She did well with all exercises noting no increase in pain or issues following session.    PT Treatment/Interventions ADLs/Self Care Home Management;Cryotherapy;Therapeutic activities;Therapeutic exercise;Balance training;Neuromuscular re-education;Manual techniques;Patient/family education;Passive range of motion;Dry needling;Taping    PT Next Visit Plan review/ updated HEP PRN- hx of Cx no heat or E-stim. gross shoulder strength, scapular stability, bicep strengthening.update HEP next session.    PT Home Exercise Plan [id number] - upper trap stretch, wall push up with plus, scapular retraction, shoulder IR/ER, money, rows             Patient will benefit from skilled therapeutic intervention in order to improve the following deficits and impairments:  Improper body mechanics, Increased muscle spasms, Decreased strength, Postural dysfunction, Pain, Decreased activity tolerance  Visit Diagnosis: Chronic left shoulder pain  Muscle weakness (generalized)     Problem List Patient Active Problem List  Diagnosis Date Noted   Chronic pain in left shoulder 05/06/2021   CKD stage  3 secondary to diabetes ([place]) 05/06/2021   Mixed stress and urge urinary incontinence 05/06/2021   Pain, dental 02/10/2021   Tear of left biceps muscle 01/08/2021   Preoperative evaluation to rule out surgical contraindication 11/04/2020   Primary osteoarthritis of right hip 07/20/2020   Pre-ulcerative corn or callous 07/09/2020   Elevated liver enzymes 04/16/2020   Short-term memory loss 07/31/2019   Educated about COVID-19 virus infection 01/30/2019   Osteopenia determined by x-ray 03/02/2018   Myalgia due to statin 01/02/2018   Stage 2 carcinoma of breast, ER+, unspecified laterality ([place]) 04/29/2017   Cervical radiculopathy due to degenerative joint disease of spine 03/19/2015   Acromioclavicular joint arthritis 02/19/2015   Rotator cuff dysfunction 01/08/2015   Shoulder pain, bilateral 12/21/2014   Sciatica of left side 10/17/2014   Encounter for Medicare annual wellness exam 05/22/2014   Basal cell carcinoma of leg 12/20/2013   Overweight (BMI 25.0-29.9) 08/08/2013   Balance problem due to vestibular dysfunction 05/01/2012   Status post total knee replacement 05/01/2012   Breast cancer in situ 11/09/2011   Acquired hypothyroidism 07/26/2011   Hyperlipidemia associated with type 2 diabetes mellitus ([place]) 07/23/2011   Starr Lake PT, DPT, LAT, ATC  05/15/21  5:22 PM      [place] [address] [place], [place], [address] Phone: [phone]   Fax:  [phone]  Name: EWA KOT MRN: [id number] Date of Birth: [birth year]

## 2021-05-19 ENCOUNTER — Ambulatory Visit: Payer: PPO | Admitting: Physical Therapy

## 2021-05-19 ENCOUNTER — Other Ambulatory Visit: Payer: Self-pay

## 2021-05-19 ENCOUNTER — Encounter: Payer: Self-pay | Admitting: Physical Therapy

## 2021-05-19 DIAGNOSIS — G8929 Other chronic pain: Secondary | ICD-10-CM

## 2021-05-19 DIAGNOSIS — M25512 Pain in left shoulder: Secondary | ICD-10-CM | POA: Diagnosis not present

## 2021-05-19 DIAGNOSIS — C50912 Malignant neoplasm of unspecified site of left female breast: Secondary | ICD-10-CM | POA: Diagnosis not present

## 2021-05-19 DIAGNOSIS — M6281 Muscle weakness (generalized): Secondary | ICD-10-CM

## 2021-05-19 DIAGNOSIS — C50911 Malignant neoplasm of unspecified site of right female breast: Secondary | ICD-10-CM | POA: Diagnosis not present

## 2021-05-19 NOTE — Therapy (Signed)
[place], [place], [address] Phone: [phone]   Fax:  [phone]  Physical Therapy Treatment  Patient Details  Name: Jacqueline Mata MRN: [id number] Date of Birth: 01-20-1941 Referring Provider (PT): Sheryle Hail, [place]   Encounter Date: 05/19/2021   PT End of Session - 05/19/21 1102     Visit Number 4    Number of Visits 9    Date for PT Re-Evaluation 06/20/21    Authorization Type [employer]: Kx mod at 15th visit, FOTO 6th and 10th visit.    PT Start Time 1017    PT Stop Time 1058    PT Time Calculation (min) 41 min    Activity Tolerance Patient tolerated treatment well    Behavior During Therapy [facility] for tasks assessed/performed             Past Medical History:  Diagnosis Date   Anxiety    Arthritis    breast cancer 2005   bilateral   Cataract 01/04/13 and [id number]   Complication of anesthesia    dizziness    Diabetes mellitus    pt denies at preop of 11/05/2020    Hyperlipidemia    hypothyroidism    Hypothyroidism    Personal history of chemotherapy    Personal history of radiation therapy    PONV (postoperative nausea and vomiting)    Pre-diabetes     Past Surgical History:  Procedure Laterality Date   bilateral knee replacement s     BREAST BIOPSY Left 2005   positive   BREAST BIOPSY Right 2005   positive   BREAST LUMPECTOMY Left 2005   BREAST LUMPECTOMY Right 2005   gumm surgery      JOINT REPLACEMENT  2013   by Dr. Marry Guan   righ tknee arthroscopy      TONSILLECTOMY     TOTAL HIP ARTHROPLASTY Right 11/12/2020   Procedure: RIGHT TOTAL HIP ARTHROPLASTY ANTERIOR APPROACH;  Surgeon: Melrose Nakayama, MD;  Location: [facility];  Service: Orthopedics;  Laterality: Right;    There were no vitals filed for this visit.   Subjective Assessment - 05/19/21 1021     Subjective " I do get a sharp pain in the top of the shoulder on both sides, but is fleeting."     Diagnostic tests 03/26/2021 - IMPRESSION:  1. Large full-thickness tear of the anterior half of the  supraspinatus tendon with 8 mm of retraction as well as a high-grade  partial-thickness articular surface tear of the posterior half of  the supraspinatus tendon.  2. Severe tendinosis of the infraspinatus tendon.  3. Mild tendinosis of the subscapularis tendon with a high-grade  partial-thickness tear.  4. Complete tear of the intra-articular portion of the long head of  the biceps tendon.    Currently in Pain? Yes    Pain Score 2     Pain Orientation Left    Pain Descriptors / Indicators Aching    Pain Type Chronic pain    Pain Onset More than a month ago                [facility] PT Assessment - 05/19/21 0001       Assessment   Medical Diagnosis rotator cuff tear    Referring Provider (PT) [place], Amber, PA-C                           [facility] Adult PT Treatment/Exercise -  05/19/21 0001       Shoulder Exercises: Supine   Horizontal ABduction Strengthening;Both;10 reps    Theraband Level (Shoulder Horizontal ABduction) Level 2 (Red)    Other Supine Exercises PNF 2 x 10 with yellow band    Other Supine Exercises money double ER with scapular retraction 2 x 10 with RTB      Shoulder Exercises: ROM/Strengthening   UBE (Upper Arm Bike) L3 x 5 min   fwd/bwd x 2:30 ea.     Shoulder Exercises: Stretch   Other Shoulder Stretches upper trap stretch 1x 30 bil w      Manual Therapy   Manual therapy comments bil dilstal clavicle mobs grade III                    PT Education - 05/19/21 1101     Education Details Reviewed and updated HEP today    Person(s) Educated Patient    Methods Explanation;Verbal cues;Handout    Comprehension Verbalized understanding;Verbal cues required              PT Short Term Goals - 04/25/21 1150       PT SHORT TERM GOAL #1   Title pt to be IND with initial HEP    Time 4    Period Weeks    Status New    Target  Date 05/23/21               PT Long Term Goals - 04/25/21 1150       PT LONG TERM GOAL #1   Title increase L shoulder gross strength to >/= 4+/5 with </= 1/10 pain during testing    Time 8    Period Weeks    Status New    Target Date 06/20/21      PT LONG TERM GOAL #2   Title pt to be able to lift/ lower to and from and overhead shelf and from floor<>waist >/= 8 # with </= 1/10 pain for functional strength required for ADLs    Time 8    Period Weeks    Status New    Target Date 06/20/21      PT LONG TERM GOAL #3   Title increase FOTO score to >/= 65% to demo improvement in function    Time 8    Period Weeks    Status New    Target Date 06/20/21      PT LONG TERM GOAL #4   Title pt to be able to don/ doff clothing and personal hygiene with no report of limtation per personal goal    Time 8    Period Weeks    Status New    Target Date 06/20/21      PT LONG TERM GOAL #5   Title pt to be IND with all HEP and is able to maintain and progress current LOF IND    Time 8    Period Weeks    Status New    Target Date 06/20/21                   Plan - 05/19/21 1059     Clinical Impression Statement pt continues to be compliant with her HEP. she reports some soreness bil located at the [facility] joint which, worked on distal clavicle mobs and focused remained of session on scapular stability in supine which she did well with.    PT Treatment/Interventions ADLs/Self Care Home Management;Cryotherapy;Therapeutic activities;Therapeutic exercise;Balance training;Neuromuscular re-education;Manual  techniques;Patient/family education;Passive range of motion;Dry needling;Taping    PT Next Visit Plan review/ updated HEP PRN- hx of Cx no heat or E-stim. gross shoulder strength, scapular stability, bicep strengthening.update HEP next session.    PT Home Exercise Plan [id number] - upper trap stretch, wall push up with plus, scapular retraction, shoulder IR/ER, money, rows, scapular  protraction in supine, supine D2    Consulted and Agree with Plan of Care Patient             Patient will benefit from skilled therapeutic intervention in order to improve the following deficits and impairments:  Improper body mechanics, Increased muscle spasms, Decreased strength, Postural dysfunction, Pain, Decreased activity tolerance  Visit Diagnosis: Chronic left shoulder pain  Muscle weakness (generalized)     Problem List Patient Active Problem List   Diagnosis Date Noted   Chronic pain in left shoulder 05/06/2021   CKD stage 3 secondary to diabetes ([place]) 05/06/2021   Mixed stress and urge urinary incontinence 05/06/2021   Pain, dental 02/10/2021   Tear of left biceps muscle 01/08/2021   Preoperative evaluation to rule out surgical contraindication 11/04/2020   Primary osteoarthritis of right hip 07/20/2020   Pre-ulcerative corn or callous 07/09/2020   Elevated liver enzymes 04/16/2020   Short-term memory loss 07/31/2019   Educated about COVID-19 virus infection 01/30/2019   Osteopenia determined by x-ray 03/02/2018   Myalgia due to statin 01/02/2018   Stage 2 carcinoma of breast, ER+, unspecified laterality ([place]) 04/29/2017   Cervical radiculopathy due to degenerative joint disease of spine 03/19/2015   Acromioclavicular joint arthritis 02/19/2015   Rotator cuff dysfunction 01/08/2015   Shoulder pain, bilateral 12/21/2014   Sciatica of left side 10/17/2014   Encounter for Medicare annual wellness exam 05/22/2014   Basal cell carcinoma of leg 12/20/2013   Overweight (BMI 25.0-29.9) 08/08/2013   Balance problem due to vestibular dysfunction 05/01/2012   Status post total knee replacement 05/01/2012   Breast cancer in situ 11/09/2011   Acquired hypothyroidism 07/26/2011   Hyperlipidemia associated with type 2 diabetes mellitus ([place]) 07/23/2011   Starr Lake PT, DPT, LAT, ATC  05/19/21  11:03 AM      [place]  [facility] [address] [place], [place], [address] Phone: [phone]   Fax:  [phone]  Name: Jacqueline Mata MRN: [id number] Date of Birth: 1941-06-16

## 2021-05-26 ENCOUNTER — Ambulatory Visit: Payer: PPO | Admitting: Physical Therapy

## 2021-05-26 ENCOUNTER — Encounter: Payer: Self-pay | Admitting: Physical Therapy

## 2021-05-26 ENCOUNTER — Other Ambulatory Visit: Payer: Self-pay

## 2021-05-26 DIAGNOSIS — M6281 Muscle weakness (generalized): Secondary | ICD-10-CM

## 2021-05-26 DIAGNOSIS — G8929 Other chronic pain: Secondary | ICD-10-CM

## 2021-05-26 DIAGNOSIS — M25512 Pain in left shoulder: Secondary | ICD-10-CM | POA: Diagnosis not present

## 2021-05-26 NOTE — Therapy (Signed)
[place], [place], [address] Phone: [phone]   Fax:  [phone]  Physical Therapy Treatment  Patient Details  Name: Jacqueline Mata MRN: [id number] Date of Birth: 1940/12/09 Referring Provider (PT): Sheryle Hail, [place]   Encounter Date: 05/26/2021   PT End of Session - 05/26/21 1016     Visit Number 5    Number of Visits 9    Date for PT Re-Evaluation 06/20/21    Authorization Type [employer]: Kx mod at 15th visit, FOTO 6th and 10th visit.    PT Start Time 1016    PT Stop Time 1054    PT Time Calculation (min) 38 min    Activity Tolerance Patient tolerated treatment well    Behavior During Therapy [facility] for tasks assessed/performed             Past Medical History:  Diagnosis Date   Anxiety    Arthritis    breast cancer 2005   bilateral   Cataract 01/04/13 and [id number]   Complication of anesthesia    dizziness    Diabetes mellitus    pt denies at preop of 11/05/2020    Hyperlipidemia    hypothyroidism    Hypothyroidism    Personal history of chemotherapy    Personal history of radiation therapy    PONV (postoperative nausea and vomiting)    Pre-diabetes     Past Surgical History:  Procedure Laterality Date   bilateral knee replacement s     BREAST BIOPSY Left 2005   positive   BREAST BIOPSY Right 2005   positive   BREAST LUMPECTOMY Left 2005   BREAST LUMPECTOMY Right 2005   gumm surgery      JOINT REPLACEMENT  2013   by Dr. Marry Guan   righ tknee arthroscopy      TONSILLECTOMY     TOTAL HIP ARTHROPLASTY Right 11/12/2020   Procedure: RIGHT TOTAL HIP ARTHROPLASTY ANTERIOR APPROACH;  Surgeon: Melrose Nakayama, MD;  Location: [facility];  Service: Orthopedics;  Laterality: Right;    There were no vitals filed for this visit.   Subjective Assessment - 05/26/21 1016     Subjective "I had a couple quations regarding the exercises as when do use the green band. I did my  exercises with the green band and I was sore for 1 -2 days. today I am feeling pretty good."    Diagnostic tests 03/26/2021 - IMPRESSION:  1. Large full-thickness tear of the anterior half of the  supraspinatus tendon with 8 mm of retraction as well as a high-grade  partial-thickness articular surface tear of the posterior half of  the supraspinatus tendon.  2. Severe tendinosis of the infraspinatus tendon.  3. Mild tendinosis of the subscapularis tendon with a high-grade  partial-thickness tear.  4. Complete tear of the intra-articular portion of the long head of  the biceps tendon.    Patient Stated Goals to be as functional as possible    Currently in Pain? No/denies    Pain Orientation Left    Pain Frequency Intermittent    Aggravating Factors  doing too much    Pain Relieving Factors moving exercise.                [facility] PT Assessment - 05/26/21 0001       Strength   Left Shoulder Flexion 4-/5    Left Shoulder Extension 5/5    Left Shoulder ABduction 4/5    Left Shoulder Internal  Rotation 4/5    Left Shoulder External Rotation 4-/5                         [facility] Adult PT Treatment/Exercise:  Therapeutic Exercise: UBE L3 x 5 min: FWD/BWD 2:30 Upper trap 1 x 30  bil   Rows 1 x 20 with GTB Extension 1 x 20 GTB 2 x 10 shoulder IR/ ER with GTB Scaption 2 x 10 with 1#, verbal cues for proper form  Bicep curl in to overhead press 1 x 10 4#           PT Education - 05/26/21 1054     Education Details Reviewed and updated HEP today    Person(s) Educated Patient    Methods Explanation;Verbal cues;Handout    Comprehension Verbalized understanding;Verbal cues required              PT Short Term Goals - 05/26/21 1030       PT SHORT TERM GOAL #1   Title pt to be IND with initial HEP    Period Weeks    Status Achieved               PT Long Term Goals - 05/26/21 1030       PT LONG TERM GOAL #1   Title increase L shoulder gross strength  to >/= 4+/5 with </= 1/10 pain during testing    Baseline reassessment on 8/29 progressing strength increased to 4/10    Period Weeks      PT LONG TERM GOAL #2   Title pt to be able to lift/ lower to and from and overhead shelf and from floor<>waist >/= 8 # with </= 1/10 pain for functional strength required for ADLs    Status On-going      PT LONG TERM GOAL #3   Title increase FOTO score to >/= 65% to demo improvement in function      PT LONG TERM GOAL #4   Title pt to be able to don/ doff clothing and personal hygiene with no report of limtation per personal goal    Status On-going      PT LONG TERM GOAL #5   Title pt to be IND with all HEP and is able to maintain and progress current LOF IND    Status On-going                   Plan - 05/26/21 1054     Clinical Impression Statement felma continues to make good progress with physical therapy. She does continue to report intermittent pain that fluctuates depending on positioning and activity. Focused on progressing strength which she did well with, noting fatigue at the end of the last few reps. She is making good progress with strength and toward her goals, but would benefit from continued PT to maximize her function.             Patient will benefit from skilled therapeutic intervention in order to improve the following deficits and impairments:  Improper body mechanics, Increased muscle spasms, Decreased strength, Postural dysfunction, Pain, Decreased activity tolerance  Visit Diagnosis: Chronic left shoulder pain  Muscle weakness (generalized)     Problem List Patient Active Problem List   Diagnosis Date Noted   Chronic pain in left shoulder 05/06/2021   CKD stage 3 secondary to diabetes ([place]) 05/06/2021   Mixed stress and urge urinary incontinence 05/06/2021   Pain, dental 02/10/2021  Tear of left biceps muscle 01/08/2021   Preoperative evaluation to rule out surgical contraindication 11/04/2020    Primary osteoarthritis of right hip 07/20/2020   Pre-ulcerative corn or callous 07/09/2020   Elevated liver enzymes 04/16/2020   Short-term memory loss 07/31/2019   Educated about COVID-19 virus infection 01/30/2019   Osteopenia determined by x-ray 03/02/2018   Myalgia due to statin 01/02/2018   Stage 2 carcinoma of breast, ER+, unspecified laterality ([place]) 04/29/2017   Cervical radiculopathy due to degenerative joint disease of spine 03/19/2015   Acromioclavicular joint arthritis 02/19/2015   Rotator cuff dysfunction 01/08/2015   Shoulder pain, bilateral 12/21/2014   Sciatica of left side 10/17/2014   Encounter for Medicare annual wellness exam 05/22/2014   Basal cell carcinoma of leg 12/20/2013   Overweight (BMI 25.0-29.9) 08/08/2013   Balance problem due to vestibular dysfunction 05/01/2012   Status post total knee replacement 05/01/2012   Breast cancer in situ 11/09/2011   Acquired hypothyroidism 07/26/2011   Hyperlipidemia associated with type 2 diabetes mellitus ([place]) 07/23/2011   Starr Lake PT, DPT, LAT, ATC  05/26/21  11:01 AM     [place] [facility] [address] [place], [place], [address] Phone: [phone]   Fax:  [phone]  Name: BRIANNIA COERS MRN: [id number] Date of Birth: Jun 30, 1941

## 2021-05-27 DIAGNOSIS — M62830 Muscle spasm of back: Secondary | ICD-10-CM | POA: Diagnosis not present

## 2021-05-27 DIAGNOSIS — M9903 Segmental and somatic dysfunction of lumbar region: Secondary | ICD-10-CM | POA: Diagnosis not present

## 2021-05-27 DIAGNOSIS — M9901 Segmental and somatic dysfunction of cervical region: Secondary | ICD-10-CM | POA: Diagnosis not present

## 2021-05-27 DIAGNOSIS — M5416 Radiculopathy, lumbar region: Secondary | ICD-10-CM | POA: Diagnosis not present

## 2021-05-29 ENCOUNTER — Ambulatory Visit
Admission: RE | Admit: 2021-05-29 | Discharge: 2021-05-29 | Disposition: A | Discharge status: Home / Self Care | Payer: PPO | Source: Ambulatory Visit | Attending: Internal Medicine | Admitting: Internal Medicine

## 2021-05-29 ENCOUNTER — Other Ambulatory Visit: Payer: Self-pay

## 2021-05-29 DIAGNOSIS — Z1231 Encounter for screening mammogram for malignant neoplasm of breast: Secondary | ICD-10-CM | POA: Diagnosis not present

## 2021-06-03 ENCOUNTER — Ambulatory Visit: Payer: PPO | Attending: Surgical | Admitting: Physical Therapy

## 2021-06-03 ENCOUNTER — Encounter: Payer: Self-pay | Admitting: Physical Therapy

## 2021-06-03 ENCOUNTER — Other Ambulatory Visit: Payer: Self-pay

## 2021-06-03 DIAGNOSIS — C50911 Malignant neoplasm of unspecified site of right female breast: Secondary | ICD-10-CM | POA: Diagnosis not present

## 2021-06-03 DIAGNOSIS — M6281 Muscle weakness (generalized): Secondary | ICD-10-CM | POA: Diagnosis not present

## 2021-06-03 DIAGNOSIS — M25512 Pain in left shoulder: Secondary | ICD-10-CM | POA: Diagnosis not present

## 2021-06-03 DIAGNOSIS — C50912 Malignant neoplasm of unspecified site of left female breast: Secondary | ICD-10-CM | POA: Diagnosis not present

## 2021-06-03 DIAGNOSIS — G8929 Other chronic pain: Secondary | ICD-10-CM | POA: Insufficient documentation

## 2021-06-03 NOTE — Therapy (Signed)
[place], [place], [address] Phone: [phone]   Fax:  [phone]  Physical Therapy Treatment  Patient Details  Name: Jacqueline Mata MRN: [id number] Date of Birth: Jul 29, 1941 Referring Provider (PT): Sheryle Hail, [place]   Encounter Date: 06/03/2021   PT End of Session - 06/03/21 0936     Visit Number 6    Number of Visits 9    Date for PT Re-Evaluation 06/20/21    Authorization Type [employer]: Kx mod at 15th visit, FOTO 6th and 10th visit.    Progress Note Due on Visit 10    PT Start Time [phone]    PT Stop Time 1015    PT Time Calculation (min) 44 min    Activity Tolerance Patient tolerated treatment well    Behavior During Therapy [facility] for tasks assessed/performed             Past Medical History:  Diagnosis Date   Anxiety    Arthritis    breast cancer 2005   bilateral   Cataract 01/04/13 and [id number]   Complication of anesthesia    dizziness    Diabetes mellitus    pt denies at preop of 11/05/2020    Hyperlipidemia    hypothyroidism    Hypothyroidism    Personal history of chemotherapy    Personal history of radiation therapy    PONV (postoperative nausea and vomiting)    Pre-diabetes     Past Surgical History:  Procedure Laterality Date   bilateral knee replacement s     BREAST BIOPSY Left 2005   positive   BREAST BIOPSY Right 2005   positive   BREAST LUMPECTOMY Left 2005   BREAST LUMPECTOMY Right 2005   gumm surgery      JOINT REPLACEMENT  2013   by Dr. Marry Guan   righ tknee arthroscopy      TONSILLECTOMY     TOTAL HIP ARTHROPLASTY Right 11/12/2020   Procedure: RIGHT TOTAL HIP ARTHROPLASTY ANTERIOR APPROACH;  Surgeon: Melrose Nakayama, MD;  Location: [facility];  Service: Orthopedics;  Laterality: Right;    There were no vitals filed for this visit.   Subjective Assessment - 06/03/21 0954     Subjective " I have been doing pretty good. No pain today, but I do  notice some sorneess along the joints in my hands. I think its from the band exercise I've been trying to challenge myself."    Diagnostic tests 03/26/2021 - IMPRESSION:  1. Large full-thickness tear of the anterior half of the  supraspinatus tendon with 8 mm of retraction as well as a high-grade  partial-thickness articular surface tear of the posterior half of  the supraspinatus tendon.  2. Severe tendinosis of the infraspinatus tendon.  3. Mild tendinosis of the subscapularis tendon with a high-grade  partial-thickness tear.  4. Complete tear of the intra-articular portion of the long head of  the biceps tendon.    Currently in Pain? Yes    Pain Score 0-No pain    Pain Orientation Left                [facility] PT Assessment - 06/03/21 0001       Assessment   Medical Diagnosis rotator cuff tear    Referring Provider (PT) [place], Amber, PA-C      Observation/Other Assessments   Focus on Therapeutic Outcomes (FOTO)  61%  [place] Adult PT Treatment/Exercise:  Therapeutic Exercise: UBE L5 x 5 min : FWD/BWD x 2: 30 ea Upper trap / levator scapulae stretch 1 x 30 sec build Supine resisted bicep eccentrics 1 x 12  with RTB Seated shoulder scaption 1 x 12 with 1# Bicep curl into overhead press 1 x  10 4# LUE only, 1 x 10 2# bil   Manual Therapy:  MTPR along the biceps brachii             PT Education - 06/03/21 0953     Education Details Reviewed 6th visit              PT Short Term Goals - 05/26/21 1030       PT SHORT TERM GOAL #1   Title pt to be IND with initial HEP    Period Weeks    Status Achieved               PT Long Term Goals - 05/26/21 1030       PT LONG TERM GOAL #1   Title increase L shoulder gross strength to >/= 4+/5 with </= 1/10 pain during testing    Baseline reassessment on 8/29 progressing strength increased to 4/10    Period Weeks      PT LONG TERM GOAL #2   Title pt to be able to  lift/ lower to and from and overhead shelf and from floor<>waist >/= 8 # with </= 1/10 pain for functional strength required for ADLs    Status On-going      PT LONG TERM GOAL #3   Title increase FOTO score to >/= 65% to demo improvement in function      PT LONG TERM GOAL #4   Title pt to be able to don/ doff clothing and personal hygiene with no report of limtation per personal goal    Status On-going      PT LONG TERM GOAL #5   Title pt to be IND with all HEP and is able to maintain and progress current LOF IND    Status On-going                   Plan - 06/03/21 1015     Clinical Impression Statement no report of pain today. cotninued working on shoulder strengthening working in to overhead which she did well with but does fatigue quickly. she did well with continued rotator cuff strengthening with emphasis on the supraspinatus. no repot of pain during or following session.    PT Treatment/Interventions ADLs/Self Care Home Management;Cryotherapy;Therapeutic activities;Therapeutic exercise;Balance training;Neuromuscular re-education;Manual techniques;Patient/family education;Passive range of motion;Dry needling;Taping    PT Next Visit Plan review/ updated HEP PRN- hx of Cx no heat or E-stim. gross shoulder strength, scapular stability, bicep strengthening.update HEP next session.    PT Home Exercise Plan [id number] - upper trap stretch, wall push up with plus, scapular retraction, shoulder IR/ER, money, rows, scapular protraction in supine, supine D2, scaption    Consulted and Agree with Plan of Care Patient             Patient will benefit from skilled therapeutic intervention in order to improve the following deficits and impairments:  Improper body mechanics, Increased muscle spasms, Decreased strength, Postural dysfunction, Pain, Decreased activity tolerance  Visit Diagnosis: No diagnosis found.     Problem List Patient Active Problem List   Diagnosis Date Noted    Chronic pain in left shoulder 05/06/2021   CKD stage 3  secondary to diabetes ([place]) 05/06/2021   Mixed stress and urge urinary incontinence 05/06/2021   Pain, dental 02/10/2021   Tear of left biceps muscle 01/08/2021   Preoperative evaluation to rule out surgical contraindication 11/04/2020   Primary osteoarthritis of right hip 07/20/2020   Pre-ulcerative corn or callous 07/09/2020   Elevated liver enzymes 04/16/2020   Short-term memory loss 07/31/2019   Educated about COVID-19 virus infection 01/30/2019   Osteopenia determined by x-ray 03/02/2018   Myalgia due to statin 01/02/2018   Stage 2 carcinoma of breast, ER+, unspecified laterality ([place]) 04/29/2017   Cervical radiculopathy due to degenerative joint disease of spine 03/19/2015   Acromioclavicular joint arthritis 02/19/2015   Rotator cuff dysfunction 01/08/2015   Shoulder pain, bilateral 12/21/2014   Sciatica of left side 10/17/2014   Encounter for Medicare annual wellness exam 05/22/2014   Basal cell carcinoma of leg 12/20/2013   Overweight (BMI 25.0-29.9) 08/08/2013   Balance problem due to vestibular dysfunction 05/01/2012   Status post total knee replacement 05/01/2012   Breast cancer in situ 11/09/2011   Acquired hypothyroidism 07/26/2011   Hyperlipidemia associated with type 2 diabetes mellitus ([place]) 07/23/2011    Starr Lake PT, DPT, LAT, ATC  06/03/21  10:20 AM      [place] [facility] [address] [place], [place], [address] Phone: [phone]   Fax:  [phone]  Name: Jacqueline Mata MRN: [id number] Date of Birth: 11-20-1940

## 2021-06-10 ENCOUNTER — Other Ambulatory Visit: Payer: Self-pay

## 2021-06-10 ENCOUNTER — Ambulatory Visit: Payer: PPO | Admitting: Physical Therapy

## 2021-06-10 ENCOUNTER — Encounter: Payer: Self-pay | Admitting: Physical Therapy

## 2021-06-10 DIAGNOSIS — G8929 Other chronic pain: Secondary | ICD-10-CM

## 2021-06-10 DIAGNOSIS — M25512 Pain in left shoulder: Secondary | ICD-10-CM

## 2021-06-10 DIAGNOSIS — M6281 Muscle weakness (generalized): Secondary | ICD-10-CM

## 2021-06-10 NOTE — Therapy (Signed)
[place], [place], [address] Phone: [phone]   Fax:  [phone]  Physical Therapy Treatment  Patient Details  Name: Jacqueline Mata MRN: [id number] Date of Birth: 1940/11/18 Referring Provider (PT): Sheryle Hail, [place]   Encounter Date: 06/10/2021   PT End of Session - 06/10/21 1102     Visit Number 7    Number of Visits 9    Date for PT Re-Evaluation 06/20/21    Authorization Type [employer]: Kx mod at 15th visit, [place] visit.    PT Start Time 1101    Activity Tolerance Patient tolerated treatment well    Behavior During Therapy [facility] for tasks assessed/performed             Past Medical History:  Diagnosis Date   Anxiety    Arthritis    breast cancer 2005   bilateral   Cataract 01/04/13 and [id number]   Complication of anesthesia    dizziness    Diabetes mellitus    pt denies at preop of 11/05/2020    Hyperlipidemia    hypothyroidism    Hypothyroidism    Personal history of chemotherapy    Personal history of radiation therapy    PONV (postoperative nausea and vomiting)    Pre-diabetes     Past Surgical History:  Procedure Laterality Date   bilateral knee replacement s     BREAST BIOPSY Left 2005   positive   BREAST BIOPSY Right 2005   positive   BREAST LUMPECTOMY Left 2005   BREAST LUMPECTOMY Right 2005   gumm surgery      JOINT REPLACEMENT  2013   by Dr. Marry Guan   righ tknee arthroscopy      TONSILLECTOMY     TOTAL HIP ARTHROPLASTY Right 11/12/2020   Procedure: RIGHT TOTAL HIP ARTHROPLASTY ANTERIOR APPROACH;  Surgeon: Melrose Nakayama, MD;  Location: [facility];  Service: Orthopedics;  Laterality: Right;    There were no vitals filed for this visit.   Subjective Assessment - 06/10/21 1102     Subjective "I am doing pretty good today. I did do some exercises with the bands at home and I was kind of sore when I was doing my bicep curls."    Diagnostic tests 03/26/2021  - IMPRESSION:  1. Large full-thickness tear of the anterior half of the  supraspinatus tendon with 8 mm of retraction as well as a high-grade  partial-thickness articular surface tear of the posterior half of  the supraspinatus tendon.  2. Severe tendinosis of the infraspinatus tendon.  3. Mild tendinosis of the subscapularis tendon with a high-grade  partial-thickness tear.  4. Complete tear of the intra-articular portion of the long head of  the biceps tendon.    Currently in Pain? Yes    Pain Orientation Left    Aggravating Factors  unsure                   [facility] Adult PT Treatment/Exercise:  Therapeutic Exercise: UBE L3 x 5 min (FWD/ BWD x 2:30) L Upper trap/ levator scapulae stretch 1 x 30 Rows 2 x 20 with GTB Shoulder IR/ER 2 x 15 with GTB Standing  shoulder scaption 2 x 12 with 1# Bicep curl into overhead press 2 x 10 with 2# bil Reaching into cabinet with 4# LUE only 1 x 10 lower shelf, 1 x 10 middle shelf, and 1 x 10 upper shelf    Manual Therapy:  N/A  Neuromuscular re-ed:  N/A  Therapeutic Activity: N/A  Self Care N/A  ITEMS NOT PERFORMED TODAY:  MTPR along the biceps brachii                       PT Short Term Goals - 05/26/21 1030       PT SHORT TERM GOAL #1   Title pt to be IND with initial HEP    Period Weeks    Status Achieved               PT Long Term Goals - 05/26/21 1030       PT LONG TERM GOAL #1   Title increase L shoulder gross strength to >/= 4+/5 with </= 1/10 pain during testing    Baseline reassessment on 8/29 progressing strength increased to 4/10    Period Weeks      PT LONG TERM GOAL #2   Title pt to be able to lift/ lower to and from and overhead shelf and from floor<>waist >/= 8 # with </= 1/10 pain for functional strength required for ADLs    Status On-going      PT LONG TERM GOAL #3   Title increase FOTO score to >/= 65% to demo improvement in function      PT LONG TERM GOAL #4   Title pt to  be able to don/ doff clothing and personal hygiene with no report of limtation per personal goal    Status On-going      PT LONG TERM GOAL #5   Title pt to be IND with all HEP and is able to maintain and progress current LOF IND    Status On-going                   Plan - 06/10/21 1133     Clinical Impression Statement Jacqueline Mata is making excellent progress with physical therapy reporting no pain today. continued working on scapular stability and working on bicep strengtheing. She did very well with the session and noted no pain or soreness following session. Anticipate seeing pt 1 -2 more visits and anticipate discharge.    PT Treatment/Interventions ADLs/Self Care Home Management;Cryotherapy;Therapeutic activities;Therapeutic exercise;Balance training;Neuromuscular re-education;Manual techniques;Patient/family education;Passive range of motion;Dry needling;Taping    PT Next Visit Plan review/ updated HEP PRN- hx of Cx no heat or E-stim. gross shoulder strength, scapular stability, bicep strengthening.update HEP next session.    PT Home Exercise Plan [id number] - upper trap stretch, wall push up with plus, scapular retraction, shoulder IR/ER, money, rows, scapular protraction in supine, supine D2, scaption             Patient will benefit from skilled therapeutic intervention in order to improve the following deficits and impairments:  Improper body mechanics, Increased muscle spasms, Decreased strength, Postural dysfunction, Pain, Decreased activity tolerance  Visit Diagnosis: No diagnosis found.     Problem List Patient Active Problem List   Diagnosis Date Noted   Chronic pain in left shoulder 05/06/2021   CKD stage 3 secondary to diabetes ([place]) 05/06/2021   Mixed stress and urge urinary incontinence 05/06/2021   Pain, dental 02/10/2021   Tear of left biceps muscle 01/08/2021   Preoperative evaluation to rule out surgical contraindication 11/04/2020   Primary  osteoarthritis of right hip 07/20/2020   Pre-ulcerative corn or callous 07/09/2020   Elevated liver enzymes 04/16/2020   Short-term memory loss 07/31/2019   Educated about COVID-19 virus infection 01/30/2019   Osteopenia determined by x-ray  03/02/2018   Myalgia due to statin 01/02/2018   Stage 2 carcinoma of breast, ER+, unspecified laterality ([place]) 04/29/2017   Cervical radiculopathy due to degenerative joint disease of spine 03/19/2015   Acromioclavicular joint arthritis 02/19/2015   Rotator cuff dysfunction 01/08/2015   Shoulder pain, bilateral 12/21/2014   Sciatica of left side 10/17/2014   Encounter for Medicare annual wellness exam 05/22/2014   Basal cell carcinoma of leg 12/20/2013   Overweight (BMI 25.0-29.9) 08/08/2013   Balance problem due to vestibular dysfunction 05/01/2012   Status post total knee replacement 05/01/2012   Breast cancer in situ 11/09/2011   Acquired hypothyroidism 07/26/2011   Hyperlipidemia associated with type 2 diabetes mellitus ([place]) 07/23/2011   Starr Lake PT, DPT, LAT, ATC  06/10/21  11:50 AM      [place] [address] [place], [place], [address] Phone: [phone]   Fax:  [phone]  Name: Jacqueline Mata MRN: [id number] Date of Birth: Jul 28, 1941

## 2021-06-13 ENCOUNTER — Ambulatory Visit: Payer: PPO | Admitting: Internal Medicine

## 2021-06-17 ENCOUNTER — Encounter: Payer: Self-pay | Admitting: Physical Therapy

## 2021-06-17 ENCOUNTER — Other Ambulatory Visit: Payer: Self-pay

## 2021-06-17 ENCOUNTER — Ambulatory Visit: Payer: PPO | Admitting: Physical Therapy

## 2021-06-17 DIAGNOSIS — M9903 Segmental and somatic dysfunction of lumbar region: Secondary | ICD-10-CM | POA: Diagnosis not present

## 2021-06-17 DIAGNOSIS — M62830 Muscle spasm of back: Secondary | ICD-10-CM | POA: Diagnosis not present

## 2021-06-17 DIAGNOSIS — E1122 Type 2 diabetes mellitus with diabetic chronic kidney disease: Secondary | ICD-10-CM | POA: Diagnosis not present

## 2021-06-17 DIAGNOSIS — M9901 Segmental and somatic dysfunction of cervical region: Secondary | ICD-10-CM | POA: Diagnosis not present

## 2021-06-17 DIAGNOSIS — N183 Chronic kidney disease, stage 3 unspecified: Secondary | ICD-10-CM | POA: Diagnosis not present

## 2021-06-17 DIAGNOSIS — G8929 Other chronic pain: Secondary | ICD-10-CM

## 2021-06-17 DIAGNOSIS — M25512 Pain in left shoulder: Secondary | ICD-10-CM

## 2021-06-17 DIAGNOSIS — E785 Hyperlipidemia, unspecified: Secondary | ICD-10-CM | POA: Diagnosis not present

## 2021-06-17 DIAGNOSIS — M5416 Radiculopathy, lumbar region: Secondary | ICD-10-CM | POA: Diagnosis not present

## 2021-06-17 DIAGNOSIS — N1831 Chronic kidney disease, stage 3a: Secondary | ICD-10-CM | POA: Diagnosis not present

## 2021-06-17 DIAGNOSIS — M6281 Muscle weakness (generalized): Secondary | ICD-10-CM

## 2021-06-17 NOTE — Therapy (Signed)
[place], [place], [address] Phone: [phone]   Fax:  [phone]  Physical Therapy Treatment  Patient Details  Name: Jacqueline Mata MRN: [id number] Date of Birth: Jun 13, 1941 Referring Provider (PT): Sheryle Hail, [place]   Encounter Date: 06/17/2021   PT End of Session - 06/17/21 1104     Visit Number 8    Number of Visits 9    Date for PT Re-Evaluation 06/20/21    Authorization Type [employer]: Kx mod at 15th visit, [place] visit.    Progress Note Due on Visit 10    PT Start Time 1103    PT Stop Time 1143    PT Time Calculation (min) 40 min    Activity Tolerance Patient tolerated treatment well    Behavior During Therapy [facility] for tasks assessed/performed             Past Medical History:  Diagnosis Date   Anxiety    Arthritis    breast cancer 2005   bilateral   Cataract 01/04/13 and 04/30/37   Complication of anesthesia    dizziness    Diabetes mellitus    pt denies at preop of 11/05/2020    Hyperlipidemia    hypothyroidism    Hypothyroidism    Personal history of chemotherapy    Personal history of radiation therapy    PONV (postoperative nausea and vomiting)    Pre-diabetes     Past Surgical History:  Procedure Laterality Date   bilateral knee replacement s     BREAST BIOPSY Left 2005   positive   BREAST BIOPSY Right 2005   positive   BREAST LUMPECTOMY Left 2005   BREAST LUMPECTOMY Right 2005   gumm surgery      JOINT REPLACEMENT  2013   by Dr. Marry Guan   righ tknee arthroscopy      TONSILLECTOMY     TOTAL HIP ARTHROPLASTY Right 11/12/2020   Procedure: RIGHT TOTAL HIP ARTHROPLASTY ANTERIOR APPROACH;  Surgeon: Melrose Nakayama, MD;  Location: [facility];  Service: Orthopedics;  Laterality: Right;    There were no vitals filed for this visit.   Subjective Assessment - 06/17/21 1105     Subjective " I am doing pretty good, no pain or issues. I did get a high resistance  band for exercises at home."    Diagnostic tests 03/26/2021 - IMPRESSION:  1. Large full-thickness tear of the anterior half of the  supraspinatus tendon with 8 mm of retraction as well as a high-grade  partial-thickness articular surface tear of the posterior half of  the supraspinatus tendon.  2. Severe tendinosis of the infraspinatus tendon.  3. Mild tendinosis of the subscapularis tendon with a high-grade  partial-thickness tear.  4. Complete tear of the intra-articular portion of the long head of  the biceps tendon.    Currently in Pain? No/denies                         [facility] Adult PT Treatment/Exercise:  Therapeutic Exercise: UBE L 4 x 66min (FWD/BWD x 2:30) Rows with machine 1 x 15 15# Lat pulldown 1 x 15 15# Verbal cues for proper form Reaching into cabinet 1 x 10 lower cabinet, 1 x 10 middle cabinet, and 1 x 10 upper cabinet with 4# Horizontal abduction 2 x 10 with RTB  Manual Therapy:  N/A  Neuromuscular re-ed: N/A  Therapeutic Activity: N/A  Self Care: Addressed pt specific exercises  regarding machines used at the gym.   ITEMS NOT PERFORMED TODAY:                PT Education - 06/17/21 1143     Education Details patient questions regarding gym specific equipment    Person(s) Educated Patient    Methods Explanation;Verbal cues    Comprehension Verbalized understanding;Verbal cues required              PT Short Term Goals - 05/26/21 1030       PT SHORT TERM GOAL #1   Title pt to be IND with initial HEP    Period Weeks    Status Achieved               PT Long Term Goals - 05/26/21 1030       PT LONG TERM GOAL #1   Title increase L shoulder gross strength to >/= 4+/5 with </= 1/10 pain during testing    Baseline reassessment on 8/29 progressing strength increased to 4/10    Period Weeks      PT LONG TERM GOAL #2   Title pt to be able to lift/ lower to and from and overhead shelf and from floor<>waist >/= 8 # with </=  1/10 pain for functional strength required for ADLs    Status On-going      PT LONG TERM GOAL #3   Title increase FOTO score to >/= 65% to demo improvement in function      PT LONG TERM GOAL #4   Title pt to be able to don/ doff clothing and personal hygiene with no report of limtation per personal goal    Status On-going      PT LONG TERM GOAL #5   Title pt to be IND with all HEP and is able to maintain and progress current LOF IND    Status On-going                   Plan - 06/17/21 1114     Clinical Impression Statement Mrs Melching continues to make great progress and has been consistent with her HEP. Addressed pt specific questions regarding gym specific equipment. continued working on posterior shoulder strengthening and reaching overhead which she contineus to do very well with. Plan to review HEP and anticipate d/C next session.    PT Treatment/Interventions ADLs/Self Care Home Management;Cryotherapy;Therapeutic activities;Therapeutic exercise;Balance training;Neuromuscular re-education;Manual techniques;Patient/family education;Passive range of motion;Dry needling;Taping    PT Next Visit Plan review/ updated HEP PRN- hx of Cx no heat or E-stim. gross shoulder strength, scapular stability, bicep strengthening.update HEP next session.    PT Home Exercise Plan [id number] - upper trap stretch, wall push up with plus, scapular retraction, shoulder IR/ER, money, rows, scapular protraction in supine, supine D2, scaption    Consulted and Agree with Plan of Care Patient             Patient will benefit from skilled therapeutic intervention in order to improve the following deficits and impairments:  Improper body mechanics, Increased muscle spasms, Decreased strength, Postural dysfunction, Pain, Decreased activity tolerance  Visit Diagnosis: Chronic left shoulder pain  Muscle weakness (generalized)     Problem List Patient Active Problem List   Diagnosis Date Noted    Chronic pain in left shoulder 05/06/2021   CKD stage 3 secondary to diabetes ([place]) 05/06/2021   Mixed stress and urge urinary incontinence 05/06/2021   Pain, dental 02/10/2021   Tear of left biceps muscle 01/08/2021  Preoperative evaluation to rule out surgical contraindication 11/04/2020   Primary osteoarthritis of right hip 07/20/2020   Pre-ulcerative corn or callous 07/09/2020   Elevated liver enzymes 04/16/2020   Short-term memory loss 07/31/2019   Educated about COVID-19 virus infection 01/30/2019   Osteopenia determined by x-ray 03/02/2018   Myalgia due to statin 01/02/2018   Stage 2 carcinoma of breast, ER+, unspecified laterality ([place]) 04/29/2017   Cervical radiculopathy due to degenerative joint disease of spine 03/19/2015   Acromioclavicular joint arthritis 02/19/2015   Rotator cuff dysfunction 01/08/2015   Shoulder pain, bilateral 12/21/2014   Sciatica of left side 10/17/2014   Encounter for Medicare annual wellness exam 05/22/2014   Basal cell carcinoma of leg 12/20/2013   Overweight (BMI 25.0-29.9) 08/08/2013   Balance problem due to vestibular dysfunction 05/01/2012   Status post total knee replacement 05/01/2012   Breast cancer in situ 11/09/2011   Acquired hypothyroidism 07/26/2011   Hyperlipidemia associated with type 2 diabetes mellitus ([place]) 07/23/2011   Starr Lake PT, DPT, LAT, ATC  06/17/21  11:44 AM     [facility] [address] [place], [place], [address] Phone: [phone]   Fax:  [phone]  Name: LORIEL DIEHL MRN: [id number] Date of Birth: Feb 13, 1941

## 2021-06-24 ENCOUNTER — Encounter: Payer: Self-pay | Admitting: Physical Therapy

## 2021-06-24 ENCOUNTER — Ambulatory Visit: Payer: PPO | Admitting: Physical Therapy

## 2021-06-24 ENCOUNTER — Other Ambulatory Visit: Payer: Self-pay

## 2021-06-24 DIAGNOSIS — M25512 Pain in left shoulder: Secondary | ICD-10-CM

## 2021-06-24 DIAGNOSIS — G8929 Other chronic pain: Secondary | ICD-10-CM

## 2021-06-24 DIAGNOSIS — M6281 Muscle weakness (generalized): Secondary | ICD-10-CM

## 2021-06-24 NOTE — Therapy (Signed)
[place], [place], [address] Phone: [phone]   Fax:  [phone]  Physical Therapy Treatment / Discharge  Patient Details  Name: Jacqueline Mata MRN: [id number] Date of Birth: [birth year] Referring Provider (PT): Sheryle Hail, [place]   Encounter Date: 06/24/2021   PT End of Session - 06/24/21 1417     Visit Number 9    Number of Visits 9    Date for PT Re-Evaluation 06/24/21    Authorization Type [employer]: Kx mod at 15th visit, [place] visit.    PT Start Time 5465    PT Stop Time 1450    PT Time Calculation (min) 33 min    Activity Tolerance Patient tolerated treatment well    Behavior During Therapy [facility] for tasks assessed/performed             Past Medical History:  Diagnosis Date   Anxiety    Arthritis    breast cancer 2005   bilateral   Cataract 01/04/13 and 0/3/54   Complication of anesthesia    dizziness    Diabetes mellitus    pt denies at preop of 11/05/2020    Hyperlipidemia    hypothyroidism    Hypothyroidism    Personal history of chemotherapy    Personal history of radiation therapy    PONV (postoperative nausea and vomiting)    Pre-diabetes     Past Surgical History:  Procedure Laterality Date   bilateral knee replacement s     BREAST BIOPSY Left 2005   positive   BREAST BIOPSY Right 2005   positive   BREAST LUMPECTOMY Left 2005   BREAST LUMPECTOMY Right 2005   gumm surgery      JOINT REPLACEMENT  2013   by Dr. Marry Guan   righ tknee arthroscopy      TONSILLECTOMY     TOTAL HIP ARTHROPLASTY Right 11/12/2020   Procedure: RIGHT TOTAL HIP ARTHROPLASTY ANTERIOR APPROACH;  Surgeon: Melrose Nakayama, MD;  Location: [facility];  Service: Orthopedics;  Laterality: Right;    There were no vitals filed for this visit.   Subjective Assessment - 06/24/21 1421     Subjective " I have no pain overall I am doing pretty well."    Diagnostic tests 03/26/2021 - IMPRESSION:   1. Large full-thickness tear of the anterior half of the  supraspinatus tendon with 8 mm of retraction as well as a high-grade  partial-thickness articular surface tear of the posterior half of  the supraspinatus tendon.  2. Severe tendinosis of the infraspinatus tendon.  3. Mild tendinosis of the subscapularis tendon with a high-grade  partial-thickness tear.  4. Complete tear of the intra-articular portion of the long head of  the biceps tendon.    Currently in Pain? No/denies    Aggravating Factors  N/A    Pain Relieving Factors exercise.                [facility] PT Assessment - 06/24/21 0001       Assessment   Medical Diagnosis rotator cuff tear    Referring Provider (PT) [place], Amber, PA-C      Observation/Other Assessments   Focus on Therapeutic Outcomes (FOTO)  65%      Strength   Left Shoulder Flexion 4/5    Left Shoulder ABduction 4/5    Left Shoulder Internal Rotation 4+/5    Left Shoulder External Rotation 4+/5  PT Education - 06/24/21 1421     Education Details Reviewed HEP today and discussed progression of strengthening with increased reps, sets and resistance to maximize endurance. reviewed lifting mechanics. Reviewed final FOTO assessment, goals and her current level of function compared to her initial assessment.    Person(s) Educated Patient    Methods Explanation;Verbal cues;Handout    Comprehension Verbalized understanding;Verbal cues required              PT Short Term Goals - 05/26/21 1030       PT SHORT TERM GOAL #1   Title pt to be IND with initial HEP    Period Weeks    Status Achieved               PT Long Term Goals - 06/24/21 1432       PT LONG TERM GOAL #1   Title increase L shoulder gross strength to >/= 4+/5 with </= 1/10 pain during testing    Status Partially Met      PT LONG TERM GOAL #2   Title pt to be able to lift/ lower to and from and overhead shelf  and from floor<>waist >/= 8 # with </= 1/10 pain for functional strength required for ADLs    Status Achieved      PT LONG TERM GOAL #3   Title increase FOTO score to >/= 65% to demo improvement in function    Status Partially Met      PT LONG TERM GOAL #4   Title pt to be able to don/ doff clothing and personal hygiene with no report of limtation per personal goal    Status Partially Met      PT LONG TERM GOAL #5   Title pt to be IND with all HEP and is able to maintain and progress current LOF IND    Status Achieved                   Plan - 06/24/21 1506     Clinical Impression Statement Mrs Kohls has made excellent progress with physical therapy reporting no pain today and increase gross shoulder strength. She does report some soreness in the shoulder with overhead activities that is fleeting once out of the position. She has met or partially met all goals today. extensively reviewed her HEP and progression of strengthening. She is able to maintain and progress current LOF IND.    PT Next Visit Plan D/C    PT Home Exercise Plan [email or web address] -    Consulted and Agree with Plan of Care Patient             Patient will benefit from skilled therapeutic intervention in order to improve the following deficits and impairments:  Improper body mechanics, Increased muscle spasms, Decreased strength, Postural dysfunction, Pain, Decreased activity tolerance  Visit Diagnosis: Chronic left shoulder pain - Plan: PT plan of care cert/re-cert  Muscle weakness (generalized) - Plan: PT plan of care cert/re-cert     Problem List Patient Active Problem List   Diagnosis Date Noted   Chronic pain in left shoulder 05/06/2021   CKD stage 3 secondary to diabetes ([place]) 05/06/2021   Mixed stress and urge urinary incontinence 05/06/2021   Pain, dental 02/10/2021   Tear of left biceps muscle 01/08/2021   Preoperative evaluation to rule out surgical contraindication 11/04/2020   Primary  osteoarthritis of right hip 07/20/2020   Pre-ulcerative corn or callous 07/09/2020   Elevated liver enzymes 04/16/2020  Short-term memory loss 07/31/2019   Educated about COVID-19 virus infection 01/30/2019   Osteopenia determined by x-ray 03/02/2018   Myalgia due to statin 01/02/2018   Stage 2 carcinoma of breast, ER+, unspecified laterality ([place]) 04/29/2017   Cervical radiculopathy due to degenerative joint disease of spine 03/19/2015   Acromioclavicular joint arthritis 02/19/2015   Rotator cuff dysfunction 01/08/2015   Shoulder pain, bilateral 12/21/2014   Sciatica of left side 10/17/2014   Encounter for Medicare annual wellness exam 05/22/2014   Basal cell carcinoma of leg 12/20/2013   Overweight (BMI 25.0-29.9) 08/08/2013   Balance problem due to vestibular dysfunction 05/01/2012   Status post total knee replacement 05/01/2012   Breast cancer in situ 11/09/2011   Acquired hypothyroidism 07/26/2011   Hyperlipidemia associated with type 2 diabetes mellitus ([place]) 07/23/2011    Starr Lake, PT 06/24/2021, 3:13 PM  [facility] [address] [place], [place], [address] Phone: [phone]   Fax:  [phone]  Name: Jacqueline Mata MRN: [id number] Date of Birth: May 31, 1941     PHYSICAL THERAPY DISCHARGE SUMMARY  Visits from Start of Care: 9  Current functional level related to goals / functional outcomes: See goals, FOTO 64%    Remaining deficits: See assessment   Education / Equipment: HEP, theraband, posture   Patient agrees to discharge. Patient goals were partially met. Patient is being discharged due to meeting the stated rehab goals.  Tevyn Codd PT, DPT, LAT, ATC  06/24/21  3:13 PM

## 2021-06-24 NOTE — Patient Instructions (Signed)
Access Code: [email or web address] URL: [email or web address] Date: 06/24/2021 Prepared by: Starr Lake  Exercises Seated Upper Trapezius Stretch - 2 x daily - 7 x weekly - 2 reps - 2 sets - 30 seconds hold Gentle Levator Scapulae Stretch - 2 x daily - 7 x weekly - 2 reps - 2 sets - 30 seconds hold Wall Push Up with Plus - 1 x daily - 7 x weekly - 10 reps - 2 sets - 5 hold Supine Single Arm Scapular Protraction - 1 x daily - 7 x weekly - 2 sets - 15 reps Seated Scapular Retraction - 1 x daily - 7 x weekly - 10 reps - 2 sets - 5 seconds hold Shoulder Internal Rotation - 1 x daily - 7 x weekly - 3 sets - 15 reps Shoulder External Rotation - 1 x daily - 7 x weekly - 3 sets - 15 reps Scapular retraction with ER (MONEY) - 1 x daily - 7 x weekly - 3 sets - 15 reps Standing Shoulder Row with Anchored Resistance - 1 x daily - 7 x weekly - 3 sets - 15 reps Shoulder Extension with Resistance - 1 x daily - 7 x weekly - 3 sets - 15 reps Supine PNF D2 Flexion with Resistance - 1 x daily - 7 x weekly - 3 sets - 15 reps Standing Shoulder Scaption - 1 x daily - 7 x weekly - 3 sets - 15 reps Standing Bicep Curls with Resistance - 1 x daily - 7 x weekly - 2 sets - 15 reps Seated Elbow Extension with Resistance - 1 x daily - 7 x weekly - 2 sets - 10 reps Seated Overhead Press - 1 x daily - 7 x weekly - 2 sets - 10 reps

## 2021-07-01 ENCOUNTER — Inpatient Hospital Stay: Payer: PPO | Admitting: Hematology & Oncology

## 2021-07-01 ENCOUNTER — Inpatient Hospital Stay: Payer: PPO | Attending: Hematology & Oncology

## 2021-07-01 ENCOUNTER — Encounter: Payer: Self-pay | Admitting: Hematology & Oncology

## 2021-07-01 ENCOUNTER — Telehealth: Payer: Self-pay | Admitting: *Deleted

## 2021-07-01 ENCOUNTER — Other Ambulatory Visit: Payer: Self-pay

## 2021-07-01 VITALS — BP 135/79 | HR 78 | Temp 98.3°F | Resp 18 | Wt 176.0 lb

## 2021-07-01 DIAGNOSIS — Z853 Personal history of malignant neoplasm of breast: Secondary | ICD-10-CM | POA: Diagnosis not present

## 2021-07-01 DIAGNOSIS — Z170 Estrogen receptor positive status [ER+]: Secondary | ICD-10-CM

## 2021-07-01 DIAGNOSIS — C50919 Malignant neoplasm of unspecified site of unspecified female breast: Secondary | ICD-10-CM | POA: Diagnosis not present

## 2021-07-01 LAB — CMP (CANCER CENTER ONLY)
ALT: 21 U/L (ref 0–44)
AST: 19 U/L (ref 15–41)
Albumin: 4.6 g/dL (ref 3.5–5.0)
Alkaline Phosphatase: 68 U/L (ref 38–126)
Anion gap: 10 (ref 5–15)
BUN: 23 mg/dL (ref 8–23)
CO2: 27 mmol/L (ref 22–32)
Calcium: 10.3 mg/dL (ref 8.9–10.3)
Chloride: 102 mmol/L (ref 98–111)
Creatinine: 1 mg/dL (ref 0.44–1.00)
GFR, Estimated: 57 mL/min — ABNORMAL LOW (ref >60)
Glucose, Bld: 115 mg/dL — ABNORMAL HIGH (ref 70–99)
Potassium: 4.4 mmol/L (ref 3.5–5.1)
Sodium: 139 mmol/L (ref 135–145)
Total Bilirubin: 0.8 mg/dL (ref 0.3–1.2)
Total Protein: 7.2 g/dL (ref 6.5–8.1)

## 2021-07-01 LAB — CBC WITH DIFFERENTIAL (CANCER CENTER ONLY)
Abs Immature Granulocytes: 0.03 10*3/uL (ref 0.00–0.07)
Basophils Absolute: 0.1 10*3/uL (ref 0.0–0.1)
Basophils Relative: 1 %
Eosinophils Absolute: 0.3 10*3/uL (ref 0.0–0.5)
Eosinophils Relative: 3 %
HCT: 43 % (ref 36.0–46.0)
Hemoglobin: 14.7 g/dL (ref 12.0–15.0)
Immature Granulocytes: 0 %
Lymphocytes Relative: 25 %
Lymphs Abs: 2.2 10*3/uL (ref 0.7–4.0)
MCH: 31.1 pg (ref 26.0–34.0)
MCHC: 34.2 g/dL (ref 30.0–36.0)
MCV: 90.9 fL (ref 80.0–100.0)
Monocytes Absolute: 0.6 10*3/uL (ref 0.1–1.0)
Monocytes Relative: 7 %
Neutro Abs: 5.8 10*3/uL (ref 1.7–7.7)
Neutrophils Relative %: 64 %
Platelet Count: 179 10*3/uL (ref 150–400)
RBC: 4.73 MIL/uL (ref 3.87–5.11)
RDW: 12.9 % (ref 11.5–15.5)
WBC Count: 9 10*3/uL (ref 4.0–10.5)
nRBC: 0 % (ref 0.0–0.2)
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
## 2021-07-01 NOTE — Telephone Encounter (Signed)
Per 07/01/21 los gave upcoming appointments - confirmed - print calendar 

## 2021-07-02 ENCOUNTER — Telehealth: Payer: Self-pay | Admitting: Internal Medicine

## 2021-07-02 ENCOUNTER — Other Ambulatory Visit: Payer: Self-pay | Admitting: Internal Medicine

## 2021-07-02 NOTE — Telephone Encounter (Signed)
Rejection Reason - Other - patient cancelled" Jacqueline Mata said on Jul 02, 2021 9:54 AM  Pt appt was on 06/26/2021 at 11:40am  Msg from [facility] [place] [facility]

## 2021-07-09 DIAGNOSIS — M62830 Muscle spasm of back: Secondary | ICD-10-CM | POA: Diagnosis not present

## 2021-07-09 DIAGNOSIS — M9903 Segmental and somatic dysfunction of lumbar region: Secondary | ICD-10-CM | POA: Diagnosis not present

## 2021-07-09 DIAGNOSIS — M5416 Radiculopathy, lumbar region: Secondary | ICD-10-CM | POA: Diagnosis not present

## 2021-07-09 DIAGNOSIS — M9901 Segmental and somatic dysfunction of cervical region: Secondary | ICD-10-CM | POA: Diagnosis not present

## 2021-07-10 ENCOUNTER — Other Ambulatory Visit: Payer: Self-pay

## 2021-07-10 ENCOUNTER — Ambulatory Visit (INDEPENDENT_AMBULATORY_CARE_PROVIDER_SITE_OTHER): Payer: PPO | Admitting: Internal Medicine

## 2021-07-10 VITALS — BP 128/64 | HR 79 | Temp 97.8°F | Ht 63.5 in | Wt 176.4 lb

## 2021-07-10 DIAGNOSIS — E559 Vitamin D deficiency, unspecified: Secondary | ICD-10-CM

## 2021-07-10 DIAGNOSIS — E785 Hyperlipidemia, unspecified: Secondary | ICD-10-CM

## 2021-07-10 DIAGNOSIS — Z23 Encounter for immunization: Secondary | ICD-10-CM | POA: Diagnosis not present

## 2021-07-10 DIAGNOSIS — N3946 Mixed incontinence: Secondary | ICD-10-CM

## 2021-07-10 DIAGNOSIS — E1122 Type 2 diabetes mellitus with diabetic chronic kidney disease: Secondary | ICD-10-CM | POA: Diagnosis not present

## 2021-07-10 DIAGNOSIS — N183 Chronic kidney disease, stage 3 unspecified: Secondary | ICD-10-CM

## 2021-07-10 DIAGNOSIS — E1169 Type 2 diabetes mellitus with other specified complication: Secondary | ICD-10-CM

## 2021-07-10 DIAGNOSIS — H832X3 Labyrinthine dysfunction, bilateral: Secondary | ICD-10-CM | POA: Diagnosis not present

## 2021-07-10 DIAGNOSIS — E039 Hypothyroidism, unspecified: Secondary | ICD-10-CM | POA: Diagnosis not present

## 2021-07-10 NOTE — Assessment & Plan Note (Addendum)
GFR remains < 60 ml/min on recent CMP.  She was referred to nephrology but cancelled Sept initial consult and saw a [facility] nephrologist Dr Radene Knee instead.  Told she could use NSAIDs infrequently for neck pain    Lab Results  Component Value Date   CREATININE 1.00 07/01/2021   Lab Results  Component Value Date   MICROALBUR <0.7 05/06/2021   MICROALBUR <0.7 04/15/2020

## 2021-07-10 NOTE — Progress Notes (Signed)
Subjective:  Patient ID: Jacqueline Mata, female    DOB: [birth year]  Age: 80 y.o. MRN: [id number]  CC: The primary encounter diagnosis was Need for immunization against influenza. Diagnoses of CKD stage 3 secondary to diabetes ([place]), Hyperlipidemia associated with type 2 diabetes mellitus ([place]), Vitamin D deficiency, Acquired hypothyroidism, Balance problem due to vestibular dysfunction of both ears, and Mixed stress and urge urinary incontinence were also pertinent to this visit.  HPI DELILAH MULGREW presents for follow up on chronic issues Chief Complaint  Patient presents with   Follow-up    Discuss covid vaccine as well.   This visit occurred during the SARS-CoV-2 public health emergency.  Safety protocols were in place, including screening questions prior to the visit, additional usage of staff PPE, and extensive cleaning of exam room while observing appropriate contact time as indicated for disinfecting solutions.   CKD:  Saw nephrologist at  [facility]  Had PT on left shoulder and notes improved strength and ROM. Her Neck pain improved as well ; she is using mobic every 8 days or so. .   And salon  pas with lidcoaine  Going to [employer] 3/week    Overactive bladder "  with episode of urge incontinence   Outpatient Medications Prior to Visit  Medication Sig Dispense Refill   Blood Glucose Monitoring Suppl KIT by Does not apply route.     Cholecalciferol (VITAMIN D) 50 MCG (2000 UT) tablet Take 2,000 Units by mouth daily.     folic acid (FOLVITE) 169 MCG tablet Take 800 mcg by mouth daily.     glucose blood (ACCU-CHEK AVIVA PLUS) test strip Use to check blood sugar once daily. 100 each 5   Lancets (ACCU-CHEK MULTICLIX) lancets Use once daily to check blood sugars   Dx:E11.9 100 each 3   levothyroxine (SYNTHROID) 88 MCG tablet TAKE 1 TABLET BY MOUTH DAILY BEFORE BREAKFAST 90 tablet 0   loratadine (CLARITIN) 10 MG tablet Take 10 mg by mouth daily.     meloxicam (MOBIC) 15 MG tablet  Take 15 mg by mouth daily.     Probiotic Product (PROBIOTIC DAILY PO) Take 1 capsule by mouth daily.     amoxicillin (AMOXIL) 500 MG capsule Take 2,000 mg by mouth See admin instructions. Dental procedures. Takes $RemoveBeforeDEI'2000mg'arfMbsbjmSfjIjuu$  one hour prior to dental procedures. (Patient not taking: Reported on 07/10/2021)     acetaminophen (TYLENOL) 500 MG tablet Take 1,000 mg by mouth every 6 (six) hours as needed for moderate pain. (Patient not taking: Reported on 07/01/2021)     No facility-administered medications prior to visit.    Review of Systems;  Patient denies headache, fevers, malaise, unintentional weight loss, skin rash, eye pain, sinus congestion and sinus pain, sore throat, dysphagia,  hemoptysis , cough, dyspnea, wheezing, chest pain, palpitations, orthopnea, edema, abdominal pain, nausea, melena, diarrhea, constipation, flank pain, dysuria, hematuria, urinary  Frequency, nocturia, numbness, tingling, seizures,  Focal weakness, Loss of consciousness,  Tremor, insomnia, depression, anxiety, and suicidal ideation.      Objective:  BP 128/64   Pulse 79   Temp 97.8 F (36.6 C) (Skin)   Ht 5' 3.5" (1.613 m)   Wt 176 lb 6.4 oz (80 kg)   SpO2 96%   BMI 30.76 kg/m   BP Readings from Last 3 Encounters:  07/10/21 128/64  07/01/21 135/79  05/06/21 128/84    Wt Readings from Last 3 Encounters:  07/11/21 176 lb (79.8 kg)  07/10/21 176 lb 6.4 oz (80  kg)  07/01/21 176 lb (79.8 kg)    General appearance: alert, cooperative and appears stated age Ears: normal TM's and external ear canals both ears Throat: lips, mucosa, and tongue normal; teeth and gums normal Neck: no adenopathy, no carotid bruit, supple, symmetrical, trachea midline and thyroid not enlarged, symmetric, no tenderness/mass/nodules Back: symmetric, no curvature. ROM normal. No CVA tenderness. Lungs: clear to auscultation bilaterally Heart: regular rate and rhythm, S1, S2 normal, no murmur, click, rub or gallop Abdomen: soft,  non-tender; bowel sounds normal; no masses,  no organomegaly Pulses: 2+ and symmetric Skin: Skin color, texture, turgor normal. No rashes or lesions Lymph nodes: Cervical, supraclavicular, and axillary nodes normal.  Lab Results  Component Value Date   HGBA1C 6.8 (H) 05/02/2021   HGBA1C 6.5 10/31/2020   HGBA1C 7.5 (H) 07/19/2020    Lab Results  Component Value Date   CREATININE 1.00 07/01/2021   CREATININE 0.92 05/02/2021   CREATININE 0.91 12/31/2020    Lab Results  Component Value Date   WBC 9.0 07/01/2021   HGB 14.7 07/01/2021   HCT 43.0 07/01/2021   PLT 179 07/01/2021   GLUCOSE 115 (H) 07/01/2021   CHOL 239 (H) 05/02/2021   TRIG 84.0 05/02/2021   HDL 77.60 05/02/2021   LDLDIRECT 122.0 07/01/2017   LDLCALC 145 (H) 05/02/2021   ALT 21 07/01/2021   AST 19 07/01/2021   NA 139 07/01/2021   K 4.4 07/01/2021   CL 102 07/01/2021   CREATININE 1.00 07/01/2021   BUN 23 07/01/2021   CO2 27 07/01/2021   TSH 0.88 05/02/2021   INR 1.1 11/05/2020   HGBA1C 6.8 (H) 05/02/2021   MICROALBUR <0.7 05/06/2021    MM 3D SCREEN BREAST BILATERAL  Result Date: 06/01/2021 CLINICAL DATA:  Screening. EXAM: DIGITAL SCREENING BILATERAL MAMMOGRAM WITH TOMOSYNTHESIS AND CAD TECHNIQUE: Bilateral screening digital craniocaudal and mediolateral oblique mammograms were obtained. Bilateral screening digital breast tomosynthesis was performed. The images were evaluated with computer-aided detection. COMPARISON:  Previous exam(s). ACR Breast Density Category b: There are scattered areas of fibroglandular density. FINDINGS: There are no findings suspicious for malignancy. IMPRESSION: No mammographic evidence of malignancy. A result letter of this screening mammogram will be mailed directly to the patient. RECOMMENDATION: Screening mammogram in one year. (Code:SM-B-01Y) BI-RADS CATEGORY  1: Negative. Electronically Signed   By: Abelardo Diesel M.D.   On: 06/01/2021 10:10   Assessment & Plan:   Problem List  Items Addressed This Visit       Unprioritized   Acquired hypothyroidism   Relevant Orders   TSH   CKD stage 3 secondary to diabetes ([place])    GFR remains < 60 ml/min on recent CMP.  She was referred to nephrology but cancelled Sept initial consult and saw a [facility] nephrologist Dr Radene Knee instead.  Told she could use NSAIDs infrequently for neck pain    Lab Results  Component Value Date   CREATININE 1.00 07/01/2021   Lab Results  Component Value Date   MICROALBUR <0.7 05/06/2021   MICROALBUR <0.7 04/15/2020          Hyperlipidemia associated with type 2 diabetes mellitus ([place])     She continues to have excellent control with diet alone.   Patient is up-to-date on eye exams and foot exam is normal today.  Patient has no history of  Microalbuminuria but is due for repeat testing  . Patient is intolerant of statin therapy for CAD risk reduction due to myalgias. .  Lab Results  Component Value  Date   HGBA1C 6.8 (H) 05/02/2021       Relevant Orders   Hemoglobin A1c   Microalbumin / creatinine urine ratio   Comprehensive metabolic panel   Lipid panel   Balance problem due to vestibular dysfunction    Improved balance with PT      Mixed stress and urge urinary incontinence    Discussed available options for treatment , including myrbetriq       Other Visit Diagnoses     Need for immunization against influenza    -  Primary   Relevant Orders   Flu Vaccine QUAD High Dose(Fluad) (Completed)   Vitamin D deficiency       Relevant Orders   VITAMIN D 25 Hydroxy (Vit-D Deficiency, Fractures)       Medications Discontinued During This Encounter  Medication Reason   acetaminophen (TYLENOL) 500 MG tablet Patient Preference    Follow-up: No follow-ups on file.   [name], MD

## 2021-07-10 NOTE — Patient Instructions (Signed)
If you want to try a medication for overactive bladder.  We can try mybetriq  because it is covered   We can  repeat your A1c  and other labs in February  and then follow up with an appt

## 2021-07-11 ENCOUNTER — Ambulatory Visit (INDEPENDENT_AMBULATORY_CARE_PROVIDER_SITE_OTHER): Payer: PPO

## 2021-07-11 VITALS — Ht 63.5 in | Wt 176.0 lb

## 2021-07-11 DIAGNOSIS — Z0000 Encounter for general adult medical examination without abnormal findings: Secondary | ICD-10-CM

## 2021-07-11 NOTE — Progress Notes (Addendum)
Subjective:   Jacqueline Mata is a 80 y.o. female who presents for Medicare Annual (Subsequent) preventive examination.  Review of Systems    No ROS.  Medicare Wellness Virtual Visit.  Visual/audio telehealth visit, UTA vital signs.   See social history for additional risk factors.   Cardiac Risk Factors include: advanced age (>21men, >38 women);diabetes mellitus;hypertension     Objective:    Today's Vitals   07/11/21 1117  Weight: 176 lb (79.8 kg)  Height: 5' 3.5" (1.613 m)   Body mass index is 30.69 kg/m.  Advanced Directives 07/11/2021 07/01/2021 04/25/2021 12/31/2020 11/12/2020 11/05/2020 07/10/2020  Does Patient Have a Medical Advance Directive? Yes No Yes No Yes Yes Yes  Type of Advance Directive Living will - Healthcare Power of [place];Living will - Living will Living will Healthcare Power of [place];Living will  Does patient want to make changes to medical advance directive? - - - - - - No - Patient declined  Copy of Healthcare Power of Attorney in Chart? - - No - copy requested - - - No - copy requested  Would patient like information on creating a medical advance directive? - No - Patient declined - No - Patient declined - - -    Current Medications (verified) Outpatient Encounter Medications as of 07/11/2021  Medication Sig   amoxicillin (AMOXIL) 500 MG capsule Take 2,000 mg by mouth See admin instructions. Dental procedures. Takes 2000mg  one hour prior to dental procedures. (Patient not taking: Reported on 07/10/2021)   Blood Glucose Monitoring Suppl KIT by Does not apply route.   Cholecalciferol (VITAMIN D) 50 MCG (2000 UT) tablet Take 2,000 Units by mouth daily.   folic acid (FOLVITE) 800 MCG tablet Take 800 mcg by mouth daily.   glucose blood (ACCU-CHEK AVIVA PLUS) test strip Use to check blood sugar once daily.   Lancets (ACCU-CHEK MULTICLIX) lancets Use once daily to check blood sugars   Dx:E11.9   levothyroxine (SYNTHROID) 88 MCG tablet TAKE 1 TABLET BY  MOUTH DAILY BEFORE BREAKFAST   loratadine (CLARITIN) 10 MG tablet Take 10 mg by mouth daily.   meloxicam (MOBIC) 15 MG tablet Take 15 mg by mouth daily.   Probiotic Product (PROBIOTIC DAILY PO) Take 1 capsule by mouth daily.   No facility-administered encounter medications on file as of 07/11/2021.    Allergies (verified) Codeine, Ilevro [nepafenac], Lamisil [terbinafine], Mold extract [trichophyton mentagrophyte], Other, Maxitrol [neomycin-polymyxin-dexameth], Metformin and related, Molds & smuts, Neomycin, Neomycin-bacitracin zn-polymyx, Neomycin-polymyxin-hc, Tape, Tapentadol, Terbinafine, and Tramadol   History: Past Medical History:  Diagnosis Date   Anxiety    Arthritis    breast cancer 2005   bilateral   Cataract 01/04/13 and 03/01/13   Complication of anesthesia    dizziness    Diabetes mellitus    pt denies at preop of 11/05/2020    Hyperlipidemia    hypothyroidism    Hypothyroidism    Personal history of chemotherapy    Personal history of radiation therapy    PONV (postoperative nausea and vomiting)    Pre-diabetes    Past Surgical History:  Procedure Laterality Date   bilateral knee replacement s     BREAST BIOPSY Left 2005   positive   BREAST BIOPSY Right 2005   positive   BREAST LUMPECTOMY Left 2005   BREAST LUMPECTOMY Right 2005   gumm surgery      JOINT REPLACEMENT  2013   by Dr. 2014   righ tknee arthroscopy      TONSILLECTOMY  TOTAL HIP ARTHROPLASTY Right 11/12/2020   Procedure: RIGHT TOTAL HIP ARTHROPLASTY ANTERIOR APPROACH;  Surgeon: Melrose Nakayama, MD;  Location: [facility];  Service: Orthopedics;  Laterality: Right;   Family History  Problem Relation Age of Onset   Cancer Sister        lung, former tobacco   Cancer Brother 63       lung, thyroid, melanoma   Cancer Mother 72       ovarian   Diabetes Son    Breast cancer Maternal Grandfather    Social History   Socioeconomic History   Marital status: Divorced    Spouse name: Not on file    Number of children: Not on file   Years of education: Not on file   Highest education level: Not on file  Occupational History   Not on file  Tobacco Use   Smoking status: Former    Packs/day: 1.00    Years: 19.00    Pack years: 19.00    Types: Cigarettes    Start date: 01/02/1960    Quit date: 07/22/1978    Years since quitting: 43.0   Smokeless tobacco: Never   Tobacco comments:    quit 36 years ago  Vaping Use   Vaping Use: Never used  Substance and Sexual Activity   Alcohol use: Yes    Alcohol/week: 0.0 standard drinks    Comment: rare   Drug use: No   Sexual activity: Never  Other Topics Concern   Not on file  Social History Narrative   Not on file   Social Determinants of Health   Financial Resource Strain: Low Risk    Difficulty of Paying Living Expenses: Not hard at all  Food Insecurity: No Food Insecurity   Worried About Charity fundraiser in the Last Year: Never true   [place] in the Last Year: Never true  Transportation Needs: No Transportation Needs   Lack of Transportation (Medical): No   Lack of Transportation (Non-Medical): No  Physical Activity: Sufficiently Active   Days of Exercise per Week: 3 days   Minutes of Exercise per Session: 120 min  Stress: No Stress Concern Present   Feeling of Stress : Not at all  Social Connections: Unknown   Frequency of Communication with Friends and Family: More than three times a week   Frequency of Social Gatherings with Friends and Family: More than three times a week   Attends Religious Services: Not on Electrical engineer or Organizations: Not on file   Attends Archivist Meetings: Not on file   Marital Status: Not on file    Tobacco Counseling Counseling given: Not Answered Tobacco comments: quit 36 years ago   Clinical Intake:  Pre-visit preparation completed: Yes    Nutrition Risk Assessment: Does the patient have any non-healing wounds?  No  Has the patient had any  unintentional weight loss or weight gain?  No   Financial Strains and Diabetes Management: Does the patient want to be seen by Chronic Care Management for management of their diabetes?  No  Would the patient like to be referred to a Nutritionist or for Diabetic Management?  No     Diabetes: Yes (Followed by PCP)  How often do you need to have someone help you when you read instructions, pamphlets, or other written materials from your doctor or pharmacy?: 1 - Never    Interpreter Needed?: No      Activities of  Daily Living In your present state of health, do you have any difficulty performing the following activities: 07/11/2021 11/05/2020  Hearing? N N  Vision? N N  Difficulty concentrating or making decisions? N Y  Walking or climbing stairs? N Y  Dressing or bathing? N N  Doing errands, shopping? N N  Preparing Food and eating ? N -  Using the Toilet? N -  In the past six months, have you accidently leaked urine? Y -  Comment Managed with daily pad -  Do you have problems with loss of bowel control? N -  Managing your Medications? N -  Managing your Finances? N -  Housekeeping or managing your Housekeeping? N -  Some recent data might be hidden    Patient Care Team: [name], MD as PCP - General (Internal Medicine)  Indicate any recent Medical Services you may have received from other than [facility] providers in the past year (date may be approximate).     Assessment:   This is a routine wellness examination for [place].  I connected with Jacqueline Mata today by telephone and verified that I am speaking with the correct person using two identifiers. Location patient: home Location provider: work Persons participating in the virtual visit: patient, Marine scientist.    I discussed the limitations, risks, security and privacy concerns of performing an evaluation and management service by telephone and the availability of in person appointments. The patient expressed understanding and  verbally consented to this telephonic visit.    Interactive audio and video telecommunications were attempted between this provider and patient, however failed, due to patient having technical difficulties OR patient did not have access to video capability.  We continued and completed visit with audio only.  Some vital signs may be absent or patient reported.   Hearing/Vision screen Hearing Screening - Comments:: Stable and followed by Dr. Tami Ribas  C/O some difficulty hearing at times  Does not wear hearing aids Vision Screening - Comments:: Followed by [facility], Dr. Wallace Going Wears glasses when reading  No retinopathy  Bilateral cataracts extracted  Dietary issues and exercise activities discussed: Current Exercise Habits: Structured exercise class, Type of exercise: strength training/weights;walking;stretching, Time (Minutes): > 60, Frequency (Times/Week): 3, Weekly Exercise (Minutes/Week): 0, Intensity: Mild Low carb diet Good water intake   Goals Addressed               This Visit's Progress     Patient Stated     I would like to brush up on speaking the [place] language for brain health (pt-stated)        Increase physical activity (pt-stated)   On track     Walk more for exercise Weight goal 170lb       Depression Screen PHQ 2/9 Scores 07/11/2021 07/10/2021 02/10/2021 01/02/2021 11/04/2020 07/10/2020 06/30/2019  PHQ - 2 Score 0 0 0 1 0 0 0  PHQ- 9 Score - - - 6 - - -    Fall Risk Fall Risk  07/11/2021 07/10/2021 05/06/2021 02/10/2021 01/02/2021  Falls in the past year? 0 0 0 0 0  Comment - - - - -  Number falls in past yr: 0 0 - 0 -  Comment - - - - -  Injury with Fall? - 0 - 0 -  Risk for fall due to : - No Fall Risks - - -  Follow up Falls evaluation completed Falls evaluation completed Falls evaluation completed Falls evaluation completed Falls evaluation completed    FALL  RISK PREVENTION PERTAINING TO THE HOME: Any stairs in or around the home? Yes   If so, are there any without handrails? No  Home free of loose throw rugs in walkways, pet beds, electrical cords, etc? Yes  Adequate lighting in your home to reduce risk of falls? Yes   ASSISTIVE DEVICES UTILIZED TO PREVENT FALLS: Life alert? No  Use of a cane, walker or w/c? No  Grab bars in the bathroom? Yes  Shower chair or bench in bath tub? Yes  Elevated toilet seat or a handicapped toilet? Yes   TIMED UP AND GO: Was the test performed? No .   Cognitive Function: Patient is alert and oriented x3.  Enjoys Bible Study and using shorthand for transcribing.  Denies difficulty with memory loss, focusing, concentrating.  MMSE/6CIT deferred. Normal by direct communication/observation.  MMSE - Mini Mental State Exam 12/09/2015  Orientation to time 5  Orientation to Place 5  Registration 3  Attention/ Calculation 5  Recall 3  Language- name 2 objects 2  Language- repeat 1  Language- follow 3 step command 3  Language- read & follow direction 1  Write a sentence 1  Copy design 1  Total score 30     6CIT Screen 07/10/2020 06/30/2019  What Year? 0 points 0 points  What month? 0 points 0 points  What time? - 0 points  Count back from 20 - 0 points  Months in reverse 0 points 0 points  Repeat phrase - 0 points  Total Score - 0    Immunizations Immunization History  Administered Date(s) Administered   Fluad Quad(high Dose 65+) 07/10/2021   Influenza, High Dose Seasonal PF 07/01/2017, 06/30/2018, 06/19/2019   Influenza,inj,Quad PF,6+ Mos 07/28/2013, 07/18/2014   Influenza-Unspecified 07/04/2015, 07/30/2016, 06/25/2020   PFIZER(Purple Top)SARS-COV-2 Vaccination 10/05/2019, 10/26/2019, 07/05/2020, 01/10/2021   Pneumococcal Conjugate-13 05/21/2014   Pneumococcal Polysaccharide-23 09/29/2003, 02/12/2012   Tdap 08/01/2012   Zoster Recombinat (Shingrix) 07/26/2018, 12/02/2018, 06/22/2019   Zoster, Live 09/28/2009   Covid vaccine- plans to receive next booster  07/14/21.  Health Maintenance Health Maintenance  Topic Date Due   COVID-19 Vaccine (5 - Booster for [place] series) 07/27/2021 (Originally 05/12/2021)   Hepatitis C Screening  10/29/2021 (Originally 01/02/1959)   HEMOGLOBIN A1C  11/02/2021   OPHTHALMOLOGY EXAM  02/05/2022   FOOT EXAM  05/06/2022   URINE MICROALBUMIN  05/06/2022   MAMMOGRAM  05/29/2022   TETANUS/TDAP  08/01/2022   INFLUENZA VACCINE  Completed   DEXA SCAN  Completed   Zoster Vaccines- Shingrix  Completed   HPV VACCINES  Aged Out   Cologuard- deferred per patient.  Lung Cancer Screening: (Low Dose CT Chest recommended if Age 72-80 years, 30 pack-year currently smoking OR have quit w/in 15years.) does not qualify.   Hepatitis C Screening: does not qualify.  Vision Screening: Recommended annual ophthalmology exams for early detection of glaucoma and other disorders of the eye.  Dental Screening: Recommended annual dental exams for proper oral hygiene  Community Resource Referral / Chronic Care Management: CRR required this visit?  No   CCM required this visit?  No      Plan:   Keep all routine maintenance appointments.   I have personally reviewed and noted the following in the patient's chart:   Medical and social history Use of alcohol, tobacco or illicit drugs  Current medications and supplements including opioid prescriptions. Not currently taking opioid.  Functional ability and status Nutritional status Physical activity Advanced directives List of other physicians Hospitalizations, surgeries,  and ER visits in previous 12 months Vitals Screenings to include cognitive, depression, and falls Referrals and appointments  In addition, I have reviewed and discussed with patient certain preventive protocols, quality metrics, and best practice recommendations. A written personalized care plan for preventive services as well as general preventive health recommendations were provided to patient via mychart.      OBrien-Blaney, Leander Tout L, LPN   [id number]     I have reviewed the above information and agree with above.   Deborra Medina, MD

## 2021-07-11 NOTE — Patient Instructions (Addendum)
Jacqueline Mata , Thank you for taking time to come for your Medicare Wellness Visit. I appreciate your ongoing commitment to your health goals. Please review the following plan we discussed and let me know if I can assist you in the future.   These are the goals we discussed:  Goals       Patient Stated     I would like to brush up on speaking the [place] language for brain health (pt-stated)      Increase physical activity (pt-stated)      Walk more for exercise Weight goal 170lb        This is a list of the screening recommended for you and due dates:  Health Maintenance  Topic Date Due   COVID-19 Vaccine (5 - Booster for Pf[employer] series) 07/27/2021*   Hepatitis C Screening: USPSTF Recommendation to screen - Ages 18-79 yo.  10/29/2021*   Hemoglobin A1C  11/02/2021   Eye exam for diabetics  02/05/2022   Complete foot exam   05/06/2022   Urine Protein Check  05/06/2022   Mammogram  05/29/2022   Tetanus Vaccine  08/01/2022   Flu Shot  Completed   DEXA scan (bone density measurement)  Completed   Zoster (Shingles) Vaccine  Completed   HPV Vaccine  Aged Out  *Topic was postponed. The date shown is not the original due date.    Advanced directives: End of life planning; Advance aging; Advanced directives discussed.  Copy of current HCPOA/Living Will requested.    Conditions/risks identified: none new  Follow up in one year for your annual wellness visit    Preventive Care 65 Years and Older, Female Preventive care refers to lifestyle choices and visits with your health care provider that can promote health and wellness. What does preventive care include? A yearly physical exam. This is also called an annual well check. Dental exams once or twice a year. Routine eye exams. Ask your health care provider how often you should have your eyes checked. Personal lifestyle choices, including: Daily care of your teeth and gums. Regular physical activity. Eating a healthy  diet. Avoiding tobacco and drug use. Limiting alcohol use. Practicing safe sex. Taking low-dose aspirin every day. Taking vitamin and mineral supplements as recommended by your health care provider. What happens during an annual well check? The services and screenings done by your health care provider during your annual well check will depend on your age, overall health, lifestyle risk factors, and family history of disease. Counseling  Your health care provider may ask you questions about your: Alcohol use. Tobacco use. Drug use. Emotional well-being. Home and relationship well-being. Sexual activity. Eating habits. History of falls. Memory and ability to understand (cognition). Work and work Statistician. Reproductive health. Screening  You may have the following tests or measurements: Height, weight, and BMI. Blood pressure. Lipid and cholesterol levels. These may be checked every 5 years, or more frequently if you are over 89 years old. Skin check. Lung cancer screening. You may have this screening every year starting at age 70 if you have a 30-pack-year history of smoking and currently smoke or have quit within the past 15 years. Fecal occult blood test (FOBT) of the stool. You may have this test every year starting at age 6. Flexible sigmoidoscopy or colonoscopy. You may have a sigmoidoscopy every 5 years or a colonoscopy every 10 years starting at age 70. Hepatitis C blood test. Hepatitis B blood test. Sexually transmitted disease (STD) testing. Diabetes screening. This  is done by checking your blood sugar (glucose) after you have not eaten for a while (fasting). You may have this done every 1-3 years. Bone density scan. This is done to screen for osteoporosis. You may have this done starting at age 57. Mammogram. This may be done every 1-2 years. Talk to your health care provider about how often you should have regular mammograms. Talk with your health care provider about  your test results, treatment options, and if necessary, the need for more tests. Vaccines  Your health care provider may recommend certain vaccines, such as: Influenza vaccine. This is recommended every year. Tetanus, diphtheria, and acellular pertussis (Tdap, Td) vaccine. You may need a Td booster every 10 years. Zoster vaccine. You may need this after age 54. Pneumococcal 13-valent conjugate (PCV13) vaccine. One dose is recommended after age 55. Pneumococcal polysaccharide (PPSV23) vaccine. One dose is recommended after age 4. Talk to your health care provider about which screenings and vaccines you need and how often you need them. This information is not intended to replace advice given to you by your health care provider. Make sure you discuss any questions you have with your health care provider. Document Released: 10/11/2015 Document Revised: 06/03/2016 Document Reviewed: 07/16/2015 Elsevier Interactive Patient Education  2017 [place] Prevention in the Home Falls can cause injuries. They can happen to people of all ages. There are many things you can do to make your home safe and to help prevent falls. What can I do on the outside of my home? Regularly fix the edges of walkways and driveways and fix any cracks. Remove anything that might make you trip as you walk through a door, such as a raised step or threshold. Trim any bushes or trees on the path to your home. Use bright outdoor lighting. Clear any walking paths of anything that might make someone trip, such as rocks or tools. Regularly check to see if handrails are loose or broken. Make sure that both sides of any steps have handrails. Any raised decks and porches should have guardrails on the edges. Have any leaves, snow, or ice cleared regularly. Use sand or salt on walking paths during winter. Clean up any spills in your garage right away. This includes oil or grease spills. What can I do in the bathroom? Use  night lights. Install grab bars by the toilet and in the tub and shower. Do not use towel bars as grab bars. Use non-skid mats or decals in the tub or shower. If you need to sit down in the shower, use a plastic, non-slip stool. Keep the floor dry. Clean up any water that spills on the floor as soon as it happens. Remove soap buildup in the tub or shower regularly. Attach bath mats securely with double-sided non-slip rug tape. Do not have throw rugs and other things on the floor that can make you trip. What can I do in the bedroom? Use night lights. Make sure that you have a light by your bed that is easy to reach. Do not use any sheets or blankets that are too big for your bed. They should not hang down onto the floor. Have a firm chair that has side arms. You can use this for support while you get dressed. Do not have throw rugs and other things on the floor that can make you trip. What can I do in the kitchen? Clean up any spills right away. Avoid walking on wet floors. Keep items that  you use a lot in easy-to-reach places. If you need to reach something above you, use a strong step stool that has a grab bar. Keep electrical cords out of the way. Do not use floor polish or wax that makes floors slippery. If you must use wax, use non-skid floor wax. Do not have throw rugs and other things on the floor that can make you trip. What can I do with my stairs? Do not leave any items on the stairs. Make sure that there are handrails on both sides of the stairs and use them. Fix handrails that are broken or loose. Make sure that handrails are as long as the stairways. Check any carpeting to make sure that it is firmly attached to the stairs. Fix any carpet that is loose or worn. Avoid having throw rugs at the top or bottom of the stairs. If you do have throw rugs, attach them to the floor with carpet tape. Make sure that you have a light switch at the top of the stairs and the bottom of the  stairs. If you do not have them, ask someone to add them for you. What else can I do to help prevent falls? Wear shoes that: Do not have high heels. Have rubber bottoms. Are comfortable and fit you well. Are closed at the toe. Do not wear sandals. If you use a stepladder: Make sure that it is fully opened. Do not climb a closed stepladder. Make sure that both sides of the stepladder are locked into place. Ask someone to hold it for you, if possible. Clearly mark and make sure that you can see: Any grab bars or handrails. First and last steps. Where the edge of each step is. Use tools that help you move around (mobility aids) if they are needed. These include: Canes. Walkers. Scooters. Crutches. Turn on the lights when you go into a dark area. Replace any light bulbs as soon as they burn out. Set up your furniture so you have a clear path. Avoid moving your furniture around. If any of your floors are uneven, fix them. If there are any pets around you, be aware of where they are. Review your medicines with your doctor. Some medicines can make you feel dizzy. This can increase your chance of falling. Ask your doctor what other things that you can do to help prevent falls. This information is not intended to replace advice given to you by your health care provider. Make sure you discuss any questions you have with your health care provider. Document Released: 07/11/2009 Document Revised: 02/20/2016 Document Reviewed: 10/19/2014 Elsevier Interactive Patient Education  2017 [employer].

## 2021-07-12 NOTE — Assessment & Plan Note (Signed)
She continues to have excellent control with diet alone.   Patient is up-to-date on eye exams and foot exam is normal today.  Patient has no history of  Microalbuminuria but is due for repeat testing  . Patient is intolerant of statin therapy for CAD risk reduction due to myalgias. .  Lab Results  Component Value Date   HGBA1C 6.8 (H) 05/02/2021

## 2021-07-12 NOTE — Assessment & Plan Note (Signed)
Discussed available options for treatment , including [email or web address]

## 2021-07-12 NOTE — Assessment & Plan Note (Signed)
Improved balance with PT

## 2021-07-22 DIAGNOSIS — E559 Vitamin D deficiency, unspecified: Secondary | ICD-10-CM

## 2021-07-22 DIAGNOSIS — C44711 Basal cell carcinoma of skin of unspecified lower limb, including hip: Secondary | ICD-10-CM

## 2021-07-22 DIAGNOSIS — D0590 Unspecified type of carcinoma in situ of unspecified breast: Secondary | ICD-10-CM

## 2021-07-23 IMAGING — MR MR SHOULDER*L* W/ CM
6 series · 40 of 40 positions shown · IV contrast (agent unspecified)
Comparison: None.

CLINICAL DATA: Severe left shoulder pain status post twisting the
arm 6 months ago.

EXAM:
MR ARTHROGRAM OF THE LEFT SHOULDER
TECHNIQUE: Multiplanar, multisequence MR imaging of the left shoulder was
performed following the administration of intra-articular contrast.
CONTRAST:  See Injection Documentation.

[Series 3: T1 fat-sat · axial · left · 4.0mm · 0.55mm/px · z∈[-32,+88]mm · 6 of 25 slices shown (1 of 3)]
[im 1/25]
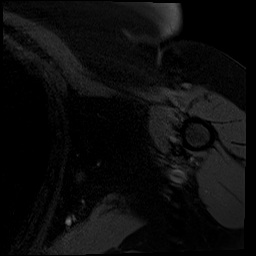
[im 5/25]
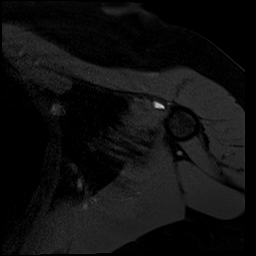
[im 10/25]
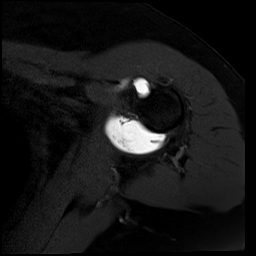
[im 15/25]
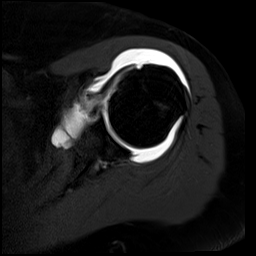
[im 20/25]
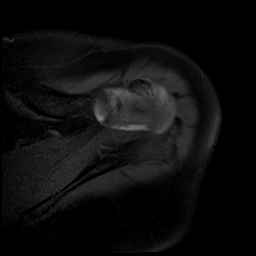
[im 25/25]
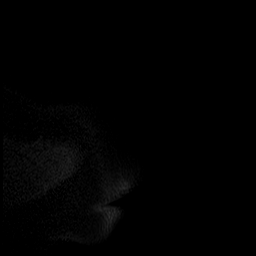

[Series 4: T1 fat-sat · oblique · left · 4.0mm · 0.55mm/px · 6 of 25 slices shown (2 of 3)]
[im 1/25]
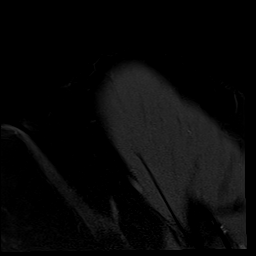
[im 5/25]
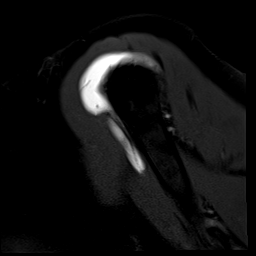
[im 10/25]
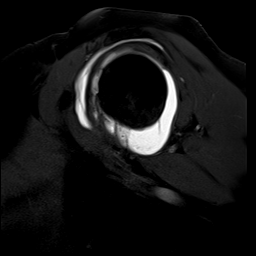
[im 15/25]
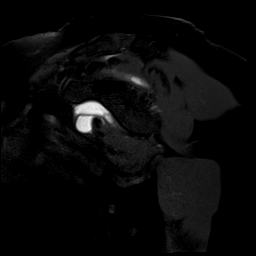
[im 20/25]
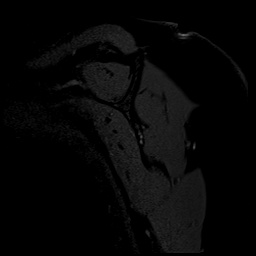
[im 25/25]
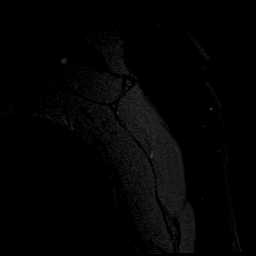

[Series 5: T2 fat-sat · oblique · left · 4.0mm · 0.55mm/px · 7 of 25 slices shown (1 of 2)]
[im 1/25]
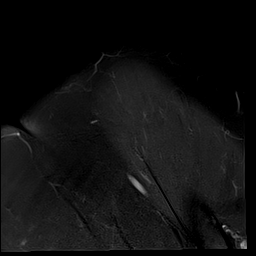
[im 5/25]
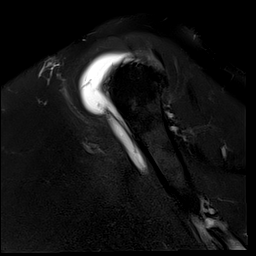
[im 9/25]
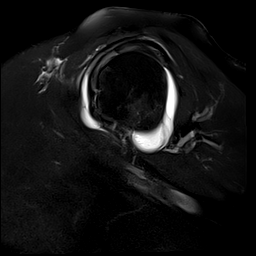
[im 13/25]
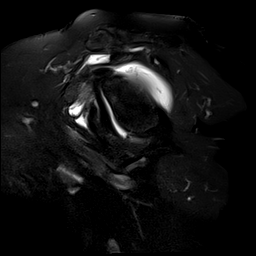
[im 17/25]
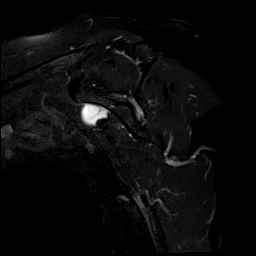
[im 21/25]
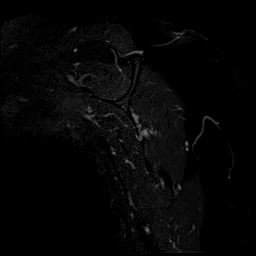
[im 25/25]
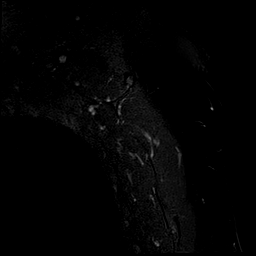

[Series 6: T1 · oblique · left · 4.0mm · 0.51mm/px · 7 of 25 slices shown]
[im 1/25]
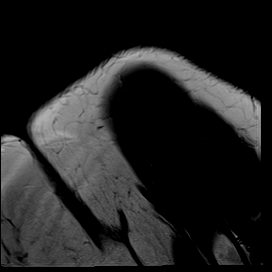
[im 5/25]
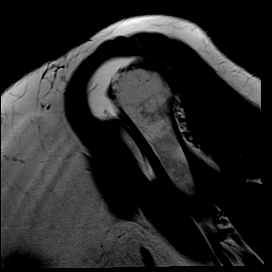
[im 9/25]
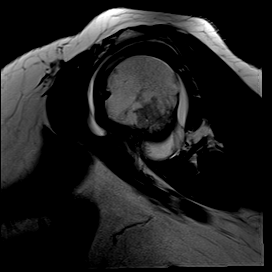
[im 13/25]
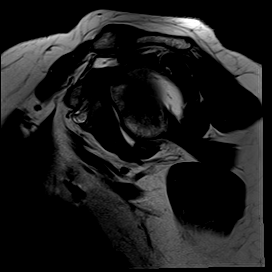
[im 17/25]
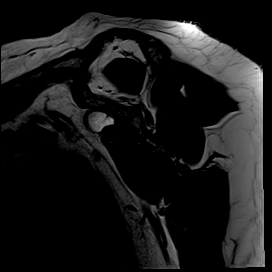
[im 21/25]
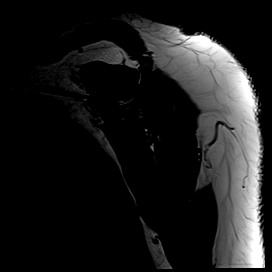
[im 25/25]
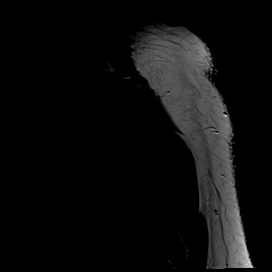

[Series 7: T2 fat-sat · oblique · left · 4.0mm · 0.55mm/px · 7 of 25 slices shown (2 of 2)]
[im 1/25]
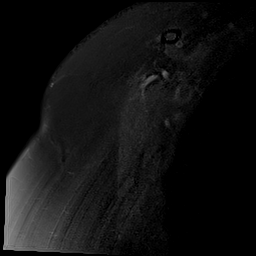
[im 5/25]
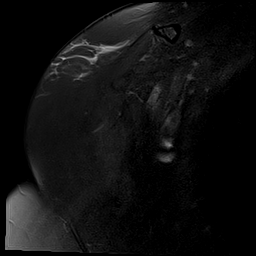
[im 9/25]
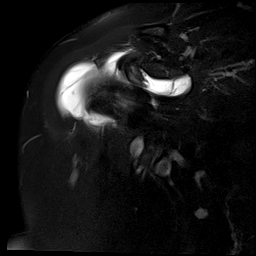
[im 13/25]
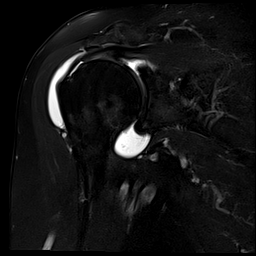
[im 17/25]
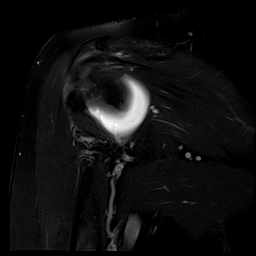
[im 21/25]
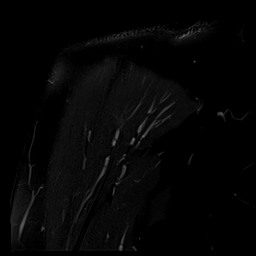
[im 25/25]
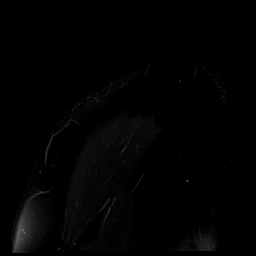

[Series 10: T1 fat-sat · sagittal · left · 4.0mm · 0.62mm/px · 7 of 26 slices shown (3 of 3)]
[im 1/26]
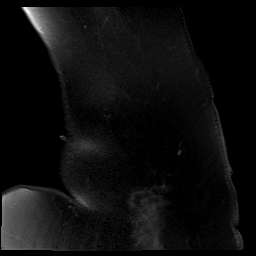
[im 5/26]
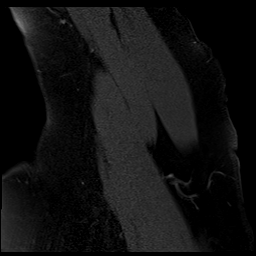
[im 9/26]
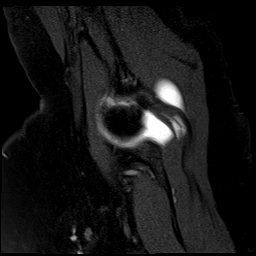
[im 13/26]
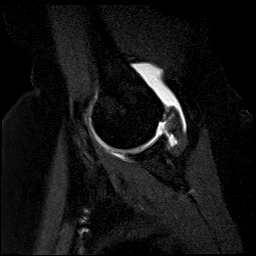
[im 17/26]
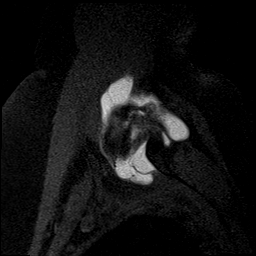
[im 21/26]
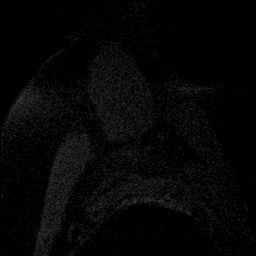
[im 26/26]
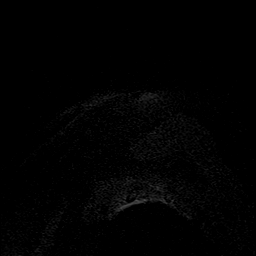

[40 of 40 positions shown; findings below may reference images not displayed]

FINDINGS: Rotator cuff: Large full-thickness tear of the anterior half of the
supraspinatus tendon with 8 mm of retraction as well as a high-grade
partial-thickness articular surface tear of the posterior half of
the supraspinatus tendon. Severe tendinosis of the infraspinatus
tendon. Teres minor tendon is intact. Mild tendinosis of the
subscapularis tendon with a high-grade partial-thickness tear.

Muscles: No muscle atrophy or edema. No intramuscular fluid
collection or hematoma.

Biceps Long Head: Complete tear of the intra-articular portion of
the long head of the biceps tendon.

Acromioclavicular Joint: Mild arthropathy of the acromioclavicular
joint. Type II acromion. Large amount of contrast in the
subacromial/subdeltoid bursa.

Glenohumeral Joint: Intraarticular contrast distending the joint
capsule. Normal glenohumeral ligaments. No chondral defect.

Labrum: Blunting of the superior labrum likely reflecting a tear.

Bones: No fracture or dislocation. No aggressive osseous lesion.

Other: No fluid collection or hematoma.
IMPRESSION: 1. Large full-thickness tear of the anterior half of the
supraspinatus tendon with 8 mm of retraction as well as a high-grade
partial-thickness articular surface tear of the posterior half of
the supraspinatus tendon.
2. Severe tendinosis of the infraspinatus tendon.
3. Mild tendinosis of the subscapularis tendon with a high-grade
partial-thickness tear.
4. Complete tear of the intra-articular portion of the long head of
the biceps tendon.

## 2021-07-23 IMAGING — RF DG ARTHROGRAM SHOULDER*L*
1 series · 4 of 4 positions shown · IV contrast (omnipaque)
Comparison: none

CLINICAL DATA: Left shoulder pain.

EXAM:
LEFT SHOULDER INJECTION UNDER FLUOROSCOPY
TECHNIQUE: An appropriate skin entrance site was determined. The site was
marked, prepped with Betadine, draped in the usual sterile fashion,
and infiltrated locally with 1% lidocaine. 22 gauge spinal needle
was advanced to the superomedial margin of the humeral head under
intermittent fluoroscopy. A standardized mixture of sterile saline,
Omnipaque 180, and gadolinium administered.
FLUOROSCOPY TIME:  Fluoroscopy Time:  2 minutes 18 seconds
Radiation Exposure Index (if provided by the fluoroscopic device):
15.9 mGy

[Series 1: cp_standard · 0.17mm/px · 4 of 61 frames shown]
[frame 1/61]
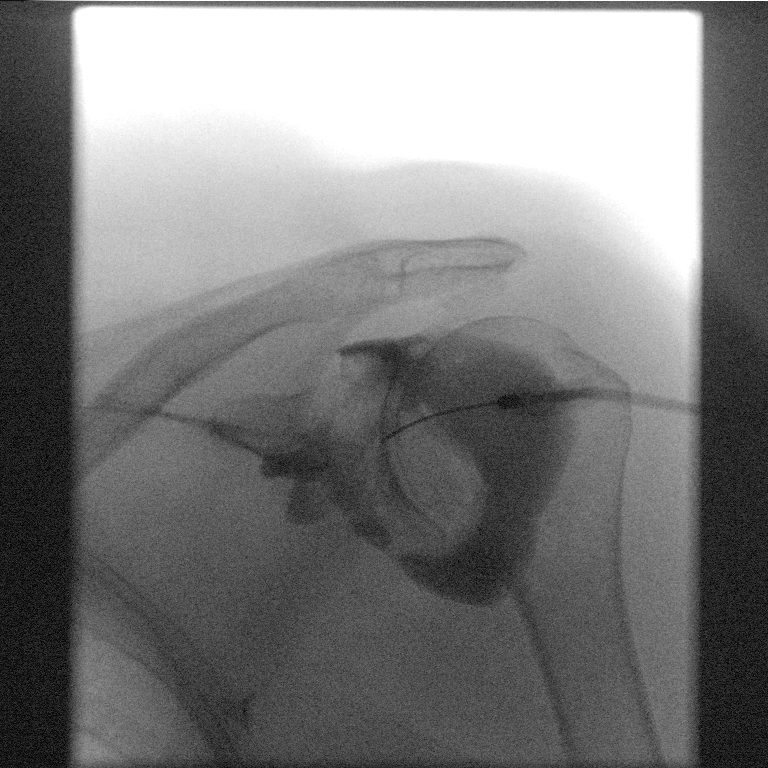
[frame 10/61]
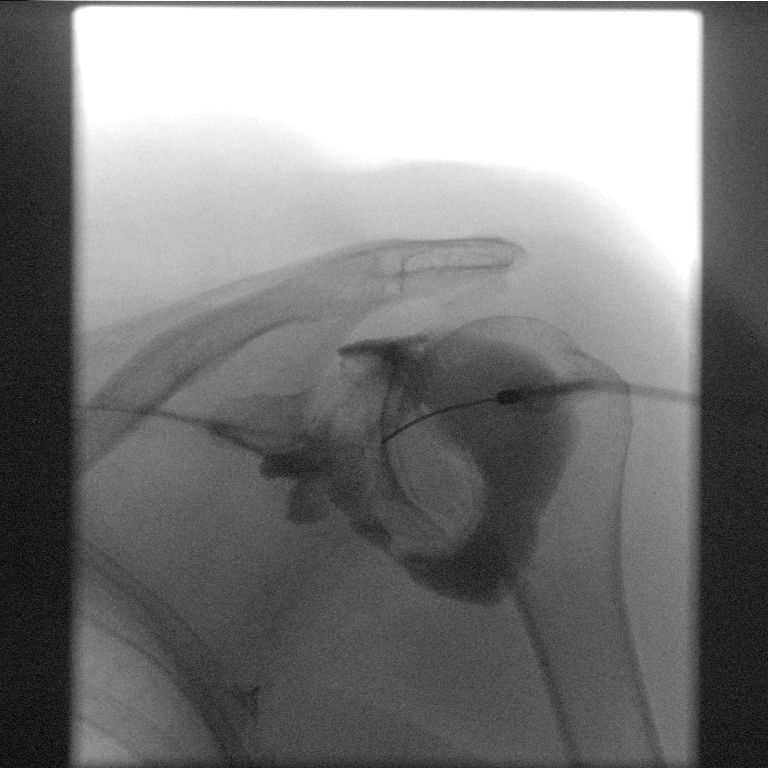
[frame 31/61]
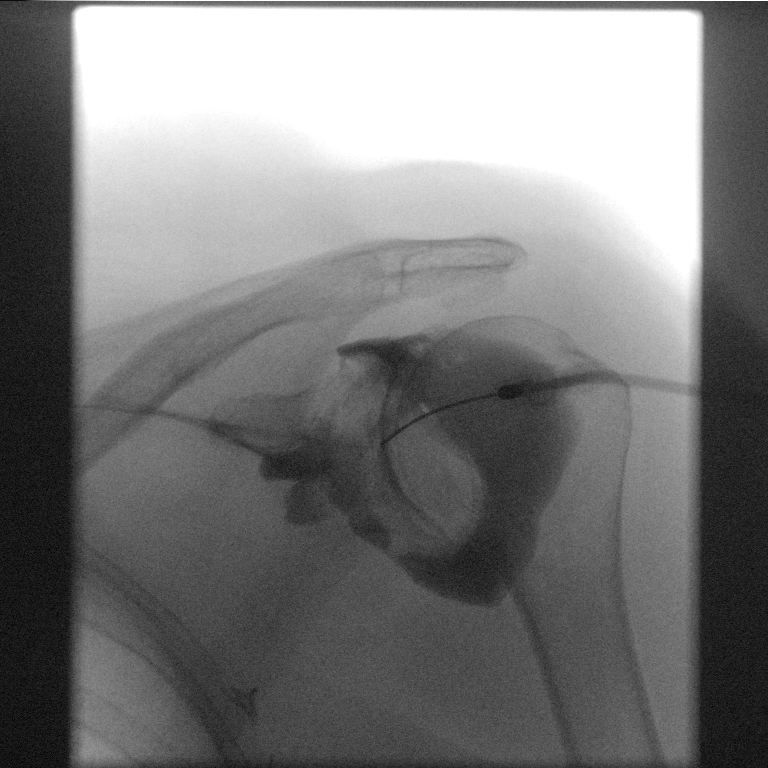
[frame 52/61]
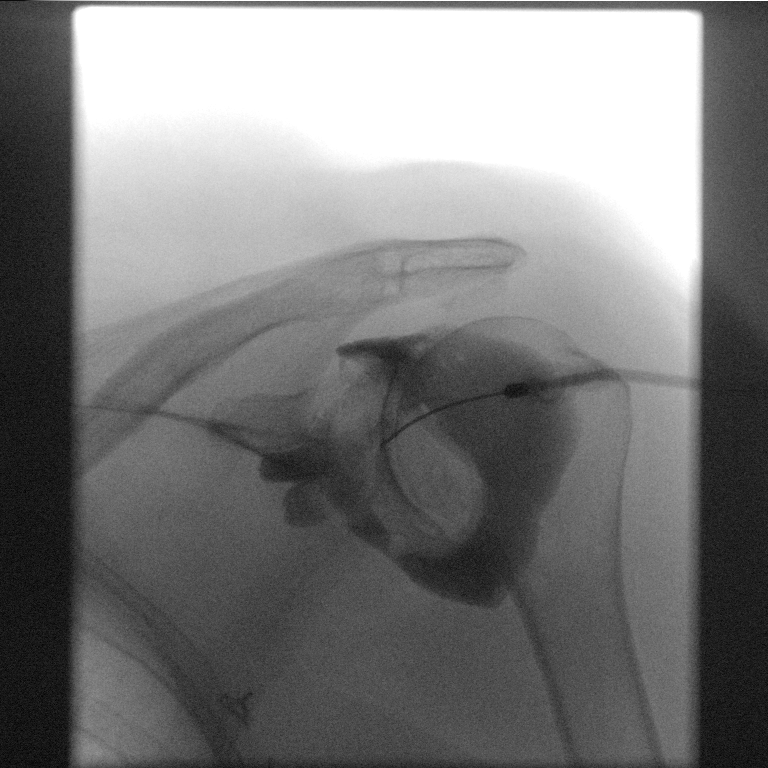

[4 of 4 positions shown; findings below may reference images not displayed]

FINDINGS: Successful left shoulder injection for MRI arthrogram. No
complications. Patient sent to MRI in stable condition.
IMPRESSION: Technically successful left shoulder injection for MRI.

## 2021-07-23 MED ORDER — BLOOD GLUCOSE MONITORING SUPPL KIT: 1.0000 | PACK | 0 refills | Status: DC | PRN

## 2021-07-25 MED ORDER — GLUCOSE BLOOD VI STRP
ORAL_STRIP | 12 refills | Status: DC
Start: 2021-07-25 — End: 2021-11-07

## 2021-08-04 DIAGNOSIS — M62830 Muscle spasm of back: Secondary | ICD-10-CM | POA: Diagnosis not present

## 2021-08-04 DIAGNOSIS — M5416 Radiculopathy, lumbar region: Secondary | ICD-10-CM | POA: Diagnosis not present

## 2021-08-04 DIAGNOSIS — M9903 Segmental and somatic dysfunction of lumbar region: Secondary | ICD-10-CM | POA: Diagnosis not present

## 2021-08-04 DIAGNOSIS — M9901 Segmental and somatic dysfunction of cervical region: Secondary | ICD-10-CM | POA: Diagnosis not present

## 2021-08-26 DIAGNOSIS — M9901 Segmental and somatic dysfunction of cervical region: Secondary | ICD-10-CM | POA: Diagnosis not present

## 2021-08-26 DIAGNOSIS — M5416 Radiculopathy, lumbar region: Secondary | ICD-10-CM | POA: Diagnosis not present

## 2021-08-26 DIAGNOSIS — M62830 Muscle spasm of back: Secondary | ICD-10-CM | POA: Diagnosis not present

## 2021-08-26 DIAGNOSIS — M9903 Segmental and somatic dysfunction of lumbar region: Secondary | ICD-10-CM | POA: Diagnosis not present

## 2021-09-23 DIAGNOSIS — M62830 Muscle spasm of back: Secondary | ICD-10-CM | POA: Diagnosis not present

## 2021-09-23 DIAGNOSIS — M9903 Segmental and somatic dysfunction of lumbar region: Secondary | ICD-10-CM | POA: Diagnosis not present

## 2021-09-23 DIAGNOSIS — M9901 Segmental and somatic dysfunction of cervical region: Secondary | ICD-10-CM | POA: Diagnosis not present

## 2021-09-23 DIAGNOSIS — M5416 Radiculopathy, lumbar region: Secondary | ICD-10-CM | POA: Diagnosis not present

## 2021-10-14 DIAGNOSIS — M9901 Segmental and somatic dysfunction of cervical region: Secondary | ICD-10-CM | POA: Diagnosis not present

## 2021-10-14 DIAGNOSIS — M62830 Muscle spasm of back: Secondary | ICD-10-CM | POA: Diagnosis not present

## 2021-10-14 DIAGNOSIS — M9903 Segmental and somatic dysfunction of lumbar region: Secondary | ICD-10-CM | POA: Diagnosis not present

## 2021-10-14 DIAGNOSIS — M5416 Radiculopathy, lumbar region: Secondary | ICD-10-CM | POA: Diagnosis not present

## 2021-10-22 ENCOUNTER — Other Ambulatory Visit: Payer: Self-pay | Admitting: Internal Medicine

## 2021-10-25 ENCOUNTER — Encounter: Payer: Self-pay | Admitting: Internal Medicine

## 2021-10-27 DIAGNOSIS — M1612 Unilateral primary osteoarthritis, left hip: Secondary | ICD-10-CM | POA: Diagnosis not present

## 2021-10-28 NOTE — Telephone Encounter (Signed)
Spoke with the pharmacist and the patient has already picked up rx.

## 2021-10-29 ENCOUNTER — Other Ambulatory Visit: Payer: Self-pay | Admitting: Orthopaedic Surgery

## 2021-11-04 ENCOUNTER — Encounter: Payer: Self-pay | Admitting: Internal Medicine

## 2021-11-04 DIAGNOSIS — M9901 Segmental and somatic dysfunction of cervical region: Secondary | ICD-10-CM | POA: Diagnosis not present

## 2021-11-04 DIAGNOSIS — M62830 Muscle spasm of back: Secondary | ICD-10-CM | POA: Diagnosis not present

## 2021-11-04 DIAGNOSIS — M5416 Radiculopathy, lumbar region: Secondary | ICD-10-CM | POA: Diagnosis not present

## 2021-11-04 DIAGNOSIS — M9903 Segmental and somatic dysfunction of lumbar region: Secondary | ICD-10-CM | POA: Diagnosis not present

## 2021-11-04 MED ORDER — BLOOD GLUCOSE METER KIT: PACK | 0 refills | Status: DC

## 2021-11-05 DIAGNOSIS — L720 Epidermal cyst: Secondary | ICD-10-CM | POA: Diagnosis not present

## 2021-11-05 DIAGNOSIS — L570 Actinic keratosis: Secondary | ICD-10-CM | POA: Diagnosis not present

## 2021-11-05 DIAGNOSIS — Z85828 Personal history of other malignant neoplasm of skin: Secondary | ICD-10-CM | POA: Diagnosis not present

## 2021-11-05 DIAGNOSIS — L718 Other rosacea: Secondary | ICD-10-CM | POA: Diagnosis not present

## 2021-11-05 DIAGNOSIS — B351 Tinea unguium: Secondary | ICD-10-CM | POA: Diagnosis not present

## 2021-11-05 DIAGNOSIS — L814 Other melanin hyperpigmentation: Secondary | ICD-10-CM | POA: Diagnosis not present

## 2021-11-06 ENCOUNTER — Other Ambulatory Visit: Payer: Self-pay

## 2021-11-06 ENCOUNTER — Other Ambulatory Visit (INDEPENDENT_AMBULATORY_CARE_PROVIDER_SITE_OTHER): Payer: PPO

## 2021-11-06 DIAGNOSIS — E039 Hypothyroidism, unspecified: Secondary | ICD-10-CM

## 2021-11-06 DIAGNOSIS — E785 Hyperlipidemia, unspecified: Secondary | ICD-10-CM | POA: Diagnosis not present

## 2021-11-06 DIAGNOSIS — E1169 Type 2 diabetes mellitus with other specified complication: Secondary | ICD-10-CM | POA: Diagnosis not present

## 2021-11-06 DIAGNOSIS — E559 Vitamin D deficiency, unspecified: Secondary | ICD-10-CM

## 2021-11-06 LAB — COMPREHENSIVE METABOLIC PANEL WITH GFR
ALT: 27 U/L (ref 0–35)
AST: 21 U/L (ref 0–37)
Albumin: 4.3 g/dL (ref 3.5–5.2)
Alkaline Phosphatase: 62 U/L (ref 39–117)
BUN: 16 mg/dL (ref 6–23)
CO2: 25 meq/L (ref 19–32)
Calcium: 9.6 mg/dL (ref 8.4–10.5)
Chloride: 105 meq/L (ref 96–112)
Creatinine, Ser: 0.98 mg/dL (ref 0.40–1.20)
GFR: 54.77 mL/min — ABNORMAL LOW (ref >60.00)
Glucose, Bld: 153 mg/dL — ABNORMAL HIGH (ref 70–99)
Potassium: 4.3 meq/L (ref 3.5–5.1)
Sodium: 139 meq/L (ref 135–145)
Total Bilirubin: 1.2 mg/dL (ref 0.2–1.2)
Total Protein: 7 g/dL (ref 6.0–8.3)

## 2021-11-06 LAB — LIPID PANEL
Cholesterol: 214 mg/dL — ABNORMAL HIGH (ref 0–200)
HDL: 81.5 mg/dL (ref >39.00)
LDL Cholesterol: 116 mg/dL — ABNORMAL HIGH (ref 0–99)
NonHDL: 132.42
Total CHOL/HDL Ratio: 3
Triglycerides: 83 mg/dL (ref 0.0–149.0)
VLDL: 16.6 mg/dL (ref 0.0–40.0)

## 2021-11-06 LAB — VITAMIN D 25 HYDROXY (VIT D DEFICIENCY, FRACTURES): VITD: 39.51 ng/mL (ref 30.00–100.00)

## 2021-11-06 LAB — MICROALBUMIN / CREATININE URINE RATIO
Creatinine,U: 68.3 mg/dL
Microalb Creat Ratio: 1 mg/g (ref 0.0–30.0)
Microalb, Ur: <0.7 mg/dL (ref 0.0–1.9)

## 2021-11-06 LAB — TSH: TSH: 0.92 u[IU]/mL (ref 0.35–5.50)

## 2021-11-06 LAB — HEMOGLOBIN A1C: Hgb A1c MFr Bld: 6.8 % — ABNORMAL HIGH (ref 4.6–6.5)

## 2021-11-10 ENCOUNTER — Encounter: Payer: Self-pay | Admitting: Internal Medicine

## 2021-11-10 ENCOUNTER — Ambulatory Visit (INDEPENDENT_AMBULATORY_CARE_PROVIDER_SITE_OTHER): Payer: PPO

## 2021-11-10 ENCOUNTER — Ambulatory Visit (INDEPENDENT_AMBULATORY_CARE_PROVIDER_SITE_OTHER): Payer: PPO | Admitting: Internal Medicine

## 2021-11-10 ENCOUNTER — Other Ambulatory Visit: Payer: Self-pay

## 2021-11-10 VITALS — BP 122/78 | HR 91 | Temp 98.5°F | Ht 63.5 in | Wt 175.6 lb

## 2021-11-10 DIAGNOSIS — N183 Chronic kidney disease, stage 3 unspecified: Secondary | ICD-10-CM | POA: Diagnosis not present

## 2021-11-10 DIAGNOSIS — R059 Cough, unspecified: Secondary | ICD-10-CM

## 2021-11-10 DIAGNOSIS — Z79899 Other long term (current) drug therapy: Secondary | ICD-10-CM

## 2021-11-10 DIAGNOSIS — B351 Tinea unguium: Secondary | ICD-10-CM | POA: Diagnosis not present

## 2021-11-10 DIAGNOSIS — L719 Rosacea, unspecified: Secondary | ICD-10-CM | POA: Insufficient documentation

## 2021-11-10 DIAGNOSIS — E785 Hyperlipidemia, unspecified: Secondary | ICD-10-CM | POA: Diagnosis not present

## 2021-11-10 DIAGNOSIS — M25552 Pain in left hip: Secondary | ICD-10-CM | POA: Diagnosis not present

## 2021-11-10 DIAGNOSIS — Z01818 Encounter for other preprocedural examination: Secondary | ICD-10-CM | POA: Diagnosis not present

## 2021-11-10 DIAGNOSIS — E1122 Type 2 diabetes mellitus with diabetic chronic kidney disease: Secondary | ICD-10-CM | POA: Diagnosis not present

## 2021-11-10 DIAGNOSIS — M791 Myalgia, unspecified site: Secondary | ICD-10-CM | POA: Diagnosis not present

## 2021-11-10 DIAGNOSIS — T466X5A Adverse effect of antihyperlipidemic and antiarteriosclerotic drugs, initial encounter: Secondary | ICD-10-CM | POA: Diagnosis not present

## 2021-11-10 DIAGNOSIS — E1169 Type 2 diabetes mellitus with other specified complication: Secondary | ICD-10-CM

## 2021-11-10 DIAGNOSIS — G8929 Other chronic pain: Secondary | ICD-10-CM

## 2021-11-10 NOTE — Assessment & Plan Note (Signed)
She has a history of myalgias resolved with suspension of statin

## 2021-11-10 NOTE — Assessment & Plan Note (Signed)
Intolerant of terbinafine.  Agree with trial of weekly fluconazole .  Will check LFts every 6 weeks

## 2021-11-10 NOTE — Patient Instructions (Addendum)
You can use 4000 mg tylenol for one week after the surgery,  but should reduce your daily dose to 2000 mg daily LONG TERM  If you need stronger pain medications:   Hydrocodone 10/325  and gabapentin for the nerve pain may be a good combination  for your post operative state   The gabapentin dose can be increased from 100 mg to 300 mg every 8 hours.  (You had 100 mg dose previously)  Do not let yourself get constipated!  If you take a narcotic, use dulcolax at night

## 2021-11-10 NOTE — Progress Notes (Signed)
Subjective:  Patient ID: Jacqueline Mata, female    DOB: 07-Jan-1941  Age: 81 y.o. MRN: [id number]  CC: The primary encounter diagnosis was Preoperative clearance. Diagnoses of Long-term use of high-risk medication, Cough in adult patient, Preoperative evaluation to rule out surgical contraindication, Onychomycosis of great toe, CKD stage 3 secondary to diabetes ([place]), Rosacea, Hyperlipidemia associated with type 2 diabetes mellitus ([place]), Myalgia due to statin, and Chronic hip pain, left were also pertinent to this visit.   This visit occurred during the SARS-CoV-2 public health emergency.  Safety protocols were in place, including screening questions prior to the visit, additional usage of staff PPE, and extensive cleaning of exam room while observing appropriate contact time as indicated for disinfecting solutions.    HPI Jacqueline Mata presents for  Chief Complaint  Patient presents with   Follow-up    Yearly follow up    Pre-op Exam    Surgical clearance for knee replacement   Preoperative medical clearance, requested by her orthopedist, for left anterior hip arthroplasty  to be done under  spinal anesthesia ;  scheduled for Feb 28 by Dr Novella Olive.   She has  NO HISTORY  of CAD and denies any recent episodes of chest pain . She has had a mild productive cough with PND  for the past several weeks without fevers or myalgias, which she attributes to the blooming.  the drainage has been clear.  Occasional sneezing Taking claritin for years.  She has DM Type 2 and hyperlipidemia which have been historically well controlled with diet  and not complicated by nephropathy , neuropathy , or PAD.  She  has mild CKD stage 3 a ,  stable and has had follow up labs, which were reviewed today .  SHE has several friends who will be taking turns staying with her.    Rosacea:  had laser treatments in the past  now going to use topical ivermectin  Onychomycosis: did not tolerate terbinafine. DrIsenstein   to prescribe one weekly  fluconazole planned for one year,  CKD:  Using meloxicam  every 4 days wants to move it to every 3 days for mgmt of neck pain .  Marland Kitchen Reviewed GFR      Outpatient Medications Prior to Visit  Medication Sig Dispense Refill   blood glucose meter kit and supplies Dispense based on patient and insurance preference. Use to check blood sugars one time daily. (ICD-10 E11.9). 1 each 0   Cholecalciferol (VITAMIN D) 50 MCG (2000 UT) tablet Take 2,000 Units by mouth daily.     COLLAGEN PO Take 1 Scoop by mouth every other day. With Probiotic     folic acid (FOLVITE) 496 MCG tablet Take 800 mcg by mouth daily.     levothyroxine (SYNTHROID) 88 MCG tablet TAKE 1 TABLET BY MOUTH DAILY BEFORE BREAKFAST 90 tablet 0   loratadine (CLARITIN) 10 MG tablet Take 10 mg by mouth daily.     meloxicam (MOBIC) 15 MG tablet Take 15 mg by mouth daily as needed for pain (takes about every 4 days).     OVER THE COUNTER MEDICATION Place 2 sprays into the nose daily. Sovereign Silver Nasal Spray (Immune Support)     Propylene Glycol (SYSTANE BALANCE) 0.6 % SOLN Place 1 drop into both eyes daily as needed (dry eyes).     vitamin B-12 (CYANOCOBALAMIN) 1000 MCG tablet Take 1,000 mcg by mouth daily.     amoxicillin (AMOXIL) 500 MG capsule Take 2,000 mg by  mouth See admin instructions. Dental procedures. Takes 2046m one hour prior to dental procedures. (Patient not taking: Reported on 11/10/2021)     fluconazole (DIFLUCAN) 200 MG tablet Take 200 mg by mouth once a week. (Patient not taking: Reported on 11/10/2021)     Ivermectin 1 % CREA Apply 1 application topically every evening. (Patient not taking: Reported on 11/10/2021)     No facility-administered medications prior to visit.    Review of Systems;  Patient denies headache, fevers, malaise, unintentional weight loss, skin rash, eye pain, sinus congestion and sinus pain, sore throat, dysphagia,  hemoptysis , cough, dyspnea, wheezing, chest pain,  palpitations, orthopnea, edema, abdominal pain, nausea, melena, diarrhea, constipation, flank pain, dysuria, hematuria, urinary  Frequency, nocturia, numbness, tingling, seizures,  Focal weakness, Loss of consciousness,  Tremor, insomnia, depression, anxiety, and suicidal ideation.      Objective:  BP 122/78 (BP Location: Left Arm, Patient Position: Sitting, Cuff Size: Normal)    Pulse 91    Temp 98.5 F (36.9 C) (Oral)    Ht 5' 3.5" (1.613 m)    Wt 175 lb 9.6 oz (79.7 kg)    SpO2 97%    BMI 30.62 kg/m   BP Readings from Last 3 Encounters:  11/10/21 122/78  07/10/21 128/64  07/01/21 135/79    Wt Readings from Last 3 Encounters:  11/10/21 175 lb 9.6 oz (79.7 kg)  07/11/21 176 lb (79.8 kg)  07/10/21 176 lb 6.4 oz (80 kg)    General appearance: alert, cooperative and appears stated age Ears: normal TM's and external ear canals both ears Throat: lips, mucosa, and tongue normal; teeth and gums normal Neck: no adenopathy, no carotid bruit, supple, symmetrical, trachea midline and thyroid not enlarged, symmetric, no tenderness/mass/nodules Back: symmetric, no curvature. ROM normal. No CVA tenderness. Lungs: clear to auscultation bilaterally Heart: regular rate and rhythm, S1, S2 normal, no murmur, click, rub or gallop Abdomen: soft, non-tender; bowel sounds normal; no masses,  no organomegaly Pulses: 2+ and symmetric Skin: Skin color, texture, turgor normal. No rashes or lesions Lymph nodes: Cervical, supraclavicular, and axillary nodes normal.  Lab Results  Component Value Date   HGBA1C 6.8 (H) 11/06/2021   HGBA1C 6.8 (H) 05/02/2021   HGBA1C 6.5 10/31/2020    Lab Results  Component Value Date   CREATININE 0.98 11/06/2021   CREATININE 1.00 07/01/2021   CREATININE 0.92 05/02/2021    Lab Results  Component Value Date   WBC 9.0 07/01/2021   HGB 14.7 07/01/2021   HCT 43.0 07/01/2021   PLT 179 07/01/2021   GLUCOSE 153 (H) 11/06/2021   CHOL 214 (H) 11/06/2021   TRIG 83.0  11/06/2021   HDL 81.50 11/06/2021   LDLDIRECT 122.0 07/01/2017   LDLCALC 116 (H) 11/06/2021   ALT 27 11/06/2021   AST 21 11/06/2021   NA 139 11/06/2021   K 4.3 11/06/2021   CL 105 11/06/2021   CREATININE 0.98 11/06/2021   BUN 16 11/06/2021   CO2 25 11/06/2021   TSH 0.92 11/06/2021   INR 1.1 11/05/2020   HGBA1C 6.8 (H) 11/06/2021   MICROALBUR <0.7 11/06/2021    MM 3D SCREEN BREAST BILATERAL  Result Date: 06/01/2021 CLINICAL DATA:  Screening. EXAM: DIGITAL SCREENING BILATERAL MAMMOGRAM WITH TOMOSYNTHESIS AND CAD TECHNIQUE: Bilateral screening digital craniocaudal and mediolateral oblique mammograms were obtained. Bilateral screening digital breast tomosynthesis was performed. The images were evaluated with computer-aided detection. COMPARISON:  Previous exam(s). ACR Breast Density Category b: There are scattered areas of fibroglandular density. FINDINGS:  There are no findings suspicious for malignancy. IMPRESSION: No mammographic evidence of malignancy. A result letter of this screening mammogram will be mailed directly to the patient. RECOMMENDATION: Screening mammogram in one year. (Code:[id number]-B-01Y) BI-RADS CATEGORY  1: Negative. Electronically Signed   By: Abelardo Diesel M.D.   On: 06/01/2021 10:10   Assessment & Plan:   Problem List Items Addressed This Visit     Chronic hip pain, left    For elective arthroplasty under spinal anesthesia on Nov 25 2021 by Adele Schilder [facility]      CKD stage 3 secondary to diabetes ([place])    GFR remains < 60 ml/min but > 45  on recent CMP.  She was referred to nephrology but cancelled Sept initial consult and saw a [facility] nephrologist Dr Radene Knee instead.  Told she could use NSAIDs infrequently for neck pain .  Currently using them every 4 days,  Ok to increase to every 3 days    Lab Results  Component Value Date   CREATININE 0.98 11/06/2021   Lab Results  Component Value Date   MICROALBUR <0.7 11/06/2021   MICROALBUR <0.7 05/06/2021           Hyperlipidemia associated with type 2 diabetes mellitus ([place])    Improved with dietary  Changes.  She is statin intolerant. a1c is  7.0  No changes today  Lab Results  Component Value Date   HGBA1C 6.8 (H) 11/06/2021   Lab Results  Component Value Date   CHOL 214 (H) 11/06/2021   HDL 81.50 11/06/2021   LDLCALC 116 (H) 11/06/2021   LDLDIRECT 122.0 07/01/2017   TRIG 83.0 11/06/2021   CHOLHDL 3 11/06/2021   Lab Results  Component Value Date   MICROALBUR <0.7 11/06/2021   MICROALBUR <0.7 05/06/2021           Myalgia due to statin    She has a history of myalgias resolved with suspension of statin        Onychomycosis of great toe    Intolerant of terbinafine.  Agree with trial of weekly fluconazole .  Will check LFts every 6 weeks       Preoperative evaluation to rule out surgical contraindication    I have ordered and reviewed a 12 lead EKG and find that there are no acute changes and patient is in sinus rhythm.  Labs done on Feb 9 note stable GFR and a1c < 7.0  Barring any unforeseen changes on chest x ray ,  She has been cleared.       Rosacea    To be managed with topical ivermectin prescribed by Dr Kellie Moor       Other Visit Diagnoses     Preoperative clearance    -  Primary   Relevant Orders   EKG 12-Lead (Completed)   DG Chest 2 View   Long-term use of high-risk medication       Relevant Orders   Comprehensive metabolic panel   CBC with Differential/Platelet   Cough in adult patient       Relevant Orders   DG Chest 2 View       I spent 30 minutes dedicated to the care of this patient on the date of this encounter to include pre-visit review of patient's medical history,  most recent imaging studies, Face-to-face time with the patient , and post visit ordering of testing and therapeutics.    Follow-up: No follow-ups on file.   [name], MD

## 2021-11-10 NOTE — Assessment & Plan Note (Signed)
Improved with dietary  Changes.  She is statin intolerant. a1c is  7.0  No changes today  Lab Results  Component Value Date   HGBA1C 6.8 (H) 11/06/2021   Lab Results  Component Value Date   CHOL 214 (H) 11/06/2021   HDL 81.50 11/06/2021   LDLCALC 116 (H) 11/06/2021   LDLDIRECT 122.0 07/01/2017   TRIG 83.0 11/06/2021   CHOLHDL 3 11/06/2021   Lab Results  Component Value Date   MICROALBUR <0.7 11/06/2021   MICROALBUR <0.7 05/06/2021

## 2021-11-10 NOTE — Assessment & Plan Note (Signed)
GFR remains < 60 ml/min but > 45  on recent CMP.  She was referred to nephrology but cancelled Sept initial consult and saw a [facility] nephrologist Dr Radene Knee instead.  Told she could use NSAIDs infrequently for neck pain .  Currently using them every 4 days,  Ok to increase to every 3 days    Lab Results  Component Value Date   CREATININE 0.98 11/06/2021   Lab Results  Component Value Date   MICROALBUR <0.7 11/06/2021   MICROALBUR <0.7 05/06/2021

## 2021-11-10 NOTE — Assessment & Plan Note (Signed)
To be managed with topical ivermectin prescribed by Dr Kellie Moor

## 2021-11-10 NOTE — Assessment & Plan Note (Signed)
For elective arthroplasty under spinal anesthesia on Nov 25 2021 by Adele Schilder [facility]

## 2021-11-10 NOTE — Assessment & Plan Note (Signed)
I have ordered and reviewed a 12 lead EKG and find that there are no acute changes and patient is in sinus rhythm.  Labs done on Feb 9 note stable GFR and a1c < 7.0  Barring any unforeseen changes on chest x ray ,  She has been cleared.

## 2021-11-11 NOTE — Progress Notes (Addendum)
Anesthesia Review:  PCP: DR Charlesetta Shanks LOV 11/10/21  for preop clearance  Cardiologist : none  Chest x-ray : 2v- 11/10/21  EKG : 11/10/21  Echo : Stress test: Cardiac Cath :  Activity level: can do a flight of stairs without difficulty  Sleep Study/ CPAP : mild per pt no cpap  Fasting Blood Sugar :      / Checks Blood Sugar -- times a day:   Blood Thinner/ Instructions /Last Dose: ASA / Instructions/ Last Dose : 11/06/21- hgba1c- 6.8  Prediabetes on no meds  Checks glucose daily at home  CMP on 11/06/21    No covid test- ambulatory surgery

## 2021-11-11 NOTE — Progress Notes (Signed)
Your procedure is scheduled on:   11/25/21   Report to [facility] Main  Entrance   Report to admitting at   0730AM     Call this number if you have problems the morning of surgery [phone]    REMEMBER: NO  SOLID FOOD CANDY OR GUM AFTER MIDNIGHT. CLEAR LIQUIDS UNTIL    0710am       . NOTHING BY MOUTH EXCEPT CLEAR LIQUIDS UNTIL    0710am . PLEASE FINISH ENSURE DRINK PER SURGEON ORDER  WHICH NEEDS TO BE COMPLETED AT    0710am   .      CLEAR LIQUID DIET   Foods Allowed                                                                    Coffee and tea, regular and decaf                            Fruit ices (not with fruit pulp)                                      Iced Popsicles                                    Carbonated beverages, regular and diet                                    Cranberry, grape and apple juices Sports drinks like Gatorade Lightly seasoned clear broth or consume(fat free) Sugar, honey syrup ___________________________________________________________________      BRUSH YOUR TEETH MORNING OF SURGERY AND RINSE YOUR MOUTH OUT, NO CHEWING GUM CANDY OR MINTS.     Take these medicines the morning of surgery with A SIP OF WATER:  synthroid, claritin   DO NOT TAKE ANY DIABETIC MEDICATIONS DAY OF YOUR SURGERY                               You may not have any metal on your body including hair pins and              piercings  Do not wear jewelry, make-up, lotions, powders or perfumes, deodorant             Do not wear nail polish on your fingernails.  Do not shave  48 hours prior to surgery.              Men may shave face and neck.   Do not bring valuables to the hospital. [place].  Contacts, dentures or bridgework may not be worn into surgery.  Leave suitcase in the car. After surgery it may be brought to your room.     Patients discharged the day of surgery will not be allowed to  drive home. IF YOU ARE HAVING SURGERY AND GOING HOME THE SAME DAY, YOU MUST HAVE AN ADULT TO DRIVE YOU HOME AND BE WITH YOU FOR 24 HOURS. YOU MAY GO HOME BY TAXI OR UBER OR ORTHERWISE, BUT AN ADULT MUST ACCOMPANY YOU HOME AND STAY WITH YOU FOR 24 HOURS.  Name and phone number of your driver:  Special Instructions: N/A              Please read over the following fact sheets you were given: _____________________________________________________________________  [facility] - Preparing for Surgery Before surgery, you can play an important role.  Because skin is not sterile, your skin needs to be as free of germs as possible.  You can reduce the number of germs on your skin by washing with CHG (chlorahexidine gluconate) soap before surgery.  CHG is an antiseptic cleaner which kills germs and bonds with the skin to continue killing germs even after washing. Please DO NOT use if you have an allergy to CHG or antibacterial soaps.  If your skin becomes reddened/irritated stop using the CHG and inform your nurse when you arrive at Short Stay. Do not shave (including legs and underarms) for at least 48 hours prior to the first CHG shower.  You may shave your face/neck. Please follow these instructions carefully:  1.  Shower with CHG Soap the night before surgery and the  morning of Surgery.  2.  If you choose to wash your hair, wash your hair first as usual with your  normal  shampoo.  3.  After you shampoo, rinse your hair and body thoroughly to remove the  shampoo.                           4.  Use CHG as you would any other liquid soap.  You can apply chg directly  to the skin and wash                       Gently with a scrungie or clean washcloth.  5.  Apply the CHG Soap to your body ONLY FROM THE NECK DOWN.   Do not use on face/ open                           Wound or open sores. Avoid contact with eyes, ears mouth and genitals (private parts).                       Wash face,  Genitals (private parts)  with your normal soap.             6.  Wash thoroughly, paying special attention to the area where your surgery  will be performed.  7.  Thoroughly rinse your body with warm water from the neck down.  8.  DO NOT shower/wash with your normal soap after using and rinsing off  the CHG Soap.                9.  Pat yourself dry with a clean towel.            10.  Wear clean pajamas.            11.  Place clean sheets on your bed the night of your first shower and do not  sleep with pets. Day of Surgery : Do not apply any lotions/deodorants the morning  of surgery.  Please wear clean clothes to the hospital/surgery center.  FAILURE TO FOLLOW THESE INSTRUCTIONS MAY RESULT IN THE CANCELLATION OF YOUR SURGERY PATIENT SIGNATURE_________________________________  NURSE SIGNATURE__________________________________  ________________________________________________________________________

## 2021-11-12 ENCOUNTER — Other Ambulatory Visit (HOSPITAL_COMMUNITY): Payer: PPO

## 2021-11-12 NOTE — Care Plan (Signed)
Ortho Bundle Case Management Note  Patient Details  Name: Jacqueline Mata MRN: [id number] Date of Birth: [birth year]    Spoke with patient prior to surgery. She will discharge to home with family. Has RW at home. HHPT referral to [facility] and OPPT set up with [facility]. Patient and MD in agreement with plan. Choice offered                 DME Arranged:    DME Agency:     HH Arranged:  PT HH Agency:  [facility]  Additional Comments: Please contact me with any questions of if this plan should need to change.  Ladell Heads,  [place] [facility]  [phone] 11/12/2021, 12:53 PM

## 2021-11-13 ENCOUNTER — Encounter (HOSPITAL_COMMUNITY)
Admission: RE | Admit: 2021-11-13 | Discharge: 2021-11-13 | Disposition: A | Discharge status: Home / Self Care | Payer: PPO | Source: Ambulatory Visit | Attending: Orthopaedic Surgery | Admitting: Orthopaedic Surgery

## 2021-11-13 ENCOUNTER — Encounter (HOSPITAL_COMMUNITY): Payer: Self-pay

## 2021-11-13 ENCOUNTER — Other Ambulatory Visit: Payer: Self-pay

## 2021-11-13 VITALS — BP 134/87 | HR 99 | Temp 98.2°F | Resp 16 | Ht 65.0 in | Wt 169.0 lb

## 2021-11-13 DIAGNOSIS — Z01812 Encounter for preprocedural laboratory examination: Secondary | ICD-10-CM | POA: Diagnosis not present

## 2021-11-13 DIAGNOSIS — Z79899 Other long term (current) drug therapy: Secondary | ICD-10-CM | POA: Diagnosis not present

## 2021-11-13 DIAGNOSIS — Z01818 Encounter for other preprocedural examination: Secondary | ICD-10-CM

## 2021-11-13 HISTORY — DX: Sleep apnea, unspecified: G47.30

## 2021-11-13 HISTORY — DX: Tinnitus, unspecified ear: H93.19

## 2021-11-13 LAB — TYPE AND SCREEN
ABO/RH(D): O POS
Antibody Screen: NEGATIVE

## 2021-11-13 LAB — CBC
HCT: 44.6 % (ref 36.0–46.0)
Hemoglobin: 15 g/dL (ref 12.0–15.0)
MCH: 30.5 pg (ref 26.0–34.0)
MCHC: 33.6 g/dL (ref 30.0–36.0)
MCV: 90.7 fL (ref 80.0–100.0)
Platelets: 157 10*3/uL (ref 150–400)
RBC: 4.92 MIL/uL (ref 3.87–5.11)
RDW: 12.7 % (ref 11.5–15.5)
WBC: 9.4 10*3/uL (ref 4.0–10.5)
nRBC: 0 % (ref 0.0–0.2)

## 2021-11-13 LAB — GLUCOSE, CAPILLARY: Glucose-Capillary: 131 mg/dL — ABNORMAL HIGH (ref 70–99)

## 2021-11-13 LAB — SURGICAL PCR SCREEN
MRSA, PCR: NEGATIVE
Staphylococcus aureus: NEGATIVE

## 2021-11-19 DIAGNOSIS — X32XXXA Exposure to sunlight, initial encounter: Secondary | ICD-10-CM | POA: Diagnosis not present

## 2021-11-19 DIAGNOSIS — L570 Actinic keratosis: Secondary | ICD-10-CM | POA: Diagnosis not present

## 2021-11-24 DIAGNOSIS — M62830 Muscle spasm of back: Secondary | ICD-10-CM | POA: Diagnosis not present

## 2021-11-24 DIAGNOSIS — M5416 Radiculopathy, lumbar region: Secondary | ICD-10-CM | POA: Diagnosis not present

## 2021-11-24 DIAGNOSIS — M9901 Segmental and somatic dysfunction of cervical region: Secondary | ICD-10-CM | POA: Diagnosis not present

## 2021-11-24 DIAGNOSIS — M9903 Segmental and somatic dysfunction of lumbar region: Secondary | ICD-10-CM | POA: Diagnosis not present

## 2021-11-24 NOTE — Anesthesia Preprocedure Evaluation (Addendum)
Anesthesia Evaluation  Patient identified by MRN, date of birth, ID band Patient awake    Reviewed: Allergy & Precautions, NPO status , Patient's Chart, lab work & pertinent test results  History of Anesthesia Complications (+) PONV and history of anesthetic complications  Airway Mallampati: II  TM Distance: >3 FB Neck ROM: Full    Dental  (+) Dental Advisory Given, Partial Upper, Partial Lower   Pulmonary sleep apnea ("mild, no cpap") , former smoker,    Pulmonary exam normal        Cardiovascular negative cardio ROS Normal cardiovascular exam     Neuro/Psych  Tinnitus  negative psych ROS   GI/Hepatic negative GI ROS, Neg liver ROS,   Endo/Other  Hypothyroidism  Pre-DM   Renal/GU negative Renal ROS     Musculoskeletal  (+) Arthritis ,   Abdominal   Peds  Hematology negative hematology ROS (+)   Anesthesia Other Findings   Reproductive/Obstetrics  Breast cancer                             Anesthesia Physical Anesthesia Plan  ASA: 2  Anesthesia Plan: Spinal   Post-op Pain Management: Tylenol PO (pre-op)*   Induction:   PONV Risk Score and Plan: 3 and Treatment may vary due to age or medical condition, Propofol infusion and Ondansetron  Airway Management Planned: Natural Airway and Simple Face Mask  Additional Equipment: None  Intra-op Plan:   Post-operative Plan:   Informed Consent: I have reviewed the patients History and Physical, chart, labs and discussed the procedure including the risks, benefits and alternatives for the proposed anesthesia with the patient or authorized representative who has indicated his/her understanding and acceptance.       Plan Discussed with: CRNA and Anesthesiologist  Anesthesia Plan Comments: (Labs reviewed, platelets acceptable. Discussed risks and benefits of spinal, including spinal/epidural hematoma, infection, failed block, and  PDPH. Patient expressed understanding and wished to proceed. )       Anesthesia Quick Evaluation

## 2021-11-24 NOTE — H&P (Signed)
TOTAL HIP ADMISSION H&P  Patient is admitted for left total hip arthroplasty.  Subjective:  Chief Complaint: left hip pain  HPI: Jacqueline Mata, 81 y.o. female, has a history of pain and functional disability in the left hip(s) due to arthritis and patient has failed non-surgical conservative treatments for greater than 12 weeks to include NSAID's and/or analgesics, corticosteriod injections, flexibility and strengthening excercises, supervised PT with diminished ADL's post treatment, use of assistive devices, weight reduction as appropriate, and activity modification.  Onset of symptoms was gradual starting 5 years ago with gradually worsening course since that time.The patient noted no past surgery on the left hip(s).  Patient currently rates pain in the left hip at 10 out of 10 with activity. Patient has night pain, worsening of pain with activity and weight bearing, trendelenberg gait, pain that interfers with activities of daily living, and crepitus. Patient has evidence of subchondral cysts, subchondral sclerosis, periarticular osteophytes, and joint space narrowing by imaging studies. This condition presents safety issues increasing the risk of falls.  There is no current active infection.  Patient Active Problem List   Diagnosis Date Noted   Rosacea 11/10/2021   Chronic hip pain, left 11/10/2021   Chronic pain in left shoulder 05/06/2021   CKD stage 3 secondary to diabetes ([place]) 05/06/2021   Mixed stress and urge urinary incontinence 05/06/2021   Pain, dental 02/10/2021   Tear of left biceps muscle 01/08/2021   Preoperative evaluation to rule out surgical contraindication 11/04/2020   Primary osteoarthritis of right hip 07/20/2020   Pre-ulcerative corn or callous 07/09/2020   Elevated liver enzymes 04/16/2020   Short-term memory loss 07/31/2019   Educated about COVID-19 virus infection 01/30/2019   Osteopenia determined by x-ray 03/02/2018   Myalgia due to statin 01/02/2018    Stage 2 carcinoma of breast, ER+, unspecified laterality ([place]) 04/29/2017   Onychomycosis of great toe 03/19/2016   Cervical radiculopathy due to degenerative joint disease of spine 03/19/2015   Acromioclavicular joint arthritis 02/19/2015   Rotator cuff dysfunction 01/08/2015   Shoulder pain, bilateral 12/21/2014   Sciatica of left side 10/17/2014   Encounter for Medicare annual wellness exam 05/22/2014   Basal cell carcinoma of leg 12/20/2013   Overweight (BMI 25.0-29.9) 08/08/2013   Balance problem due to vestibular dysfunction 05/01/2012   Status post total knee replacement 05/01/2012   Breast cancer in situ 11/09/2011   Acquired hypothyroidism 07/26/2011   Hyperlipidemia associated with type 2 diabetes mellitus ([place]) 07/23/2011   Past Medical History:  Diagnosis Date   Arthritis    breast cancer 2005   bilateral   Cataract 01/04/13 and 11/02/40   Complication of anesthesia    dizziness    Hyperlipidemia    hypothyroidism    Hypothyroidism    Personal history of chemotherapy    Personal history of radiation therapy    PONV (postoperative nausea and vomiting)    Pre-diabetes    Sleep apnea    mild diagnosis no cpap   Tinnitus     Past Surgical History:  Procedure Laterality Date   bilateral knee replacement s     BREAST BIOPSY Left 2005   positive   BREAST BIOPSY Right 2005   positive   BREAST LUMPECTOMY Left 2005   BREAST LUMPECTOMY Right 2005   gumm surgery      JOINT REPLACEMENT  2013   by Dr. Marry Guan   righ tknee arthroscopy      TONSILLECTOMY     TOTAL HIP ARTHROPLASTY Right  11/12/2020   Procedure: RIGHT TOTAL HIP ARTHROPLASTY ANTERIOR APPROACH;  Surgeon: Marcene Corning, MD;  Location: [facility];  Service: Orthopedics;  Laterality: Right;    No current facility-administered medications for this encounter.   Current Outpatient Medications  Medication Sig Dispense Refill Last Dose   amoxicillin (AMOXIL) 500 MG capsule Take 2,000 mg by mouth See admin  instructions. Dental procedures. Takes 2000mg  one hour prior to dental procedures. (Patient not taking: Reported on 11/10/2021)      Cholecalciferol (VITAMIN D) 50 MCG (2000 UT) tablet Take 2,000 Units by mouth daily.      COLLAGEN PO Take 1 Scoop by mouth every other day. With Probiotic      folic acid (FOLVITE) 800 MCG tablet Take 800 mcg by mouth daily.      levothyroxine (SYNTHROID) 88 MCG tablet TAKE 1 TABLET BY MOUTH DAILY BEFORE BREAKFAST 90 tablet 0    loratadine (CLARITIN) 10 MG tablet Take 10 mg by mouth daily.      meloxicam (MOBIC) 15 MG tablet Take 15 mg by mouth daily as needed for pain (takes about every 4 days).      OVER THE COUNTER MEDICATION Place 2 sprays into the nose daily. Sovereign Silver Nasal Spray (Immune Support)      Propylene Glycol (SYSTANE BALANCE) 0.6 % SOLN Place 1 drop into both eyes daily as needed (dry eyes).      vitamin B-12 (CYANOCOBALAMIN) 1000 MCG tablet Take 1,000 mcg by mouth daily.      blood glucose meter kit and supplies Dispense based on patient and insurance preference. Use to check blood sugars one time daily. (ICD-10 E11.9). 1 each 0    fluconazole (DIFLUCAN) 200 MG tablet Take 200 mg by mouth once a week. (Patient not taking: Reported on 11/10/2021)      Ivermectin 1 % CREA Apply 1 application topically every evening. (Patient not taking: Reported on 11/10/2021)      Allergies  Allergen Reactions   Codeine Shortness Of Breath and Other (See Comments)    Altered mental staus   Ilevro [Nepafenac] Other (See Comments)    Eye swelling Eye drops    Lamisil [Terbinafine] Rash and Other (See Comments)    Dysphagia and skin fungus   Other Swelling    DOGS.  Congestion. FEATHERS.  Congestion  Can not take preservative free eye drops     Maxitrol [Neomycin-Polymyxin-Dexameth]     dry eyes    Metformin And Related     Shakiness    Augmentin [Amoxicillin-Pot Clavulanate] Itching and Rash   Molds & Smuts Other (See Comments)    Congestion.    Neomycin Rash   Neomycin-Bacitracin Zn-Polymyx Rash   Neomycin-Polymyxin-Hc Rash   Tape Dermatitis    Blisters   Tapentadol Nausea And Vomiting    Nucynta   Terbinafine Rash    Trouble swallowing   Tramadol Other (See Comments), Nausea Only and Nausea And Vomiting    dizziness    Social History   Tobacco Use   Smoking status: Former    Packs/day: 1.00    Years: 19.00    Pack years: 19.00    Types: Cigarettes    Start date: 01/02/1960    Quit date: 07/22/1978    Years since quitting: 43.3   Smokeless tobacco: Never   Tobacco comments:    quit 36 years ago  Substance Use Topics   Alcohol use: Yes    Alcohol/week: 0.0 standard drinks    Comment: rare  Family History  Problem Relation Age of Onset   Cancer Sister        lung, former tobacco   Cancer Brother 23       lung, thyroid, melanoma   Cancer Mother 64       ovarian   Diabetes Son    Breast cancer Maternal Grandfather      Review of Systems  Musculoskeletal:  Positive for arthralgias.       Left hip  All other systems reviewed and are negative.  Objective:  Physical Exam Constitutional:      Appearance: Normal appearance.  HENT:     Head: Normocephalic and atraumatic.     Nose: Nose normal.     Mouth/Throat:     Pharynx: Oropharynx is clear.  Eyes:     Extraocular Movements: Extraocular movements intact.  Cardiovascular:     Rate and Rhythm: Normal rate and regular rhythm.  Pulmonary:     Effort: Pulmonary effort is normal.  Abdominal:     Palpations: Abdomen is soft.  Musculoskeletal:     Cervical back: Normal range of motion.     Comments: Left hip motion is limited and painful with internal rotation.  Leg lengths are roughly equal.  She is walking with a mildly altered gait.  Sensation and motor function are intact in her feet with palpable pulses on both sides.    Skin:    General: Skin is warm and dry.  Neurological:     General: No focal deficit present.     Mental Status: She is alert  and oriented to person, place, and time. Mental status is at baseline.  Psychiatric:        Mood and Affect: Mood normal.        Behavior: Behavior normal.    Vital signs in last 24 hours:    Labs:   Estimated body mass index is 28.12 kg/m as calculated from the following:   Height as of 11/13/21: $RemoveBef'5\' 5"'oZMkMLAPmD$  (1.651 m).   Weight as of 11/13/21: 76.7 kg.   Imaging Review Plain radiographs demonstrate severe degenerative joint disease of the left hip(s). The bone quality appears to be good for age and reported activity level.    Assessment/Plan:  End stage primary arthritis, left hip(s)  The patient history, physical examination, clinical judgement of the provider and imaging studies are consistent with end stage degenerative joint disease of the left hip(s) and total hip arthroplasty is deemed medically necessary. The treatment options including medical management, injection therapy, arthroscopy and arthroplasty were discussed at length. The risks and benefits of total hip arthroplasty were presented and reviewed. The risks due to aseptic loosening, infection, stiffness, dislocation/subluxation,  thromboembolic complications and other imponderables were discussed.  The patient acknowledged the explanation, agreed to proceed with the plan and consent was signed. Patient is being admitted for inpatient treatment for surgery, pain control, PT, OT, prophylactic antibiotics, VTE prophylaxis, progressive ambulation and ADL's and discharge planning.The patient is planning to be discharged home with home health services

## 2021-11-25 ENCOUNTER — Ambulatory Visit (HOSPITAL_COMMUNITY): Payer: PPO

## 2021-11-25 ENCOUNTER — Ambulatory Visit (HOSPITAL_BASED_OUTPATIENT_CLINIC_OR_DEPARTMENT_OTHER): Payer: PPO | Admitting: Anesthesiology

## 2021-11-25 ENCOUNTER — Encounter (HOSPITAL_COMMUNITY): Payer: Self-pay | Admitting: Orthopaedic Surgery

## 2021-11-25 ENCOUNTER — Ambulatory Visit (HOSPITAL_COMMUNITY)
Admission: RE | Admit: 2021-11-25 | Discharge: 2021-11-25 | Disposition: A | Discharge status: Home / Self Care | Payer: PPO | Source: Ambulatory Visit | Attending: Orthopaedic Surgery | Admitting: Orthopaedic Surgery

## 2021-11-25 ENCOUNTER — Encounter (HOSPITAL_COMMUNITY)
Admission: RE | Disposition: A | Discharge status: Home / Self Care | Payer: Self-pay | Source: Ambulatory Visit | Attending: Orthopaedic Surgery

## 2021-11-25 ENCOUNTER — Ambulatory Visit (HOSPITAL_COMMUNITY): Payer: PPO | Admitting: Physician Assistant

## 2021-11-25 DIAGNOSIS — G4730 Sleep apnea, unspecified: Secondary | ICD-10-CM | POA: Insufficient documentation

## 2021-11-25 DIAGNOSIS — Z419 Encounter for procedure for purposes other than remedying health state, unspecified: Secondary | ICD-10-CM

## 2021-11-25 DIAGNOSIS — Z853 Personal history of malignant neoplasm of breast: Secondary | ICD-10-CM | POA: Diagnosis not present

## 2021-11-25 DIAGNOSIS — Z471 Aftercare following joint replacement surgery: Secondary | ICD-10-CM | POA: Diagnosis not present

## 2021-11-25 DIAGNOSIS — Z87891 Personal history of nicotine dependence: Secondary | ICD-10-CM | POA: Insufficient documentation

## 2021-11-25 DIAGNOSIS — M1612 Unilateral primary osteoarthritis, left hip: Secondary | ICD-10-CM | POA: Diagnosis not present

## 2021-11-25 DIAGNOSIS — Z01818 Encounter for other preprocedural examination: Secondary | ICD-10-CM

## 2021-11-25 DIAGNOSIS — Z96642 Presence of left artificial hip joint: Secondary | ICD-10-CM | POA: Diagnosis not present

## 2021-11-25 HISTORY — PX: TOTAL HIP ARTHROPLASTY: SHX124

## 2021-11-25 SURGERY — ARTHROPLASTY, HIP, TOTAL, ANTERIOR APPROACH
Anesthesia: Spinal | Site: Hip | Laterality: Left

## 2021-11-25 MED ORDER — EPHEDRINE SULFATE-NACL 50-0.9 MG/10ML-% IV SOSY
PREFILLED_SYRINGE | INTRAVENOUS | Status: DC | PRN
  Administered 2021-11-25: 5 mg via INTRAVENOUS
  Administered 2021-11-25: 10 mg via INTRAVENOUS

## 2021-11-25 MED ORDER — LACTATED RINGERS IV SOLN: INTRAVENOUS | Status: DC

## 2021-11-25 MED ORDER — FENTANYL CITRATE (PF) 100 MCG/2ML IJ SOLN
INTRAMUSCULAR | Status: DC | PRN
  Administered 2021-11-25 (×2): 50 ug via INTRAVENOUS

## 2021-11-25 MED ORDER — TRANEXAMIC ACID-NACL 1000-0.7 MG/100ML-% IV SOLN
1000.0000 mg | INTRAVENOUS | Status: AC
  Administered 2021-11-25: 1000 mg via INTRAVENOUS
  Filled 2021-11-25: qty 100

## 2021-11-25 MED ORDER — LACTATED RINGERS IV BOLUS
250.0000 mL | Freq: Once | INTRAVENOUS | Status: AC
  Administered 2021-11-25: 250 mL via INTRAVENOUS

## 2021-11-25 MED ORDER — LIDOCAINE 2% (20 MG/ML) 5 ML SYRINGE
INTRAMUSCULAR | Status: DC | PRN
  Administered 2021-11-25: 20 mg via INTRAVENOUS

## 2021-11-25 MED ORDER — ACETAMINOPHEN 500 MG PO TABS
1000.0000 mg | ORAL_TABLET | Freq: Once | ORAL | Status: AC
  Administered 2021-11-25: 1000 mg via ORAL
  Filled 2021-11-25: qty 2

## 2021-11-25 MED ORDER — METHOCARBAMOL 500 MG IVPB - SIMPLE MED: 500.0000 mg | Freq: Four times a day (QID) | INTRAVENOUS | Status: DC | PRN

## 2021-11-25 MED ORDER — BUPIVACAINE-EPINEPHRINE (PF) 0.25% -1:200000 IJ SOLN
INTRAMUSCULAR | Status: DC | PRN
Start: 2021-11-25 — End: 2021-11-25
  Administered 2021-11-25: 30 mL

## 2021-11-25 MED ORDER — HYDROCODONE-ACETAMINOPHEN 5-325 MG PO TABS: 1.0000 | ORAL_TABLET | ORAL | Status: DC | PRN

## 2021-11-25 MED ORDER — SODIUM CHLORIDE 0.9 % IV SOLN
2000.0000 mg | INTRAVENOUS | Status: DC
  Filled 2021-11-25: qty 20

## 2021-11-25 MED ORDER — FENTANYL CITRATE PF 50 MCG/ML IJ SOSY: 25.0000 ug | PREFILLED_SYRINGE | INTRAMUSCULAR | Status: DC | PRN

## 2021-11-25 MED ORDER — LACTATED RINGERS IV BOLUS
500.0000 mL | Freq: Once | INTRAVENOUS | Status: AC
  Administered 2021-11-25: 500 mL via INTRAVENOUS

## 2021-11-25 MED ORDER — ORAL CARE MOUTH RINSE: 15.0000 mL | Freq: Once | OROMUCOSAL | Status: AC

## 2021-11-25 MED ORDER — CEFAZOLIN SODIUM-DEXTROSE 2-4 GM/100ML-% IV SOLN: 2.0000 g | Freq: Four times a day (QID) | INTRAVENOUS | Status: DC

## 2021-11-25 MED ORDER — PROPOFOL 10 MG/ML IV BOLUS
INTRAVENOUS | Status: DC | PRN
  Administered 2021-11-25 (×3): 20 mg via INTRAVENOUS

## 2021-11-25 MED ORDER — ACETAMINOPHEN 500 MG PO TABS
500.0000 mg | ORAL_TABLET | Freq: Four times a day (QID) | ORAL | Status: DC
Start: 2021-11-25 — End: 2021-11-25

## 2021-11-25 MED ORDER — TRANEXAMIC ACID 1000 MG/10ML IV SOLN
INTRAVENOUS | Status: DC | PRN
  Administered 2021-11-25: 2000 mg via TOPICAL

## 2021-11-25 MED ORDER — BUPIVACAINE LIPOSOME 1.3 % IJ SUSP
10.0000 mL | Freq: Once | INTRAMUSCULAR | Status: DC
Start: 2021-11-25 — End: 2021-11-25

## 2021-11-25 MED ORDER — ONDANSETRON HCL 4 MG/2ML IJ SOLN: 4.0000 mg | Freq: Four times a day (QID) | INTRAMUSCULAR | Status: DC | PRN

## 2021-11-25 MED ORDER — PROPOFOL 10 MG/ML IV BOLUS
INTRAVENOUS | Status: AC
  Filled 2021-11-25: qty 20

## 2021-11-25 MED ORDER — ONDANSETRON HCL 4 MG/2ML IJ SOLN: 4.0000 mg | Freq: Once | INTRAMUSCULAR | Status: DC | PRN

## 2021-11-25 MED ORDER — KETOROLAC TROMETHAMINE 15 MG/ML IJ SOLN: 7.5000 mg | Freq: Four times a day (QID) | INTRAMUSCULAR | Status: DC

## 2021-11-25 MED ORDER — BUPIVACAINE LIPOSOME 1.3 % IJ SUSP
INTRAMUSCULAR | Status: DC | PRN
  Administered 2021-11-25: 10 mL

## 2021-11-25 MED ORDER — METOCLOPRAMIDE HCL 5 MG/ML IJ SOLN: 5.0000 mg | Freq: Three times a day (TID) | INTRAMUSCULAR | Status: DC | PRN

## 2021-11-25 MED ORDER — MORPHINE SULFATE (PF) 2 MG/ML IV SOLN: 0.5000 mg | INTRAVENOUS | Status: DC | PRN

## 2021-11-25 MED ORDER — BUPIVACAINE-EPINEPHRINE (PF) 0.25% -1:200000 IJ SOLN
INTRAMUSCULAR | Status: AC
  Filled 2021-11-25: qty 30

## 2021-11-25 MED ORDER — 0.9 % SODIUM CHLORIDE (POUR BTL) OPTIME
TOPICAL | Status: DC | PRN
  Administered 2021-11-25: 1000 mL

## 2021-11-25 MED ORDER — PHENYLEPHRINE HCL-NACL 20-0.9 MG/250ML-% IV SOLN
INTRAVENOUS | Status: DC | PRN
  Administered 2021-11-25: 50 ug/min via INTRAVENOUS

## 2021-11-25 MED ORDER — PHENYLEPHRINE HCL (PRESSORS) 10 MG/ML IV SOLN
INTRAVENOUS | Status: AC
  Filled 2021-11-25: qty 1

## 2021-11-25 MED ORDER — BUPIVACAINE LIPOSOME 1.3 % IJ SUSP
INTRAMUSCULAR | Status: AC
  Filled 2021-11-25: qty 10

## 2021-11-25 MED ORDER — CEFAZOLIN SODIUM-DEXTROSE 2-4 GM/100ML-% IV SOLN
2.0000 g | INTRAVENOUS | Status: AC
  Administered 2021-11-25: 2 g via INTRAVENOUS
  Filled 2021-11-25: qty 100

## 2021-11-25 MED ORDER — HYDROCODONE-ACETAMINOPHEN 7.5-325 MG PO TABS: 1.0000 | ORAL_TABLET | ORAL | Status: DC | PRN

## 2021-11-25 MED ORDER — MIDAZOLAM HCL 2 MG/2ML IJ SOLN
INTRAMUSCULAR | Status: AC
  Filled 2021-11-25: qty 2

## 2021-11-25 MED ORDER — FENTANYL CITRATE (PF) 100 MCG/2ML IJ SOLN
INTRAMUSCULAR | Status: AC
Start: 2021-11-25 — End: ?
  Filled 2021-11-25: qty 2

## 2021-11-25 MED ORDER — ACETAMINOPHEN 325 MG PO TABS: 325.0000 mg | ORAL_TABLET | Freq: Four times a day (QID) | ORAL | Status: DC | PRN

## 2021-11-25 MED ORDER — PROPOFOL 500 MG/50ML IV EMUL
INTRAVENOUS | Status: DC | PRN
  Administered 2021-11-25: 75 ug/kg/min via INTRAVENOUS

## 2021-11-25 MED ORDER — BUPIVACAINE IN DEXTROSE 0.75-8.25 % IT SOLN
INTRATHECAL | Status: DC | PRN
  Administered 2021-11-25: 1.6 mL via INTRATHECAL

## 2021-11-25 MED ORDER — ONDANSETRON HCL 4 MG PO TABS: 4.0000 mg | ORAL_TABLET | Freq: Four times a day (QID) | ORAL | Status: DC | PRN

## 2021-11-25 MED ORDER — ONDANSETRON HCL 4 MG/2ML IJ SOLN
INTRAMUSCULAR | Status: DC | PRN
  Administered 2021-11-25: 4 mg via INTRAVENOUS

## 2021-11-25 MED ORDER — CHLORHEXIDINE GLUCONATE 0.12 % MT SOLN
15.0000 mL | Freq: Once | OROMUCOSAL | Status: AC
  Administered 2021-11-25: 15 mL via OROMUCOSAL

## 2021-11-25 MED ORDER — METOCLOPRAMIDE HCL 5 MG PO TABS: 5.0000 mg | ORAL_TABLET | Freq: Three times a day (TID) | ORAL | Status: DC | PRN

## 2021-11-25 MED ORDER — TRANEXAMIC ACID-NACL 1000-0.7 MG/100ML-% IV SOLN: 1000.0000 mg | Freq: Once | INTRAVENOUS | Status: DC

## 2021-11-25 MED ORDER — POVIDONE-IODINE 10 % EX SWAB
2.0000 "application " | Freq: Once | CUTANEOUS | Status: AC
  Administered 2021-11-25: 2 via TOPICAL

## 2021-11-25 MED ORDER — METHOCARBAMOL 500 MG PO TABS: 500.0000 mg | ORAL_TABLET | Freq: Four times a day (QID) | ORAL | Status: DC | PRN

## 2021-11-25 SURGICAL SUPPLY — 48 items
BAG COUNTER SPONGE SURGICOUNT (BAG) ×1 IMPLANT
BAG DECANTER FOR FLEXI CONT (MISCELLANEOUS) ×3 IMPLANT
BAG SPNG CNTER NS LX DISP (BAG) ×1
BLADE SAW SGTL 18X1.27X75 (BLADE) ×3 IMPLANT
BOOTIES KNEE HIGH SLOAN (MISCELLANEOUS) ×3 IMPLANT
CELLS DAT CNTRL 66122 CELL SVR (MISCELLANEOUS) ×1 IMPLANT
COVER PERINEAL POST (MISCELLANEOUS) ×3 IMPLANT
COVER SURGICAL LIGHT HANDLE (MISCELLANEOUS) ×3 IMPLANT
CUP ACETABULAR GRIPTON 100 52 (Orthopedic Implant) IMPLANT
DRAPE FOOT SWITCH (DRAPES) ×3 IMPLANT
DRAPE IMP U-DRAPE 54X76 (DRAPES) ×3 IMPLANT
DRAPE STERI IOBAN 125X83 (DRAPES) ×3 IMPLANT
DRAPE U-SHAPE 47X51 STRL (DRAPES) ×6 IMPLANT
DRSG AQUACEL AG ADV 3.5X 6 (GAUZE/BANDAGES/DRESSINGS) ×3 IMPLANT
DURAPREP 26ML APPLICATOR (WOUND CARE) ×3 IMPLANT
ELECT BLADE TIP CTD 4 INCH (ELECTRODE) ×3 IMPLANT
ELECT REM PT RETURN 15FT ADLT (MISCELLANEOUS) ×3 IMPLANT
ELIMINATOR HOLE APEX DEPUY (Hips) ×1 IMPLANT
GLOVE SRG 8 PF TXTR STRL LF DI (GLOVE) ×4 IMPLANT
GLOVE SURG ENC MOIS LTX SZ8 (GLOVE) ×6 IMPLANT
GLOVE SURG UNDER POLY LF SZ8 (GLOVE) ×4
GOWN STRL REUS W/TWL XL LVL3 (GOWN DISPOSABLE) ×6 IMPLANT
GRIPTON 100 52 (Orthopedic Implant) ×2 IMPLANT
HEAD M SROM 36MM PLUS 1.5 (Hips) IMPLANT
HOLDER FOLEY CATH W/STRAP (MISCELLANEOUS) ×3 IMPLANT
KIT TURNOVER KIT A (KITS) IMPLANT
LINER ACETAB NEUTRAL 36ID 520D (Liner) ×1 IMPLANT
MANIFOLD NEPTUNE II (INSTRUMENTS) ×3 IMPLANT
NEEDLE HYPO 22GX1.5 SAFETY (NEEDLE) ×3 IMPLANT
NS IRRIG 1000ML POUR BTL (IV SOLUTION) ×3 IMPLANT
PACK ANTERIOR HIP CUSTOM (KITS) ×3 IMPLANT
PENCIL SMOKE EVACUATOR (MISCELLANEOUS) IMPLANT
PROTECTOR NERVE ULNAR (MISCELLANEOUS) ×3 IMPLANT
RETRACTOR WND ALEXIS 18 MED (MISCELLANEOUS) ×2 IMPLANT
RTRCTR WOUND ALEXIS 18CM MED (MISCELLANEOUS) ×2
SPIKE FLUID TRANSFER (MISCELLANEOUS) ×2 IMPLANT
SPONGE T-LAP 18X18 ~~LOC~~+RFID (SPONGE) ×2 IMPLANT
SROM M HEAD 36MM PLUS 1.5 (Hips) ×2 IMPLANT
STEM FEMORAL SZ 5MM STD ACTIS (Stem) ×1 IMPLANT
SUT ETHIBOND NAB CT1 #1 30IN (SUTURE) ×6 IMPLANT
SUT VIC AB 1 CT1 36 (SUTURE) ×3 IMPLANT
SUT VIC AB 2-0 CT1 27 (SUTURE) ×2
SUT VIC AB 2-0 CT1 TAPERPNT 27 (SUTURE) ×2 IMPLANT
SUT VICRYL AB 3-0 FS1 BRD 27IN (SUTURE) ×3 IMPLANT
SUT VLOC 180 0 24IN GS25 (SUTURE) ×3 IMPLANT
SYR 50ML LL SCALE MARK (SYRINGE) ×3 IMPLANT
TRAY FOLEY MTR SLVR 14FR STAT (SET/KITS/TRAYS/PACK) ×1 IMPLANT
TRAY FOLEY MTR SLVR 16FR STAT (SET/KITS/TRAYS/PACK) ×2 IMPLANT

## 2021-11-25 NOTE — Transfer of Care (Signed)
Immediate Anesthesia Transfer of Care Note  Patient: Jacqueline Mata  Procedure(s) Performed: LEFT TOTAL HIP ARTHROPLASTY ANTERIOR APPROACH (Left: Hip)  Patient Location: PACU  Anesthesia Type:Spinal  Level of Consciousness: awake and alert   Airway & Oxygen Therapy: Patient Spontanous Breathing and Patient connected to face mask oxygen  Post-op Assessment: Report given to RN and Post -op Vital signs reviewed and stable  Post vital signs: Reviewed and stable  Last Vitals:  Vitals Value Taken Time  BP 108/93 11/25/21 0918  Temp    Pulse 86 11/25/21 0921  Resp 21 11/25/21 0921  SpO2 100 % 11/25/21 0921  Vitals shown include unvalidated device data.  Last Pain:  Vitals:   11/25/21 0555  TempSrc: Oral  PainSc:       Patients Stated Pain Goal: 4 (26/33/35 4562)  Complications: No notable events documented.

## 2021-11-25 NOTE — Anesthesia Postprocedure Evaluation (Signed)
Anesthesia Post Note  Patient: Jacqueline Mata  Procedure(s) Performed: LEFT TOTAL HIP ARTHROPLASTY ANTERIOR APPROACH (Left: Hip)     Patient location during evaluation: PACU Anesthesia Type: Spinal Level of consciousness: awake and alert Pain management: pain level controlled Vital Signs Assessment: post-procedure vital signs reviewed and stable Respiratory status: spontaneous breathing and respiratory function stable Cardiovascular status: blood pressure returned to baseline and stable Postop Assessment: spinal receding and no apparent nausea or vomiting Anesthetic complications: no   No notable events documented.  Last Vitals:  Vitals:   11/25/21 1000 11/25/21 1019  BP: (!) 108/91 114/68  Pulse: 83 88  Resp: 20 10  Temp:    SpO2: 100% 98%    Last Pain:  Vitals:   11/25/21 1000  TempSrc:   PainSc: 0-No pain                 Audry Pili

## 2021-11-25 NOTE — Anesthesia Procedure Notes (Signed)
Date/Time: 11/25/2021 7:30 AM Performed by: Cynda Familia, CRNA Pre-anesthesia Checklist: Patient identified, Emergency Drugs available, Suction available, Patient being monitored and Timeout performed Oxygen Delivery Method: Simple face mask Placement Confirmation: positive ETCO2 and breath sounds checked- equal and bilateral Dental Injury: Teeth and Oropharynx as per pre-operative assessment

## 2021-11-25 NOTE — Anesthesia Procedure Notes (Signed)
Spinal  Patient location during procedure: OR Start time: 11/25/2021 7:36 AM End time: 11/25/2021 7:39 AM Reason for block: surgical anesthesia Staffing Performed: anesthesiologist  Anesthesiologist: Audry Pili, MD Preanesthetic Checklist Completed: patient identified, IV checked, risks and benefits discussed, surgical consent, monitors and equipment checked, pre-op evaluation and timeout performed Spinal Block Patient position: sitting Prep: DuraPrep Patient monitoring: heart rate, cardiac monitor, continuous pulse ox and blood pressure Approach: midline Location: L3-4 Injection technique: single-shot Needle Needle type: Pencan  Needle gauge: 24 G Additional Notes Consent was obtained prior to the procedure with all questions answered and concerns addressed. Risks including, but not limited to, bleeding, infection, nerve damage, paralysis, failed block, inadequate analgesia, allergic reaction, high spinal, itching, and headache were discussed and the patient wished to proceed. Functioning IV was confirmed and monitors were applied. Sterile prep and drape, including hand hygiene, mask, and sterile gloves were used. The patient was positioned and the spine was prepped. The skin was anesthetized with lidocaine. Free flow of clear CSF was obtained prior to injecting local anesthetic into the CSF. The spinal needle aspirated freely following injection. The needle was carefully withdrawn. The patient tolerated the procedure well.   Renold Don, MD

## 2021-11-25 NOTE — Evaluation (Signed)
Physical Therapy Evaluation Patient Details Name: Jacqueline Mata MRN: [id number] DOB: 05-17-1941 Today's Date: 11/25/2021  History of Present Illness  81 yo female s/p L DA-THA on 2/28. PMH significant for HLD, Hypothyroidism, breast cancer, OA, DM, bil TKAs, R THA 10/2020, CKD III, cervical radiculopathy.  Clinical Impression  Pt presents with limited ROM, strength, and pain in L Hip. These impairments are limiting her ability to safely ambulate in her house and community, functional tranfers, and perform all ADLs/iADLs. Pt to benefit from acute PT to address deficits. Pt ambulated 100 feet with RW with min guard for safety and up/down 2 steps. Pt educated on quad sets (5-10/hour), ankle pumps (20/hour), and heel slides (5-10/hour) to perform this afternoon/evening to lessen stiffness and increase circulation, to pt's tolerance and limited by pain. As well as at home walking program. Plan for Home Health PT follow up once medically stable for d/c. PT to progress mobility as tolerated, and will continue to follow acutely.       Recommendations for follow up therapy are one component of a multi-disciplinary discharge planning process, led by the attending physician.  Recommendations may be updated based on patient status, additional functional criteria and insurance authorization.  Follow Up Recommendations Home health PT    Assistance Recommended at Discharge Set up Supervision/Assistance  Patient can return home with the following  A little help with walking and/or transfers;A little help with bathing/dressing/bathroom;Assist for transportation    Equipment Recommendations None recommended by PT  Recommendations for Other Services       Functional Status Assessment Patient has had a recent decline in their functional status and demonstrates the ability to make significant improvements in function in a reasonable and predictable amount of time.     Precautions / Restrictions  Precautions Precautions: Fall Restrictions Weight Bearing Restrictions: No      Mobility  Bed Mobility Overal bed mobility: Needs Assistance Bed Mobility: Supine to Sit, Sit to Supine     Supine to sit: Min guard Sit to supine: Min guard   General bed mobility comments: Min guard for saftey    Transfers Overall transfer level: Needs assistance Equipment used: Rolling walker (2 wheels) Transfers: Sit to/from Stand Sit to Stand: Min guard           General transfer comment: Min guard for saftey, Sitting BP 129/96, After ambulation 110/97    Ambulation/Gait Ambulation/Gait assistance: Min guard Gait Distance (Feet): 100 Feet Assistive device: Rolling walker (2 wheels) Gait Pattern/deviations: Step-through pattern, Decreased step length - left, Decreased stance time - left, Trunk flexed Gait velocity: decreased     General Gait Details: Pt. cued for upright trunk to maintain hip extension, she reports no significiant increase in pain throughout  Stairs Stairs: Yes Stairs assistance: Min guard Stair Management: Step to pattern Number of Stairs: 2 General stair comments: Pt. able to state proper stair techniques and cued to not perform step through pattern for the first few days.  Wheelchair Mobility    Modified Rankin (Stroke Patients Only)       Balance Overall balance assessment: Needs assistance Sitting-balance support: No upper extremity supported Sitting balance-Leahy Scale: Fair     Standing balance support: Bilateral upper extremity supported Standing balance-Leahy Scale: Poor Standing balance comment: Pt. able to stand without challenge, reports only slight lightheadedness but no increase from positional change.  Pertinent Vitals/Pain Pain Assessment Pain Assessment: 0-10 Pain Score: 4  Pain Location: L Hip, pt. says it slightly increases from 3-4 with ambulation or exercises but quickly calms down. Pain  Descriptors / Indicators: Aching, Sore Pain Intervention(s): Limited activity within patient's tolerance, Monitored during session    Home Living Family/patient expects to be discharged to:: Private residence Living Arrangements: Alone Available Help at Discharge: Family;Available 24 hours/day Type of Home: House Home Access: Stairs to enter Entrance Stairs-Rails: Right;Left;Can reach both Entrance Stairs-Number of Steps: 4 Alternate Level Stairs-Number of Steps: flight Home Layout: Able to live on main level with bedroom/bathroom;Two level Home Equipment: Tub bench;Rolling Walker (2 wheels);Toilet riser;Grab bars - tub/shower      Prior Function Prior Level of Function : Driving;Independent/Modified Independent             Mobility Comments: reports ambulatory without device ADLs Comments: no assist needed prior to surgery     Hand Dominance   Dominant Hand: Right    Extremity/Trunk Assessment   Upper Extremity Assessment Upper Extremity Assessment: Overall WFL for tasks assessed    Lower Extremity Assessment Lower Extremity Assessment: LLE deficits/detail LLE Deficits / Details: Hip flexion ~ 70 degrees, able to perform functional Hip bridge x3 LLE: Unable to fully assess due to pain       Communication   Communication: No difficulties  Cognition Arousal/Alertness: Awake/alert Behavior During Therapy: WFL for tasks assessed/performed Overall Cognitive Status: Within Functional Limits for tasks assessed                                 General Comments: A&O x4        General Comments General comments (skin integrity, edema, etc.): Pt. able to tolerate standing well and cued to not weight bear through walker and only use it for balance support.    Exercises Total Joint Exercises Ankle Circles/Pumps: AROM, Left, 20 reps Quad Sets: AROM, Left, 10 reps Heel Slides: AROM, Left, 10 reps   Assessment/Plan    PT Assessment All further PT needs  can be met in the next venue of care  PT Problem List Decreased strength;Decreased mobility;Decreased range of motion;Decreased coordination;Decreased activity tolerance;Decreased balance       PT Treatment Interventions Other (comment) (Intervention completed upon eval)    PT Goals (Current goals can be found in the Care Plan section)  Acute Rehab PT Goals Patient Stated Goal: To return to walking and home PT Goal Formulation: With patient Time For Goal Achievement: 11/25/21 Potential to Achieve Goals: Good    Frequency Other (Comment) (1 time eval)     Co-evaluation               AM-PAC PT "6 Clicks" Mobility  Outcome Measure Help needed turning from your back to your side while in a flat bed without using bedrails?: A Little Help needed moving from lying on your back to sitting on the side of a flat bed without using bedrails?: A Little Help needed moving to and from a bed to a chair (including a wheelchair)?: A Little Help needed standing up from a chair using your arms (e.g., wheelchair or bedside chair)?: A Little Help needed to walk in hospital room?: A Little Help needed climbing 3-5 steps with a railing? : A Little 6 Click Score: 18    End of Session Equipment Utilized During Treatment: Gait belt Activity Tolerance: Patient tolerated treatment well Patient left: in  bed;with call bell/phone within reach Nurse Communication: Mobility status PT Visit Diagnosis: Muscle weakness (generalized) (M62.81);Pain Pain - Right/Left: Left Pain - part of body: Hip    Time: [id number] PT Time Calculation (min) (ACUTE ONLY): 30 min   Charges:   PT Evaluation $PT Eval Low Complexity: 1 Low PT Treatments $Gait Training: 8-22 mins        Thermon Leyland, SPT Acute Rehab Services   Thermon Leyland 11/25/2021, 12:54 PM

## 2021-11-25 NOTE — Op Note (Signed)
PRE-OP DIAGNOSIS:  LEFT HIP DEGENERATIVE JOINT DISEASE POST-OP DIAGNOSIS: same PROCEDURE:  LEFT TOTAL HIP ARTHROPLASTY ANTERIOR APPROACH ANESTHESIA:  Spinal and MAC SURGEON:  Melrose Nakayama MD ASSISTANT:  Loni Dolly PA-C   INDICATIONS FOR PROCEDURE:  The patient is a 81 y.o. female with a long history of a painful hip.  This has persisted despite multiple conservative measures.  The patient has persisted with pain and dysfunction making rest and activity difficult.  A total hip replacement is offered as surgical treatment.  Informed operative consent was obtained after discussion of possible complications including reaction to anesthesia, infection, neurovascular injury, dislocation, DVT, PE, and death.  The importance of the postoperative rehab program to optimize result was stressed with the patient.  SUMMARY OF FINDINGS AND PROCEDURE:  Under the above anesthesia through a anterior approach an the Hana table a left THR was performed.  The patient had severe degenerative change and good bone quality.  We used DePuy components to replace the hip and these were size 5 Actis femur capped with a -1.5 29m metal hip ball.  On the acetabular side we used a size 52 Gription shell with a plus 0 neutral polyethylene liner.  We did use a hole eliminator.  ALoni DollyPA-C assisted throughout and was invaluable to the completion of the case in that he helped position and retract while I performed the procedure.  He also closed simultaneously to help minimize OR time.  I used fluoroscopy throughout the case to check position of components and leg lengths and read all these views myself.  DESCRIPTION OF PROCEDURE:  The patient was taken to the OR suite where the above anesthetic was applied.  The patient was then positioned on the Hana table supine.  All bony prominences were appropriately padded.  Prep and drape was then performed in normal sterile fashion.  The patient was given kefzol preoperative antibiotic and  an appropriate time out was performed.  We then took an anterior approach to the left hip.  Dissection was taken through adipose to the tensor fascia lata fascia.  This structure was incised longitudinally and we dissected in the intermuscular interval just medial to this muscle.  Cobra retractors were placed superior and inferior to the femoral neck superficial to the capsule.  A capsular incision was then made and the retractors were placed along the femoral neck.  Xray was brought in to get a good level for the femoral neck cut which was made with an oscillating saw and osteotome.  The femoral head was removed with a corkscrew.  The acetabulum was exposed and some labral tissues were excised. Reaming was taken to the inside wall of the pelvis and sequentially up to 1 mm smaller than the actual component.  A trial of components was done and then the aforementioned acetabular shell was placed in appropriate tilt and anteversion confirmed by fluoroscopy. The liner was placed along with the hole eliminator and attention was turned to the femur.  The leg was brought down and over into adduction and the elevator bar was used to raise the femur up gently in the wound.  The piriformis was released with care taken to preserve the obturator internus attachment and all of the posterior capsule. The femur was reamed and then broached to the appropriate size.  A trial reduction was done and the aforementioned head and neck assembly gave uKoreathe best stability in extension with external rotation.  Leg lengths were felt to be about equal by fluoroscopic  exam.  The trial components were removed and the wound irrigated.  We then placed the femoral component in appropriate anteversion.  The head was applied to a dry stem neck and the hip again reduced.  It was again stable in the aforementioned position.  The would was irrigated again followed by re-approximation of anterior capsule with ethibond suture. Tensor fascia was repaired  with V-loc suture  followed by deep closure with #O and #2 undyed vicryl.  Skin was closed with subQ stitch and steristrips followed by a sterile dressing.  EBL and IOF can be obtained from anesthesia records.  DISPOSITION:  The patient was taken to PACU to potentially go home same day depending on ability to walk and tolerate liquids.

## 2021-11-26 ENCOUNTER — Encounter (HOSPITAL_COMMUNITY): Payer: Self-pay | Admitting: Orthopaedic Surgery

## 2021-11-26 DIAGNOSIS — M9901 Segmental and somatic dysfunction of cervical region: Secondary | ICD-10-CM | POA: Diagnosis not present

## 2021-11-26 DIAGNOSIS — M5416 Radiculopathy, lumbar region: Secondary | ICD-10-CM | POA: Diagnosis not present

## 2021-11-26 DIAGNOSIS — M9903 Segmental and somatic dysfunction of lumbar region: Secondary | ICD-10-CM | POA: Diagnosis not present

## 2021-11-26 DIAGNOSIS — M62830 Muscle spasm of back: Secondary | ICD-10-CM | POA: Diagnosis not present

## 2021-11-27 DIAGNOSIS — G8929 Other chronic pain: Secondary | ICD-10-CM | POA: Diagnosis not present

## 2021-11-27 DIAGNOSIS — E039 Hypothyroidism, unspecified: Secondary | ICD-10-CM | POA: Diagnosis not present

## 2021-11-27 DIAGNOSIS — Z471 Aftercare following joint replacement surgery: Secondary | ICD-10-CM | POA: Diagnosis not present

## 2021-11-27 DIAGNOSIS — Z85828 Personal history of other malignant neoplasm of skin: Secondary | ICD-10-CM | POA: Diagnosis not present

## 2021-11-27 DIAGNOSIS — Z87891 Personal history of nicotine dependence: Secondary | ICD-10-CM | POA: Diagnosis not present

## 2021-11-27 DIAGNOSIS — E785 Hyperlipidemia, unspecified: Secondary | ICD-10-CM | POA: Diagnosis not present

## 2021-11-27 DIAGNOSIS — E663 Overweight: Secondary | ICD-10-CM | POA: Diagnosis not present

## 2021-11-27 DIAGNOSIS — Z6831 Body mass index (BMI) 31.0-31.9, adult: Secondary | ICD-10-CM | POA: Diagnosis not present

## 2021-11-27 DIAGNOSIS — Z853 Personal history of malignant neoplasm of breast: Secondary | ICD-10-CM | POA: Diagnosis not present

## 2021-11-27 DIAGNOSIS — M5010 Cervical disc disorder with radiculopathy, unspecified cervical region: Secondary | ICD-10-CM | POA: Diagnosis not present

## 2021-11-27 DIAGNOSIS — Z96643 Presence of artificial hip joint, bilateral: Secondary | ICD-10-CM | POA: Diagnosis not present

## 2021-11-27 DIAGNOSIS — I129 Hypertensive chronic kidney disease with stage 1 through stage 4 chronic kidney disease, or unspecified chronic kidney disease: Secondary | ICD-10-CM | POA: Diagnosis not present

## 2021-11-27 DIAGNOSIS — G4730 Sleep apnea, unspecified: Secondary | ICD-10-CM | POA: Diagnosis not present

## 2021-11-27 DIAGNOSIS — N183 Chronic kidney disease, stage 3 unspecified: Secondary | ICD-10-CM | POA: Diagnosis not present

## 2021-11-27 DIAGNOSIS — M19019 Primary osteoarthritis, unspecified shoulder: Secondary | ICD-10-CM | POA: Diagnosis not present

## 2021-11-27 DIAGNOSIS — E1169 Type 2 diabetes mellitus with other specified complication: Secondary | ICD-10-CM | POA: Diagnosis not present

## 2021-11-27 DIAGNOSIS — M75101 Unspecified rotator cuff tear or rupture of right shoulder, not specified as traumatic: Secondary | ICD-10-CM | POA: Diagnosis not present

## 2021-11-27 DIAGNOSIS — N3946 Mixed incontinence: Secondary | ICD-10-CM | POA: Diagnosis not present

## 2021-12-03 DIAGNOSIS — M5416 Radiculopathy, lumbar region: Secondary | ICD-10-CM | POA: Diagnosis not present

## 2021-12-03 DIAGNOSIS — M62830 Muscle spasm of back: Secondary | ICD-10-CM | POA: Diagnosis not present

## 2021-12-03 DIAGNOSIS — M9903 Segmental and somatic dysfunction of lumbar region: Secondary | ICD-10-CM | POA: Diagnosis not present

## 2021-12-03 DIAGNOSIS — M9901 Segmental and somatic dysfunction of cervical region: Secondary | ICD-10-CM | POA: Diagnosis not present

## 2021-12-08 DIAGNOSIS — Z9889 Other specified postprocedural states: Secondary | ICD-10-CM | POA: Diagnosis not present

## 2021-12-08 DIAGNOSIS — M1612 Unilateral primary osteoarthritis, left hip: Secondary | ICD-10-CM | POA: Diagnosis not present

## 2021-12-17 DIAGNOSIS — M62830 Muscle spasm of back: Secondary | ICD-10-CM | POA: Diagnosis not present

## 2021-12-17 DIAGNOSIS — M5416 Radiculopathy, lumbar region: Secondary | ICD-10-CM | POA: Diagnosis not present

## 2021-12-17 DIAGNOSIS — M9903 Segmental and somatic dysfunction of lumbar region: Secondary | ICD-10-CM | POA: Diagnosis not present

## 2021-12-17 DIAGNOSIS — M9901 Segmental and somatic dysfunction of cervical region: Secondary | ICD-10-CM | POA: Diagnosis not present

## 2021-12-22 ENCOUNTER — Other Ambulatory Visit: Payer: PPO

## 2021-12-31 DIAGNOSIS — M9903 Segmental and somatic dysfunction of lumbar region: Secondary | ICD-10-CM | POA: Diagnosis not present

## 2021-12-31 DIAGNOSIS — M62830 Muscle spasm of back: Secondary | ICD-10-CM | POA: Diagnosis not present

## 2021-12-31 DIAGNOSIS — M5416 Radiculopathy, lumbar region: Secondary | ICD-10-CM | POA: Diagnosis not present

## 2021-12-31 DIAGNOSIS — M9901 Segmental and somatic dysfunction of cervical region: Secondary | ICD-10-CM | POA: Diagnosis not present

## 2022-01-08 ENCOUNTER — Encounter: Payer: Self-pay | Admitting: Hematology & Oncology

## 2022-01-08 ENCOUNTER — Telehealth: Payer: Self-pay | Admitting: *Deleted

## 2022-01-08 ENCOUNTER — Inpatient Hospital Stay: Payer: PPO | Admitting: Hematology & Oncology

## 2022-01-08 ENCOUNTER — Inpatient Hospital Stay: Payer: PPO | Attending: Hematology & Oncology

## 2022-01-08 VITALS — BP 145/77 | HR 87 | Temp 97.8°F | Resp 19 | Wt 169.0 lb

## 2022-01-08 DIAGNOSIS — C50919 Malignant neoplasm of unspecified site of unspecified female breast: Secondary | ICD-10-CM

## 2022-01-08 DIAGNOSIS — Z170 Estrogen receptor positive status [ER+]: Secondary | ICD-10-CM | POA: Diagnosis not present

## 2022-01-08 DIAGNOSIS — Z853 Personal history of malignant neoplasm of breast: Secondary | ICD-10-CM | POA: Diagnosis not present

## 2022-01-08 LAB — CBC WITH DIFFERENTIAL (CANCER CENTER ONLY)
Abs Immature Granulocytes: 0.03 10*3/uL (ref 0.00–0.07)
Basophils Absolute: 0.1 10*3/uL (ref 0.0–0.1)
Basophils Relative: 1 %
Eosinophils Absolute: 0.6 10*3/uL — ABNORMAL HIGH (ref 0.0–0.5)
Eosinophils Relative: 7 %
HCT: 39.4 % (ref 36.0–46.0)
Hemoglobin: 13 g/dL (ref 12.0–15.0)
Immature Granulocytes: 0 %
Lymphocytes Relative: 27 %
Lymphs Abs: 2.2 10*3/uL (ref 0.7–4.0)
MCH: 30.7 pg (ref 26.0–34.0)
MCHC: 33 g/dL (ref 30.0–36.0)
MCV: 93.1 fL (ref 80.0–100.0)
Monocytes Absolute: 0.5 10*3/uL (ref 0.1–1.0)
Monocytes Relative: 7 %
Neutro Abs: 4.7 10*3/uL (ref 1.7–7.7)
Neutrophils Relative %: 58 %
Platelet Count: 189 10*3/uL (ref 150–400)
RBC: 4.23 MIL/uL (ref 3.87–5.11)
RDW: 13.9 % (ref 11.5–15.5)
WBC Count: 8 10*3/uL (ref 4.0–10.5)
nRBC: 0 % (ref 0.0–0.2)

## 2022-01-08 LAB — CMP (CANCER CENTER ONLY)
ALT: 31 U/L (ref 0–44)
AST: 27 U/L (ref 15–41)
Albumin: 4.4 g/dL (ref 3.5–5.0)
Alkaline Phosphatase: 97 U/L (ref 38–126)
Anion gap: 9 (ref 5–15)
BUN: 16 mg/dL (ref 8–23)
CO2: 27 mmol/L (ref 22–32)
Calcium: 10.4 mg/dL — ABNORMAL HIGH (ref 8.9–10.3)
Chloride: 103 mmol/L (ref 98–111)
Creatinine: 0.93 mg/dL (ref 0.44–1.00)
GFR, Estimated: >60 mL/min (ref >60)
Glucose, Bld: 106 mg/dL — ABNORMAL HIGH (ref 70–99)
Potassium: 4.4 mmol/L (ref 3.5–5.1)
Sodium: 139 mmol/L (ref 135–145)
Total Bilirubin: 1 mg/dL (ref 0.3–1.2)
Total Protein: 7.1 g/dL (ref 6.5–8.1)

## 2022-01-08 LAB — LACTATE DEHYDROGENASE: LDH: 190 U/L (ref 98–192)

## 2022-01-08 NOTE — Progress Notes (Signed)
Hematology and Oncology Follow Up Visit ? ?Jacqueline Mata ?[id number] ?1941/07/26 81 y.o. ?01/08/2022 ? ? ?Principle Diagnosis:  ?Synchronous bilateral stage IIA (T2 N0 M0) ductal carcinoma of bilateral breast - ER+/HER2-. ? ?Current Therapy:   ?Observation ?    ?Interim History:  Ms.  Mata is comes in for followup.  We see her every 6 months.  She just had her 81th birthday.  Also happy for her.  She had a wonderful 81th birthday. ? ?She had no problems over the holiday season. ? ?She has been active.  I think she is trying to exercise.  She is trying to go to the gym. ? ?She had left hip surgery in February.  I must say she looks fantastic.  I am not surprised as Dr. Rhona Raider did the surgery. ? ?She has had no problems with COVID.  She has had no issues with nausea or vomiting.  She has had no change in bowel or bladder habits.  She has had no cough or shortness of breath. ? ?Overall, her performance status is ECOG 0.   ? ?Medications:  ?Current Outpatient Medications:  ?  amoxicillin (AMOXIL) 500 MG capsule, Take 2,000 mg by mouth See admin instructions. Dental procedures. Takes $RemoveBeforeDEI'2000mg'[id number]$  one hour prior to dental procedures. (Patient not taking: Reported on 11/10/2021), Disp: , Rfl:  ?  blood glucose meter kit and supplies, Dispense based on patient and insurance preference. Use to check blood sugars one time daily. (ICD-10 E11.9)., Disp: 1 each, Rfl: 0 ?  Cholecalciferol (VITAMIN D) 50 MCG (2000 UT) tablet, Take 2,000 Units by mouth daily., Disp: , Rfl:  ?  COLLAGEN PO, Take 1 Scoop by mouth every other day. With Probiotic, Disp: , Rfl:  ?  fluconazole (DIFLUCAN) 200 MG tablet, Take 200 mg by mouth once a week., Disp: , Rfl:  ?  folic acid (FOLVITE) 532 MCG tablet, Take 800 mcg by mouth daily., Disp: , Rfl:  ?  Ivermectin 1 % CREA, Apply 1 application topically every evening. (Patient not taking: Reported on 11/10/2021), Disp: , Rfl:  ?  levothyroxine (SYNTHROID) 88 MCG tablet, TAKE 1 TABLET BY MOUTH DAILY BEFORE  BREAKFAST, Disp: 90 tablet, Rfl: 0 ?  loratadine (CLARITIN) 10 MG tablet, Take 10 mg by mouth daily., Disp: , Rfl:  ?  OVER THE COUNTER MEDICATION, Place 2 sprays into the nose daily. Sovereign Silver Nasal Spray (Immune Support), Disp: , Rfl:  ?  Propylene Glycol (SYSTANE BALANCE) 0.6 % SOLN, Place 1 drop into both eyes daily as needed (dry eyes)., Disp: , Rfl:  ?  vitamin B-12 (CYANOCOBALAMIN) 1000 MCG tablet, Take 1,000 mcg by mouth daily., Disp: , Rfl:  ? ?Allergies:  ?Allergies  ?Allergen Reactions  ? Codeine Shortness Of Breath and Other (See Comments)  ?  Altered mental staus  ? Ilevro [Nepafenac] Other (See Comments)  ?  Eye swelling ?Eye drops   ? Lamisil [Terbinafine] Rash and Other (See Comments)  ?  Dysphagia and skin fungus  ? Other Swelling  ?  DOGS.  Congestion. ?FEATHERS.  Congestion ? ?Can not take preservative free eye drops    ? Maxitrol [Neomycin-Polymyxin-Dexameth]   ?  dry eyes   ? Metformin And Related   ?  Shakiness   ? Augmentin [Amoxicillin-Pot Clavulanate] Itching and Rash  ? Molds & Smuts Other (See Comments)  ?  Congestion.  ? Neomycin Rash  ? Neomycin-Bacitracin Zn-Polymyx Rash  ? Neomycin-Polymyxin-Hc Rash  ? Tape Dermatitis  ?  Blisters  ?  Tapentadol Nausea And Vomiting  ?  Nucynta  ? Terbinafine Rash  ?  Trouble swallowing  ? Tramadol Other (See Comments), Nausea Only and Nausea And Vomiting  ?  dizziness  ? ? ?Past Medical History, Surgical history, Social history, and Family History were reviewed and updated. ? ?Review of Systems: ?Review of Systems  ?Constitutional: Negative.   ?HENT: Negative.    ?Eyes: Negative.   ?Respiratory: Negative.    ?Cardiovascular: Negative.   ?Gastrointestinal: Negative.   ?Genitourinary: Negative.   ?Musculoskeletal: Negative.   ?Skin: Negative.   ?Neurological: Negative.   ?Endo/Heme/Allergies: Negative.   ?Psychiatric/Behavioral: Negative.    ? ? ?Physical Exam: ? weight is 169 lb (76.7 kg). Her oral temperature is 97.8 ?F (36.6 ?C). Her blood  pressure is 145/77 (abnormal) and her pulse is 87. Her respiration is 19 and oxygen saturation is 99%.  ? ?Physical Exam ?Vitals reviewed.  ?HENT:  ?   Head: Normocephalic and atraumatic.  ?Eyes:  ?   Pupils: Pupils are equal, round, and reactive to light.  ?Cardiovascular:  ?   Rate and Rhythm: Normal rate and regular rhythm.  ?   Heart sounds: Normal heart sounds.  ?Pulmonary:  ?   Effort: Pulmonary effort is normal.  ?   Breath sounds: Normal breath sounds.  ?Abdominal:  ?   General: Bowel sounds are normal.  ?   Palpations: Abdomen is soft.  ?Musculoskeletal:     ?   General: No tenderness or deformity. Normal range of motion.  ?   Cervical back: Normal range of motion.  ?Lymphadenopathy:  ?   Cervical: No cervical adenopathy.  ?Skin: ?   General: Skin is warm and dry.  ?   Findings: No erythema or rash.  ?Neurological:  ?   Mental Status: She is alert and oriented to person, place, and time.  ?Psychiatric:     ?   Behavior: Behavior normal.     ?   Thought Content: Thought content normal.     ?   Judgment: Judgment normal.  ? ?Lab Results  ?Component Value Date  ? WBC 8.0 01/08/2022  ? HGB 13.0 01/08/2022  ? HCT 39.4 01/08/2022  ? MCV 93.1 01/08/2022  ? PLT 189 01/08/2022  ? ?  Chemistry   ?   ?Component Value Date/Time  ? NA 139 01/08/2022 1134  ? NA 135 04/29/2017 1157  ? NA 138 10/29/2016 1021  ? K 4.4 01/08/2022 1134  ? K 3.7 04/29/2017 1157  ? K 4.2 10/29/2016 1021  ? CL 103 01/08/2022 1134  ? CL 102 04/29/2017 1157  ? CO2 27 01/08/2022 1134  ? CO2 27 04/29/2017 1157  ? CO2 24 10/29/2016 1021  ? BUN 16 01/08/2022 1134  ? BUN 9 04/29/2017 1157  ? BUN 17.3 10/29/2016 1021  ? CREATININE 0.93 01/08/2022 1134  ? CREATININE 0.9 04/29/2017 1157  ? CREATININE 0.9 10/29/2016 1021  ?    ?Component Value Date/Time  ? CALCIUM 10.4 (H) 01/08/2022 1134  ? CALCIUM 9.2 04/29/2017 1157  ? CALCIUM 10.0 10/29/2016 1021  ? ALKPHOS 97 01/08/2022 1134  ? ALKPHOS 64 04/29/2017 1157  ? ALKPHOS 71 10/29/2016 1021  ? AST 27  01/08/2022 1134  ? AST 24 10/29/2016 1021  ? ALT 31 01/08/2022 1134  ? ALT 37 04/29/2017 1157  ? ALT 26 10/29/2016 1021  ? BILITOT 1.0 01/08/2022 1134  ? BILITOT 1.18 10/29/2016 1021  ?  ? ? ? ?Impression and Plan: ?Ms. [place] is  81 year old white female with history of synchronous bilateral breast cancer. Fortunately both breast cancers were node negative. They were ER positive and HER-2 negative.  ? ?It has now been close to 18 years since she was treated for the breast cancer.  I really have to believe that she is going to be cured since her cancers were node negative. ? ?I am just so glad that she is doing well.  I am so happy that she had [birth year]81 birthday that she is able to enjoy.  I am happy that her hip surgery went so well.  I know she will be to exercise more now.  ? ?We will plan for another follow-up in 7 months. ? ? ? ?Volanda Napoleon, MD ?4/13/202312:28 PM ? ?

## 2022-01-08 NOTE — Telephone Encounter (Signed)
Per 01/08/22 los - gave upcoming appointments - confirmed ?

## 2022-01-14 DIAGNOSIS — M9903 Segmental and somatic dysfunction of lumbar region: Secondary | ICD-10-CM | POA: Diagnosis not present

## 2022-01-14 DIAGNOSIS — M5416 Radiculopathy, lumbar region: Secondary | ICD-10-CM | POA: Diagnosis not present

## 2022-01-14 DIAGNOSIS — M9901 Segmental and somatic dysfunction of cervical region: Secondary | ICD-10-CM | POA: Diagnosis not present

## 2022-01-14 DIAGNOSIS — M62830 Muscle spasm of back: Secondary | ICD-10-CM | POA: Diagnosis not present

## 2022-01-15 ENCOUNTER — Other Ambulatory Visit: Payer: Self-pay | Admitting: Internal Medicine

## 2022-01-23 DIAGNOSIS — L720 Epidermal cyst: Secondary | ICD-10-CM | POA: Diagnosis not present

## 2022-01-26 DIAGNOSIS — Z961 Presence of intraocular lens: Secondary | ICD-10-CM | POA: Diagnosis not present

## 2022-01-26 LAB — HM DIABETES EYE EXAM

## 2022-01-28 DIAGNOSIS — M9901 Segmental and somatic dysfunction of cervical region: Secondary | ICD-10-CM | POA: Diagnosis not present

## 2022-01-28 DIAGNOSIS — M62830 Muscle spasm of back: Secondary | ICD-10-CM | POA: Diagnosis not present

## 2022-01-28 DIAGNOSIS — M5416 Radiculopathy, lumbar region: Secondary | ICD-10-CM | POA: Diagnosis not present

## 2022-01-28 DIAGNOSIS — M9903 Segmental and somatic dysfunction of lumbar region: Secondary | ICD-10-CM | POA: Diagnosis not present

## 2022-01-29 DIAGNOSIS — C50912 Malignant neoplasm of unspecified site of left female breast: Secondary | ICD-10-CM | POA: Diagnosis not present

## 2022-01-29 DIAGNOSIS — C50911 Malignant neoplasm of unspecified site of right female breast: Secondary | ICD-10-CM | POA: Diagnosis not present

## 2022-01-30 DIAGNOSIS — Z96659 Presence of unspecified artificial knee joint: Secondary | ICD-10-CM | POA: Diagnosis not present

## 2022-02-11 DIAGNOSIS — M5416 Radiculopathy, lumbar region: Secondary | ICD-10-CM | POA: Diagnosis not present

## 2022-02-11 DIAGNOSIS — M62830 Muscle spasm of back: Secondary | ICD-10-CM | POA: Diagnosis not present

## 2022-02-11 DIAGNOSIS — M9901 Segmental and somatic dysfunction of cervical region: Secondary | ICD-10-CM | POA: Diagnosis not present

## 2022-02-11 DIAGNOSIS — M9903 Segmental and somatic dysfunction of lumbar region: Secondary | ICD-10-CM | POA: Diagnosis not present

## 2022-02-20 DIAGNOSIS — Z96643 Presence of artificial hip joint, bilateral: Secondary | ICD-10-CM | POA: Diagnosis not present

## 2022-02-27 DIAGNOSIS — M25511 Pain in right shoulder: Secondary | ICD-10-CM | POA: Diagnosis not present

## 2022-02-27 DIAGNOSIS — M25512 Pain in left shoulder: Secondary | ICD-10-CM | POA: Diagnosis not present

## 2022-03-03 DIAGNOSIS — M9903 Segmental and somatic dysfunction of lumbar region: Secondary | ICD-10-CM | POA: Diagnosis not present

## 2022-03-03 DIAGNOSIS — M5416 Radiculopathy, lumbar region: Secondary | ICD-10-CM | POA: Diagnosis not present

## 2022-03-03 DIAGNOSIS — M62830 Muscle spasm of back: Secondary | ICD-10-CM | POA: Diagnosis not present

## 2022-03-03 DIAGNOSIS — M9901 Segmental and somatic dysfunction of cervical region: Secondary | ICD-10-CM | POA: Diagnosis not present

## 2022-03-24 DIAGNOSIS — M62830 Muscle spasm of back: Secondary | ICD-10-CM | POA: Diagnosis not present

## 2022-03-24 DIAGNOSIS — M5416 Radiculopathy, lumbar region: Secondary | ICD-10-CM | POA: Diagnosis not present

## 2022-03-24 DIAGNOSIS — M9901 Segmental and somatic dysfunction of cervical region: Secondary | ICD-10-CM | POA: Diagnosis not present

## 2022-03-24 DIAGNOSIS — M9903 Segmental and somatic dysfunction of lumbar region: Secondary | ICD-10-CM | POA: Diagnosis not present

## 2022-03-24 IMAGING — RF DG HIP (WITH OR WITHOUT PELVIS) 1V*L*
1 series · 2 of 2 positions shown · non-contrast
Comparison: Hip radiographs 07/18/2020.

FLUOROSCOPY:
Fluoroscopy time: 10 seconds

CLINICAL DATA: Left hip replacement.

EXAM:
DG HIP (WITH OR WITHOUT PELVIS) 1V*L*

[Series 1: unknown protocol · 0.20mm/px · 2 of 2 slices shown]
[im 1/2]
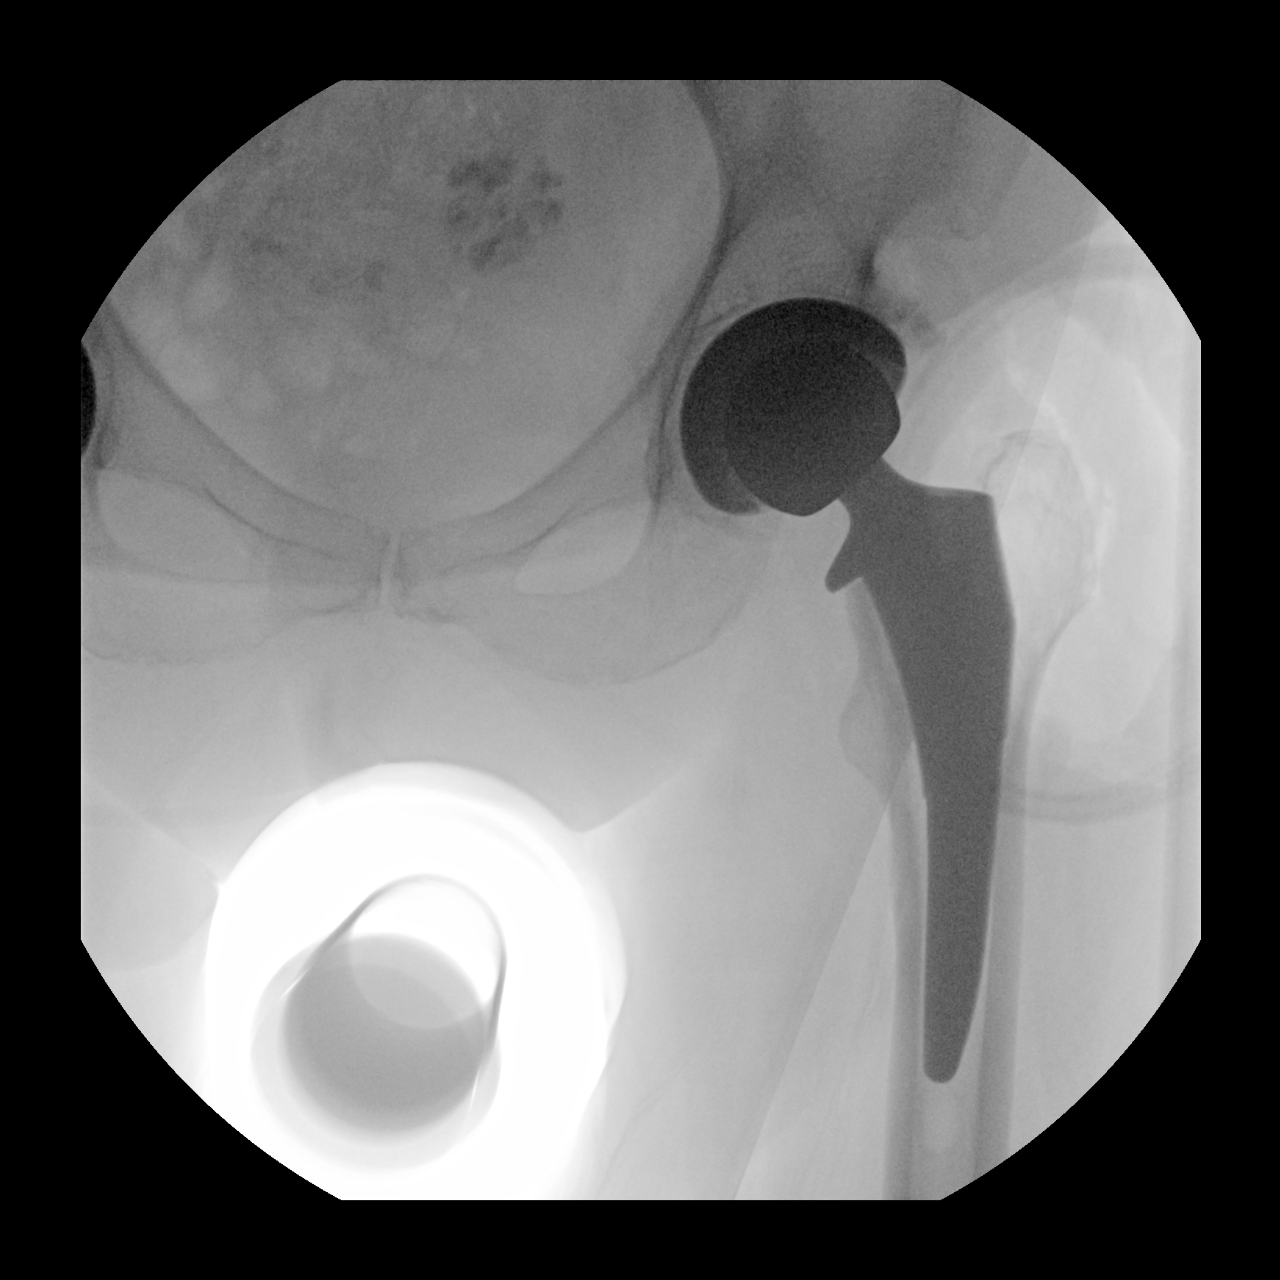
[im 2/2]
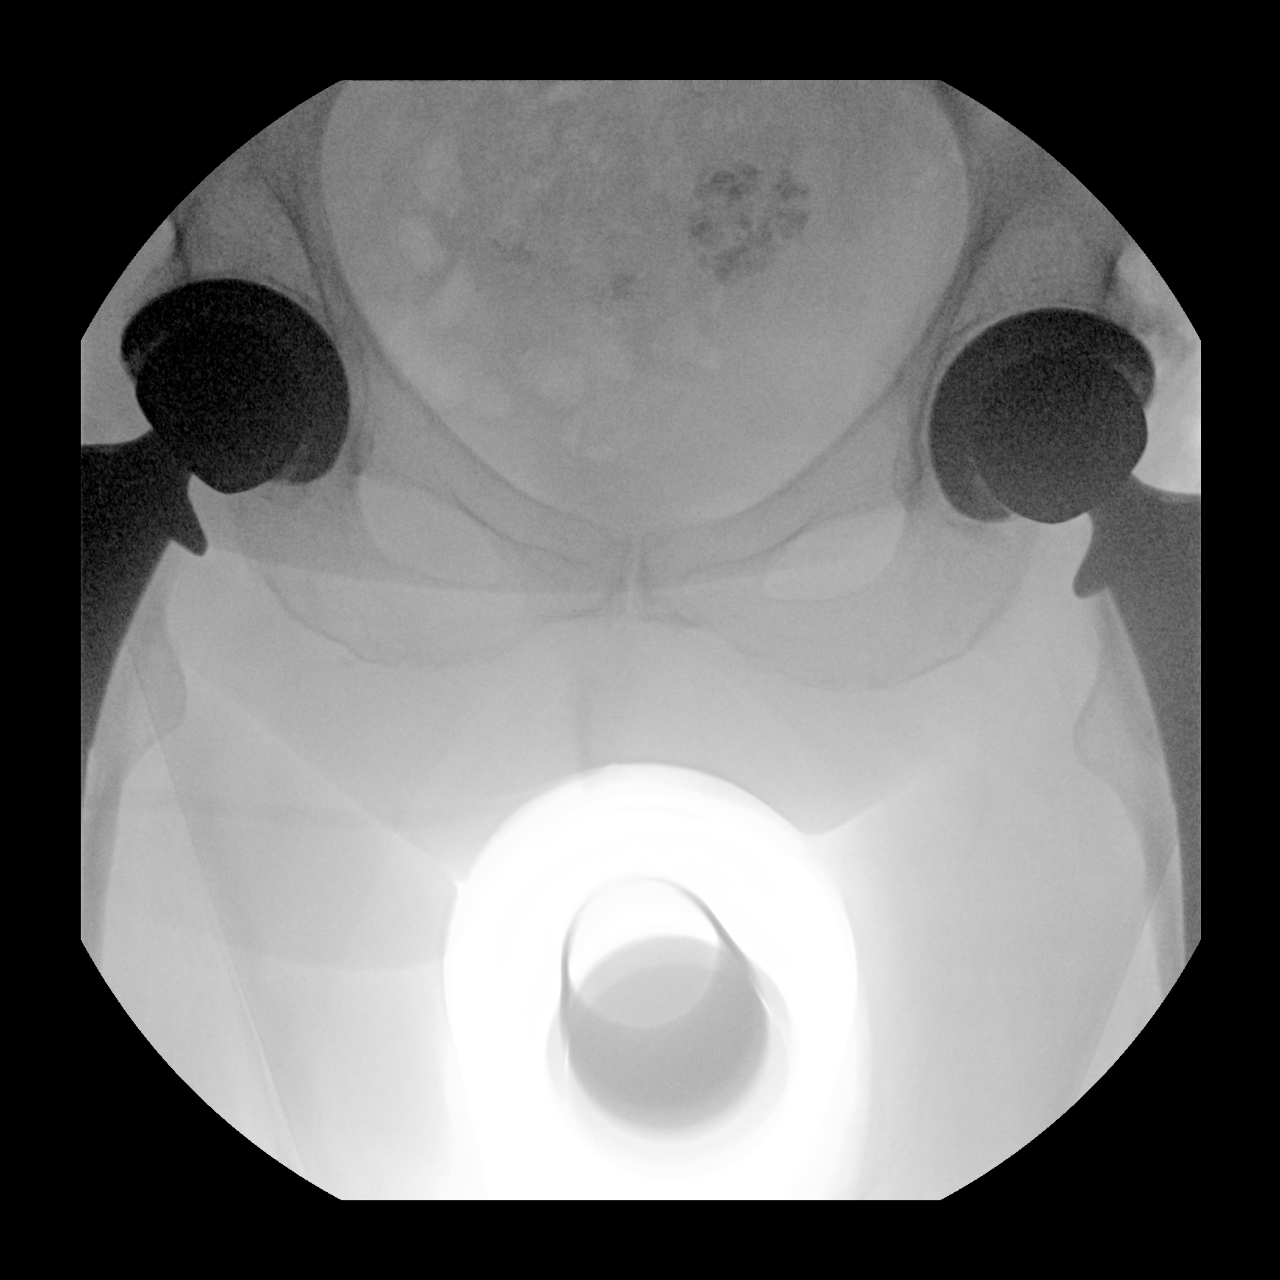

[2 of 2 positions shown; findings below may reference images not displayed]

FINDINGS: Two intraoperative fluoroscopic images of the pelvis are provided
and demonstrate bilateral hip arthroplasties. No acute complication
is evident on these limited images. Coarse calcification in the
pelvis suggests a fibroid, also present in 5357.
IMPRESSION: Intraoperative images as above.

## 2022-04-03 ENCOUNTER — Encounter: Payer: Self-pay | Admitting: Internal Medicine

## 2022-04-21 ENCOUNTER — Ambulatory Visit: Payer: PPO

## 2022-04-21 ENCOUNTER — Ambulatory Visit (INDEPENDENT_AMBULATORY_CARE_PROVIDER_SITE_OTHER): Payer: PPO | Admitting: Podiatry

## 2022-04-21 DIAGNOSIS — M9901 Segmental and somatic dysfunction of cervical region: Secondary | ICD-10-CM | POA: Diagnosis not present

## 2022-04-21 DIAGNOSIS — M7752 Other enthesopathy of left foot: Secondary | ICD-10-CM | POA: Diagnosis not present

## 2022-04-21 DIAGNOSIS — M62830 Muscle spasm of back: Secondary | ICD-10-CM | POA: Diagnosis not present

## 2022-04-21 DIAGNOSIS — M5416 Radiculopathy, lumbar region: Secondary | ICD-10-CM | POA: Diagnosis not present

## 2022-04-21 DIAGNOSIS — M9903 Segmental and somatic dysfunction of lumbar region: Secondary | ICD-10-CM | POA: Diagnosis not present

## 2022-04-21 DIAGNOSIS — M779 Enthesopathy, unspecified: Secondary | ICD-10-CM

## 2022-04-21 NOTE — Progress Notes (Signed)
Chief Complaint  Patient presents with   Ankle Pain    Patient is here for left ankle pain.    Subjective:  81 y.o. female presenting today for new complaint of pain to the left ankle joint.  Patient states that in June last month she wore a pair of sandals to the pool and developed left ankle pain ever since.  She denies history of injury.  She has not done anything for treatment currently.  She does state that over the past month there has been some improvement of the ankle joint.  She presents for further treatment and evaluation   Past Medical History:  Diagnosis Date   Arthritis    breast cancer 2005   bilateral   Cataract 01/04/13 and 02/27/82   Complication of anesthesia    dizziness    Hyperlipidemia    hypothyroidism    Hypothyroidism    Personal history of chemotherapy    Personal history of radiation therapy    PONV (postoperative nausea and vomiting)    Pre-diabetes    Sleep apnea    mild diagnosis no cpap   Tinnitus    Past Surgical History:  Procedure Laterality Date   bilateral knee replacement s     BREAST BIOPSY Left 2005   positive   BREAST BIOPSY Right 2005   positive   BREAST LUMPECTOMY Left 2005   BREAST LUMPECTOMY Right 2005   gumm surgery      JOINT REPLACEMENT  2013   by Dr. Marry Guan   righ tknee arthroscopy      TONSILLECTOMY     TOTAL HIP ARTHROPLASTY Right 11/12/2020   Procedure: RIGHT TOTAL HIP ARTHROPLASTY ANTERIOR APPROACH;  Surgeon: Melrose Nakayama, MD;  Location: [facility];  Service: Orthopedics;  Laterality: Right;   TOTAL HIP ARTHROPLASTY Left 11/25/2021   Procedure: LEFT TOTAL HIP ARTHROPLASTY ANTERIOR APPROACH;  Surgeon: Melrose Nakayama, MD;  Location: [facility];  Service: Orthopedics;  Laterality: Left;   Allergies  Allergen Reactions   Codeine Shortness Of Breath and Other (See Comments)    Altered mental staus   Ilevro [Nepafenac] Other (See Comments)    Eye swelling Eye drops    Lamisil [Terbinafine] Rash and Other (See Comments)     Dysphagia and skin fungus   Other Swelling    DOGS.  Congestion. FEATHERS.  Congestion  Can not take preservative free eye drops     Maxitrol [Neomycin-Polymyxin-Dexameth]     dry eyes    Metformin And Related     Shakiness    Augmentin [Amoxicillin-Pot Clavulanate] Itching and Rash   Molds & Smuts Other (See Comments)    Congestion.   Neomycin Rash   Neomycin-Bacitracin Zn-Polymyx Rash   Neomycin-Polymyxin-Hc Rash   Tape Dermatitis    Blisters   Tapentadol Nausea And Vomiting    Nucynta   Terbinafine Rash    Trouble swallowing   Tramadol Other (See Comments), Nausea Only and Nausea And Vomiting    dizziness     Objective / Physical Exam:  General:  The patient is alert and oriented x3 in no acute distress. Dermatology:  Skin is warm, dry and supple bilateral lower extremities. Negative for open lesions or macerations. Vascular:  Palpable pedal pulses bilaterally. No edema or erythema noted. Capillary refill within normal limits. Neurological:  Epicritic and protective threshold grossly intact bilaterally.  Musculoskeletal Exam:  Tenderness on palpation to the anterior lateral medial aspects of the patient's left ankle. Mild edema noted. Range of motion within  normal limits to all pedal and ankle joints bilateral. Muscle strength 5/5 in all groups bilateral.   Radiographic Exam LT ankle 04/21/2022:  Normal osseous mineralization. Joint spaces preserved.  There may be some slight joint space narrowing to the medial gutter of the tibiotalar joint. No fracture/dislocation/boney destruction.    Assessment: 1.  Capsulitis left ankle  Plan of Care:  1. Patient was evaluated. X-Rays reviewed.  2.  Patient declined cortisone injection.  She says that she received an injection in her right shoulder recently and elevated her sugar levels.  She tries to maintain her diabetes with diet alone. 3.  Patient takes OTC Motrin as needed.  Continue 4.  Compression ankle sleeve  dispensed.  Wear daily 5.  Continue wearing good supportive Brooks running shoes 6.  Return to clinic as needed   Edrick Kins, DPM [facility]  Dr. Edrick Kins, DPM    [address], [place] [address]                Office [phone]  Fax [phone]

## 2022-04-22 ENCOUNTER — Encounter: Payer: Self-pay | Admitting: Podiatry

## 2022-04-27 ENCOUNTER — Other Ambulatory Visit: Payer: Self-pay | Admitting: Internal Medicine

## 2022-04-27 DIAGNOSIS — Z1231 Encounter for screening mammogram for malignant neoplasm of breast: Secondary | ICD-10-CM

## 2022-04-28 ENCOUNTER — Encounter: Payer: Self-pay | Admitting: Internal Medicine

## 2022-04-28 ENCOUNTER — Other Ambulatory Visit: Payer: Self-pay

## 2022-05-01 ENCOUNTER — Other Ambulatory Visit: Payer: Self-pay | Admitting: Internal Medicine

## 2022-05-12 DIAGNOSIS — M9901 Segmental and somatic dysfunction of cervical region: Secondary | ICD-10-CM | POA: Diagnosis not present

## 2022-05-12 DIAGNOSIS — M62830 Muscle spasm of back: Secondary | ICD-10-CM | POA: Diagnosis not present

## 2022-05-12 DIAGNOSIS — M9903 Segmental and somatic dysfunction of lumbar region: Secondary | ICD-10-CM | POA: Diagnosis not present

## 2022-05-12 DIAGNOSIS — M5416 Radiculopathy, lumbar region: Secondary | ICD-10-CM | POA: Diagnosis not present

## 2022-06-02 ENCOUNTER — Ambulatory Visit
Admission: RE | Admit: 2022-06-02 | Discharge: 2022-06-02 | Disposition: A | Discharge status: Home / Self Care | Payer: PPO | Source: Ambulatory Visit | Attending: Internal Medicine | Admitting: Internal Medicine

## 2022-06-02 DIAGNOSIS — Z1231 Encounter for screening mammogram for malignant neoplasm of breast: Secondary | ICD-10-CM | POA: Insufficient documentation

## 2022-06-02 DIAGNOSIS — M25552 Pain in left hip: Secondary | ICD-10-CM | POA: Diagnosis not present

## 2022-06-02 DIAGNOSIS — Z96642 Presence of left artificial hip joint: Secondary | ICD-10-CM | POA: Diagnosis not present

## 2022-06-03 DIAGNOSIS — M25552 Pain in left hip: Secondary | ICD-10-CM | POA: Diagnosis not present

## 2022-06-03 DIAGNOSIS — Z96642 Presence of left artificial hip joint: Secondary | ICD-10-CM | POA: Diagnosis not present

## 2022-06-04 DIAGNOSIS — M5416 Radiculopathy, lumbar region: Secondary | ICD-10-CM | POA: Diagnosis not present

## 2022-06-04 DIAGNOSIS — M9901 Segmental and somatic dysfunction of cervical region: Secondary | ICD-10-CM | POA: Diagnosis not present

## 2022-06-04 DIAGNOSIS — M9903 Segmental and somatic dysfunction of lumbar region: Secondary | ICD-10-CM | POA: Diagnosis not present

## 2022-06-04 DIAGNOSIS — M62830 Muscle spasm of back: Secondary | ICD-10-CM | POA: Diagnosis not present

## 2022-07-02 DIAGNOSIS — D2262 Melanocytic nevi of left upper limb, including shoulder: Secondary | ICD-10-CM | POA: Diagnosis not present

## 2022-07-02 DIAGNOSIS — M9903 Segmental and somatic dysfunction of lumbar region: Secondary | ICD-10-CM | POA: Diagnosis not present

## 2022-07-02 DIAGNOSIS — M9901 Segmental and somatic dysfunction of cervical region: Secondary | ICD-10-CM | POA: Diagnosis not present

## 2022-07-02 DIAGNOSIS — D2272 Melanocytic nevi of left lower limb, including hip: Secondary | ICD-10-CM | POA: Diagnosis not present

## 2022-07-02 DIAGNOSIS — M62830 Muscle spasm of back: Secondary | ICD-10-CM | POA: Diagnosis not present

## 2022-07-02 DIAGNOSIS — R58 Hemorrhage, not elsewhere classified: Secondary | ICD-10-CM | POA: Diagnosis not present

## 2022-07-02 DIAGNOSIS — D2261 Melanocytic nevi of right upper limb, including shoulder: Secondary | ICD-10-CM | POA: Diagnosis not present

## 2022-07-02 DIAGNOSIS — M5416 Radiculopathy, lumbar region: Secondary | ICD-10-CM | POA: Diagnosis not present

## 2022-07-02 DIAGNOSIS — L538 Other specified erythematous conditions: Secondary | ICD-10-CM | POA: Diagnosis not present

## 2022-07-02 DIAGNOSIS — B351 Tinea unguium: Secondary | ICD-10-CM | POA: Diagnosis not present

## 2022-07-02 DIAGNOSIS — L820 Inflamed seborrheic keratosis: Secondary | ICD-10-CM | POA: Diagnosis not present

## 2022-07-02 DIAGNOSIS — L298 Other pruritus: Secondary | ICD-10-CM | POA: Diagnosis not present

## 2022-07-14 ENCOUNTER — Ambulatory Visit (INDEPENDENT_AMBULATORY_CARE_PROVIDER_SITE_OTHER): Payer: PPO

## 2022-07-14 VITALS — Ht 65.0 in | Wt 169.0 lb

## 2022-07-14 DIAGNOSIS — Z0000 Encounter for general adult medical examination without abnormal findings: Secondary | ICD-10-CM | POA: Diagnosis not present

## 2022-07-14 NOTE — Patient Instructions (Addendum)
Jacqueline Mata , Thank you for taking time to come for your Medicare Wellness Visit. I appreciate your ongoing commitment to your health goals. Please review the following plan we discussed and let me know if I can assist you in the future.   These are the goals we discussed:  Goals       Patient Stated     I would like to brush up on speaking the [place] language for brain health (pt-stated)      Increase physical activity (pt-stated)      Walk more for exercise Weight goal 170lb        This is a list of the screening recommended for you and due dates:  Health Maintenance  Topic Date Due   Hemoglobin A1C  08/14/2022*   Complete foot exam   08/28/2022*   Flu Shot  12/27/2022*   Tetanus Vaccine  08/01/2022   Yearly kidney health urinalysis for diabetes  11/06/2022   Yearly kidney function blood test for diabetes  01/09/2023   Eye exam for diabetics  01/27/2023   Mammogram  06/03/2023   Pneumonia Vaccine  Completed   DEXA scan (bone density measurement)  Completed   Zoster (Shingles) Vaccine  Completed   HPV Vaccine  Aged Out   COVID-19 Vaccine  Discontinued  *Topic was postponed. The date shown is not the original due date.    Advanced directives: End of life planning; Advance aging; Advanced directives discussed.  Copy of current HCPOA/Living Will requested.    Conditions/risks identified: none new  Next appointment: Follow up in one year for your annual wellness visit    Preventive Care 65 Years and Older, Female Preventive care refers to lifestyle choices and visits with your health care provider that can promote health and wellness. What does preventive care include? A yearly physical exam. This is also called an annual well check. Dental exams once or twice a year. Routine eye exams. Ask your health care provider how often you should have your eyes checked. Personal lifestyle choices, including: Daily care of your teeth and gums. Regular physical activity. Eating a  healthy diet. Avoiding tobacco and drug use. Limiting alcohol use. Practicing safe sex. Taking low-dose aspirin every day. Taking vitamin and mineral supplements as recommended by your health care provider. What happens during an annual well check? The services and screenings done by your health care provider during your annual well check will depend on your age, overall health, lifestyle risk factors, and family history of disease. Counseling  Your health care provider may ask you questions about your: Alcohol use. Tobacco use. Drug use. Emotional well-being. Home and relationship well-being. Sexual activity. Eating habits. History of falls. Memory and ability to understand (cognition). Work and work Statistician. Reproductive health. Screening  You may have the following tests or measurements: Height, weight, and BMI. Blood pressure. Lipid and cholesterol levels. These may be checked every 5 years, or more frequently if you are over 72 years old. Skin check. Lung cancer screening. You may have this screening every year starting at age 27 if you have a 30-pack-year history of smoking and currently smoke or have quit within the past 15 years. Fecal occult blood test (FOBT) of the stool. You may have this test every year starting at age 33. Flexible sigmoidoscopy or colonoscopy. You may have a sigmoidoscopy every 5 years or a colonoscopy every 10 years starting at age 26. Hepatitis C blood test. Hepatitis B blood test. Sexually transmitted disease (STD) testing. Diabetes  screening. This is done by checking your blood sugar (glucose) after you have not eaten for a while (fasting). You may have this done every 1-3 years. Bone density scan. This is done to screen for osteoporosis. You may have this done starting at age 85. Mammogram. This may be done every 1-2 years. Talk to your health care provider about how often you should have regular mammograms. Talk with your health care  provider about your test results, treatment options, and if necessary, the need for more tests. Vaccines  Your health care provider may recommend certain vaccines, such as: Influenza vaccine. This is recommended every year. Tetanus, diphtheria, and acellular pertussis (Tdap, Td) vaccine. You may need a Td booster every 10 years. Zoster vaccine. You may need this after age 50. Pneumococcal 13-valent conjugate (PCV13) vaccine. One dose is recommended after age 40. Pneumococcal polysaccharide (PPSV23) vaccine. One dose is recommended after age 67. Talk to your health care provider about which screenings and vaccines you need and how often you need them. This information is not intended to replace advice given to you by your health care provider. Make sure you discuss any questions you have with your health care provider. Document Released: 10/11/2015 Document Revised: 06/03/2016 Document Reviewed: 07/16/2015 Elsevier Interactive Patient Education  2017 [place] Prevention in the Home Falls can cause injuries. They can happen to people of all ages. There are many things you can do to make your home safe and to help prevent falls. What can I do on the outside of my home? Regularly fix the edges of walkways and driveways and fix any cracks. Remove anything that might make you trip as you walk through a door, such as a raised step or threshold. Trim any bushes or trees on the path to your home. Use bright outdoor lighting. Clear any walking paths of anything that might make someone trip, such as rocks or tools. Regularly check to see if handrails are loose or broken. Make sure that both sides of any steps have handrails. Any raised decks and porches should have guardrails on the edges. Have any leaves, snow, or ice cleared regularly. Use sand or salt on walking paths during winter. Clean up any spills in your garage right away. This includes oil or grease spills. What can I do in the  bathroom? Use night lights. Install grab bars by the toilet and in the tub and shower. Do not use towel bars as grab bars. Use non-skid mats or decals in the tub or shower. If you need to sit down in the shower, use a plastic, non-slip stool. Keep the floor dry. Clean up any water that spills on the floor as soon as it happens. Remove soap buildup in the tub or shower regularly. Attach bath mats securely with double-sided non-slip rug tape. Do not have throw rugs and other things on the floor that can make you trip. What can I do in the bedroom? Use night lights. Make sure that you have a light by your bed that is easy to reach. Do not use any sheets or blankets that are too big for your bed. They should not hang down onto the floor. Have a firm chair that has side arms. You can use this for support while you get dressed. Do not have throw rugs and other things on the floor that can make you trip. What can I do in the kitchen? Clean up any spills right away. Avoid walking on wet floors. Keep  items that you use a lot in easy-to-reach places. If you need to reach something above you, use a strong step stool that has a grab bar. Keep electrical cords out of the way. Do not use floor polish or wax that makes floors slippery. If you must use wax, use non-skid floor wax. Do not have throw rugs and other things on the floor that can make you trip. What can I do with my stairs? Do not leave any items on the stairs. Make sure that there are handrails on both sides of the stairs and use them. Fix handrails that are broken or loose. Make sure that handrails are as long as the stairways. Check any carpeting to make sure that it is firmly attached to the stairs. Fix any carpet that is loose or worn. Avoid having throw rugs at the top or bottom of the stairs. If you do have throw rugs, attach them to the floor with carpet tape. Make sure that you have a light switch at the top of the stairs and the  bottom of the stairs. If you do not have them, ask someone to add them for you. What else can I do to help prevent falls? Wear shoes that: Do not have high heels. Have rubber bottoms. Are comfortable and fit you well. Are closed at the toe. Do not wear sandals. If you use a stepladder: Make sure that it is fully opened. Do not climb a closed stepladder. Make sure that both sides of the stepladder are locked into place. Ask someone to hold it for you, if possible. Clearly mark and make sure that you can see: Any grab bars or handrails. First and last steps. Where the edge of each step is. Use tools that help you move around (mobility aids) if they are needed. These include: Canes. Walkers. Scooters. Crutches. Turn on the lights when you go into a dark area. Replace any light bulbs as soon as they burn out. Set up your furniture so you have a clear path. Avoid moving your furniture around. If any of your floors are uneven, fix them. If there are any pets around you, be aware of where they are. Review your medicines with your doctor. Some medicines can make you feel dizzy. This can increase your chance of falling. Ask your doctor what other things that you can do to help prevent falls. This information is not intended to replace advice given to you by your health care provider. Make sure you discuss any questions you have with your health care provider. Document Released: 07/11/2009 Document Revised: 02/20/2016 Document Reviewed: 10/19/2014 Elsevier Interactive Patient Education  2017 [employer].

## 2022-07-14 NOTE — Progress Notes (Addendum)
Subjective:   Jacqueline Mata is a 81 y.o. female who presents for Medicare Annual (Subsequent) preventive examination.  Review of Systems    No ROS.  Medicare Wellness Virtual Visit.  Visual/audio telehealth visit, UTA vital signs.   See social history for additional risk factors.   Cardiac Risk Factors include: advanced age (>89mn, >>29women)     Objective:    Today's Vitals   07/14/22 1537  Weight: 169 lb (76.7 kg)  Height: _0  (1.651 m)   Body mass index is 28.12 kg/m.     07/14/2022    4:04 PM 01/08/2022   12:15 PM 11/13/2021   11:02 AM 07/11/2021   11:47 AM 07/01/2021   11:55 AM 04/25/2021   10:18 AM 12/31/2020   11:49 AM  Advanced Directives  Does Patient Have a Medical Advance Directive? Yes Yes Yes Yes No Yes No  Type of Advance Directive Living will Living will;Healthcare Power of [place]  [place]   Does patient want to make changes to medical advance directive? No - Patient declined No - Patient declined       Copy of [place] Chart?  No - copy requested    No - copy requested   Would patient like information on creating a medical advance directive?     No - Patient declined  No - Patient declined    Current Medications (verified) Outpatient Encounter Medications as of 07/14/2022  Medication Sig   amoxicillin (AMOXIL) 500 MG capsule Take 2,000 mg by mouth See admin instructions. Dental procedures. Takes 20093mone hour prior to dental procedures.   blood glucose meter kit and supplies Dispense based on patient and insurance preference. Use to check blood sugars one time daily. (ICD-10 E11.9).   Cholecalciferol (VITAMIN D) 50 MCG (2000 UT) tablet Take 2,000 Units by mouth daily.   COLLAGEN PO Take 1 Scoop by mouth every other day. With Probiotic   fluconazole (DIFLUCAN) 200 MG tablet Take 200 mg by mouth once a week.   folic acid (FOLVITE) 80923CG  tablet Take 800 mcg by mouth daily.   Ivermectin 1 % CREA Apply 1 application  topically every evening.   Lancets (ONETOUCH DELICA PLUS [id number] USE TO CHECK BLOOD SUGAR ONCE DAILY   levothyroxine (SYNTHROID) 88 MCG tablet TAKE 1 TABLET BY MOUTH DAILY BEFORE BREAKFAST   loratadine (CLARITIN) 10 MG tablet Take 10 mg by mouth daily.   ONETOUCH VERIO test strip USE TO CHECK BLOOD SUGAR ONCE DAILY   OVER THE COUNTER MEDICATION Place 2 sprays into the nose daily. Sovereign Silver Nasal Spray (Immune Support)   Propylene Glycol (SYSTANE BALANCE) 0.6 % SOLN Place 1 drop into both eyes daily as needed (dry eyes).   vitamin B-12 (CYANOCOBALAMIN) 1000 MCG tablet Take 1,000 mcg by mouth daily.   No facility-administered encounter medications on file as of 07/14/2022.    Allergies (verified) Codeine, Ilevro [nepafenac], Lamisil [terbinafine], Other, Maxitrol [neomycin-polymyxin-dexameth], Metformin and related, Augmentin [amoxicillin-pot clavulanate], Molds & smuts, Neomycin, Neomycin-bacitracin zn-polymyx, Neomycin-polymyxin-hc, Tape, Tapentadol, Terbinafine, and Tramadol   History: Past Medical History:  Diagnosis Date   Arthritis    breast cancer 2005   bilateral   Cataract 01/04/13 and 6/11/28/52 Complication of anesthesia    dizziness    Hyperlipidemia    hypothyroidism    Hypothyroidism    Personal history of chemotherapy    Personal history of radiation  therapy    PONV (postoperative nausea and vomiting)    Pre-diabetes    Sleep apnea    mild diagnosis no cpap   Tinnitus    Past Surgical History:  Procedure Laterality Date   bilateral knee replacement s     BREAST BIOPSY Left 2005   positive   BREAST BIOPSY Right 2005   positive   BREAST LUMPECTOMY Left 2005   BREAST LUMPECTOMY Right 2005   gumm surgery      JOINT REPLACEMENT  2013   by Dr. Marry Guan   righ tknee arthroscopy      TONSILLECTOMY     TOTAL HIP ARTHROPLASTY Right 11/12/2020   Procedure: RIGHT TOTAL HIP  ARTHROPLASTY ANTERIOR APPROACH;  Surgeon: Melrose Nakayama, MD;  Location: [facility];  Service: Orthopedics;  Laterality: Right;   TOTAL HIP ARTHROPLASTY Left 11/25/2021   Procedure: LEFT TOTAL HIP ARTHROPLASTY ANTERIOR APPROACH;  Surgeon: Melrose Nakayama, MD;  Location: [facility];  Service: Orthopedics;  Laterality: Left;   Family History  Problem Relation Age of Onset   Cancer Mother 70       ovarian   Cancer Sister        lung, former tobacco   Breast cancer Maternal Grandmother    Cancer Brother 3       lung, thyroid, melanoma   Diabetes Son    Social History   Socioeconomic History   Marital status: Divorced    Spouse name: Not on file   Number of children: Not on file   Years of education: Not on file   Highest education level: Not on file  Occupational History   Not on file  Tobacco Use   Smoking status: Former    Packs/day: 1.00    Years: 19.00    Total pack years: 19.00    Types: Cigarettes    Start date: 01/02/1960    Quit date: 07/22/1978    Years since quitting: 44.0   Smokeless tobacco: Never   Tobacco comments:    quit 36 years ago  Vaping Use   Vaping Use: Never used  Substance and Sexual Activity   Alcohol use: Yes    Alcohol/week: 0.0 standard drinks of alcohol    Comment: rare   Drug use: No   Sexual activity: Never  Other Topics Concern   Not on file  Social History Narrative   Not on file   Social Determinants of Health   Financial Resource Strain: Low Risk  (07/14/2022)   Overall Financial Resource Strain (CARDIA)    Difficulty of Paying Living Expenses: Not hard at all  Food Insecurity: No Food Insecurity (07/14/2022)   Hunger Vital Sign    Worried About Running Out of Food in the Last Year: Never true    Ran Out of Food in the Last Year: Never true  Transportation Needs: No Transportation Needs (07/14/2022)   PRAPARE - Hydrologist (Medical): No    Lack of Transportation (Non-Medical): No  Physical Activity:  Sufficiently Active (07/14/2022)   Exercise Vital Sign    Days of Exercise per Week: 3 days    Minutes of Exercise per Session: 120 min  Stress: No Stress Concern Present (07/14/2022)   [place]    Feeling of Stress : Not at all  Social Connections: Unknown (07/14/2022)   Social Connection and Isolation Panel [NHANES]    Frequency of Communication with Friends and Family: More than three times  a week    Frequency of Social Gatherings with Friends and Family: More than three times a week    Attends Religious Services: Not on Advertising copywriter or Organizations: Not on file    Attends Archivist Meetings: Not on file    Marital Status: Not on file    Tobacco Counseling Counseling given: Not Answered Tobacco comments: quit 36 years ago   Clinical Intake:  Pre-visit preparation completed: Yes        Diabetes: No  Nutrition Risk Assessment: Has the patient had any N/V/D within the last 2 months?  No  Does the patient have any non-healing wounds?  No  Has the patient had any unintentional weight loss or weight gain?  No   Financial Strains and Diabetes Management: Are you having any financial strains with the device, your supplies or your medication? No .  Does the patient want to be seen by Chronic Care Management for management of their diabetes?  No  Would the patient like to be referred to a Nutritionist or for Diabetic Management?  No   Diabetic follow up due. Patient agrees to call the office and schedule diabetic follow up. Foot exam, A1c due.     How often do you need to have someone help you when you read instructions, pamphlets, or other written materials from your doctor or pharmacy?: 1 - Never   Interpreter Needed?: No      Activities of Daily Living    07/14/2022    3:43 PM 11/13/2021   11:04 AM  In your present state of health, do you have any difficulty  performing the following activities:  Hearing? 0   Comment Followed by Audiology   Vision? 0   Difficulty concentrating or making decisions? 0   Walking or climbing stairs? 0   Dressing or bathing? 0   Doing errands, shopping? 0 0  Preparing Food and eating ? N   Using the Toilet? N   In the past six months, have you accidently leaked urine? Y   Comment Managed with daily pad   Do you have problems with loss of bowel control? N   Managing your Medications? N   Managing your Finances? N   Housekeeping or managing your Housekeeping? N     Patient Care Team: [name], MD as PCP - General (Internal Medicine)  Indicate any recent Medical Services you may have received from other than [facility] providers in the past year (date may be approximate).     Assessment:   This is a routine wellness examination for [place].  I connected with  [name] on 07/14/22 by a audio enabled telemedicine application and verified that I am speaking with the correct person using two identifiers.  Patient Location: Home  Provider Location: Office/Clinic  I discussed the limitations of evaluation and management by telemedicine. The patient expressed understanding and agreed to proceed.   Hearing/Vision screen Hearing Screening - Comments:: Stable and followed by [facility] C/O some difficulty hearing at times  Does not wear hearing aids Audiology appointment 09/2022.  Vision Screening - Comments:: Followed by [facility], Dr. Wallace Going Wears glasses when reading  No retinopathy  Bilateral cataracts extracted  Dietary issues and exercise activities discussed: Current Exercise Habits: Home exercise routine, Type of exercise: treadmill;strength training/weights;stretching (Arm exercises, leg strengthening), Time (Minutes): > 60, Frequency (Times/Week): 3, Weekly Exercise (Minutes/Week): 0, Intensity: Mild Low cholesterol diet Good water intake  Goals Addressed                This Visit's Progress     Patient Stated     I would like to brush up on speaking the [place] language for brain health (pt-stated)   On track     Increase physical activity (pt-stated)   On track     Walk more for exercise Weight goal 170lb       Depression Screen    07/14/2022    3:59 PM 11/10/2021    3:47 PM 07/11/2021   11:43 AM 07/10/2021   11:40 AM 02/10/2021    9:36 AM 01/02/2021   11:44 AM 11/04/2020    4:50 PM  PHQ 2/9 Scores  PHQ - 2 Score 0 0 0 0 0 1 0  PHQ- 9 Score      6     Fall Risk    07/14/2022    4:04 PM 11/10/2021    3:47 PM 07/11/2021   11:43 AM 07/10/2021   11:40 AM 05/06/2021    2:16 PM  [place] in the past year? 0 0 0 0 0  Number falls in past yr: 0  0 0   Injury with Fall? 0   0   Risk for fall due to :  No Fall Risks  No Fall Risks   Follow up _0     FALL RISK PREVENTION PERTAINING TO THE HOME: Home free of loose throw rugs in walkways, pet beds, electrical cords, etc? Yes  Adequate lighting in your home to reduce risk of falls? Yes   ASSISTIVE DEVICES UTILIZED TO PREVENT FALLS: Life alert? No  Use of a cane, walker or w/c? No  Grab bars in the bathroom? Yes  Shower chair or bench in shower? Yes  Comfort chair height toilet? Yes   TIMED UP AND GO: Was the test performed? No .    Cognitive Function:    12/09/2015    1:34 PM  MMSE - Mini Mental State Exam  Orientation to time 5  Orientation to Place 5  Registration 3  Attention/ Calculation 5  Recall 3  Language- name 2 objects 2  Language- repeat 1  Language- follow 3 step command 3  Language- read & follow direction 1  Write a sentence 1  Copy design 1  Total score 30        07/14/2022    4:09 PM 07/10/2020   11:26 AM 06/30/2019   11:26 AM  6CIT Screen  What Year? 0 points 0 points 0 points  What month? 0 points 0 points 0  points  What time? 0 points  0 points  Count back from 20 0 points  0 points  Months in reverse 0 points 0 points 0 points  Repeat phrase 0 points  0 points  Total Score 0 points  0 points    Immunizations Immunization History  Administered Date(s) Administered   Fluad Quad(high Dose 65+) 07/10/2021   Influenza, High Dose Seasonal PF 07/01/2017, 06/30/2018, 06/19/2019   Influenza,inj,Quad PF,6+ Mos 07/28/2013, 07/18/2014   Influenza-Unspecified 07/04/2015, 07/30/2016, 06/25/2020   PFIZER(Purple Top)SARS-COV-2 Vaccination 10/05/2019, 10/26/2019, 07/05/2020, 01/10/2021   Pfizer Covid-19 Vaccine Bivalent Booster 70yr & up 07/14/2021   Pneumococcal Conjugate-13 05/21/2014   Pneumococcal Polysaccharide-23 09/29/2003, 02/12/2012   Tdap 08/01/2012   Zoster Recombinat (Shingrix) 07/26/2018, 12/02/2018, 06/22/2019   Zoster, Live 09/28/2009  Screening Tests Health Maintenance  Topic Date Due   HEMOGLOBIN A1C  08/14/2022 (Originally 05/06/2022)   FOOT EXAM  08/28/2022 (Originally 05/06/2022)   INFLUENZA VACCINE  12/27/2022 (Originally 04/28/2022)   TETANUS/TDAP  08/01/2022   Diabetic kidney evaluation - Urine ACR  11/06/2022   Diabetic kidney evaluation - GFR measurement  01/09/2023   OPHTHALMOLOGY EXAM  01/27/2023   MAMMOGRAM  06/03/2023   Pneumonia Vaccine 33+ Years old  Completed   DEXA SCAN  Completed   Zoster Vaccines- Shingrix  Completed   HPV VACCINES  Aged Out   COVID-19 Vaccine  Discontinued    Health Maintenance There are no preventive care reminders to display for this patient.  Lung Cancer Screening: (Low Dose CT Chest recommended if Age 54-80 years, 30 pack-year currently smoking OR have quit w/in 15years.) does not qualify.   Hepatitis C Screening: does not qualify.  Vision Screening: Recommended annual ophthalmology exams for early detection of glaucoma and other disorders of the eye.  Dental Screening: Recommended annual dental exams for proper oral  hygiene  Community Resource Referral / Chronic Care Management: CRR required this visit?  No   CCM required this visit?  No      Plan:     I have personally reviewed and noted the following in the patient's chart:   Medical and social history Use of alcohol, tobacco or illicit drugs  Current medications and supplements including opioid prescriptions. Patient is not currently taking opioid prescriptions. Functional ability and status Nutritional status Physical activity Advanced directives List of other physicians Hospitalizations, surgeries, and ER visits in previous 12 months Vitals Screenings to include cognitive, depression, and falls Referrals and appointments  In addition, I have reviewed and discussed with patient certain preventive protocols, quality metrics, and best practice recommendations. A written personalized care plan for preventive services as well as general preventive health recommendations were provided to patient.     [place], LPN   [id number]    I have reviewed the above information and agree with above.   Deborra Medina, MD

## 2022-07-24 ENCOUNTER — Other Ambulatory Visit: Payer: Self-pay | Admitting: Family

## 2022-07-30 ENCOUNTER — Encounter: Payer: Self-pay | Admitting: Internal Medicine

## 2022-07-30 DIAGNOSIS — M9901 Segmental and somatic dysfunction of cervical region: Secondary | ICD-10-CM | POA: Diagnosis not present

## 2022-07-30 DIAGNOSIS — M9903 Segmental and somatic dysfunction of lumbar region: Secondary | ICD-10-CM | POA: Diagnosis not present

## 2022-07-30 DIAGNOSIS — M62830 Muscle spasm of back: Secondary | ICD-10-CM | POA: Diagnosis not present

## 2022-07-30 DIAGNOSIS — M5416 Radiculopathy, lumbar region: Secondary | ICD-10-CM | POA: Diagnosis not present

## 2022-07-30 MED ORDER — ONETOUCH VERIO VI STRP: ORAL_STRIP | 3 refills | Status: DC

## 2022-08-07 ENCOUNTER — Other Ambulatory Visit: Payer: Self-pay

## 2022-08-07 ENCOUNTER — Inpatient Hospital Stay (HOSPITAL_BASED_OUTPATIENT_CLINIC_OR_DEPARTMENT_OTHER): Payer: PPO | Admitting: Hematology & Oncology

## 2022-08-07 ENCOUNTER — Inpatient Hospital Stay: Payer: PPO | Attending: Hematology & Oncology

## 2022-08-07 ENCOUNTER — Encounter: Payer: Self-pay | Admitting: Hematology & Oncology

## 2022-08-07 VITALS — BP 146/85 | HR 85 | Temp 98.3°F | Resp 19 | Wt 175.0 lb

## 2022-08-07 DIAGNOSIS — Z170 Estrogen receptor positive status [ER+]: Secondary | ICD-10-CM

## 2022-08-07 DIAGNOSIS — Z853 Personal history of malignant neoplasm of breast: Secondary | ICD-10-CM | POA: Diagnosis not present

## 2022-08-07 DIAGNOSIS — C50919 Malignant neoplasm of unspecified site of unspecified female breast: Secondary | ICD-10-CM

## 2022-08-07 LAB — CBC WITH DIFFERENTIAL (CANCER CENTER ONLY)
Abs Immature Granulocytes: 0.06 10*3/uL (ref 0.00–0.07)
Basophils Absolute: 0.1 10*3/uL (ref 0.0–0.1)
Basophils Relative: 1 %
Eosinophils Absolute: 0.3 10*3/uL (ref 0.0–0.5)
Eosinophils Relative: 4 %
HCT: 43.5 % (ref 36.0–46.0)
Hemoglobin: 14.9 g/dL (ref 12.0–15.0)
Immature Granulocytes: 1 %
Lymphocytes Relative: 24 %
Lymphs Abs: 2 10*3/uL (ref 0.7–4.0)
MCH: 30.8 pg (ref 26.0–34.0)
MCHC: 34.3 g/dL (ref 30.0–36.0)
MCV: 89.9 fL (ref 80.0–100.0)
Monocytes Absolute: 0.5 10*3/uL (ref 0.1–1.0)
Monocytes Relative: 6 %
Neutro Abs: 5.3 10*3/uL (ref 1.7–7.7)
Neutrophils Relative %: 64 %
Platelet Count: 188 10*3/uL (ref 150–400)
RBC: 4.84 MIL/uL (ref 3.87–5.11)
RDW: 12.9 % (ref 11.5–15.5)
WBC Count: 8.2 10*3/uL (ref 4.0–10.5)
nRBC: 0 % (ref 0.0–0.2)

## 2022-08-07 LAB — CMP (CANCER CENTER ONLY)
ALT: 22 U/L (ref 0–44)
AST: 20 U/L (ref 15–41)
Albumin: 4.7 g/dL (ref 3.5–5.0)
Alkaline Phosphatase: 60 U/L (ref 38–126)
Anion gap: 10 (ref 5–15)
BUN: 15 mg/dL (ref 8–23)
CO2: 25 mmol/L (ref 22–32)
Calcium: 10.2 mg/dL (ref 8.9–10.3)
Chloride: 103 mmol/L (ref 98–111)
Creatinine: 1.01 mg/dL — ABNORMAL HIGH (ref 0.44–1.00)
GFR, Estimated: 56 mL/min — ABNORMAL LOW (ref >60)
Glucose, Bld: 141 mg/dL — ABNORMAL HIGH (ref 70–99)
Potassium: 4.1 mmol/L (ref 3.5–5.1)
Sodium: 138 mmol/L (ref 135–145)
Total Bilirubin: 0.9 mg/dL (ref 0.3–1.2)
Total Protein: 7.8 g/dL (ref 6.5–8.1)

## 2022-08-07 LAB — LACTATE DEHYDROGENASE: LDH: 179 U/L (ref 98–192)

## 2022-08-07 NOTE — Progress Notes (Signed)
Hematology and Oncology Follow Up Visit  Jacqueline Mata [id number] 1941-06-26 81 y.o. 08/07/2022   Principle Diagnosis:  Synchronous bilateral stage IIA (T2 N0 M0) ductal carcinoma of bilateral breast - ER+/HER2-.  Current Therapy:   Observation     Interim History:  Ms.  Mata is comes in for followup.  She is doing quite well.  She really has had no complaints.  She has recovered quite well from the hip surgery.  She had left hip surgery back in February.  She has had no problems with nausea or vomiting.  There is been no change in bowel or bladder habits.  She has had no rashes.  There is been no leg swelling.  She has had no change in medications.  Overall, I would say that her performance status is ECOG 1.    Medications:  Current Outpatient Medications:    amoxicillin (AMOXIL) 500 MG capsule, Take 2,000 mg by mouth See admin instructions. Dental procedures. Takes 2022m one hour prior to dental procedures., Disp: , Rfl:    Azelaic Acid 15 % FOAM, Apply topically., Disp: , Rfl:    blood glucose meter kit and supplies, Dispense based on patient and insurance preference. Use to check blood sugars one time daily. (ICD-10 E11.9)., Disp: 1 each, Rfl: 0   Cholecalciferol (VITAMIN D) 50 MCG (2000 UT) tablet, Take 2,000 Units by mouth daily., Disp: , Rfl:    COLLAGEN PO, Take 1 Scoop by mouth every other day. With Probiotic, Disp: , Rfl:    fluconazole (DIFLUCAN) 200 MG tablet, Take 200 mg by mouth once a week., Disp: , Rfl:    folic acid (FOLVITE) 8151MCG tablet, Take 800 mcg by mouth daily., Disp: , Rfl:    glucose blood (ONETOUCH VERIO) test strip, Use to check blood sugar once daily., Disp: 100 strip, Rfl: 3   Ivermectin 1 % CREA, Apply 1 application  topically every evening., Disp: , Rfl:    Lancets (ONETOUCH DELICA PLUS [id number] MISC, USE TO CHECK BLOOD SUGAR ONCE DAILY, Disp: 100 each, Rfl: 0   levothyroxine (SYNTHROID) 88 MCG tablet, TAKE 1 TABLET BY MOUTH DAILY BEFORE  BREAKFAST, Disp: 90 tablet, Rfl: 1   loratadine (CLARITIN) 10 MG tablet, Take 10 mg by mouth daily., Disp: , Rfl:    OVER THE COUNTER MEDICATION, Place 2 sprays into the nose daily. Sovereign Silver Nasal Spray (Immune Support), Disp: , Rfl:    Propylene Glycol (SYSTANE BALANCE) 0.6 % SOLN, Place 1 drop into both eyes daily as needed (dry eyes)., Disp: , Rfl:    vitamin B-12 (CYANOCOBALAMIN) 1000 MCG tablet, Take 1,000 mcg by mouth daily., Disp: , Rfl:   Allergies:  Allergies  Allergen Reactions   Codeine Shortness Of Breath and Other (See Comments)    Altered mental staus   Ilevro [Nepafenac] Other (See Comments)    Eye swelling Eye drops    Lamisil [Terbinafine] Rash and Other (See Comments)    Dysphagia and skin fungus   Other Swelling    DOGS.  Congestion. FEATHERS.  Congestion  Can not take preservative free eye drops     Maxitrol [Neomycin-Polymyxin-Dexameth]     dry eyes    Metformin And Related     Shakiness    Augmentin [Amoxicillin-Pot Clavulanate] Itching and Rash   Molds & Smuts Other (See Comments)    Congestion.   Neomycin Rash   Neomycin-Bacitracin Zn-Polymyx Rash   Neomycin-Polymyxin-Hc Rash   Tape Dermatitis    Blisters   Tapentadol  Nausea And Vomiting    Nucynta   Terbinafine Rash    Trouble swallowing   Tramadol Other (See Comments), Nausea Only and Nausea And Vomiting    dizziness    Past Medical History, Surgical history, Social history, and Family History were reviewed and updated.  Review of Systems: Review of Systems  Constitutional: Negative.   HENT: Negative.    Eyes: Negative.   Respiratory: Negative.    Cardiovascular: Negative.   Gastrointestinal: Negative.   Genitourinary: Negative.   Musculoskeletal: Negative.   Skin: Negative.   Neurological: Negative.   Endo/Heme/Allergies: Negative.   Psychiatric/Behavioral: Negative.       Physical Exam:  weight is 175 lb (79.4 kg). Her oral temperature is 98.3 F (36.8 C). Her blood  pressure is 146/85 (abnormal) and her pulse is 85. Her respiration is 19 and oxygen saturation is 96%.   Physical Exam Vitals reviewed.  HENT:     Head: Normocephalic and atraumatic.  Eyes:     Pupils: Pupils are equal, round, and reactive to light.  Cardiovascular:     Rate and Rhythm: Normal rate and regular rhythm.     Heart sounds: Normal heart sounds.  Pulmonary:     Effort: Pulmonary effort is normal.     Breath sounds: Normal breath sounds.  Abdominal:     General: Bowel sounds are normal.     Palpations: Abdomen is soft.  Musculoskeletal:        General: No tenderness or deformity. Normal range of motion.     Cervical back: Normal range of motion.  Lymphadenopathy:     Cervical: No cervical adenopathy.  Skin:    General: Skin is warm and dry.     Findings: No erythema or rash.  Neurological:     Mental Status: She is alert and oriented to person, place, and time.  Psychiatric:        Behavior: Behavior normal.        Thought Content: Thought content normal.        Judgment: Judgment normal.   Lab Results  Component Value Date   WBC 8.2 08/07/2022   HGB 14.9 08/07/2022   HCT 43.5 08/07/2022   MCV 89.9 08/07/2022   PLT 188 08/07/2022     Chemistry      Component Value Date/Time   NA 138 08/07/2022 1000   NA 135 04/29/2017 1157   NA 138 10/29/2016 1021   K 4.1 08/07/2022 1000   K 3.7 04/29/2017 1157   K 4.2 10/29/2016 1021   CL 103 08/07/2022 1000   CL 102 04/29/2017 1157   CO2 25 08/07/2022 1000   CO2 27 04/29/2017 1157   CO2 24 10/29/2016 1021   BUN 15 08/07/2022 1000   BUN 9 04/29/2017 1157   BUN 17.3 10/29/2016 1021   CREATININE 1.01 (H) 08/07/2022 1000   CREATININE 0.9 04/29/2017 1157   CREATININE 0.9 10/29/2016 1021      Component Value Date/Time   CALCIUM 10.2 08/07/2022 1000   CALCIUM 9.2 04/29/2017 1157   CALCIUM 10.0 10/29/2016 1021   ALKPHOS 60 08/07/2022 1000   ALKPHOS 64 04/29/2017 1157   ALKPHOS 71 10/29/2016 1021   AST 20  08/07/2022 1000   AST 24 10/29/2016 1021   ALT 22 08/07/2022 1000   ALT 37 04/29/2017 1157   ALT 26 10/29/2016 1021   BILITOT 0.9 08/07/2022 1000   BILITOT 1.18 10/29/2016 1021       Impression and Plan: Jacqueline Mata is  81 year old white female with history of synchronous bilateral breast cancer. Fortunately both breast cancers were node negative. They were ER positive and HER-2 negative.   It has now been close to 18 years since she was treated for the breast cancer.  I really have to believe that she is going to be cured since her cancers were node negative.  I am just so glad that she is doing well.  Her quality of life is doing so well right now.  I am not surprised.  She is always had a good attitude.  She goes to [employer] 3 times a week to exercise.  We will plan to get her back in 6 months.     Volanda Napoleon, MD 11/10/202311:03 AM

## 2022-08-27 DIAGNOSIS — M5416 Radiculopathy, lumbar region: Secondary | ICD-10-CM | POA: Diagnosis not present

## 2022-08-27 DIAGNOSIS — M9901 Segmental and somatic dysfunction of cervical region: Secondary | ICD-10-CM | POA: Diagnosis not present

## 2022-08-27 DIAGNOSIS — M62830 Muscle spasm of back: Secondary | ICD-10-CM | POA: Diagnosis not present

## 2022-08-27 DIAGNOSIS — M9903 Segmental and somatic dysfunction of lumbar region: Secondary | ICD-10-CM | POA: Diagnosis not present

## 2022-09-14 DIAGNOSIS — M62830 Muscle spasm of back: Secondary | ICD-10-CM | POA: Diagnosis not present

## 2022-09-14 DIAGNOSIS — M542 Cervicalgia: Secondary | ICD-10-CM | POA: Diagnosis not present

## 2022-09-14 DIAGNOSIS — M546 Pain in thoracic spine: Secondary | ICD-10-CM | POA: Diagnosis not present

## 2022-09-14 DIAGNOSIS — M9903 Segmental and somatic dysfunction of lumbar region: Secondary | ICD-10-CM | POA: Diagnosis not present

## 2022-09-17 DIAGNOSIS — M546 Pain in thoracic spine: Secondary | ICD-10-CM | POA: Diagnosis not present

## 2022-09-17 DIAGNOSIS — M62830 Muscle spasm of back: Secondary | ICD-10-CM | POA: Diagnosis not present

## 2022-09-17 DIAGNOSIS — M9903 Segmental and somatic dysfunction of lumbar region: Secondary | ICD-10-CM | POA: Diagnosis not present

## 2022-09-17 DIAGNOSIS — M542 Cervicalgia: Secondary | ICD-10-CM | POA: Diagnosis not present

## 2022-09-24 DIAGNOSIS — M546 Pain in thoracic spine: Secondary | ICD-10-CM | POA: Diagnosis not present

## 2022-09-24 DIAGNOSIS — M9903 Segmental and somatic dysfunction of lumbar region: Secondary | ICD-10-CM | POA: Diagnosis not present

## 2022-09-24 DIAGNOSIS — M62830 Muscle spasm of back: Secondary | ICD-10-CM | POA: Diagnosis not present

## 2022-09-24 DIAGNOSIS — M542 Cervicalgia: Secondary | ICD-10-CM | POA: Diagnosis not present

## 2022-10-08 DIAGNOSIS — M9903 Segmental and somatic dysfunction of lumbar region: Secondary | ICD-10-CM | POA: Diagnosis not present

## 2022-10-08 DIAGNOSIS — M542 Cervicalgia: Secondary | ICD-10-CM | POA: Diagnosis not present

## 2022-10-08 DIAGNOSIS — M62830 Muscle spasm of back: Secondary | ICD-10-CM | POA: Diagnosis not present

## 2022-10-08 DIAGNOSIS — M546 Pain in thoracic spine: Secondary | ICD-10-CM | POA: Diagnosis not present

## 2022-10-20 DIAGNOSIS — H903 Sensorineural hearing loss, bilateral: Secondary | ICD-10-CM | POA: Diagnosis not present

## 2022-10-21 ENCOUNTER — Ambulatory Visit (INDEPENDENT_AMBULATORY_CARE_PROVIDER_SITE_OTHER): Payer: PPO | Admitting: Internal Medicine

## 2022-10-21 ENCOUNTER — Encounter: Payer: Self-pay | Admitting: Internal Medicine

## 2022-10-21 VITALS — BP 118/60 | HR 114 | Temp 97.9°F | Ht 65.0 in | Wt 174.0 lb

## 2022-10-21 DIAGNOSIS — E119 Type 2 diabetes mellitus without complications: Secondary | ICD-10-CM

## 2022-10-21 DIAGNOSIS — E1169 Type 2 diabetes mellitus with other specified complication: Secondary | ICD-10-CM

## 2022-10-21 DIAGNOSIS — E039 Hypothyroidism, unspecified: Secondary | ICD-10-CM | POA: Diagnosis not present

## 2022-10-21 DIAGNOSIS — E785 Hyperlipidemia, unspecified: Secondary | ICD-10-CM

## 2022-10-21 DIAGNOSIS — H903 Sensorineural hearing loss, bilateral: Secondary | ICD-10-CM

## 2022-10-21 DIAGNOSIS — B351 Tinea unguium: Secondary | ICD-10-CM | POA: Diagnosis not present

## 2022-10-21 DIAGNOSIS — Z79899 Other long term (current) drug therapy: Secondary | ICD-10-CM

## 2022-10-21 MED ORDER — ONETOUCH VERIO VI STRP: ORAL_STRIP | 12 refills | Status: DC

## 2022-10-21 MED ORDER — ONETOUCH DELICA PLUS LANCET33G MISC: 3 refills | Status: DC

## 2022-10-21 NOTE — Assessment & Plan Note (Signed)
Encouraged to start flucona zole and return for lfts in 6 weeks

## 2022-10-21 NOTE — Assessment & Plan Note (Signed)
Statin intolerant.  Fastings have been elevated .  Will return

## 2022-10-21 NOTE — Progress Notes (Unsigned)
Subjective:  Patient ID: Jacqueline Mata, female    DOB: 1941/09/16  Age: 82 y.o. MRN: [id number]  CC: The primary encounter diagnosis was Hyperlipidemia associated with type 2 diabetes mellitus ([place]). Diagnoses of Acquired hypothyroidism, Diabetes mellitus type 2, diet-controlled ([place]), and Long-term use of high-risk medication were also pertinent to this visit.   HPI Jacqueline Mata presents for  Chief Complaint  Patient presents with   Medical Management of Chronic Issues    Follow up on diabetes, hyperlipidemia, hypothyroidism    1) Type 2 DM:  last seen Feb 2023.  Has been diet controlled for the past year .  For the last several months she has  noted elevated readings In spite of exercising regularly.  Had a steroid short in right shoulder last  Summer.  Has noticed fasiting in the 140-50 's   occasionally in the 130's   not checking other times   Lab Results  Component Value Date   HGBA1C 6.8 (H) 11/06/2021   2) onychomycosis :  did not start the fluconazole prescribed by Dr Kellie Moor . Had an adverse reaction to terbinafine   Outpatient Medications Prior to Visit  Medication Sig Dispense Refill   amoxicillin (AMOXIL) 500 MG capsule Take 2,000 mg by mouth See admin instructions. Dental procedures. Takes '2000mg'$  one hour prior to dental procedures.     Azelaic Acid 15 % FOAM Apply topically.     blood glucose meter kit and supplies Dispense based on patient and insurance preference. Use to check blood sugars one time daily. (ICD-10 E11.9). 1 each 0   Cholecalciferol (VITAMIN D) 50 MCG (2000 UT) tablet Take 2,000 Units by mouth daily.     COLLAGEN PO Take 1 Scoop by mouth every other day. With Probiotic     folic acid (FOLVITE) 993 MCG tablet Take 800 mcg by mouth daily.     glucose blood (ONETOUCH VERIO) test strip Use to check blood sugar once daily. 100 strip 3   Ivermectin 1 % CREA Apply 1 application  topically every evening.     Lancets (ONETOUCH DELICA PLUS [id number])  MISC USE TO CHECK BLOOD SUGAR ONCE DAILY 100 each 0   levothyroxine (SYNTHROID) 88 MCG tablet TAKE 1 TABLET BY MOUTH DAILY BEFORE BREAKFAST 90 tablet 1   loratadine (CLARITIN) 10 MG tablet Take 10 mg by mouth daily.     OVER THE COUNTER MEDICATION Place 2 sprays into the nose daily. Sovereign Silver Nasal Spray (Immune Support)     Propylene Glycol (SYSTANE BALANCE) 0.6 % SOLN Place 1 drop into both eyes daily as needed (dry eyes).     vitamin B-12 (CYANOCOBALAMIN) 1000 MCG tablet Take 1,000 mcg by mouth daily.     fluconazole (DIFLUCAN) 200 MG tablet Take 200 mg by mouth once a week. (Patient not taking: Reported on 10/21/2022)     No facility-administered medications prior to visit.    Review of Systems;  Patient denies headache, fevers, malaise, unintentional weight loss, skin rash, eye pain, sinus congestion and sinus pain, sore throat, dysphagia,  hemoptysis , cough, dyspnea, wheezing, chest pain, palpitations, orthopnea, edema, abdominal pain, nausea, melena, diarrhea, constipation, flank pain, dysuria, hematuria, urinary  Frequency, nocturia, numbness, tingling, seizures,  Focal weakness, Loss of consciousness,  Tremor, insomnia, depression, anxiety, and suicidal ideation.      Objective:  BP 118/60   Pulse (!) 114   Temp 97.9 F (36.6 C) (Oral)   Ht '5\' 5"'$  (1.651 m)   Wt 174 lb (  78.9 kg)   SpO2 95%   BMI 28.96 kg/m   BP Readings from Last 3 Encounters:  10/21/22 118/60  08/07/22 (!) 146/85  01/08/22 (!) 145/77    Wt Readings from Last 3 Encounters:  10/21/22 174 lb (78.9 kg)  08/07/22 175 lb (79.4 kg)  07/14/22 169 lb (76.7 kg)    Physical Exam  Lab Results  Component Value Date   HGBA1C 6.8 (H) 11/06/2021   HGBA1C 6.8 (H) 05/02/2021   HGBA1C 6.5 10/31/2020    Lab Results  Component Value Date   CREATININE 1.01 (H) 08/07/2022   CREATININE 0.93 01/08/2022   CREATININE 0.98 11/06/2021    Lab Results  Component Value Date   WBC 8.2 08/07/2022   HGB 14.9  08/07/2022   HCT 43.5 08/07/2022   PLT 188 08/07/2022   GLUCOSE 141 (H) 08/07/2022   CHOL 214 (H) 11/06/2021   TRIG 83.0 11/06/2021   HDL 81.50 11/06/2021   LDLDIRECT 122.0 07/01/2017   LDLCALC 116 (H) 11/06/2021   ALT 22 08/07/2022   AST 20 08/07/2022   NA 138 08/07/2022   K 4.1 08/07/2022   CL 103 08/07/2022   CREATININE 1.01 (H) 08/07/2022   BUN 15 08/07/2022   CO2 25 08/07/2022   TSH 0.92 11/06/2021   INR 1.1 11/05/2020   HGBA1C 6.8 (H) 11/06/2021   MICROALBUR <0.7 11/06/2021    MM 3D SCREEN BREAST BILATERAL  Result Date: 06/03/2022 CLINICAL DATA:  Screening. EXAM: DIGITAL SCREENING BILATERAL MAMMOGRAM WITH TOMOSYNTHESIS AND CAD TECHNIQUE: Bilateral screening digital craniocaudal and mediolateral oblique mammograms were obtained. Bilateral screening digital breast tomosynthesis was performed. The images were evaluated with computer-aided detection. COMPARISON:  Previous exam(s). ACR Breast Density Category b: There are scattered areas of fibroglandular density. FINDINGS: There are no findings suspicious for malignancy. IMPRESSION: No mammographic evidence of malignancy. A result letter of this screening mammogram will be mailed directly to the patient. RECOMMENDATION: Screening mammogram in one year. (Code:SM-B-01Y) BI-RADS CATEGORY  1: Negative. Electronically Signed   By: Marin Olp M.D.   On: 06/03/2022 14:22    Assessment & Plan:  .Hyperlipidemia associated with type 2 diabetes mellitus ([place])  Acquired hypothyroidism  Diabetes mellitus type 2, diet-controlled ([place])  Long-term use of high-risk medication     I provided 30 minutes of face-to-face time during this encounter reviewing patient's last visit with me, patient's  most recent visit with cardiology,  nephrology,  and neurology,  recent surgical and non surgical procedures, previous  labs and imaging studies, counseling on currently addressed issues,  and post visit ordering to diagnostics and therapeutics .    Follow-up: No follow-ups on file.   [name], MD

## 2022-10-21 NOTE — Patient Instructions (Signed)
Please check your blood sugars twice daily:  1) fasting and 2) 2 hours after a meal (you can vary from day to day which meal you check)  Send me your blood sugars in 2 weeks through Mychart or fax/call the office. If your sugars are still too high we will need to change your medications or add additional medications depending on your blood sugars.

## 2022-10-22 LAB — COMPREHENSIVE METABOLIC PANEL
ALT: 21 U/L (ref 0–35)
AST: 22 U/L (ref 0–37)
Albumin: 4.5 g/dL (ref 3.5–5.2)
Alkaline Phosphatase: 59 U/L (ref 39–117)
BUN: 17 mg/dL (ref 6–23)
CO2: 24 mEq/L (ref 19–32)
Calcium: 10.5 mg/dL (ref 8.4–10.5)
Chloride: 102 mEq/L (ref 96–112)
Creatinine, Ser: 1.17 mg/dL (ref 0.40–1.20)
GFR: 43.98 mL/min — ABNORMAL LOW (ref >60.00)
Glucose, Bld: 150 mg/dL — ABNORMAL HIGH (ref 70–99)
Potassium: 4 mEq/L (ref 3.5–5.1)
Sodium: 137 mEq/L (ref 135–145)
Total Bilirubin: 0.9 mg/dL (ref 0.2–1.2)
Total Protein: 7.5 g/dL (ref 6.0–8.3)

## 2022-10-22 LAB — LIPID PANEL
Cholesterol: 248 mg/dL — ABNORMAL HIGH (ref 0–200)
HDL: 84 mg/dL (ref >39.00)
LDL Cholesterol: 131 mg/dL — ABNORMAL HIGH (ref 0–99)
NonHDL: 163.61
Total CHOL/HDL Ratio: 3
Triglycerides: 164 mg/dL — ABNORMAL HIGH (ref 0.0–149.0)
VLDL: 32.8 mg/dL (ref 0.0–40.0)

## 2022-10-22 LAB — CBC WITH DIFFERENTIAL/PLATELET
Basophils Absolute: 0.1 10*3/uL (ref 0.0–0.1)
Basophils Relative: 0.7 % (ref 0.0–3.0)
Eosinophils Absolute: 0.3 10*3/uL (ref 0.0–0.7)
Eosinophils Relative: 2.8 % (ref 0.0–5.0)
HCT: 42.3 % (ref 36.0–46.0)
Hemoglobin: 14.5 g/dL (ref 12.0–15.0)
Lymphocytes Relative: 25.5 % (ref 12.0–46.0)
Lymphs Abs: 2.6 10*3/uL (ref 0.7–4.0)
MCHC: 34.2 g/dL (ref 30.0–36.0)
MCV: 91.5 fl (ref 78.0–100.0)
Monocytes Absolute: 0.6 10*3/uL (ref 0.1–1.0)
Monocytes Relative: 6 % (ref 3.0–12.0)
Neutro Abs: 6.5 10*3/uL (ref 1.4–7.7)
Neutrophils Relative %: 65 % (ref 43.0–77.0)
Platelets: 204 10*3/uL (ref 150.0–400.0)
RBC: 4.62 Mil/uL (ref 3.87–5.11)
RDW: 13.7 % (ref 11.5–15.5)
WBC: 10.1 10*3/uL (ref 4.0–10.5)

## 2022-10-22 LAB — TSH: TSH: 0.96 u[IU]/mL (ref 0.35–5.50)

## 2022-10-22 LAB — HEMOGLOBIN A1C: Hgb A1c MFr Bld: 7.4 % — ABNORMAL HIGH (ref 4.6–6.5)

## 2022-10-22 LAB — MICROALBUMIN / CREATININE URINE RATIO
Creatinine,U: 39.5 mg/dL
Microalb Creat Ratio: 1.8 mg/g (ref 0.0–30.0)
Microalb, Ur: <0.7 mg/dL (ref 0.0–1.9)

## 2022-10-22 LAB — LDL CHOLESTEROL, DIRECT: Direct LDL: 137 mg/dL

## 2022-10-22 NOTE — Assessment & Plan Note (Signed)
Thyroid function is normal and she has no symptoms of overactive thyroid   Lab Results  Component Value Date   TSH 0.96 10/21/2022

## 2022-10-23 ENCOUNTER — Encounter: Payer: Self-pay | Admitting: Internal Medicine

## 2022-10-27 ENCOUNTER — Encounter: Payer: Self-pay | Admitting: Internal Medicine

## 2022-10-28 ENCOUNTER — Encounter: Payer: Self-pay | Admitting: Internal Medicine

## 2022-10-29 DIAGNOSIS — M62830 Muscle spasm of back: Secondary | ICD-10-CM | POA: Diagnosis not present

## 2022-10-29 DIAGNOSIS — M546 Pain in thoracic spine: Secondary | ICD-10-CM | POA: Diagnosis not present

## 2022-10-29 DIAGNOSIS — M542 Cervicalgia: Secondary | ICD-10-CM | POA: Diagnosis not present

## 2022-10-29 DIAGNOSIS — M9903 Segmental and somatic dysfunction of lumbar region: Secondary | ICD-10-CM | POA: Diagnosis not present

## 2022-11-02 ENCOUNTER — Encounter: Payer: Self-pay | Admitting: Internal Medicine

## 2022-11-02 ENCOUNTER — Telehealth: Payer: Self-pay

## 2022-11-02 ENCOUNTER — Ambulatory Visit (INDEPENDENT_AMBULATORY_CARE_PROVIDER_SITE_OTHER): Payer: PPO | Admitting: Internal Medicine

## 2022-11-02 VITALS — BP 118/70 | HR 98 | Temp 97.8°F | Ht 65.0 in | Wt 171.2 lb

## 2022-11-02 DIAGNOSIS — E1169 Type 2 diabetes mellitus with other specified complication: Secondary | ICD-10-CM

## 2022-11-02 DIAGNOSIS — N183 Chronic kidney disease, stage 3 unspecified: Secondary | ICD-10-CM | POA: Diagnosis not present

## 2022-11-02 DIAGNOSIS — E1122 Type 2 diabetes mellitus with diabetic chronic kidney disease: Secondary | ICD-10-CM

## 2022-11-02 DIAGNOSIS — E785 Hyperlipidemia, unspecified: Secondary | ICD-10-CM

## 2022-11-02 DIAGNOSIS — B351 Tinea unguium: Secondary | ICD-10-CM | POA: Diagnosis not present

## 2022-11-02 MED ORDER — EFINACONAZOLE 10 % EX SOLN: CUTANEOUS | 1 refills | Status: DC

## 2022-11-02 MED ORDER — EZETIMIBE 10 MG PO TABS: 10.0000 mg | ORAL_TABLET | Freq: Every day | ORAL | 1 refills | Status: DC

## 2022-11-02 NOTE — Progress Notes (Unsigned)
Subjective:  Patient ID: Jacqueline Mata, female    DOB: June 27, 1941  Age: 82 y.o. MRN: [id number]  CC: There were no encounter diagnoses.   HPI Jacqueline Mata presents for follow up on multple issues Chief Complaint  Patient presents with   Medical Management of Chronic Issues    Discuss medications   1) type 2 DM with CKd, hyperlipidemia and statin myalgia. Advised to try zetia  2) onychomycosis : intolerant of terbinafine (,  had a rash  with lamasil)  given once weekly dose of fluconazole at last visit jan 24 but has not started it.    Outpatient Medications Prior to Visit  Medication Sig Dispense Refill   amoxicillin (AMOXIL) 500 MG capsule Take 2,000 mg by mouth See admin instructions. Dental procedures. Takes '2000mg'$  one hour prior to dental procedures.     Azelaic Acid 15 % FOAM Apply topically.     blood glucose meter kit and supplies Dispense based on patient and insurance preference. Use to check blood sugars one time daily. (ICD-10 E11.9). 1 each 0   Cholecalciferol (VITAMIN D) 50 MCG (2000 UT) tablet Take 2,000 Units by mouth daily.     COLLAGEN PO Take 1 Scoop by mouth every other day. With Probiotic     fluconazole (DIFLUCAN) 200 MG tablet Take 200 mg by mouth once a week.     folic acid (FOLVITE) 976 MCG tablet Take 800 mcg by mouth daily.     glucose blood (ONETOUCH VERIO) test strip Use to check blood sugar once daily. 100 strip 3   glucose blood (ONETOUCH VERIO) test strip Use to check blood sugars twice daily. 100 each 12   Ivermectin 1 % CREA Apply 1 application  topically every evening.     Lancets (ONETOUCH DELICA PLUS [id number]) MISC USE TO CHECK BLOOD SUGAR ONCE DAILY 100 each 0   Lancets (ONETOUCH DELICA PLUS [id number]) MISC Use to check blood sugars twice daily. 100 each 3   levothyroxine (SYNTHROID) 88 MCG tablet TAKE 1 TABLET BY MOUTH DAILY BEFORE BREAKFAST 90 tablet 1   loratadine (CLARITIN) 10 MG tablet Take 10 mg by mouth daily.     OVER THE  COUNTER MEDICATION Place 2 sprays into the nose daily. Sovereign Silver Nasal Spray (Immune Support)     Propylene Glycol (SYSTANE BALANCE) 0.6 % SOLN Place 1 drop into both eyes daily as needed (dry eyes).     vitamin B-12 (CYANOCOBALAMIN) 1000 MCG tablet Take 1,000 mcg by mouth daily.     No facility-administered medications prior to visit.    Review of Systems;  Patient denies headache, fevers, malaise, unintentional weight loss, skin rash, eye pain, sinus congestion and sinus pain, sore throat, dysphagia,  hemoptysis , cough, dyspnea, wheezing, chest pain, palpitations, orthopnea, edema, abdominal pain, nausea, melena, diarrhea, constipation, flank pain, dysuria, hematuria, urinary  Frequency, nocturia, numbness, tingling, seizures,  Focal weakness, Loss of consciousness,  Tremor, insomnia, depression, anxiety, and suicidal ideation.      Objective:  BP 118/70   Pulse 98   Temp 97.8 F (36.6 C) (Oral)   Ht '5\' 5"'$  (1.651 m)   Wt 171 lb 3.2 oz (77.7 kg)   SpO2 97%   BMI 28.49 kg/m   BP Readings from Last 3 Encounters:  11/02/22 118/70  10/21/22 118/60  08/07/22 (!) 146/85    Wt Readings from Last 3 Encounters:  11/02/22 171 lb 3.2 oz (77.7 kg)  10/21/22 174 lb (78.9 kg)  08/07/22 175 lb (  79.4 kg)    Physical Exam  Lab Results  Component Value Date   HGBA1C 7.4 (H) 10/21/2022   HGBA1C 6.8 (H) 11/06/2021   HGBA1C 6.8 (H) 05/02/2021    Lab Results  Component Value Date   CREATININE 1.17 10/21/2022   CREATININE 1.01 (H) 08/07/2022   CREATININE 0.93 01/08/2022    Lab Results  Component Value Date   WBC 10.1 10/21/2022   HGB 14.5 10/21/2022   HCT 42.3 10/21/2022   PLT 204.0 10/21/2022   GLUCOSE 150 (H) 10/21/2022   CHOL 248 (H) 10/21/2022   TRIG 164.0 (H) 10/21/2022   HDL 84.00 10/21/2022   LDLDIRECT 137.0 10/21/2022   LDLCALC 131 (H) 10/21/2022   ALT 21 10/21/2022   AST 22 10/21/2022   NA 137 10/21/2022   K 4.0 10/21/2022   CL 102 10/21/2022    CREATININE 1.17 10/21/2022   BUN 17 10/21/2022   CO2 24 10/21/2022   TSH 0.96 10/21/2022   INR 1.1 11/05/2020   HGBA1C 7.4 (H) 10/21/2022   MICROALBUR <0.7 10/21/2022    MM 3D SCREEN BREAST BILATERAL  Result Date: 06/03/2022 CLINICAL DATA:  Screening. EXAM: DIGITAL SCREENING BILATERAL MAMMOGRAM WITH TOMOSYNTHESIS AND CAD TECHNIQUE: Bilateral screening digital craniocaudal and mediolateral oblique mammograms were obtained. Bilateral screening digital breast tomosynthesis was performed. The images were evaluated with computer-aided detection. COMPARISON:  Previous exam(s). ACR Breast Density Category b: There are scattered areas of fibroglandular density. FINDINGS: There are no findings suspicious for malignancy. IMPRESSION: No mammographic evidence of malignancy. A result letter of this screening mammogram will be mailed directly to the patient. RECOMMENDATION: Screening mammogram in one year. (Code:SM-B-01Y) BI-RADS CATEGORY  1: Negative. Electronically Signed   By: Marin Olp M.D.   On: 06/03/2022 14:22    Assessment & Plan:  .There are no diagnoses linked to this encounter.   I provided 30 minutes of face-to-face time during this encounter reviewing patient's last visit with me, patient's  most recent visit with cardiology,  nephrology,  and neurology,  recent surgical and non surgical procedures, previous  labs and imaging studies, counseling on currently addressed issues,  and post visit ordering to diagnostics and therapeutics .   Follow-up: No follow-ups on file.   [name], MD

## 2022-11-02 NOTE — Patient Instructions (Addendum)
You can take your generic Zetia at night and continue having your grapefruit juice in the morning   If you decide to try the weekly dose of fluconazole, there is NO CONTRAINDICATION with zetia and fluconazole taken together, but we will check liver enzymes every 6 weeks   We are rechecking kidney function today, if still < 50 you should see Dr Radene Knee again   Return after 3 weeks to have liver and kidney function rechecked  [facility] to try omega XL  for pain  For the foot pain :   You can add up to 2000 mg of acetominophen (tylenol) every day safely  In divided doses (500 mg every 6 hours  Or 1000 mg every 12 hours.)   For the diabetes:  Check sugars pre dinner and 2 hours after dinner.  STOP CHECKING THE MORNING SUGARS

## 2022-11-02 NOTE — Addendum Note (Signed)
Addended by: [name] on: 11/02/2022 03:49 PM   Modules accepted: Orders

## 2022-11-02 NOTE — Telephone Encounter (Signed)
Pt was at check out and stated that she needs a rx for the Zetia sent to [facility].

## 2022-11-03 LAB — COMPREHENSIVE METABOLIC PANEL
ALT: 25 U/L (ref 0–35)
AST: 23 U/L (ref 0–37)
Albumin: 4.7 g/dL (ref 3.5–5.2)
Alkaline Phosphatase: 59 U/L (ref 39–117)
BUN: 20 mg/dL (ref 6–23)
CO2: 20 mEq/L (ref 19–32)
Calcium: 10.1 mg/dL (ref 8.4–10.5)
Chloride: 105 mEq/L (ref 96–112)
Creatinine, Ser: 1.14 mg/dL (ref 0.40–1.20)
GFR: 45.36 mL/min — ABNORMAL LOW (ref >60.00)
Glucose, Bld: 109 mg/dL — ABNORMAL HIGH (ref 70–99)
Potassium: 4.2 mEq/L (ref 3.5–5.1)
Sodium: 138 mEq/L (ref 135–145)
Total Bilirubin: 0.7 mg/dL (ref 0.2–1.2)
Total Protein: 7.4 g/dL (ref 6.0–8.3)

## 2022-11-03 NOTE — Assessment & Plan Note (Addendum)
She is willing to initiate a trial of zetia after prolonged discussion today.  Return in 3 weeeks for LFT. Regarding her elevated A1c,  reviewed fasting BS today which have been averaging 130 to 150.   Advised to check pre and post dinnertime BS for the next two weeks and submit for evaluation

## 2022-11-03 NOTE — Assessment & Plan Note (Signed)
Treatment options are limited due to prior intolerance to lamisil and noncoverage of topicals based on search done today. There is no drug interaction between zetia and once weekly fluconazole

## 2022-11-05 ENCOUNTER — Encounter: Payer: Self-pay | Admitting: Internal Medicine

## 2022-11-09 ENCOUNTER — Encounter: Payer: Self-pay | Admitting: Internal Medicine

## 2022-11-17 ENCOUNTER — Encounter: Payer: Self-pay | Admitting: Internal Medicine

## 2022-11-19 DIAGNOSIS — M546 Pain in thoracic spine: Secondary | ICD-10-CM | POA: Diagnosis not present

## 2022-11-19 DIAGNOSIS — M62830 Muscle spasm of back: Secondary | ICD-10-CM | POA: Diagnosis not present

## 2022-11-19 DIAGNOSIS — M542 Cervicalgia: Secondary | ICD-10-CM | POA: Diagnosis not present

## 2022-11-19 DIAGNOSIS — M9903 Segmental and somatic dysfunction of lumbar region: Secondary | ICD-10-CM | POA: Diagnosis not present

## 2022-11-20 ENCOUNTER — Encounter: Payer: Self-pay | Admitting: Internal Medicine

## 2022-11-20 ENCOUNTER — Telehealth: Payer: Self-pay | Admitting: Internal Medicine

## 2022-11-20 DIAGNOSIS — E785 Hyperlipidemia, unspecified: Secondary | ICD-10-CM

## 2022-11-20 NOTE — Telephone Encounter (Signed)
Not sure what is needed for this lab appt. She just had labs done in January and a cmet repeated on 11/02/2022

## 2022-11-20 NOTE — Telephone Encounter (Signed)
Patient has a lab appt 11/24/2022, there are no orders in.

## 2022-11-23 NOTE — Telephone Encounter (Signed)
Attempted to call pt. Need to cancel her lab appt for tomorrow.

## 2022-11-23 NOTE — Telephone Encounter (Signed)
Cmet ordered

## 2022-11-23 NOTE — Telephone Encounter (Signed)
Pt want labs done to check kidney and liver for the new medication she is on. Pt stated its on her AVS

## 2022-11-23 NOTE — Telephone Encounter (Signed)
LMTCB. Need to reschedule the pt's non fasting lab appt.

## 2022-11-23 NOTE — Telephone Encounter (Signed)
Lab appt scheduled.

## 2022-11-23 NOTE — Addendum Note (Signed)
Addended by: [name] on: 11/23/2022 12:33 PM   Modules accepted: Orders

## 2022-11-24 ENCOUNTER — Other Ambulatory Visit: Payer: PPO

## 2022-11-25 ENCOUNTER — Other Ambulatory Visit: Payer: Self-pay

## 2022-11-25 DIAGNOSIS — N183 Chronic kidney disease, stage 3 unspecified: Secondary | ICD-10-CM | POA: Diagnosis not present

## 2022-11-25 DIAGNOSIS — N1831 Chronic kidney disease, stage 3a: Secondary | ICD-10-CM | POA: Diagnosis not present

## 2022-11-25 DIAGNOSIS — E785 Hyperlipidemia, unspecified: Secondary | ICD-10-CM | POA: Diagnosis not present

## 2022-11-25 DIAGNOSIS — E1122 Type 2 diabetes mellitus with diabetic chronic kidney disease: Secondary | ICD-10-CM | POA: Diagnosis not present

## 2022-11-25 MED ORDER — LEVOTHYROXINE SODIUM 88 MCG PO TABS: 88.0000 ug | ORAL_TABLET | Freq: Every day | ORAL | 1 refills | Status: DC

## 2022-11-27 ENCOUNTER — Other Ambulatory Visit (INDEPENDENT_AMBULATORY_CARE_PROVIDER_SITE_OTHER): Payer: PPO

## 2022-11-27 DIAGNOSIS — E1169 Type 2 diabetes mellitus with other specified complication: Secondary | ICD-10-CM

## 2022-11-27 DIAGNOSIS — E785 Hyperlipidemia, unspecified: Secondary | ICD-10-CM | POA: Diagnosis not present

## 2022-11-27 LAB — COMPREHENSIVE METABOLIC PANEL
ALT: 28 U/L (ref 0–35)
AST: 23 U/L (ref 0–37)
Albumin: 4.3 g/dL (ref 3.5–5.2)
Alkaline Phosphatase: 56 U/L (ref 39–117)
BUN: 16 mg/dL (ref 6–23)
CO2: 25 mEq/L (ref 19–32)
Calcium: 9.8 mg/dL (ref 8.4–10.5)
Chloride: 103 mEq/L (ref 96–112)
Creatinine, Ser: 1.03 mg/dL (ref 0.40–1.20)
GFR: 51.21 mL/min — ABNORMAL LOW (ref >60.00)
Glucose, Bld: 127 mg/dL — ABNORMAL HIGH (ref 70–99)
Potassium: 4.1 mEq/L (ref 3.5–5.1)
Sodium: 138 mEq/L (ref 135–145)
Total Bilirubin: 1.1 mg/dL (ref 0.2–1.2)
Total Protein: 6.8 g/dL (ref 6.0–8.3)

## 2022-11-29 ENCOUNTER — Encounter: Payer: Self-pay | Admitting: Internal Medicine

## 2022-11-29 DIAGNOSIS — R748 Abnormal levels of other serum enzymes: Secondary | ICD-10-CM

## 2022-12-07 ENCOUNTER — Encounter: Payer: Self-pay | Admitting: Internal Medicine

## 2022-12-08 ENCOUNTER — Encounter: Payer: Self-pay | Admitting: Podiatry

## 2022-12-08 ENCOUNTER — Ambulatory Visit: Payer: PPO | Admitting: Podiatry

## 2022-12-08 VITALS — BP 109/71 | HR 95

## 2022-12-08 DIAGNOSIS — M7752 Other enthesopathy of left foot: Secondary | ICD-10-CM | POA: Diagnosis not present

## 2022-12-08 MED ORDER — BETAMETHASONE SOD PHOS & ACET 6 (3-3) MG/ML IJ SUSP
3.0000 mg | Freq: Once | INTRAMUSCULAR | Status: AC
  Administered 2022-12-08: 3 mg via INTRA_ARTICULAR

## 2022-12-08 NOTE — Progress Notes (Signed)
Chief Complaint  Patient presents with   Foot Pain    "I have a problem with the ankle, the tissue has become inflammed."    Subjective:  82 y.o. female presenting today for follow-up evaluation of pain to the left ankle joint.  Patient states that she likes to walk and after a few minutes of walking she starts to have some tenderness to the left ankle.  She takes meloxicam occasionally.   Past Medical History:  Diagnosis Date   Arthritis    breast cancer 2005   bilateral   Cataract 01/04/13 and [id number]   Complication of anesthesia    dizziness    Hyperlipidemia    hypothyroidism    Hypothyroidism    Personal history of chemotherapy    Personal history of radiation therapy    PONV (postoperative nausea and vomiting)    Pre-diabetes    Sleep apnea    mild diagnosis no cpap   Tinnitus    Past Surgical History:  Procedure Laterality Date   bilateral knee replacement s     BREAST BIOPSY Left 2005   positive   BREAST BIOPSY Right 2005   positive   BREAST LUMPECTOMY Left 2005   BREAST LUMPECTOMY Right 2005   gumm surgery      JOINT REPLACEMENT  2013   by Dr. Marry Guan   righ tknee arthroscopy      TONSILLECTOMY     TOTAL HIP ARTHROPLASTY Right 11/12/2020   Procedure: RIGHT TOTAL HIP ARTHROPLASTY ANTERIOR APPROACH;  Surgeon: Melrose Nakayama, MD;  Location: [facility];  Service: Orthopedics;  Laterality: Right;   TOTAL HIP ARTHROPLASTY Left 11/25/2021   Procedure: LEFT TOTAL HIP ARTHROPLASTY ANTERIOR APPROACH;  Surgeon: Melrose Nakayama, MD;  Location: [facility];  Service: Orthopedics;  Laterality: Left;   Allergies  Allergen Reactions   Codeine Shortness Of Breath and Other (See Comments)    Altered mental staus   Ilevro [Nepafenac] Other (See Comments)    Eye swelling Eye drops    Other Swelling    DOGS.  Congestion. FEATHERS.  Congestion  Can not take preservative free eye drops     Maxitrol [Neomycin-Polymyxin-Dexameth]     dry eyes    Metformin And Related      Shakiness    Augmentin [Amoxicillin-Pot Clavulanate] Itching and Rash   Molds & Smuts Other (See Comments)    Congestion.   Neomycin Rash   Neomycin-Bacitracin Zn-Polymyx Rash   Neomycin-Polymyxin-Hc Rash   Tape Dermatitis    Blisters   Tapentadol Nausea And Vomiting    Nucynta   Terbinafine Rash    Trouble swallowing   Tramadol Other (See Comments), Nausea Only and Nausea And Vomiting    dizziness     Objective / Physical Exam:  General:  The patient is alert and oriented x3 in no acute distress. Dermatology:  Skin is warm, dry and supple bilateral lower extremities. Negative for open lesions or macerations. Vascular:  Palpable pedal pulses bilaterally. No edema or erythema noted. Capillary refill within normal limits. Neurological:  Epicritic and protective threshold grossly intact bilaterally.  Musculoskeletal Exam:  Tenderness on palpation to the anterior lateral medial aspects of the patient's left ankle. Mild edema noted. Range of motion within normal limits to all pedal and ankle joints bilateral. Muscle strength 5/5 in all groups bilateral.   Radiographic Exam LT ankle 04/21/2022:  Normal osseous mineralization. Joint spaces preserved.  There may be some slight joint space narrowing to the medial gutter of the tibiotalar  joint. No fracture/dislocation/boney destruction.    Assessment: 1.  Capsulitis left ankle  -Patient evaluated -Injection 0.5 cc Celestone Soluspan injected in the left ankle joint -Continue wearing good supportive sneakers in tennis shoes -Continue meloxicam 15 mg daily as needed.  Patient already has an active prescription for this by another provider -Return to clinic as needed   Edrick Kins, DPM [facility]  Dr. Edrick Kins, DPM    [address], [place] [address]                Office [phone]  Fax [phone]

## 2022-12-10 DIAGNOSIS — M542 Cervicalgia: Secondary | ICD-10-CM | POA: Diagnosis not present

## 2022-12-10 DIAGNOSIS — M9903 Segmental and somatic dysfunction of lumbar region: Secondary | ICD-10-CM | POA: Diagnosis not present

## 2022-12-10 DIAGNOSIS — M62830 Muscle spasm of back: Secondary | ICD-10-CM | POA: Diagnosis not present

## 2022-12-10 DIAGNOSIS — M546 Pain in thoracic spine: Secondary | ICD-10-CM | POA: Diagnosis not present

## 2023-01-07 DIAGNOSIS — M546 Pain in thoracic spine: Secondary | ICD-10-CM | POA: Diagnosis not present

## 2023-01-07 DIAGNOSIS — M9903 Segmental and somatic dysfunction of lumbar region: Secondary | ICD-10-CM | POA: Diagnosis not present

## 2023-01-07 DIAGNOSIS — M542 Cervicalgia: Secondary | ICD-10-CM | POA: Diagnosis not present

## 2023-01-07 DIAGNOSIS — M62830 Muscle spasm of back: Secondary | ICD-10-CM | POA: Diagnosis not present

## 2023-01-12 ENCOUNTER — Other Ambulatory Visit (INDEPENDENT_AMBULATORY_CARE_PROVIDER_SITE_OTHER): Payer: PPO

## 2023-01-12 DIAGNOSIS — R748 Abnormal levels of other serum enzymes: Secondary | ICD-10-CM | POA: Diagnosis not present

## 2023-01-12 LAB — COMPREHENSIVE METABOLIC PANEL
ALT: 29 U/L (ref 0–35)
AST: 23 U/L (ref 0–37)
Albumin: 4.3 g/dL (ref 3.5–5.2)
Alkaline Phosphatase: 54 U/L (ref 39–117)
BUN: 15 mg/dL (ref 6–23)
CO2: 24 mEq/L (ref 19–32)
Calcium: 9.3 mg/dL (ref 8.4–10.5)
Chloride: 105 mEq/L (ref 96–112)
Creatinine, Ser: 1.01 mg/dL (ref 0.40–1.20)
GFR: 52.39 mL/min — ABNORMAL LOW (ref >60.00)
Glucose, Bld: 151 mg/dL — ABNORMAL HIGH (ref 70–99)
Potassium: 4.1 mEq/L (ref 3.5–5.1)
Sodium: 138 mEq/L (ref 135–145)
Total Bilirubin: 0.7 mg/dL (ref 0.2–1.2)
Total Protein: 6.7 g/dL (ref 6.0–8.3)

## 2023-01-13 DIAGNOSIS — L720 Epidermal cyst: Secondary | ICD-10-CM | POA: Diagnosis not present

## 2023-01-13 DIAGNOSIS — L821 Other seborrheic keratosis: Secondary | ICD-10-CM | POA: Diagnosis not present

## 2023-01-13 DIAGNOSIS — D0472 Carcinoma in situ of skin of left lower limb, including hip: Secondary | ICD-10-CM | POA: Diagnosis not present

## 2023-01-13 DIAGNOSIS — D485 Neoplasm of uncertain behavior of skin: Secondary | ICD-10-CM | POA: Diagnosis not present

## 2023-01-13 DIAGNOSIS — B351 Tinea unguium: Secondary | ICD-10-CM | POA: Diagnosis not present

## 2023-01-13 DIAGNOSIS — L298 Other pruritus: Secondary | ICD-10-CM | POA: Diagnosis not present

## 2023-01-13 DIAGNOSIS — D0439 Carcinoma in situ of skin of other parts of face: Secondary | ICD-10-CM | POA: Diagnosis not present

## 2023-01-13 DIAGNOSIS — L820 Inflamed seborrheic keratosis: Secondary | ICD-10-CM | POA: Diagnosis not present

## 2023-01-13 DIAGNOSIS — L538 Other specified erythematous conditions: Secondary | ICD-10-CM | POA: Diagnosis not present

## 2023-01-21 ENCOUNTER — Ambulatory Visit: Payer: PPO | Admitting: Internal Medicine

## 2023-01-21 DIAGNOSIS — M542 Cervicalgia: Secondary | ICD-10-CM | POA: Diagnosis not present

## 2023-01-21 DIAGNOSIS — M62830 Muscle spasm of back: Secondary | ICD-10-CM | POA: Diagnosis not present

## 2023-01-21 DIAGNOSIS — M9903 Segmental and somatic dysfunction of lumbar region: Secondary | ICD-10-CM | POA: Diagnosis not present

## 2023-01-21 DIAGNOSIS — M546 Pain in thoracic spine: Secondary | ICD-10-CM | POA: Diagnosis not present

## 2023-02-02 ENCOUNTER — Other Ambulatory Visit: Payer: Self-pay | Admitting: Internal Medicine

## 2023-02-03 DIAGNOSIS — M62830 Muscle spasm of back: Secondary | ICD-10-CM | POA: Diagnosis not present

## 2023-02-03 DIAGNOSIS — M542 Cervicalgia: Secondary | ICD-10-CM | POA: Diagnosis not present

## 2023-02-03 DIAGNOSIS — M9903 Segmental and somatic dysfunction of lumbar region: Secondary | ICD-10-CM | POA: Diagnosis not present

## 2023-02-03 DIAGNOSIS — M546 Pain in thoracic spine: Secondary | ICD-10-CM | POA: Diagnosis not present

## 2023-02-04 ENCOUNTER — Inpatient Hospital Stay: Payer: PPO

## 2023-02-04 ENCOUNTER — Inpatient Hospital Stay: Payer: PPO | Admitting: Hematology & Oncology

## 2023-02-04 DIAGNOSIS — M62830 Muscle spasm of back: Secondary | ICD-10-CM | POA: Diagnosis not present

## 2023-02-04 DIAGNOSIS — M546 Pain in thoracic spine: Secondary | ICD-10-CM | POA: Diagnosis not present

## 2023-02-04 DIAGNOSIS — M9903 Segmental and somatic dysfunction of lumbar region: Secondary | ICD-10-CM | POA: Diagnosis not present

## 2023-02-04 DIAGNOSIS — M542 Cervicalgia: Secondary | ICD-10-CM | POA: Diagnosis not present

## 2023-02-05 ENCOUNTER — Other Ambulatory Visit: Payer: PPO

## 2023-02-05 DIAGNOSIS — M542 Cervicalgia: Secondary | ICD-10-CM | POA: Diagnosis not present

## 2023-02-05 DIAGNOSIS — M62830 Muscle spasm of back: Secondary | ICD-10-CM | POA: Diagnosis not present

## 2023-02-05 DIAGNOSIS — M546 Pain in thoracic spine: Secondary | ICD-10-CM | POA: Diagnosis not present

## 2023-02-05 DIAGNOSIS — M9903 Segmental and somatic dysfunction of lumbar region: Secondary | ICD-10-CM | POA: Diagnosis not present

## 2023-02-08 DIAGNOSIS — Z961 Presence of intraocular lens: Secondary | ICD-10-CM | POA: Diagnosis not present

## 2023-02-08 LAB — HM DIABETES EYE EXAM

## 2023-02-09 ENCOUNTER — Ambulatory Visit: Payer: PPO | Admitting: Podiatry

## 2023-02-10 DIAGNOSIS — D0472 Carcinoma in situ of skin of left lower limb, including hip: Secondary | ICD-10-CM | POA: Diagnosis not present

## 2023-02-12 DIAGNOSIS — M542 Cervicalgia: Secondary | ICD-10-CM | POA: Diagnosis not present

## 2023-02-12 DIAGNOSIS — M546 Pain in thoracic spine: Secondary | ICD-10-CM | POA: Diagnosis not present

## 2023-02-12 DIAGNOSIS — M62830 Muscle spasm of back: Secondary | ICD-10-CM | POA: Diagnosis not present

## 2023-02-12 DIAGNOSIS — M9903 Segmental and somatic dysfunction of lumbar region: Secondary | ICD-10-CM | POA: Diagnosis not present

## 2023-02-18 ENCOUNTER — Inpatient Hospital Stay: Payer: PPO

## 2023-02-18 ENCOUNTER — Inpatient Hospital Stay: Payer: PPO | Admitting: Hematology & Oncology

## 2023-02-25 DIAGNOSIS — M62830 Muscle spasm of back: Secondary | ICD-10-CM | POA: Diagnosis not present

## 2023-02-25 DIAGNOSIS — M546 Pain in thoracic spine: Secondary | ICD-10-CM | POA: Diagnosis not present

## 2023-02-25 DIAGNOSIS — M9903 Segmental and somatic dysfunction of lumbar region: Secondary | ICD-10-CM | POA: Diagnosis not present

## 2023-02-25 DIAGNOSIS — M542 Cervicalgia: Secondary | ICD-10-CM | POA: Diagnosis not present

## 2023-02-26 ENCOUNTER — Other Ambulatory Visit: Payer: Self-pay | Admitting: Internal Medicine

## 2023-03-02 ENCOUNTER — Encounter: Payer: Self-pay | Admitting: Internal Medicine

## 2023-03-04 ENCOUNTER — Inpatient Hospital Stay: Payer: PPO | Attending: Hematology & Oncology

## 2023-03-04 ENCOUNTER — Encounter: Payer: Self-pay | Admitting: Hematology & Oncology

## 2023-03-04 ENCOUNTER — Inpatient Hospital Stay (HOSPITAL_BASED_OUTPATIENT_CLINIC_OR_DEPARTMENT_OTHER): Payer: PPO | Admitting: Hematology & Oncology

## 2023-03-04 VITALS — BP 119/70 | HR 81 | Temp 98.1°F | Resp 19 | Wt 166.0 lb

## 2023-03-04 DIAGNOSIS — Z853 Personal history of malignant neoplasm of breast: Secondary | ICD-10-CM | POA: Insufficient documentation

## 2023-03-04 DIAGNOSIS — C50919 Malignant neoplasm of unspecified site of unspecified female breast: Secondary | ICD-10-CM

## 2023-03-04 DIAGNOSIS — Z170 Estrogen receptor positive status [ER+]: Secondary | ICD-10-CM | POA: Diagnosis not present

## 2023-03-04 LAB — CBC WITH DIFFERENTIAL (CANCER CENTER ONLY)
Abs Immature Granulocytes: 0.04 K/uL (ref 0.00–0.07)
Basophils Absolute: 0.1 K/uL (ref 0.0–0.1)
Basophils Relative: 1 %
Eosinophils Absolute: 0.3 K/uL (ref 0.0–0.5)
Eosinophils Relative: 3 %
HCT: 44.8 % (ref 36.0–46.0)
Hemoglobin: 15.1 g/dL — ABNORMAL HIGH (ref 12.0–15.0)
Immature Granulocytes: 0 %
Lymphocytes Relative: 29 %
Lymphs Abs: 2.6 K/uL (ref 0.7–4.0)
MCH: 30.6 pg (ref 26.0–34.0)
MCHC: 33.7 g/dL (ref 30.0–36.0)
MCV: 90.7 fL (ref 80.0–100.0)
Monocytes Absolute: 0.5 K/uL (ref 0.1–1.0)
Monocytes Relative: 5 %
Neutro Abs: 5.6 K/uL (ref 1.7–7.7)
Neutrophils Relative %: 62 %
Platelet Count: 171 K/uL (ref 150–400)
RBC: 4.94 MIL/uL (ref 3.87–5.11)
RDW: 13.3 % (ref 11.5–15.5)
WBC Count: 9.1 K/uL (ref 4.0–10.5)
nRBC: 0 % (ref 0.0–0.2)

## 2023-03-04 LAB — CMP (CANCER CENTER ONLY)
ALT: 40 U/L (ref 0–44)
AST: 33 U/L (ref 15–41)
Albumin: 4.9 g/dL (ref 3.5–5.0)
Alkaline Phosphatase: 62 U/L (ref 38–126)
Anion gap: 8 (ref 5–15)
BUN: 16 mg/dL (ref 8–23)
CO2: 25 mmol/L (ref 22–32)
Calcium: 10.9 mg/dL — ABNORMAL HIGH (ref 8.9–10.3)
Chloride: 104 mmol/L (ref 98–111)
Creatinine: 1 mg/dL (ref 0.44–1.00)
GFR, Estimated: 57 mL/min — ABNORMAL LOW (ref >60)
Glucose, Bld: 99 mg/dL (ref 70–99)
Potassium: 3.8 mmol/L (ref 3.5–5.1)
Sodium: 137 mmol/L (ref 135–145)
Total Bilirubin: 0.9 mg/dL (ref 0.3–1.2)
Total Protein: 7.9 g/dL (ref 6.5–8.1)

## 2023-03-04 LAB — LACTATE DEHYDROGENASE: LDH: 205 U/L — ABNORMAL HIGH (ref 98–192)

## 2023-03-04 NOTE — Progress Notes (Signed)
Hematology and Oncology Follow Up Visit  Jacqueline Mata [id number] 1941/03/20 82 y.o. 03/04/2023   Principle Diagnosis:  Synchronous bilateral stage IIA (T2 N0 M0) ductal carcinoma of bilateral breast - ER+/HER2-.  Current Therapy:   Observation     Interim History:  Ms.  Mata is comes in for followup.  We see her every 6 months.  Since we last saw her, she has been doing quite well.  She really has had no complaints.  She had hip surgery a year ago.  This was for her left hip.  She is recovered quite well.  She goes to the gym 3 times a week.  She did have a tooth pulled over in the left mandibular area.  Hopefully, she will not need an implant.  She is using 5-fluorouracil cream on a squamous cell that superficial on the left side of her face.  She been doing this for about 8 days.  Hopefully, this will help.  She has had good appetite.  She has had no change in bowel or bladder habits.  She has had no nausea or vomiting.  She has had no problems with COVID.  There is been no rashes.  She has had no bleeding.  There is been no leg swelling.  Overall, I would say that her performance status is probably ECOG 1.    Medications:  Current Outpatient Medications:    amoxicillin (AMOXIL) 500 MG capsule, Take 2,000 mg by mouth See admin instructions. Dental procedures. Takes 2000mg  one hour prior to dental procedures., Disp: , Rfl:    blood glucose meter kit and supplies, Dispense based on patient and insurance preference. Use to check blood sugars one time daily. (ICD-10 E11.9)., Disp: 1 each, Rfl: 0   Cholecalciferol (VITAMIN D) 50 MCG (2000 UT) tablet, Take 2,000 Units by mouth daily., Disp: , Rfl:    COLLAGEN PO, Take 1 Scoop by mouth every other day. With Probiotic, Disp: , Rfl:    ezetimibe (ZETIA) 10 MG tablet, TAKE 1 TABLET BY MOUTH ONCE DAILY, Disp: 90 tablet, Rfl: 1   fluconazole (DIFLUCAN) 200 MG tablet, Take 200 mg by mouth once a week., Disp: , Rfl:    folic acid  (FOLVITE) 800 MCG tablet, Take 800 mcg by mouth daily., Disp: , Rfl:    glucose blood (ONETOUCH VERIO) test strip, Use to check blood sugar once daily., Disp: 100 strip, Rfl: 3   glucose blood (ONETOUCH VERIO) test strip, Use to check blood sugars twice daily., Disp: 100 each, Rfl: 12   Ivermectin 1 % CREA, Apply 1 application  topically every evening., Disp: , Rfl:    Lancets (ONETOUCH DELICA PLUS LANCET33G) MISC, USE TO CHECK BLOOD SUGAR ONCE DAILY, Disp: 100 each, Rfl: 0   Lancets (ONETOUCH DELICA PLUS LANCET33G) MISC, Use to check blood sugars twice daily., Disp: 100 each, Rfl: 3   levothyroxine (SYNTHROID) 88 MCG tablet, TAKE ONE TABLET EVERY DAY BEFORE BREAKFAST, Disp: 90 tablet, Rfl: 1   loratadine (CLARITIN) 10 MG tablet, Take 10 mg by mouth daily., Disp: , Rfl:    OVER THE COUNTER MEDICATION, Place 2 sprays into the nose daily. Sovereign Silver Nasal Spray (Immune Support), Disp: , Rfl:    Propylene Glycol (SYSTANE BALANCE) 0.6 % SOLN, Place 1 drop into both eyes daily as needed (dry eyes)., Disp: , Rfl:    vitamin B-12 (CYANOCOBALAMIN) 1000 MCG tablet, Take 1,000 mcg by mouth daily., Disp: , Rfl:   Allergies:  Allergies  Allergen Reactions  Codeine Shortness Of Breath and Other (See Comments)    Altered mental staus   Ilevro [Nepafenac] Other (See Comments)    Eye swelling Eye drops    Other Swelling    DOGS.  Congestion. FEATHERS.  Congestion  Can not take preservative free eye drops     Maxitrol [Neomycin-Polymyxin-Dexameth]     dry eyes    Metformin And Related     Shakiness    Augmentin [Amoxicillin-Pot Clavulanate] Itching and Rash   Molds & Smuts Other (See Comments)    Congestion.   Neomycin Rash   Neomycin-Bacitracin Zn-Polymyx Rash   Neomycin-Polymyxin-Hc Rash   Tape Dermatitis    Blisters   Tapentadol Nausea And Vomiting    Nucynta   Terbinafine Rash    Trouble swallowing   Tramadol Other (See Comments), Nausea Only and Nausea And Vomiting    dizziness     Past Medical History, Surgical history, Social history, and Family History were reviewed and updated.  Review of Systems: Review of Systems  Constitutional: Negative.   HENT: Negative.    Eyes: Negative.   Respiratory: Negative.    Cardiovascular: Negative.   Gastrointestinal: Negative.   Genitourinary: Negative.   Musculoskeletal: Negative.   Skin: Negative.   Neurological: Negative.   Endo/Heme/Allergies: Negative.   Psychiatric/Behavioral: Negative.       Physical Exam:  weight is 166 lb (75.3 kg). Her oral temperature is 98.1 F (36.7 C). Her blood pressure is 119/70 and her pulse is 81. Her respiration is 19 and oxygen saturation is 100%.   Physical Exam Vitals reviewed.  HENT:     Head: Normocephalic and atraumatic.  Eyes:     Pupils: Pupils are equal, round, and reactive to light.  Cardiovascular:     Rate and Rhythm: Normal rate and regular rhythm.     Heart sounds: Normal heart sounds.  Pulmonary:     Effort: Pulmonary effort is normal.     Breath sounds: Normal breath sounds.  Abdominal:     General: Bowel sounds are normal.     Palpations: Abdomen is soft.  Musculoskeletal:        General: No tenderness or deformity. Normal range of motion.     Cervical back: Normal range of motion.  Lymphadenopathy:     Cervical: No cervical adenopathy.  Skin:    General: Skin is warm and dry.     Findings: No erythema or rash.  Neurological:     Mental Status: She is alert and oriented to person, place, and time.  Psychiatric:        Behavior: Behavior normal.        Thought Content: Thought content normal.        Judgment: Judgment normal.    Lab Results  Component Value Date   WBC 9.1 03/04/2023   HGB 15.1 (H) 03/04/2023   HCT 44.8 03/04/2023   MCV 90.7 03/04/2023   PLT 171 03/04/2023     Chemistry      Component Value Date/Time   NA 137 03/04/2023 1503   NA 135 04/29/2017 1157   NA 138 10/29/2016 1021   K 3.8 03/04/2023 1503   K 3.7 04/29/2017  1157   K 4.2 10/29/2016 1021   CL 104 03/04/2023 1503   CL 102 04/29/2017 1157   CO2 25 03/04/2023 1503   CO2 27 04/29/2017 1157   CO2 24 10/29/2016 1021   BUN 16 03/04/2023 1503   BUN 9 04/29/2017 1157   BUN 17.3  10/29/2016 1021   CREATININE 1.00 03/04/2023 1503   CREATININE 0.9 04/29/2017 1157   CREATININE 0.9 10/29/2016 1021      Component Value Date/Time   CALCIUM 10.9 (H) 03/04/2023 1503   CALCIUM 9.2 04/29/2017 1157   CALCIUM 10.0 10/29/2016 1021   ALKPHOS 62 03/04/2023 1503   ALKPHOS 64 04/29/2017 1157   ALKPHOS 71 10/29/2016 1021   AST 33 03/04/2023 1503   AST 24 10/29/2016 1021   ALT 40 03/04/2023 1503   ALT 37 04/29/2017 1157   ALT 26 10/29/2016 1021   BILITOT 0.9 03/04/2023 1503   BILITOT 1.18 10/29/2016 1021       Impression and Plan: Ms. Kasza is 82 year old white female with history of synchronous bilateral breast cancer. Fortunately both breast cancers were node negative. They were ER positive and HER-2 negative.   It has now been  18 years since she was treated for the breast cancer.  I really have to believe that she is going to be cured since her cancers were node negative.  I hate that she had the tooth pulled.  I do not see a problem with her on the fluorouracil cream.  I think that the best thing that she can do for herself is to keep going to the gym.  This will help her bones.  We will plan to get her back in 6 months.     Josph Macho, MD 6/6/20243:53 PM

## 2023-03-05 ENCOUNTER — Other Ambulatory Visit: Payer: PPO

## 2023-03-12 ENCOUNTER — Ambulatory Visit
Admission: EM | Admit: 2023-03-12 | Discharge: 2023-03-12 | Disposition: A | Discharge status: Home / Self Care | Payer: PPO | Attending: Emergency Medicine | Admitting: Emergency Medicine

## 2023-03-12 DIAGNOSIS — Z20822 Contact with and (suspected) exposure to covid-19: Secondary | ICD-10-CM | POA: Diagnosis not present

## 2023-03-12 DIAGNOSIS — R051 Acute cough: Secondary | ICD-10-CM

## 2023-03-12 NOTE — ED Provider Notes (Signed)
Jacqueline Mata    CSN: [id number] Arrival date & time: 03/12/23  0802      History   Chief Complaint Chief Complaint  Patient presents with   Cough   Nasal Congestion   Fever    HPI Jacqueline Mata is a 82 y.o. female.   Patient presents with request for a COVID test.  She reports mild cough, congestion, low grade fever since yesterday.  She was exposed to someone with COVID.  She denies shortness of breath, chest pain, or other symptoms.  No OTC medications taken today.  Her medical history includes diabetes, CKD, breast CA.   The history is provided by the patient and medical records.    Past Medical History:  Diagnosis Date   Arthritis    breast cancer 2005   bilateral   Cataract 01/04/13 and 03/01/13   Complication of anesthesia    dizziness    Hyperlipidemia    hypothyroidism    Hypothyroidism    Personal history of chemotherapy    Personal history of radiation therapy    PONV (postoperative nausea and vomiting)    Pre-diabetes    Sleep apnea    mild diagnosis no cpap   Tinnitus     Patient Active Problem List   Diagnosis Date Noted   Sensorineural hearing loss (SNHL) of both ears 10/21/2022   Primary localized osteoarthritis of left hip 11/25/2021   Rosacea 11/10/2021   Chronic hip pain, left 11/10/2021   Chronic pain in left shoulder 05/06/2021   CKD stage 3 secondary to diabetes (HCC) 05/06/2021   Mixed stress and urge urinary incontinence 05/06/2021   Pain, dental 02/10/2021   Tear of left biceps muscle 01/08/2021   Preoperative evaluation to rule out surgical contraindication 11/04/2020   Primary osteoarthritis of right hip 07/20/2020   Pre-ulcerative corn or callous 07/09/2020   Elevated liver enzymes 04/16/2020   Short-term memory loss 07/31/2019   Educated about COVID-19 virus infection 01/30/2019   Osteopenia determined by x-ray 03/02/2018   Myalgia due to statin 01/02/2018   Stage 2 carcinoma of breast, ER+, unspecified laterality  (HCC) 04/29/2017   Onychomycosis of great toe 03/19/2016   Cervical radiculopathy due to degenerative joint disease of spine 03/19/2015   Acromioclavicular joint arthritis 02/19/2015   Rotator cuff dysfunction 01/08/2015   Shoulder pain, bilateral 12/21/2014   Sciatica of left side 10/17/2014   Encounter for Medicare annual wellness exam 05/22/2014   Basal cell carcinoma of leg 12/20/2013   Overweight (BMI 25.0-29.9) 08/08/2013   Balance problem due to vestibular dysfunction 05/01/2012   Status post total knee replacement 05/01/2012   Breast cancer in situ 11/09/2011   Acquired hypothyroidism 07/26/2011   Hyperlipidemia associated with type 2 diabetes mellitus (HCC) 07/23/2011    Past Surgical History:  Procedure Laterality Date   bilateral knee replacement s     BREAST BIOPSY Left 2005   positive   BREAST BIOPSY Right 2005   positive   BREAST LUMPECTOMY Left 2005   BREAST LUMPECTOMY Right 2005   gumm surgery      JOINT REPLACEMENT  2013   by Dr. Ernest Pine   righ tknee arthroscopy      TONSILLECTOMY     TOTAL HIP ARTHROPLASTY Right 11/12/2020   Procedure: RIGHT TOTAL HIP ARTHROPLASTY ANTERIOR APPROACH;  Surgeon: Marcene Corning, MD;  Location: [facility];  Service: Orthopedics;  Laterality: Right;   TOTAL HIP ARTHROPLASTY Left 11/25/2021   Procedure: LEFT TOTAL HIP ARTHROPLASTY ANTERIOR APPROACH;  Surgeon:  Marcene Corning, MD;  Location: [facility];  Service: Orthopedics;  Laterality: Left;    OB History   No obstetric history on file.      Home Medications    Prior to Admission medications   Medication Sig Start Date End Date Taking? Authorizing Provider  amoxicillin (AMOXIL) 500 MG capsule Take 2,000 mg by mouth See admin instructions. Dental procedures. Takes 2000mg  one hour prior to dental procedures.    [provider]  blood glucose meter kit and supplies Dispense based on patient and insurance preference. Use to check blood sugars one time daily. (ICD-10 E11.9).  11/04/21   Sherlene Shams, MD  Cholecalciferol (VITAMIN D) 50 MCG (2000 UT) tablet Take 2,000 Units by mouth daily.    [provider]  COLLAGEN PO Take 1 Scoop by mouth every other day. With Probiotic    [provider]  ezetimibe (ZETIA) 10 MG tablet TAKE 1 TABLET BY MOUTH ONCE DAILY 02/02/23   Sherlene Shams, MD  fluconazole (DIFLUCAN) 200 MG tablet Take 200 mg by mouth once a week. 10/22/22   [provider]  folic acid (FOLVITE) 800 MCG tablet Take 800 mcg by mouth daily.    [provider]  glucose blood (ONETOUCH VERIO) test strip Use to check blood sugar once daily. 07/30/22   Sherlene Shams, MD  glucose blood (ONETOUCH VERIO) test strip Use to check blood sugars twice daily. 10/21/22   Sherlene Shams, MD  Ivermectin 1 % CREA Apply 1 application  topically every evening.    [provider]  Lancets (ONETOUCH DELICA PLUS LANCET33G) MISC USE TO CHECK BLOOD SUGAR ONCE DAILY 05/02/22   Worthy Rancher B, FNP  Lancets [facility] DELICA PLUS [place]) MISC Use to check blood sugars twice daily. 10/21/22   Sherlene Shams, MD  levothyroxine (SYNTHROID) 88 MCG tablet TAKE ONE TABLET EVERY DAY BEFORE BREAKFAST 02/26/23   Sherlene Shams, MD  loratadine (CLARITIN) 10 MG tablet Take 10 mg by mouth daily.    [provider]  OVER THE COUNTER MEDICATION Place 2 sprays into the nose daily. Sovereign Silver Nasal Spray (Immune Support)    [provider]  Propylene Glycol (SYSTANE BALANCE) 0.6 % SOLN Place 1 drop into both eyes daily as needed (dry eyes).    [provider]  vitamin B-12 (CYANOCOBALAMIN) 1000 MCG tablet Take 1,000 mcg by mouth daily.    [provider]    Family History Family History  Problem Relation Age of Onset   Cancer Mother 33       ovarian   Cancer Sister        lung, former tobacco   Breast cancer Maternal Grandmother    Cancer Brother 28       lung, thyroid, melanoma   Diabetes Son     Social  History Social History   Tobacco Use   Smoking status: Former    Packs/day: 1.00    Years: 19.00    Additional pack years: 0.00    Total pack years: 19.00    Types: Cigarettes    Start date: 01/02/1960    Quit date: 07/22/1978    Years since quitting: 44.6   Smokeless tobacco: Never   Tobacco comments:    quit 36 years ago  Vaping Use   Vaping Use: Never used  Substance Use Topics   Alcohol use: Not Currently    Comment: rare   Drug use: No     Allergies  Codeine, Ilevro [nepafenac], Other, Maxitrol [neomycin-polymyxin-dexameth], Metformin and related, Augmentin [amoxicillin-pot clavulanate], Molds & smuts, Neomycin, Neomycin-bacitracin zn-polymyx, Neomycin-polymyxin-hc, Tape, Tapentadol, Terbinafine, and Tramadol   Review of Systems Review of Systems  Constitutional:  Positive for fever. Negative for chills.  HENT:  Positive for congestion. Negative for ear pain and sore throat.   Respiratory:  Positive for cough. Negative for shortness of breath.   Cardiovascular:  Negative for chest pain and palpitations.     Physical Exam Triage Vital Signs ED Triage Vitals  Enc Vitals Group     BP      Pulse      Resp      Temp      Temp src      SpO2      Weight      Height      Head Circumference      Peak Flow      Pain Score      Pain Loc      Pain Edu?      Excl. in GC?    No data found.  Updated Vital Signs BP 134/85   Pulse 99   Temp 98.5 F (36.9 C)   Resp 18   SpO2 96%   Visual Acuity Right Eye Distance:   Left Eye Distance:   Bilateral Distance:    Right Eye Near:   Left Eye Near:    Bilateral Near:     Physical Exam Vitals and nursing note reviewed.  Constitutional:      General: She is not in acute distress.    Appearance: Normal appearance. She is well-developed. She is not ill-appearing.  HENT:     Right Ear: Tympanic membrane normal.     Left Ear: Tympanic membrane normal.     Nose: Nose normal.     Mouth/Throat:     Mouth:  Mucous membranes are moist.     Pharynx: Oropharynx is clear.  Cardiovascular:     Rate and Rhythm: Normal rate and regular rhythm.     Heart sounds: Normal heart sounds.  Pulmonary:     Effort: Pulmonary effort is normal. No respiratory distress.     Breath sounds: Normal breath sounds.  Musculoskeletal:     Cervical back: Neck supple.  Skin:    General: Skin is warm and dry.  Neurological:     Mental Status: She is alert.  Psychiatric:        Mood and Affect: Mood normal.        Behavior: Behavior normal.      UC Treatments / Results  Labs (all labs ordered are listed, but only abnormal results are displayed) Labs Reviewed  SARS CORONAVIRUS 2 (TAT 6-24 HRS)    EKG   Radiology No results found.  Procedures Procedures (including critical care time)  Medications Ordered in UC Medications - No data to display  Initial Impression / Assessment and Plan / UC Course  I have reviewed the triage vital signs and the nursing notes.  Pertinent labs & imaging results that were available during my care of the patient were reviewed by me and considered in my medical decision making (see chart for details).    Cough, Exposure to COVID.  Patient presents with request for COVID test after being exposed to someone with COVID.  She developed mld symptoms yesterday.  COVID pending.  If COVID positive, recommend treatment with Paxlovid.  Last GFR 57 on 03/04/2023.  Discussed symptomatic treatment including Tylenol, rest, hydration.  Instructed patient to follow up with her PCP if symptoms are not improving.  She agrees to plan of care.   Final Clinical Impressions(s) / UC Diagnoses   Final diagnoses:  Acute cough  Exposure to COVID-19 virus     Discharge Instructions      Your COVID test is pending.    Take Tylenol as needed for fever or discomfort.  Rest and keep yourself hydrated.    Follow-up with your primary care provider if your symptoms are not improving.          ED Prescriptions   None    PDMP not reviewed this encounter.   Mickie Bail, NP 03/12/23 [phone]

## 2023-03-12 NOTE — ED Triage Notes (Signed)
Patient to Urgent Care with complaints of cough/ nasal congestion and drainage/ post-nasal drip/ fatigue. Reports symptoms started yesterday. Nasal symptoms started last week.   Requests Covid test. Reports several positive contacts. Has been taking mucinex.

## 2023-03-12 NOTE — Discharge Instructions (Addendum)
Your COVID test is pending.    Take Tylenol as needed for fever or discomfort.  Rest and keep yourself hydrated.    Follow-up with your primary care provider if your symptoms are not improving.     

## 2023-03-13 LAB — SARS CORONAVIRUS 2 (TAT 6-24 HRS): SARS Coronavirus 2: POSITIVE — AB

## 2023-03-13 MED ORDER — NIRMATRELVIR/RITONAVIR (PAXLOVID)TABLET: 3.0000 | ORAL_TABLET | Freq: Two times a day (BID) | ORAL | 0 refills | Status: AC

## 2023-03-13 NOTE — Telephone Encounter (Signed)
Telephone call to patient to discuss positive COVID result.  Treating with Paxlovid.  GFR 57 on 03/04/2023.  I discussed the side effects of Paxlovid, including dysgeusia, diarrhea, myalgias, hypertension.  I also discussed the possibility of rebound COVID.  Instructed patient to notify her PCP that she is COVID positive and taking Paxlovid.  ED precautions discussed if she develops shortness of breath or other concerning symptoms.  Instructed patient to follow CDC quarantine guidelines.  Patient agrees to plan of care.

## 2023-03-15 ENCOUNTER — Encounter: Payer: Self-pay | Admitting: Internal Medicine

## 2023-03-15 NOTE — Telephone Encounter (Signed)
FYI

## 2023-03-18 DIAGNOSIS — M542 Cervicalgia: Secondary | ICD-10-CM | POA: Diagnosis not present

## 2023-03-18 DIAGNOSIS — M62830 Muscle spasm of back: Secondary | ICD-10-CM | POA: Diagnosis not present

## 2023-03-18 DIAGNOSIS — M9903 Segmental and somatic dysfunction of lumbar region: Secondary | ICD-10-CM | POA: Diagnosis not present

## 2023-03-18 DIAGNOSIS — M546 Pain in thoracic spine: Secondary | ICD-10-CM | POA: Diagnosis not present

## 2023-04-22 DIAGNOSIS — M9903 Segmental and somatic dysfunction of lumbar region: Secondary | ICD-10-CM | POA: Diagnosis not present

## 2023-04-22 DIAGNOSIS — M546 Pain in thoracic spine: Secondary | ICD-10-CM | POA: Diagnosis not present

## 2023-04-22 DIAGNOSIS — M542 Cervicalgia: Secondary | ICD-10-CM | POA: Diagnosis not present

## 2023-04-22 DIAGNOSIS — M62830 Muscle spasm of back: Secondary | ICD-10-CM | POA: Diagnosis not present

## 2023-05-03 ENCOUNTER — Encounter: Payer: Self-pay | Admitting: Internal Medicine

## 2023-05-03 ENCOUNTER — Ambulatory Visit (INDEPENDENT_AMBULATORY_CARE_PROVIDER_SITE_OTHER): Payer: PPO | Admitting: Internal Medicine

## 2023-05-03 VITALS — BP 126/76 | HR 91 | Ht 65.0 in | Wt 166.0 lb

## 2023-05-03 DIAGNOSIS — E039 Hypothyroidism, unspecified: Secondary | ICD-10-CM

## 2023-05-03 DIAGNOSIS — N183 Chronic kidney disease, stage 3 unspecified: Secondary | ICD-10-CM

## 2023-05-03 DIAGNOSIS — N3941 Urge incontinence: Secondary | ICD-10-CM

## 2023-05-03 DIAGNOSIS — R748 Abnormal levels of other serum enzymes: Secondary | ICD-10-CM

## 2023-05-03 DIAGNOSIS — E785 Hyperlipidemia, unspecified: Secondary | ICD-10-CM

## 2023-05-03 DIAGNOSIS — Z7189 Other specified counseling: Secondary | ICD-10-CM

## 2023-05-03 DIAGNOSIS — Z1231 Encounter for screening mammogram for malignant neoplasm of breast: Secondary | ICD-10-CM

## 2023-05-03 DIAGNOSIS — E1169 Type 2 diabetes mellitus with other specified complication: Secondary | ICD-10-CM

## 2023-05-03 DIAGNOSIS — E1122 Type 2 diabetes mellitus with diabetic chronic kidney disease: Secondary | ICD-10-CM | POA: Diagnosis not present

## 2023-05-03 DIAGNOSIS — E119 Type 2 diabetes mellitus without complications: Secondary | ICD-10-CM

## 2023-05-03 LAB — POCT GLYCOSYLATED HEMOGLOBIN (HGB A1C): Hemoglobin A1C: 6.5 % — AB (ref 4.0–5.6)

## 2023-05-03 NOTE — Assessment & Plan Note (Signed)
She is tolerating zetia .  Return in 3 weeeks for LFT. Regarding her elevated A1c,  reviewed fasting BS today which have been averaging 130 to 150.   Advised to check pre and post dinnertime BS for the next two weeks and submit for evaluation

## 2023-05-03 NOTE — Progress Notes (Unsigned)
Subjective:  Patient ID: Jacqueline Mata, female    DOB: March 05, 1941  Age: 82 y.o. MRN: [id number]  CC: The primary encounter diagnosis was Encounter for screening mammogram for malignant neoplasm of breast. Diagnoses of Hyperlipidemia associated with type 2 diabetes mellitus (HCC), Acquired hypothyroidism, and Diabetes mellitus type 2, diet-controlled (HCC) were also pertinent to this visit.   HPI Jacqueline Mata presents for  Chief Complaint  Patient presents with   Medical Management of Chronic Issues    6 month follow up    1) Recovered from COVID infection in mid June .  Was given Paxlovid by Urgent Care   2) type 2 DM  last A1c 7.4  defers medication, taking zetia.  Blood sugars have been 120 to 130 fasting  . Home A1c is 6.3    3) seeing isenstein for recurrent  mycosis fungoides patch involving the left upper shin . Has been using a steroid cream    4) urge incontinence   Outpatient Medications Prior to Visit  Medication Sig Dispense Refill   amoxicillin (AMOXIL) 500 MG capsule Take 2,000 mg by mouth See admin instructions. Dental procedures. Takes 2000mg  one hour prior to dental procedures.     blood glucose meter kit and supplies Dispense based on patient and insurance preference. Use to check blood sugars one time daily. (ICD-10 E11.9). 1 each 0   Cholecalciferol (VITAMIN D) 50 MCG (2000 UT) tablet Take 2,000 Units by mouth daily.     COLLAGEN PO Take 1 Scoop by mouth every other day. With Probiotic     ezetimibe (ZETIA) 10 MG tablet TAKE 1 TABLET BY MOUTH ONCE DAILY 90 tablet 1   fluconazole (DIFLUCAN) 200 MG tablet Take 200 mg by mouth once a week.     folic acid (FOLVITE) 800 MCG tablet Take 800 mcg by mouth daily.     glucose blood (ONETOUCH VERIO) test strip Use to check blood sugar once daily. 100 strip 3   glucose blood (ONETOUCH VERIO) test strip Use to check blood sugars twice daily. 100 each 12   Ivermectin 1 % CREA Apply 1 application  topically every  evening.     Lancets (ONETOUCH DELICA PLUS LANCET33G) MISC USE TO CHECK BLOOD SUGAR ONCE DAILY 100 each 0   Lancets (ONETOUCH DELICA PLUS LANCET33G) MISC Use to check blood sugars twice daily. 100 each 3   levothyroxine (SYNTHROID) 88 MCG tablet TAKE ONE TABLET EVERY DAY BEFORE BREAKFAST 90 tablet 1   loratadine (CLARITIN) 10 MG tablet Take 10 mg by mouth daily.     OVER THE COUNTER MEDICATION Place 2 sprays into the nose daily. Sovereign Silver Nasal Spray (Immune Support)     Propylene Glycol (SYSTANE BALANCE) 0.6 % SOLN Place 1 drop into both eyes daily as needed (dry eyes).     vitamin B-12 (CYANOCOBALAMIN) 1000 MCG tablet Take 1,000 mcg by mouth daily.     No facility-administered medications prior to visit.    Review of Systems;  Patient denies headache, fevers, malaise, unintentional weight loss, skin rash, eye pain, sinus congestion and sinus pain, sore throat, dysphagia,  hemoptysis , cough, dyspnea, wheezing, chest pain, palpitations, orthopnea, edema, abdominal pain, nausea, melena, diarrhea, constipation, flank pain, dysuria, hematuria, urinary  Frequency, nocturia, numbness, tingling, seizures,  Focal weakness, Loss of consciousness,  Tremor, insomnia, depression, anxiety, and suicidal ideation.      Objective:  BP 126/76   Pulse 91   Ht 5\' 5"  (1.651 m)   Wt 166  lb (75.3 kg)   SpO2 95%   BMI 27.62 kg/m   BP Readings from Last 3 Encounters:  05/03/23 126/76  03/12/23 134/85  03/04/23 119/70    Wt Readings from Last 3 Encounters:  05/03/23 166 lb (75.3 kg)  03/04/23 166 lb (75.3 kg)  11/02/22 171 lb 3.2 oz (77.7 kg)    Physical Exam  Lab Results  Component Value Date   HGBA1C 7.4 (H) 10/21/2022   HGBA1C 6.8 (H) 11/06/2021   HGBA1C 6.8 (H) 05/02/2021    Lab Results  Component Value Date   CREATININE 1.00 03/04/2023   CREATININE 1.01 01/12/2023   CREATININE 1.03 11/27/2022    Lab Results  Component Value Date   WBC 9.1 03/04/2023   HGB 15.1 (H)  03/04/2023   HCT 44.8 03/04/2023   PLT 171 03/04/2023   GLUCOSE 99 03/04/2023   CHOL 248 (H) 10/21/2022   TRIG 164.0 (H) 10/21/2022   HDL 84.00 10/21/2022   LDLDIRECT 137.0 10/21/2022   LDLCALC 131 (H) 10/21/2022   ALT 40 03/04/2023   AST 33 03/04/2023   NA 137 03/04/2023   K 3.8 03/04/2023   CL 104 03/04/2023   CREATININE 1.00 03/04/2023   BUN 16 03/04/2023   CO2 25 03/04/2023   TSH 0.96 10/21/2022   INR 1.1 11/05/2020   HGBA1C 7.4 (H) 10/21/2022   MICROALBUR <0.7 10/21/2022    No results found.  Assessment & Plan:  .Encounter for screening mammogram for malignant neoplasm of breast -     3D Screening Mammogram, Left and Right; Future  Hyperlipidemia associated with type 2 diabetes mellitus (HCC) -     Lipid panel -     LDL cholesterol, direct  Acquired hypothyroidism -     TSH  Diabetes mellitus type 2, diet-controlled (HCC) -     Hemoglobin A1c -     Comprehensive metabolic panel     I provided 30 minutes of face-to-face time during this encounter reviewing patient's last visit with me, patient's  most recent visit with cardiology,  nephrology,  and neurology,  recent surgical and non surgical procedures, previous  labs and imaging studies, counseling on currently addressed issues,  and post visit ordering to diagnostics and therapeutics .   Follow-up: No follow-ups on file.   Sherlene Shams, MD

## 2023-05-03 NOTE — Patient Instructions (Addendum)
Do not use any steroid creams on  the leg rash because it may confuse the biopsy results that you have planned (metronidazole is NOT a steroid)  Your diabetes remains under excellent control  now!   If you start having lows, please add a snack   You can return to have  your cholesterol, urine and other labs done .

## 2023-05-04 ENCOUNTER — Other Ambulatory Visit: Payer: PPO

## 2023-05-04 NOTE — Assessment & Plan Note (Signed)
GFR remains < 60 ml/min but > 45  on recent CMP.  She was referred to nephrology but cancelled Sept initial consult and saw a [facility] nephrologist Dr Cherly Hensen instead.  Told she could use NSAIDs infrequently for neck pain .  Currently using them every 4 days,  Ok to increase to every 3 days .  Repeat assessment needed after using Paxlovid in June.    Lab Results  Component Value Date   CREATININE 1.00 03/04/2023   Lab Results  Component Value Date   MICROALBUR <0.7 10/21/2022   MICROALBUR <0.7 11/06/2021

## 2023-05-04 NOTE — Assessment & Plan Note (Signed)
Repeat enzymes are normal.  Retesting needed after using Paxlovid

## 2023-05-04 NOTE — Assessment & Plan Note (Signed)
Advised in the future to avoid the antivirals given their lack of efficacy and safety issues. Supportive care outlined.

## 2023-05-06 ENCOUNTER — Other Ambulatory Visit: Payer: PPO

## 2023-05-06 DIAGNOSIS — E1169 Type 2 diabetes mellitus with other specified complication: Secondary | ICD-10-CM

## 2023-05-06 DIAGNOSIS — E785 Hyperlipidemia, unspecified: Secondary | ICD-10-CM

## 2023-05-06 DIAGNOSIS — N3941 Urge incontinence: Secondary | ICD-10-CM | POA: Diagnosis not present

## 2023-05-06 DIAGNOSIS — E119 Type 2 diabetes mellitus without complications: Secondary | ICD-10-CM

## 2023-05-06 DIAGNOSIS — R7401 Elevation of levels of liver transaminase levels: Secondary | ICD-10-CM

## 2023-05-06 LAB — COMPREHENSIVE METABOLIC PANEL
ALT: 41 U/L — ABNORMAL HIGH (ref 0–35)
AST: 35 U/L (ref 0–37)
Albumin: 4.6 g/dL (ref 3.5–5.2)
Alkaline Phosphatase: 56 U/L (ref 39–117)
BUN: 17 mg/dL (ref 6–23)
CO2: 22 mEq/L (ref 19–32)
Calcium: 9.6 mg/dL (ref 8.4–10.5)
Chloride: 106 mEq/L (ref 96–112)
Creatinine, Ser: 1 mg/dL (ref 0.40–1.20)
GFR: 52.9 mL/min — ABNORMAL LOW (ref >60.00)
Glucose, Bld: 161 mg/dL — ABNORMAL HIGH (ref 70–99)
Potassium: 4.2 mEq/L (ref 3.5–5.1)
Sodium: 138 mEq/L (ref 135–145)
Total Bilirubin: 1 mg/dL (ref 0.2–1.2)
Total Protein: 7 g/dL (ref 6.0–8.3)

## 2023-05-06 LAB — URINALYSIS, ROUTINE W REFLEX MICROSCOPIC
Bilirubin Urine: NEGATIVE
Ketones, ur: NEGATIVE
Leukocytes,Ua: NEGATIVE
Nitrite: NEGATIVE
Specific Gravity, Urine: 1.01 (ref 1.000–1.030)
Total Protein, Urine: NEGATIVE
Urine Glucose: NEGATIVE
Urobilinogen, UA: 0.2 (ref 0.0–1.0)
pH: 6 (ref 5.0–8.0)

## 2023-05-06 LAB — LIPID PANEL
Cholesterol: 206 mg/dL — ABNORMAL HIGH (ref 0–200)
HDL: 84.2 mg/dL (ref >39.00)
LDL Cholesterol: 103 mg/dL — ABNORMAL HIGH (ref 0–99)
NonHDL: 121.61
Total CHOL/HDL Ratio: 2
Triglycerides: 92 mg/dL (ref 0.0–149.0)
VLDL: 18.4 mg/dL (ref 0.0–40.0)

## 2023-05-06 LAB — LDL CHOLESTEROL, DIRECT: Direct LDL: 109 mg/dL

## 2023-05-08 ENCOUNTER — Encounter: Payer: Self-pay | Admitting: Internal Medicine

## 2023-05-08 NOTE — Addendum Note (Signed)
Addended by: Sherlene Shams on: 05/08/2023 09:48 AM   Modules accepted: Orders

## 2023-05-19 ENCOUNTER — Encounter: Payer: Self-pay | Admitting: Internal Medicine

## 2023-05-19 DIAGNOSIS — L821 Other seborrheic keratosis: Secondary | ICD-10-CM | POA: Diagnosis not present

## 2023-05-19 DIAGNOSIS — B354 Tinea corporis: Secondary | ICD-10-CM | POA: Diagnosis not present

## 2023-05-19 DIAGNOSIS — L810 Postinflammatory hyperpigmentation: Secondary | ICD-10-CM | POA: Diagnosis not present

## 2023-05-27 DIAGNOSIS — M546 Pain in thoracic spine: Secondary | ICD-10-CM | POA: Diagnosis not present

## 2023-05-27 DIAGNOSIS — M9903 Segmental and somatic dysfunction of lumbar region: Secondary | ICD-10-CM | POA: Diagnosis not present

## 2023-05-27 DIAGNOSIS — M62830 Muscle spasm of back: Secondary | ICD-10-CM | POA: Diagnosis not present

## 2023-05-27 DIAGNOSIS — M542 Cervicalgia: Secondary | ICD-10-CM | POA: Diagnosis not present

## 2023-06-03 ENCOUNTER — Encounter: Payer: Self-pay | Admitting: Internal Medicine

## 2023-06-03 NOTE — Telephone Encounter (Signed)
Patient called back and I have scheduled an appointment for her tomorrow with Kara Dies, NP.

## 2023-06-03 NOTE — Telephone Encounter (Signed)
noted 

## 2023-06-04 ENCOUNTER — Ambulatory Visit (INDEPENDENT_AMBULATORY_CARE_PROVIDER_SITE_OTHER): Payer: PPO | Admitting: Nurse Practitioner

## 2023-06-04 ENCOUNTER — Encounter: Payer: Self-pay | Admitting: Nurse Practitioner

## 2023-06-04 ENCOUNTER — Other Ambulatory Visit: Payer: PPO

## 2023-06-04 ENCOUNTER — Other Ambulatory Visit (INDEPENDENT_AMBULATORY_CARE_PROVIDER_SITE_OTHER): Payer: PPO

## 2023-06-04 VITALS — BP 118/68 | HR 86 | Temp 98.4°F | Ht 65.0 in | Wt 167.0 lb

## 2023-06-04 DIAGNOSIS — R7401 Elevation of levels of liver transaminase levels: Secondary | ICD-10-CM

## 2023-06-04 DIAGNOSIS — J302 Other seasonal allergic rhinitis: Secondary | ICD-10-CM

## 2023-06-04 LAB — HEPATIC FUNCTION PANEL
ALT: 23 U/L (ref 0–35)
AST: 19 U/L (ref 0–37)
Albumin: 4.3 g/dL (ref 3.5–5.2)
Alkaline Phosphatase: 54 U/L (ref 39–117)
Bilirubin, Direct: 0.2 mg/dL (ref 0.0–0.3)
Total Bilirubin: 0.9 mg/dL (ref 0.2–1.2)
Total Protein: 7.3 g/dL (ref 6.0–8.3)

## 2023-06-04 MED ORDER — OLOPATADINE HCL 0.1 % OP SOLN: 1.0000 [drp] | Freq: Two times a day (BID) | OPHTHALMIC | 0 refills | Status: AC

## 2023-06-04 MED ORDER — FEXOFENADINE HCL 60 MG PO TABS: 60.0000 mg | ORAL_TABLET | Freq: Two times a day (BID) | ORAL | 1 refills | Status: DC

## 2023-06-04 NOTE — Patient Instructions (Signed)
Stop Claritin. Take fexofenadine and Pataday eye drops. Use melatonin for sleep.

## 2023-06-04 NOTE — Progress Notes (Signed)
Established Patient Office Visit  Subjective:  Patient ID: Jacqueline Mata, female    DOB: [birth year]  Age: 82 y.o. MRN: [id number]  CC:  Chief Complaint  Patient presents with   Acute Visit    Eyes itchy & burning sunlight is almost blinding It is hard to drive sunglasses not helpful Claritin not helpful    HPI  Jacqueline Mata presents for itchy, burning and watery eyes started last week ago and worsen in last 2 days. She has been taking Claritin without improvement. She gets it every season.    HPI   Past Medical History:  Diagnosis Date   Arthritis    breast cancer 2005   bilateral   Cataract 01/04/13 and 03/01/13   Complication of anesthesia    dizziness    Hyperlipidemia    hypothyroidism    Hypothyroidism    Personal history of chemotherapy    Personal history of radiation therapy    PONV (postoperative nausea and vomiting)    Pre-diabetes    Sleep apnea    mild diagnosis no cpap   Tinnitus     Past Surgical History:  Procedure Laterality Date   bilateral knee replacement s     BREAST BIOPSY Left 2005   positive   BREAST BIOPSY Right 2005   positive   BREAST LUMPECTOMY Left 2005   BREAST LUMPECTOMY Right 2005   gumm surgery      JOINT REPLACEMENT  2013   by Dr. Ernest Pine   righ tknee arthroscopy      TONSILLECTOMY     TOTAL HIP ARTHROPLASTY Right 11/12/2020   Procedure: RIGHT TOTAL HIP ARTHROPLASTY ANTERIOR APPROACH;  Surgeon: Marcene Corning, MD;  Location: [facility];  Service: Orthopedics;  Laterality: Right;   TOTAL HIP ARTHROPLASTY Left 11/25/2021   Procedure: LEFT TOTAL HIP ARTHROPLASTY ANTERIOR APPROACH;  Surgeon: Marcene Corning, MD;  Location: [facility];  Service: Orthopedics;  Laterality: Left;    Family History  Problem Relation Age of Onset   Cancer Mother 71       ovarian   Cancer Sister        lung, former tobacco   Breast cancer Maternal Grandmother    Cancer Brother 66       lung, thyroid, melanoma   Diabetes Son     Social History    Socioeconomic History   Marital status: Divorced    Spouse name: Not on file   Number of children: Not on file   Years of education: Not on file   Highest education level: Not on file  Occupational History   Not on file  Tobacco Use   Smoking status: Former    Current packs/day: 0.00    Average packs/day: 1 pack/day for 19.0 years (19.0 ttl pk-yrs)    Types: Cigarettes    Start date: 01/02/1960    Quit date: 07/22/1978    Years since quitting: 44.9   Smokeless tobacco: Never   Tobacco comments:    quit 36 years ago  Vaping Use   Vaping status: Never Used  Substance and Sexual Activity   Alcohol use: Not Currently    Comment: rare   Drug use: No   Sexual activity: Never  Other Topics Concern   Not on file  Social History Narrative   Not on file   Social Determinants of Health   Financial Resource Strain: Low Risk  (07/14/2022)   Overall Financial Resource Strain (CARDIA)    Difficulty of Paying Living Expenses:  Not hard at all  Food Insecurity: No Food Insecurity (07/14/2022)   Hunger Vital Sign    Worried About Running Out of Food in the Last Year: Never true    Ran Out of Food in the Last Year: Never true  Transportation Needs: No Transportation Needs (07/14/2022)   PRAPARE - Administrator, Civil Service (Medical): No    Lack of Transportation (Non-Medical): No  Physical Activity: Sufficiently Active (07/14/2022)   Exercise Vital Sign    Days of Exercise per Week: 3 days    Minutes of Exercise per Session: 120 min  Stress: No Stress Concern Present (07/14/2022)   [employer] of Occupational Health - Occupational Stress Questionnaire    Feeling of Stress : Not at all  Social Connections: Unknown (07/14/2022)   Social Connection and Isolation Panel [NHANES]    Frequency of Communication with Friends and Family: More than three times a week    Frequency of Social Gatherings with Friends and Family: More than three times a week    Attends  Religious Services: Not on file    Active Member of Clubs or Organizations: Not on file    Attends Banker Meetings: Not on file    Marital Status: Not on file  Intimate Partner Violence: Not At Risk (07/14/2022)   Humiliation, Afraid, Rape, and Kick questionnaire    Fear of Current or Ex-Partner: No    Emotionally Abused: No    Physically Abused: No    Sexually Abused: No     Outpatient Medications Prior to Visit  Medication Sig Dispense Refill   amoxicillin (AMOXIL) 500 MG capsule Take 2,000 mg by mouth See admin instructions. Dental procedures. Takes 2000mg  one hour prior to dental procedures.     blood glucose meter kit and supplies Dispense based on patient and insurance preference. Use to check blood sugars one time daily. (ICD-10 E11.9). 1 each 0   Cholecalciferol (VITAMIN D) 50 MCG (2000 UT) tablet Take 2,000 Units by mouth daily.     COLLAGEN PO Take 1 Scoop by mouth every other day. With Probiotic     ezetimibe (ZETIA) 10 MG tablet TAKE 1 TABLET BY MOUTH ONCE DAILY 90 tablet 1   fluconazole (DIFLUCAN) 200 MG tablet Take 200 mg by mouth once a week.     folic acid (FOLVITE) 800 MCG tablet Take 800 mcg by mouth daily.     glucose blood (ONETOUCH VERIO) test strip Use to check blood sugar once daily. 100 strip 3   glucose blood (ONETOUCH VERIO) test strip Use to check blood sugars twice daily. 100 each 12   Ivermectin 1 % CREA Apply 1 application  topically every evening.     Lancets (ONETOUCH DELICA PLUS LANCET33G) MISC USE TO CHECK BLOOD SUGAR ONCE DAILY 100 each 0   Lancets (ONETOUCH DELICA PLUS LANCET33G) MISC Use to check blood sugars twice daily. 100 each 3   levothyroxine (SYNTHROID) 88 MCG tablet TAKE ONE TABLET EVERY DAY BEFORE BREAKFAST 90 tablet 1   OVER THE COUNTER MEDICATION Place 2 sprays into the nose daily. Sovereign Silver Nasal Spray (Immune Support)     Propylene Glycol (SYSTANE BALANCE) 0.6 % SOLN Place 1 drop into both eyes daily as needed (dry  eyes).     vitamin B-12 (CYANOCOBALAMIN) 1000 MCG tablet Take 1,000 mcg by mouth daily.     loratadine (CLARITIN) 10 MG tablet Take 10 mg by mouth daily.     No facility-administered medications prior to  visit.    Allergies  Allergen Reactions   Codeine Shortness Of Breath and Other (See Comments)    Altered mental staus   Ilevro [Nepafenac] Other (See Comments)    Eye swelling Eye drops    Other Swelling    DOGS.  Congestion. FEATHERS.  Congestion  Can not take preservative free eye drops     Maxitrol [Neomycin-Polymyxin-Dexameth]     dry eyes    Metformin And Related     Shakiness    Augmentin [Amoxicillin-Pot Clavulanate] Itching and Rash   Molds & Smuts Other (See Comments)    Congestion.   Neomycin Rash   Neomycin-Bacitracin Zn-Polymyx Rash   Neomycin-Polymyxin-Hc Rash   Tape Dermatitis    Blisters   Tapentadol Nausea And Vomiting    Nucynta   Terbinafine Rash    Trouble swallowing   Tramadol Other (See Comments), Nausea Only and Nausea And Vomiting    dizziness    ROS Review of Systems Negative unless indicated in HPI.    Objective:    Physical Exam Constitutional:      Appearance: Normal appearance.  HENT:     Right Ear: Tympanic membrane normal.     Left Ear: Tympanic membrane normal.     Nose:     Right Turbinates: Swollen.     Left Turbinates: Swollen.     Mouth/Throat:     Mouth: Mucous membranes are moist.  Eyes:     Pupils: Pupils are equal, round, and reactive to light.  Cardiovascular:     Rate and Rhythm: Normal rate and regular rhythm.     Pulses: Normal pulses.     Heart sounds: Normal heart sounds.  Pulmonary:     Effort: Pulmonary effort is normal.     Breath sounds: Normal breath sounds.  Musculoskeletal:     Cervical back: Normal range of motion.  Neurological:     General: No focal deficit present.     Mental Status: She is alert. Mental status is at baseline.  Psychiatric:        Mood and Affect: Mood normal.         Behavior: Behavior normal.        Thought Content: Thought content normal.        Judgment: Judgment normal.     BP 118/68   Pulse 86   Temp 98.4 F (36.9 C)   Ht 5\' 5"  (1.651 m)   Wt 167 lb (75.8 kg)   SpO2 98%   BMI 27.79 kg/m  Wt Readings from Last 3 Encounters:  06/04/23 167 lb (75.8 kg)  05/03/23 166 lb (75.3 kg)  03/04/23 166 lb (75.3 kg)     Health Maintenance  Topic Date Due   Medicare Annual Wellness (AWV)  07/15/2023   INFLUENZA VACCINE  12/27/2023 (Originally 04/29/2023)   Diabetic kidney evaluation - Urine ACR  10/22/2023   FOOT EXAM  10/22/2023   HEMOGLOBIN A1C  11/03/2023   OPHTHALMOLOGY EXAM  02/08/2024   Diabetic kidney evaluation - eGFR measurement  05/05/2024   MAMMOGRAM  06/09/2024   DTaP/Tdap/Td (3 - Td or Tdap) 10/28/2032   Pneumonia Vaccine 8+ Years old  Completed   DEXA SCAN  Completed   Zoster Vaccines- Shingrix  Completed   HPV VACCINES  Aged Out   COVID-19 Vaccine  Discontinued    There are no preventive care reminders to display for this patient.  Lab Results  Component Value Date   TSH 0.96 10/21/2022   Lab Results  Component Value Date   WBC 9.1 03/04/2023   HGB 15.1 (H) 03/04/2023   HCT 44.8 03/04/2023   MCV 90.7 03/04/2023   PLT 171 03/04/2023   Lab Results  Component Value Date   NA 138 05/06/2023   K 4.2 05/06/2023   CHLORIDE 106 10/29/2016   CO2 22 05/06/2023   GLUCOSE 161 (H) 05/06/2023   BUN 17 05/06/2023   CREATININE 1.00 05/06/2023   BILITOT 0.9 06/04/2023   ALKPHOS 54 06/04/2023   AST 19 06/04/2023   ALT 23 06/04/2023   PROT 7.3 06/04/2023   ALBUMIN 4.3 06/04/2023   CALCIUM 9.6 05/06/2023   ANIONGAP 8 03/04/2023   EGFR 60 (L) 10/29/2016   GFR 52.90 (L) 05/06/2023   Lab Results  Component Value Date   CHOL 206 (H) 05/06/2023   Lab Results  Component Value Date   HDL 84.20 05/06/2023   Lab Results  Component Value Date   LDLCALC 103 (H) 05/06/2023   Lab Results  Component Value Date   TRIG 92.0  05/06/2023   Lab Results  Component Value Date   CHOLHDL 2 05/06/2023   Lab Results  Component Value Date   HGBA1C 6.5 (A) 05/03/2023      Assessment & Plan:  Seasonal allergies Assessment & Plan: Started on fexofenadine and Pataday eyedrops. Advised patient to stop using Claritin. If symptoms not improving call the office.   Other orders -     Fexofenadine HCl; Take 1 tablet (60 mg total) by mouth 2 (two) times daily.  Dispense: 30 tablet; Refill: 1 -     Olopatadine HCl; Place 1 drop into both eyes 2 (two) times daily for 5 days.  Dispense: 1 mL; Refill: 0    Follow-up: No follow-ups on file.   Kara Dies, NP

## 2023-06-10 ENCOUNTER — Ambulatory Visit
Admission: RE | Admit: 2023-06-10 | Discharge: 2023-06-10 | Disposition: A | Discharge status: Home / Self Care | Payer: PPO | Source: Ambulatory Visit | Attending: Internal Medicine | Admitting: Internal Medicine

## 2023-06-10 DIAGNOSIS — Z1231 Encounter for screening mammogram for malignant neoplasm of breast: Secondary | ICD-10-CM | POA: Insufficient documentation

## 2023-06-20 DIAGNOSIS — J302 Other seasonal allergic rhinitis: Secondary | ICD-10-CM | POA: Insufficient documentation

## 2023-06-20 NOTE — Assessment & Plan Note (Signed)
Started on fexofenadine and Pataday eyedrops. Advised patient to stop using Claritin. If symptoms not improving call the office.

## 2023-06-23 DIAGNOSIS — Z0000 Encounter for general adult medical examination without abnormal findings: Secondary | ICD-10-CM | POA: Diagnosis not present

## 2023-06-23 DIAGNOSIS — L821 Other seborrheic keratosis: Secondary | ICD-10-CM | POA: Diagnosis not present

## 2023-06-23 DIAGNOSIS — L02821 Furuncle of head [any part, except face]: Secondary | ICD-10-CM | POA: Diagnosis not present

## 2023-06-25 ENCOUNTER — Encounter: Payer: Self-pay | Admitting: Internal Medicine

## 2023-07-01 DIAGNOSIS — M9903 Segmental and somatic dysfunction of lumbar region: Secondary | ICD-10-CM | POA: Diagnosis not present

## 2023-07-01 DIAGNOSIS — M62830 Muscle spasm of back: Secondary | ICD-10-CM | POA: Diagnosis not present

## 2023-07-01 DIAGNOSIS — M546 Pain in thoracic spine: Secondary | ICD-10-CM | POA: Diagnosis not present

## 2023-07-01 DIAGNOSIS — M542 Cervicalgia: Secondary | ICD-10-CM | POA: Diagnosis not present

## 2023-07-13 ENCOUNTER — Telehealth: Payer: Self-pay | Admitting: Internal Medicine

## 2023-07-13 NOTE — Telephone Encounter (Signed)
Copied from CRM [id number]. Topic: Medicare AWV >> Jul 13, 2023  9:31 AM Payton Doughty wrote: Reason for CRM: Called LVM 07/13/2023 to schedule Annual Wellness Visit  Verlee Rossetti; Care Guide Ambulatory Clinical Support [place] [facility] Direct Dial: [phone]

## 2023-07-19 ENCOUNTER — Ambulatory Visit (INDEPENDENT_AMBULATORY_CARE_PROVIDER_SITE_OTHER): Payer: PPO | Admitting: *Deleted

## 2023-07-19 VITALS — Ht 65.0 in | Wt 164.0 lb

## 2023-07-19 DIAGNOSIS — Z0000 Encounter for general adult medical examination without abnormal findings: Secondary | ICD-10-CM

## 2023-07-19 NOTE — Progress Notes (Signed)
Subjective:   Jacqueline Mata is a 82 y.o. female who presents for Medicare Annual (Subsequent) preventive examination.  Visit Complete: Virtual I connected with  Pershing Cox on 07/19/23 by a audio enabled telemedicine application and verified that I am speaking with the correct person using two identifiers.  Patient Location: Home  Provider Location: Office/Clinic  I discussed the limitations of evaluation and management by telemedicine. The patient expressed understanding and agreed to proceed.  Vital Signs: Because this visit was a virtual/telehealth visit, some criteria may be missing or patient reported. Any vitals not documented were not able to be obtained and vitals that have been documented are patient reported.  Patient Medicare AWV questionnaire was completed by the patient on 07/15/23; I have confirmed that all information answered by patient is correct and no changes since this date.  Cardiac Risk Factors include: advanced age (>53men, >47 women);diabetes mellitus;dyslipidemia     Objective:    Today's Vitals   07/19/23 1507  Weight: 164 lb (74.4 kg)  Height: 5\' 5"  (1.651 m)   Body mass index is 27.29 kg/m.     07/19/2023    3:24 PM 03/12/2023    8:12 AM 03/04/2023    3:21 PM 08/07/2022   10:45 AM 07/14/2022    4:04 PM 01/08/2022   12:15 PM 11/13/2021   11:02 AM  Advanced Directives  Does Patient Have a Medical Advance Directive? No Yes Yes Yes Yes Yes Yes  Type of Advance Directive  Living will Living will Living will;Healthcare Power of Attorney Living will Living will;Healthcare Power of [employer] Power of [place];Living will  Does patient want to make changes to medical advance directive?     No - Patient declined No - Patient declined   Copy of Healthcare Power of Attorney in Chart?   No - copy requested   No - copy requested   Would patient like information on creating a medical advance directive? No - Patient declined          Current  Medications (verified) Outpatient Encounter Medications as of 07/19/2023  Medication Sig   amoxicillin (AMOXIL) 500 MG capsule Take 2,000 mg by mouth See admin instructions. Dental procedures. Takes 2000mg  one hour prior to dental procedures.   ascorbic acid (VITAMIN C) 500 MG tablet Take 500 mg by mouth daily.   blood glucose meter kit and supplies Dispense based on patient and insurance preference. Use to check blood sugars one time daily. (ICD-10 E11.9).   Cholecalciferol (VITAMIN D) 50 MCG (2000 UT) tablet Take 2,000 Units by mouth daily.   COLLAGEN PO Take 1 Scoop by mouth every other day. With Probiotic   ezetimibe (ZETIA) 10 MG tablet TAKE 1 TABLET BY MOUTH ONCE DAILY   fexofenadine (ALLEGRA ALLERGY) 60 MG tablet Take 1 tablet (60 mg total) by mouth 2 (two) times daily.   fluconazole (DIFLUCAN) 200 MG tablet Take 200 mg by mouth once a week.   folic acid (FOLVITE) 800 MCG tablet Take 800 mcg by mouth daily.   glucose blood (ONETOUCH VERIO) test strip Use to check blood sugar once daily.   glucose blood (ONETOUCH VERIO) test strip Use to check blood sugars twice daily.   Ivermectin 1 % CREA Apply 1 application  topically every evening.   Lancets (ONETOUCH DELICA PLUS LANCET33G) MISC USE TO CHECK BLOOD SUGAR ONCE DAILY   Lancets (ONETOUCH DELICA PLUS LANCET33G) MISC Use to check blood sugars twice daily.   levothyroxine (SYNTHROID) 88 MCG tablet TAKE  ONE TABLET EVERY DAY BEFORE BREAKFAST   OVER THE COUNTER MEDICATION Place 2 sprays into the nose daily. Sovereign Silver Nasal Spray (Immune Support)   Propylene Glycol (SYSTANE BALANCE) 0.6 % SOLN Place 1 drop into both eyes daily as needed (dry eyes).   vitamin B-12 (CYANOCOBALAMIN) 1000 MCG tablet Take 1,000 mcg by mouth daily.   No facility-administered encounter medications on file as of 07/19/2023.    Allergies (verified) Codeine, Ilevro [nepafenac], Other, Maxitrol [neomycin-polymyxin-dexameth], Metformin and related, Augmentin  [amoxicillin-pot clavulanate], Molds & smuts, Neomycin, Neomycin-bacitracin zn-polymyx, Neomycin-polymyxin-hc, Tape, Tapentadol, Terbinafine, and Tramadol   History: Past Medical History:  Diagnosis Date   Arthritis    breast cancer 2005   bilateral   Cataract 01/04/13 and 03/01/13   Complication of anesthesia    dizziness    Hyperlipidemia    hypothyroidism    Hypothyroidism    Personal history of chemotherapy    Personal history of radiation therapy    PONV (postoperative nausea and vomiting)    Pre-diabetes    Sleep apnea    mild diagnosis no cpap   Tinnitus    Past Surgical History:  Procedure Laterality Date   bilateral knee replacement s     BREAST BIOPSY Left 2005   positive   BREAST BIOPSY Right 2005   positive   BREAST LUMPECTOMY Left 2005   BREAST LUMPECTOMY Right 2005   gumm surgery      JOINT REPLACEMENT  2013   by Dr. Ernest Pine   righ tknee arthroscopy      TONSILLECTOMY     TOTAL HIP ARTHROPLASTY Right 11/12/2020   Procedure: RIGHT TOTAL HIP ARTHROPLASTY ANTERIOR APPROACH;  Surgeon: Marcene Corning, MD;  Location: [facility];  Service: Orthopedics;  Laterality: Right;   TOTAL HIP ARTHROPLASTY Left 11/25/2021   Procedure: LEFT TOTAL HIP ARTHROPLASTY ANTERIOR APPROACH;  Surgeon: Marcene Corning, MD;  Location: [facility];  Service: Orthopedics;  Laterality: Left;   Family History  Problem Relation Age of Onset   Cancer Mother 63       ovarian   Cancer Sister        lung, former tobacco   Breast cancer Maternal Grandmother    Cancer Brother 53       lung, thyroid, melanoma   Diabetes Son    Social History   Socioeconomic History   Marital status: Divorced    Spouse name: Not on file   Number of children: Not on file   Years of education: Not on file   Highest education level: Not on file  Occupational History   Not on file  Tobacco Use   Smoking status: Former    Current packs/day: 0.00    Average packs/day: 1 pack/day for 19.0 years (19.0 ttl pk-yrs)     Types: Cigarettes    Start date: 01/02/1960    Quit date: 07/22/1978    Years since quitting: 45.0   Smokeless tobacco: Never   Tobacco comments:    quit 36 years ago  Vaping Use   Vaping status: Never Used  Substance and Sexual Activity   Alcohol use: Not Currently    Comment: rare   Drug use: No   Sexual activity: Never  Other Topics Concern   Not on file  Social History Narrative   Not on file   Social Determinants of Health   Financial Resource Strain: Low Risk  (07/15/2023)   Overall Financial Resource Strain (CARDIA)    Difficulty of Paying Living Expenses: Not hard at  all  Food Insecurity: No Food Insecurity (07/15/2023)   Hunger Vital Sign    Worried About Running Out of Food in the Last Year: Never true    Ran Out of Food in the Last Year: Never true  Transportation Needs: No Transportation Needs (07/15/2023)   PRAPARE - Administrator, Civil Service (Medical): No    Lack of Transportation (Non-Medical): No  Physical Activity: Sufficiently Active (07/19/2023)   Exercise Vital Sign    Days of Exercise per Week: 3 days    Minutes of Exercise per Session: 90 min  Stress: No Stress Concern Present (07/15/2023)   [employer] of Occupational Health - Occupational Stress Questionnaire    Feeling of Stress : Not at all  Social Connections: Moderately Integrated (07/15/2023)   Social Connection and Isolation Panel [NHANES]    Frequency of Communication with Friends and Family: More than three times a week    Frequency of Social Gatherings with Friends and Family: More than three times a week    Attends Religious Services: More than 4 times per year    Active Member of [employer] or Organizations: Yes    Attends Engineer, structural: More than 4 times per year    Marital Status: Divorced    Tobacco Counseling Counseling given: Not Answered Tobacco comments: quit 36 years ago   Clinical Intake:  Pre-visit preparation completed: Yes  Pain :  No/denies pain     BMI - recorded: 27.29 Nutritional Status: BMI 25 -29 Overweight Nutritional Risks: None Diabetes: Yes CBG done?: No (126 FBS) Did pt. bring in CBG monitor from home?: No  How often do you need to have someone help you when you read instructions, pamphlets, or other written materials from your doctor or pharmacy?: 1 - Never  Interpreter Needed?: No  Information entered by :: R. Shaida Route LPN   Activities of Daily Living    07/15/2023    5:46 PM  In your present state of health, do you have any difficulty performing the following activities:  Hearing? 1  Comment a little  Vision? 0  Comment glasses  Difficulty concentrating or making decisions? 1  Comment some short term memory  Walking or climbing stairs? 0  Dressing or bathing? 0  Doing errands, shopping? 0  Preparing Food and eating ? N  Using the Toilet? N  In the past six months, have you accidently leaked urine? Y  Comment wears a pad  Do you have problems with loss of bowel control? N  Managing your Medications? N  Managing your Finances? N  Housekeeping or managing your Housekeeping? N    Patient Care Team: Sherlene Shams, MD as PCP - General (Internal Medicine)  Indicate any recent Medical Services you may have received from other than [facility] providers in the past year (date may be approximate).     Assessment:   This is a routine wellness examination for [place].  Hearing/Vision screen Hearing Screening - Comments:: Some issues Vision Screening - Comments:: glasses   Goals Addressed             This Visit's Progress    Patient Stated       Wants to eat properly and continue to exercise       Depression Screen    07/19/2023    3:19 PM 06/04/2023   10:48 AM 05/03/2023    2:04 PM 11/02/2022    2:28 PM 10/21/2022    3:15 PM 07/14/2022  3:59 PM 11/10/2021    3:47 PM  PHQ 2/9 Scores  PHQ - 2 Score 0 2 2 0 0 0 0  PHQ- 9 Score 0 8 5        Fall Risk    07/15/2023    5:46 PM  06/04/2023   10:46 AM 05/03/2023    2:04 PM 11/02/2022    2:28 PM 10/21/2022    3:15 PM  Fall Risk   Falls in the past year? 0 0 0 0 0  Number falls in past yr: 0 0 0 0 0  Injury with Fall? 0 0 0 0 0  Risk for fall due to : No Fall Risks No Fall Risks No Fall Risks No Fall Risks No Fall Risks  Follow up Falls prevention discussed;Falls evaluation completed Falls evaluation completed Falls evaluation completed Falls evaluation completed Falls evaluation completed    MEDICARE RISK AT HOME: Medicare Risk at Home Any stairs in or around the home?: Yes If so, are there any without handrails?: No Home free of loose throw rugs in walkways, pet beds, electrical cords, etc?: Yes Adequate lighting in your home to reduce risk of falls?: Yes Life alert?: No Use of a cane, walker or w/c?: No Grab bars in the bathroom?: No Shower chair or bench in shower?: Yes Elevated toilet seat or a handicapped toilet?: Yes  TCognitive Function:    12/09/2015    1:34 PM  MMSE - Mini Mental State Exam  Orientation to time 5  Orientation to Place 5  Registration 3  Attention/ Calculation 5  Recall 3  Language- name 2 objects 2  Language- repeat 1  Language- follow 3 step command 3  Language- read & follow direction 1  Write a sentence 1  Copy design 1  Total score 30        07/19/2023    3:25 PM 07/14/2022    4:09 PM 07/10/2020   11:26 AM 06/30/2019   11:26 AM  6CIT Screen  What Year? 0 points 0 points 0 points 0 points  What month? 0 points 0 points 0 points 0 points  What time? 0 points 0 points  0 points  Count back from 20 0 points 0 points  0 points  Months in reverse 0 points 0 points 0 points 0 points  Repeat phrase 2 points 0 points  0 points  Total Score 2 points 0 points  0 points    Immunizations Immunization History  Administered Date(s) Administered   Fluad Quad(high Dose 65+) 07/10/2021   Influenza, High Dose Seasonal PF 07/01/2017, 06/30/2018, 06/19/2019   Influenza,inj,Quad  PF,6+ Mos 07/28/2013, 07/18/2014   Influenza-Unspecified 07/04/2015, 07/30/2016, 06/25/2020, 08/12/2022   PFIZER(Purple Top)SARS-COV-2 Vaccination 10/05/2019, 10/26/2019, 07/05/2020, 01/10/2021   Pfizer Covid-19 Vaccine Bivalent Booster 39yrs & up 07/14/2021   Pneumococcal Conjugate-13 05/21/2014   Pneumococcal Polysaccharide-23 09/29/2003, 02/12/2012   Tdap 08/01/2012, 10/28/2022   Zoster Recombinant(Shingrix) 07/26/2018, 12/02/2018, 06/22/2019   Zoster, Live 09/28/2009    TDAP status: Up to date  Flu Vaccine status: Due, Education has been provided regarding the importance of this vaccine. Advised may receive this vaccine at local pharmacy or Health Dept. Aware to provide a copy of the vaccination record if obtained from local pharmacy or Health Dept. Verbalized acceptance and understanding.  Pneumococcal vaccine status: Up to date  Covid-19 vaccine status: Information provided on how to obtain vaccines.   Qualifies for Shingles Vaccine? Yes   Zostavax completed Yes   Shingrix Completed?: Yes  Screening  Tests Health Maintenance  Topic Date Due   Medicare Annual Wellness (AWV)  07/15/2023   INFLUENZA VACCINE  12/27/2023 (Originally 04/29/2023)   Diabetic kidney evaluation - Urine ACR  10/22/2023   FOOT EXAM  10/22/2023   HEMOGLOBIN A1C  11/03/2023   OPHTHALMOLOGY EXAM  02/08/2024   Diabetic kidney evaluation - eGFR measurement  05/05/2024   MAMMOGRAM  06/09/2024   DTaP/Tdap/Td (3 - Td or Tdap) 10/28/2032   Pneumonia Vaccine 55+ Years old  Completed   DEXA SCAN  Completed   Zoster Vaccines- Shingrix  Completed   HPV VACCINES  Aged Out   COVID-19 Vaccine  Discontinued    Health Maintenance  Health Maintenance Due  Topic Date Due   Medicare Annual Wellness (AWV)  07/15/2023    Colorectal cancer screening: No longer required.   Mammogram status: Completed 05/2023. Repeat every year  Bone Density status: Completed 11/2017. Results reflect: Bone density results:  OSTEOPENIA. Repeat every 2 years. Will discuss with PCP at next visit  Lung Cancer Screening: (Low Dose CT Chest recommended if Age 20-80 years, 20 pack-year currently smoking OR have quit w/in 15years.) does not qualify.     Additional Screening:  Hepatitis C Screening: does not qualify; Completed NA age  Vision Screening: Recommended annual ophthalmology exams for early detection of glaucoma and other disorders of the eye. Is the patient up to date with their annual eye exam?  Yes  Who is the provider or what is the name of the office in which the patient attends annual eye exams? [facility] If pt is not established with a provider, would they like to be referred to a provider to establish care? No .   Dental Screening: Recommended annual dental exams for proper oral hygiene  Diabetic Foot Exam: Diabetic Foot Exam: Completed 09/2022  Community Resource Referral / Chronic Care Management: CRR required this visit?  No   CCM required this visit?  No     Plan:     I have personally reviewed and noted the following in the patient's chart:   Medical and social history Use of alcohol, tobacco or illicit drugs  Current medications and supplements including opioid prescriptions. Patient is not currently taking opioid prescriptions. Functional ability and status Nutritional status Physical activity Advanced directives List of other physicians Hospitalizations, surgeries, and ER visits in previous 12 months Vitals Screenings to include cognitive, depression, and falls Referrals and appointments  In addition, I have reviewed and discussed with patient certain preventive protocols, quality metrics, and best practice recommendations. A written personalized care plan for preventive services as well as general preventive health recommendations were provided to patient.     Sydell Axon, LPN   [id number]   After Visit Summary: (MyChart) Due to this being a telephonic visit, the  after visit summary with patients personalized plan was offered to patient via MyChart   Nurse Notes: None

## 2023-07-19 NOTE — Patient Instructions (Signed)
Jacqueline Mata , Thank you for taking time to come for your Medicare Wellness Visit. I appreciate your ongoing commitment to your health goals. Please review the following plan we discussed and let me know if I can assist you in the future.   Referrals/Orders/Follow-Ups/Clinician Recommendations: Remember to get your flu shot  This is a list of the screening recommended for you and due dates:  Health Maintenance  Topic Date Due   Flu Shot  12/27/2023*   Yearly kidney health urinalysis for diabetes  10/22/2023   Complete foot exam   10/22/2023   Hemoglobin A1C  11/03/2023   Eye exam for diabetics  02/08/2024   Yearly kidney function blood test for diabetes  05/05/2024   Mammogram  06/09/2024   Medicare Annual Wellness Visit  07/18/2024   DTaP/Tdap/Td vaccine (3 - Td or Tdap) 10/28/2032   Pneumonia Vaccine  Completed   DEXA scan (bone density measurement)  Completed   Zoster (Shingles) Vaccine  Completed   HPV Vaccine  Aged Out   COVID-19 Vaccine  Discontinued  *Topic was postponed. The date shown is not the original due date.    Advanced directives: (Declined) Advance directive discussed with you today. Even though you declined this today, please call our office should you change your mind, and we can give you the proper paperwork for you to fill out.  Next Medicare Annual Wellness Visit scheduled for next year: Yes 07/19/24 @ 3:00

## 2023-07-23 ENCOUNTER — Other Ambulatory Visit: Payer: Self-pay | Admitting: Internal Medicine

## 2023-07-26 ENCOUNTER — Encounter: Payer: Self-pay | Admitting: Internal Medicine

## 2023-07-29 DIAGNOSIS — M9903 Segmental and somatic dysfunction of lumbar region: Secondary | ICD-10-CM | POA: Diagnosis not present

## 2023-07-29 DIAGNOSIS — M62830 Muscle spasm of back: Secondary | ICD-10-CM | POA: Diagnosis not present

## 2023-07-29 DIAGNOSIS — M542 Cervicalgia: Secondary | ICD-10-CM | POA: Diagnosis not present

## 2023-07-29 DIAGNOSIS — M546 Pain in thoracic spine: Secondary | ICD-10-CM | POA: Diagnosis not present

## 2023-08-09 ENCOUNTER — Encounter: Payer: Self-pay | Admitting: Internal Medicine

## 2023-08-11 ENCOUNTER — Other Ambulatory Visit: Payer: Self-pay | Admitting: Internal Medicine

## 2023-08-11 MED ORDER — FEXOFENADINE HCL 60 MG PO TABS: 60.0000 mg | ORAL_TABLET | Freq: Two times a day (BID) | ORAL | 1 refills | Status: AC

## 2023-08-11 MED ORDER — FEXOFENADINE HCL 60 MG PO TABS: 60.0000 mg | ORAL_TABLET | Freq: Two times a day (BID) | ORAL | 1 refills | Status: DC

## 2023-08-11 NOTE — Telephone Encounter (Signed)
Flu shot has been documented. 

## 2023-08-18 DIAGNOSIS — D2272 Melanocytic nevi of left lower limb, including hip: Secondary | ICD-10-CM | POA: Diagnosis not present

## 2023-08-18 DIAGNOSIS — I788 Other diseases of capillaries: Secondary | ICD-10-CM | POA: Diagnosis not present

## 2023-08-18 DIAGNOSIS — D2261 Melanocytic nevi of right upper limb, including shoulder: Secondary | ICD-10-CM | POA: Diagnosis not present

## 2023-08-18 DIAGNOSIS — D225 Melanocytic nevi of trunk: Secondary | ICD-10-CM | POA: Diagnosis not present

## 2023-08-18 DIAGNOSIS — L821 Other seborrheic keratosis: Secondary | ICD-10-CM | POA: Diagnosis not present

## 2023-08-18 DIAGNOSIS — Z85828 Personal history of other malignant neoplasm of skin: Secondary | ICD-10-CM | POA: Diagnosis not present

## 2023-08-18 DIAGNOSIS — D2271 Melanocytic nevi of right lower limb, including hip: Secondary | ICD-10-CM | POA: Diagnosis not present

## 2023-08-18 DIAGNOSIS — L718 Other rosacea: Secondary | ICD-10-CM | POA: Diagnosis not present

## 2023-08-18 DIAGNOSIS — D2262 Melanocytic nevi of left upper limb, including shoulder: Secondary | ICD-10-CM | POA: Diagnosis not present

## 2023-08-19 DIAGNOSIS — M62830 Muscle spasm of back: Secondary | ICD-10-CM | POA: Diagnosis not present

## 2023-08-19 DIAGNOSIS — M546 Pain in thoracic spine: Secondary | ICD-10-CM | POA: Diagnosis not present

## 2023-08-19 DIAGNOSIS — M9903 Segmental and somatic dysfunction of lumbar region: Secondary | ICD-10-CM | POA: Diagnosis not present

## 2023-08-19 DIAGNOSIS — M542 Cervicalgia: Secondary | ICD-10-CM | POA: Diagnosis not present

## 2023-09-02 ENCOUNTER — Other Ambulatory Visit: Payer: Self-pay

## 2023-09-02 ENCOUNTER — Inpatient Hospital Stay: Payer: PPO | Attending: Hematology & Oncology

## 2023-09-02 ENCOUNTER — Inpatient Hospital Stay: Payer: PPO | Admitting: Hematology & Oncology

## 2023-09-02 ENCOUNTER — Encounter: Payer: Self-pay | Admitting: Hematology & Oncology

## 2023-09-02 VITALS — BP 113/75 | HR 89 | Temp 98.8°F | Resp 18 | Wt 166.0 lb

## 2023-09-02 DIAGNOSIS — Z853 Personal history of malignant neoplasm of breast: Secondary | ICD-10-CM | POA: Diagnosis not present

## 2023-09-02 DIAGNOSIS — C50919 Malignant neoplasm of unspecified site of unspecified female breast: Secondary | ICD-10-CM

## 2023-09-02 DIAGNOSIS — Z08 Encounter for follow-up examination after completed treatment for malignant neoplasm: Secondary | ICD-10-CM | POA: Diagnosis not present

## 2023-09-02 DIAGNOSIS — D0501 Lobular carcinoma in situ of right breast: Secondary | ICD-10-CM

## 2023-09-02 LAB — CMP (CANCER CENTER ONLY)
ALT: 32 U/L (ref 0–44)
AST: 22 U/L (ref 15–41)
Albumin: 4.4 g/dL (ref 3.5–5.0)
Alkaline Phosphatase: 55 U/L (ref 38–126)
Anion gap: 9 (ref 5–15)
BUN: 20 mg/dL (ref 8–23)
CO2: 25 mmol/L (ref 22–32)
Calcium: 10.2 mg/dL (ref 8.9–10.3)
Chloride: 105 mmol/L (ref 98–111)
Creatinine: 1.05 mg/dL — ABNORMAL HIGH (ref 0.44–1.00)
GFR, Estimated: 53 mL/min — ABNORMAL LOW (ref >60)
Glucose, Bld: 145 mg/dL — ABNORMAL HIGH (ref 70–99)
Potassium: 4 mmol/L (ref 3.5–5.1)
Sodium: 139 mmol/L (ref 135–145)
Total Bilirubin: 1.1 mg/dL (ref <1.2)
Total Protein: 7.4 g/dL (ref 6.5–8.1)

## 2023-09-02 LAB — CBC WITH DIFFERENTIAL (CANCER CENTER ONLY)
Abs Immature Granulocytes: 0.05 10*3/uL (ref 0.00–0.07)
Basophils Absolute: 0.1 10*3/uL (ref 0.0–0.1)
Basophils Relative: 1 %
Eosinophils Absolute: 0.3 10*3/uL (ref 0.0–0.5)
Eosinophils Relative: 3 %
HCT: 42.1 % (ref 36.0–46.0)
Hemoglobin: 14.4 g/dL (ref 12.0–15.0)
Immature Granulocytes: 1 %
Lymphocytes Relative: 21 %
Lymphs Abs: 1.9 10*3/uL (ref 0.7–4.0)
MCH: 30.5 pg (ref 26.0–34.0)
MCHC: 34.2 g/dL (ref 30.0–36.0)
MCV: 89.2 fL (ref 80.0–100.0)
Monocytes Absolute: 0.5 10*3/uL (ref 0.1–1.0)
Monocytes Relative: 5 %
Neutro Abs: 6.1 10*3/uL (ref 1.7–7.7)
Neutrophils Relative %: 69 %
Platelet Count: 172 10*3/uL (ref 150–400)
RBC: 4.72 MIL/uL (ref 3.87–5.11)
RDW: 13 % (ref 11.5–15.5)
WBC Count: 8.9 10*3/uL (ref 4.0–10.5)
nRBC: 0 % (ref 0.0–0.2)

## 2023-09-02 NOTE — Progress Notes (Signed)
Hematology and Oncology Follow Up Visit  Jacqueline Mata [id number] Jul 01, 1941 82 y.o. 09/02/2023   Principle Diagnosis:  Synchronous bilateral stage IIA (T2 N0 M0) ductal carcinoma of bilateral breast - ER+/HER2-.  Current Therapy:   Observation     Interim History:  Ms.  Mata is comes in for followup.  We see her every 6 months.  Since we last saw her, she has been doing quite well.  She really has had no complaints.  She is exercising.  She is staying quite active.  She really has had no issues with respect to her skin.  When we last saw her, she was using 5-fluorouracil cream.  She has had good appetite.  She had a wonderful Thanksgiving.  She will have a nice Christmas..  She has had no change in bowel or bladder habits.  She has had no leg swelling.  There may be a little bit of swelling around the left ankle.  She has had no fever.  Thankfully, there is been no problems with COVID.  Overall, I would say that her performance status is probably ECOG 1.   Medications:  Current Outpatient Medications:    amoxicillin (AMOXIL) 500 MG capsule, Take 2,000 mg by mouth See admin instructions. Dental procedures. Takes 2000mg  one hour prior to dental procedures., Disp: , Rfl:    ascorbic acid (VITAMIN C) 500 MG tablet, Take 500 mg by mouth daily., Disp: , Rfl:    blood glucose meter kit and supplies, Dispense based on patient and insurance preference. Use to check blood sugars one time daily. (ICD-10 E11.9)., Disp: 1 each, Rfl: 0   Cholecalciferol (VITAMIN D) 50 MCG (2000 UT) tablet, Take 2,000 Units by mouth daily., Disp: , Rfl:    COLLAGEN PO, Take 1 Scoop by mouth every other day. With Probiotic, Disp: , Rfl:    ezetimibe (ZETIA) 10 MG tablet, TAKE 1 TABLET BY MOUTH ONCE DAILY, Disp: 90 tablet, Rfl: 1   fexofenadine (ALLEGRA ALLERGY) 60 MG tablet, Take 1 tablet (60 mg total) by mouth 2 (two) times daily., Disp: 180 tablet, Rfl: 1   fluconazole (DIFLUCAN) 200 MG tablet, Take 200 mg  by mouth once a week., Disp: , Rfl:    folic acid (FOLVITE) 800 MCG tablet, Take 800 mcg by mouth daily., Disp: , Rfl:    glucose blood (ONETOUCH VERIO) test strip, Use to check blood sugar once daily., Disp: 100 strip, Rfl: 3   glucose blood (ONETOUCH VERIO) test strip, Use to check blood sugars twice daily., Disp: 100 each, Rfl: 12   Ivermectin 1 % CREA, Apply 1 application  topically every evening., Disp: , Rfl:    Lancets (ONETOUCH DELICA PLUS LANCET33G) MISC, USE TO CHECK BLOOD SUGAR ONCE DAILY, Disp: 100 each, Rfl: 0   Lancets (ONETOUCH DELICA PLUS LANCET33G) MISC, Use to check blood sugars twice daily., Disp: 100 each, Rfl: 3   levothyroxine (SYNTHROID) 88 MCG tablet, TAKE ONE TABLET EVERY DAY BEFORE BREAKFAST, Disp: 90 tablet, Rfl: 1   OVER THE COUNTER MEDICATION, Place 2 sprays into the nose daily. Sovereign Silver Nasal Spray (Immune Support), Disp: , Rfl:    Propylene Glycol (SYSTANE BALANCE) 0.6 % SOLN, Place 1 drop into both eyes daily as needed (dry eyes)., Disp: , Rfl:    vitamin B-12 (CYANOCOBALAMIN) 1000 MCG tablet, Take 1,000 mcg by mouth daily., Disp: , Rfl:   Allergies:  Allergies  Allergen Reactions   Codeine Shortness Of Breath and Other (See Comments)    Altered  mental staus   Ilevro [Nepafenac] Other (See Comments)    Eye swelling Eye drops    Other Swelling    DOGS.  Congestion. FEATHERS.  Congestion  Can not take preservative free eye drops     Maxitrol [Neomycin-Polymyxin-Dexameth]     dry eyes    Metformin And Related     Shakiness    Augmentin [Amoxicillin-Pot Clavulanate] Itching and Rash   Molds & Smuts Other (See Comments)    Congestion.   Neomycin Rash   Neomycin-Bacitracin Zn-Polymyx Rash   Neomycin-Polymyxin-Hc Rash   Tape Dermatitis    Blisters   Tapentadol Nausea And Vomiting    Nucynta   Terbinafine Rash    Trouble swallowing   Tramadol Other (See Comments), Nausea Only and Nausea And Vomiting    dizziness    Past Medical History,  Surgical history, Social history, and Family History were reviewed and updated.  Review of Systems: Review of Systems  Constitutional: Negative.   HENT: Negative.    Eyes: Negative.   Respiratory: Negative.    Cardiovascular: Negative.   Gastrointestinal: Negative.   Genitourinary: Negative.   Musculoskeletal: Negative.   Skin: Negative.   Neurological: Negative.   Endo/Heme/Allergies: Negative.   Psychiatric/Behavioral: Negative.       Physical Exam:  weight is 166 lb (75.3 kg). Her oral temperature is 98.8 F (37.1 C). Her blood pressure is 113/75 and her pulse is 89. Her respiration is 18 and oxygen saturation is 97%.   Physical Exam Vitals reviewed.  HENT:     Head: Normocephalic and atraumatic.  Eyes:     Pupils: Pupils are equal, round, and reactive to light.  Cardiovascular:     Rate and Rhythm: Normal rate and regular rhythm.     Heart sounds: Normal heart sounds.  Pulmonary:     Effort: Pulmonary effort is normal.     Breath sounds: Normal breath sounds.  Abdominal:     General: Bowel sounds are normal.     Palpations: Abdomen is soft.  Musculoskeletal:        General: No tenderness or deformity. Normal range of motion.     Cervical back: Normal range of motion.  Lymphadenopathy:     Cervical: No cervical adenopathy.  Skin:    General: Skin is warm and dry.     Findings: No erythema or rash.  Neurological:     Mental Status: She is alert and oriented to person, place, and time.  Psychiatric:        Behavior: Behavior normal.        Thought Content: Thought content normal.        Judgment: Judgment normal.   Lab Results  Component Value Date   WBC 8.9 09/02/2023   HGB 14.4 09/02/2023   HCT 42.1 09/02/2023   MCV 89.2 09/02/2023   PLT 172 09/02/2023     Chemistry      Component Value Date/Time   NA 139 09/02/2023 0941   NA 135 04/29/2017 1157   NA 138 10/29/2016 1021   K 4.0 09/02/2023 0941   K 3.7 04/29/2017 1157   K 4.2 10/29/2016 1021    CL 105 09/02/2023 0941   CL 102 04/29/2017 1157   CO2 25 09/02/2023 0941   CO2 27 04/29/2017 1157   CO2 24 10/29/2016 1021   BUN 20 09/02/2023 0941   BUN 9 04/29/2017 1157   BUN 17.3 10/29/2016 1021   CREATININE 1.05 (H) 09/02/2023 0941   CREATININE 0.9 04/29/2017  1157   CREATININE 0.9 10/29/2016 1021      Component Value Date/Time   CALCIUM 10.2 09/02/2023 0941   CALCIUM 9.2 04/29/2017 1157   CALCIUM 10.0 10/29/2016 1021   ALKPHOS 55 09/02/2023 0941   ALKPHOS 64 04/29/2017 1157   ALKPHOS 71 10/29/2016 1021   AST 22 09/02/2023 0941   AST 24 10/29/2016 1021   ALT 32 09/02/2023 0941   ALT 37 04/29/2017 1157   ALT 26 10/29/2016 1021   BILITOT 1.1 09/02/2023 0941   BILITOT 1.18 10/29/2016 1021       Impression and Plan: Jacqueline Mata is 82 year old white female with history of synchronous bilateral breast cancer. Fortunately both breast cancers were node negative. They were ER positive and HER-2 negative.   It has now been  18 years since she was treated for the breast cancer.  I really have to believe that she is going to be cured since her cancers were node negative.  I think that the best thing that she can do for herself is to keep going to the gym.  This will help her bones.  We will plan to get her back in 6 months.     Josph Macho, MD 12/5/202410:50 AM

## 2023-09-16 DIAGNOSIS — M546 Pain in thoracic spine: Secondary | ICD-10-CM | POA: Diagnosis not present

## 2023-09-16 DIAGNOSIS — M9903 Segmental and somatic dysfunction of lumbar region: Secondary | ICD-10-CM | POA: Diagnosis not present

## 2023-09-16 DIAGNOSIS — M542 Cervicalgia: Secondary | ICD-10-CM | POA: Diagnosis not present

## 2023-09-16 DIAGNOSIS — M62830 Muscle spasm of back: Secondary | ICD-10-CM | POA: Diagnosis not present

## 2023-10-14 DIAGNOSIS — M542 Cervicalgia: Secondary | ICD-10-CM | POA: Diagnosis not present

## 2023-10-14 DIAGNOSIS — M9903 Segmental and somatic dysfunction of lumbar region: Secondary | ICD-10-CM | POA: Diagnosis not present

## 2023-10-14 DIAGNOSIS — M62830 Muscle spasm of back: Secondary | ICD-10-CM | POA: Diagnosis not present

## 2023-10-14 DIAGNOSIS — M546 Pain in thoracic spine: Secondary | ICD-10-CM | POA: Diagnosis not present

## 2023-11-04 ENCOUNTER — Encounter: Payer: Self-pay | Admitting: Internal Medicine

## 2023-11-04 ENCOUNTER — Ambulatory Visit (INDEPENDENT_AMBULATORY_CARE_PROVIDER_SITE_OTHER): Payer: PPO | Admitting: Internal Medicine

## 2023-11-04 VITALS — BP 120/80 | HR 91 | Ht 65.0 in | Wt 164.8 lb

## 2023-11-04 DIAGNOSIS — C50919 Malignant neoplasm of unspecified site of unspecified female breast: Secondary | ICD-10-CM | POA: Diagnosis not present

## 2023-11-04 DIAGNOSIS — Z170 Estrogen receptor positive status [ER+]: Secondary | ICD-10-CM

## 2023-11-04 DIAGNOSIS — E039 Hypothyroidism, unspecified: Secondary | ICD-10-CM

## 2023-11-04 DIAGNOSIS — B351 Tinea unguium: Secondary | ICD-10-CM | POA: Diagnosis not present

## 2023-11-04 DIAGNOSIS — G4762 Sleep related leg cramps: Secondary | ICD-10-CM

## 2023-11-04 DIAGNOSIS — E785 Hyperlipidemia, unspecified: Secondary | ICD-10-CM | POA: Diagnosis not present

## 2023-11-04 DIAGNOSIS — Z853 Personal history of malignant neoplasm of breast: Secondary | ICD-10-CM | POA: Diagnosis not present

## 2023-11-04 DIAGNOSIS — E1169 Type 2 diabetes mellitus with other specified complication: Secondary | ICD-10-CM | POA: Diagnosis not present

## 2023-11-04 DIAGNOSIS — E119 Type 2 diabetes mellitus without complications: Secondary | ICD-10-CM

## 2023-11-04 LAB — COMPREHENSIVE METABOLIC PANEL
ALT: 24 U/L (ref 0–35)
AST: 22 U/L (ref 0–37)
Albumin: 4.4 g/dL (ref 3.5–5.2)
Alkaline Phosphatase: 53 U/L (ref 39–117)
BUN: 18 mg/dL (ref 6–23)
CO2: 23 meq/L (ref 19–32)
Calcium: 9.8 mg/dL (ref 8.4–10.5)
Chloride: 105 meq/L (ref 96–112)
Creatinine, Ser: 1.05 mg/dL (ref 0.40–1.20)
GFR: 49.72 mL/min — ABNORMAL LOW (ref >60.00)
Glucose, Bld: 140 mg/dL — ABNORMAL HIGH (ref 70–99)
Potassium: 4 meq/L (ref 3.5–5.1)
Sodium: 139 meq/L (ref 135–145)
Total Bilirubin: 1 mg/dL (ref 0.2–1.2)
Total Protein: 7.2 g/dL (ref 6.0–8.3)

## 2023-11-04 LAB — LIPID PANEL
Cholesterol: 194 mg/dL (ref 0–200)
HDL: 83.4 mg/dL (ref >39.00)
LDL Cholesterol: 95 mg/dL (ref 0–99)
NonHDL: 110.97
Total CHOL/HDL Ratio: 2
Triglycerides: 79 mg/dL (ref 0.0–149.0)
VLDL: 15.8 mg/dL (ref 0.0–40.0)

## 2023-11-04 LAB — MICROALBUMIN / CREATININE URINE RATIO
Creatinine,U: 95.4 mg/dL
Microalb Creat Ratio: 0.7 mg/g (ref 0.0–30.0)
Microalb, Ur: <0.7 mg/dL (ref 0.0–1.9)

## 2023-11-04 LAB — HEMOGLOBIN A1C: Hgb A1c MFr Bld: 7.2 % — ABNORMAL HIGH (ref 4.6–6.5)

## 2023-11-04 LAB — TSH: TSH: 0.54 u[IU]/mL (ref 0.35–5.50)

## 2023-11-04 LAB — LDL CHOLESTEROL, DIRECT: Direct LDL: 104 mg/dL

## 2023-11-04 LAB — MAGNESIUM: Magnesium: 2 mg/dL (ref 1.5–2.5)

## 2023-11-04 NOTE — Patient Instructions (Addendum)
 The changes to your nails appear to be due to chemicals (soap, polish, polish remover)  and aging.!   I recommend adding a Hair Skin and Nails supplement  to your daily regimen,  and moisturize  your nails just as you mositurize your skin to improve the condition of your nails,   take a break from nail polish  until they start to look better  You will need to suspend  any biotin containing supplement prior to any thyroid  function tests for 72 hours   Stretch your calf musclesafter your workout and  before bed , make sure they are warm    Benadryl cream and continued daily moisturizer to place on left shin   Continue weekly fluconazole for 3 more months

## 2023-11-04 NOTE — Progress Notes (Signed)
 Subjective:  Patient ID: Jacqueline Mata, female    DOB: May 04, 1941  Age: 83 y.o. MRN: [id number]  CC: The primary encounter diagnosis was Hyperlipidemia associated with type 2 diabetes mellitus (HCC). Diagnoses of Acquired hypothyroidism, Diabetes mellitus type 2, diet-controlled (HCC), Nocturnal leg cramps, History of breast cancer, Onychomycosis of great toe, and Stage 2 carcinoma of breast, ER+, unspecified laterality (HCC) were also pertinent to this visit.   HPI DAMESHA LAWLER presents for  Chief Complaint  Patient presents with   Medical Management of Chronic Issues    1) onychomycosis:  she has been   taking fluconazole weekly for  the last year.  Toenail  growing out clean but the dystrophic changes are spreading to other nails.   2) Type DM:   she feels generally well, is  walking  several times per week and checking blood sugars once daily at variable times.  BS have been under 140  fasting and < 150 post prandially.  Denies any recent hypoglyemic events.  Taking zetia  due to statin myalgia . Following a carbohydrate modified diet 6 days per week. Denies numbness, burning and tingling of extremities. Appetite is good.  Lab Results  Component Value Date   HGBA1C 7.2 (H) 11/04/2023   Lab Results  Component Value Date   MICROALBUR <0.7 11/04/2023   MICROALBUR <0.7 10/21/2022   3) Leg cramps:  calf, nocturnal.  Has seen podiatry,,  wearing Brooks sneakers, taking mg glycinate.  Does not stretch     4) left thumb OA  had a steroid injection yrs ago   wants to see [facility]  right middle finger developing a trigger findger   5) white streaks on nails   using   Ginnie Lauth  oil.  Adviing her to add a  Hair skin and nails supplement    6) itchy place on left upper shin   treated fby dermatology in the past or    Outpatient Medications Prior to Visit  Medication Sig Dispense Refill   amoxicillin  (AMOXIL ) 500 MG capsule Take 2,000 mg by mouth See admin  instructions. Dental procedures. Takes 2000mg  one hour prior to dental procedures.     ascorbic acid (VITAMIN C) 500 MG tablet Take 500 mg by mouth daily.     blood glucose meter kit and supplies Dispense based on patient and insurance preference. Use to check blood sugars one time daily. (ICD-10 E11.9). 1 each 0   Cholecalciferol (VITAMIN D ) 50 MCG (2000 UT) tablet Take 2,000 Units by mouth daily.     COLLAGEN PO Take 1 Scoop by mouth every other day. With Probiotic     ezetimibe  (ZETIA ) 10 MG tablet TAKE 1 TABLET BY MOUTH ONCE DAILY 90 tablet 1   fexofenadine  (ALLEGRA  ALLERGY) 60 MG tablet Take 1 tablet (60 mg total) by mouth 2 (two) times daily. 180 tablet 1   fluconazole (DIFLUCAN) 200 MG tablet Take 200 mg by mouth once a week.     folic acid  (FOLVITE ) 800 MCG tablet Take 800 mcg by mouth daily.     glucose blood (ONETOUCH VERIO) test strip Use to check blood sugar once daily. 100 strip 3   Lancets (ONETOUCH DELICA PLUS LANCET33G) MISC USE TO CHECK BLOOD SUGAR ONCE DAILY 100 each 0   Lancets (ONETOUCH DELICA PLUS LANCET33G) MISC Use to check blood sugars twice daily. 100 each 3   levothyroxine  (SYNTHROID ) 88 MCG tablet TAKE ONE TABLET EVERY DAY BEFORE BREAKFAST 90 tablet 1  MAGNESIUM GLUCONATE PO Take 1 capsule by mouth daily.     metroNIDAZOLE (METROCREAM) 0.75 % cream Apply 1 Application topically 2 (two) times daily.     OVER THE COUNTER MEDICATION Place 2 sprays into the nose daily. Sovereign Silver Nasal Spray (Immune Support)     Propylene Glycol (SYSTANE BALANCE) 0.6 % SOLN Place 1 drop into both eyes daily as needed (dry eyes).     vitamin B-12 (CYANOCOBALAMIN ) 1000 MCG tablet Take 1,000 mcg by mouth daily.     glucose blood (ONETOUCH VERIO) test strip Use to check blood sugars twice daily. 100 each 12   Ivermectin 1 % CREA Apply 1 application  topically every evening. (Patient not taking: Reported on 11/04/2023)     No facility-administered medications prior to visit.    Review  of Systems;  Patient denies headache, fevers, malaise, unintentional weight loss, skin rash, eye pain, sinus congestion and sinus pain, sore throat, dysphagia,  hemoptysis , cough, dyspnea, wheezing, chest pain, palpitations, orthopnea, edema, abdominal pain, nausea, melena, diarrhea, constipation, flank pain, dysuria, hematuria, urinary  Frequency, nocturia, numbness, tingling, seizures,  Focal weakness, Loss of consciousness,  Tremor, insomnia, depression, anxiety, and suicidal ideation.      Objective:  BP 120/80   Pulse 91   Ht 5' 5 (1.651 m)   Wt 164 lb 12.8 oz (74.8 kg)   SpO2 96%   BMI 27.42 kg/m   BP Readings from Last 3 Encounters:  11/04/23 120/80  09/02/23 113/75  06/04/23 118/68    Wt Readings from Last 3 Encounters:  11/04/23 164 lb 12.8 oz (74.8 kg)  09/02/23 166 lb (75.3 kg)  07/19/23 164 lb (74.4 kg)    Physical Exam Vitals reviewed.  Constitutional:      General: She is not in acute distress.    Appearance: Normal appearance. She is normal weight. She is not ill-appearing, toxic-appearing or diaphoretic.  HENT:     Head: Normocephalic.  Eyes:     General: No scleral icterus.       Right eye: No discharge.        Left eye: No discharge.     Conjunctiva/sclera: Conjunctivae normal.  Cardiovascular:     Rate and Rhythm: Normal rate and regular rhythm.     Heart sounds: Normal heart sounds.  Pulmonary:     Effort: Pulmonary effort is normal. No respiratory distress.     Breath sounds: Normal breath sounds.  Musculoskeletal:        General: Normal range of motion.  Skin:    General: Skin is warm and dry.  Neurological:     General: No focal deficit present.     Mental Status: She is alert and oriented to person, place, and time. Mental status is at baseline.  Psychiatric:        Mood and Affect: Mood normal.        Behavior: Behavior normal.        Thought Content: Thought content normal.        Judgment: Judgment normal.    Lab Results   Component Value Date   HGBA1C 7.2 (H) 11/04/2023   HGBA1C 6.5 (A) 05/03/2023   HGBA1C 7.4 (H) 10/21/2022    Lab Results  Component Value Date   CREATININE 1.05 11/04/2023   CREATININE 1.05 (H) 09/02/2023   CREATININE 1.00 05/06/2023    Lab Results  Component Value Date   WBC 8.9 09/02/2023   HGB 14.4 09/02/2023   HCT 42.1 09/02/2023  PLT 172 09/02/2023   GLUCOSE 140 (H) 11/04/2023   CHOL 194 11/04/2023   TRIG 79.0 11/04/2023   HDL 83.40 11/04/2023   LDLDIRECT 104.0 11/04/2023   LDLCALC 95 11/04/2023   ALT 24 11/04/2023   AST 22 11/04/2023   NA 139 11/04/2023   K 4.0 11/04/2023   CL 105 11/04/2023   CREATININE 1.05 11/04/2023   BUN 18 11/04/2023   CO2 23 11/04/2023   TSH 0.54 11/04/2023   INR 1.1 11/05/2020   HGBA1C 7.2 (H) 11/04/2023   MICROALBUR <0.7 11/04/2023    MM 3D SCREENING MAMMOGRAM BILATERAL BREAST Result Date: 06/11/2023 CLINICAL DATA:  Screening. EXAM: DIGITAL SCREENING BILATERAL MAMMOGRAM WITH TOMOSYNTHESIS AND CAD TECHNIQUE: Bilateral screening digital craniocaudal and mediolateral oblique mammograms were obtained. Bilateral screening digital breast tomosynthesis was performed. The images were evaluated with computer-aided detection. COMPARISON:  Previous exam(s). ACR Breast Density Category b: There are scattered areas of fibroglandular density. FINDINGS: There are no findings suspicious for malignancy. IMPRESSION: No mammographic evidence of malignancy. A result letter of this screening mammogram will be mailed directly to the patient. RECOMMENDATION: Screening mammogram in one year. (Code:[id number]-B-01Y) BI-RADS CATEGORY  1: Negative. Electronically Signed   By: Dobrinka  Dimitrova M.D.   On: 06/11/2023 15:16    Assessment & Plan:  .Hyperlipidemia associated with type 2 diabetes mellitus (HCC) Assessment & Plan: Excellent glycemic control on diet alone.    Tolerating  zetia  .  No protienuria.  GFR stable   Lab Results  Component Value Date   HGBA1C 7.2  (H) 11/04/2023   Lab Results  Component Value Date   CHOL 194 11/04/2023   HDL 83.40 11/04/2023   LDLCALC 95 11/04/2023   LDLDIRECT 104.0 11/04/2023   TRIG 79.0 11/04/2023   CHOLHDL 2 11/04/2023   Lab Results  Component Value Date   ALT 24 11/04/2023   AST 22 11/04/2023   ALKPHOS 53 11/04/2023   BILITOT 1.0 11/04/2023   Lab Results  Component Value Date   MICROALBUR <0.7 11/04/2023   MICROALBUR <0.7 10/21/2022    Lab Results  Component Value Date   CREATININE 1.05 11/04/2023      Orders: -     Lipid panel -     LDL cholesterol, direct  Acquired hypothyroidism Assessment & Plan: Thyroid  function is normal and she has no symptoms of overactive thyroid    Lab Results  Component Value Date   TSH 0.54 11/04/2023     Orders: -     TSH  Diabetes mellitus type 2, diet-controlled (HCC) -     Hemoglobin A1c -     Microalbumin / creatinine urine ratio -     Comprehensive metabolic panel  Nocturnal leg cramps Assessment & Plan: Electrolytes normal .  Stretching exercises demonstrated   Lab Results  Component Value Date   NA 139 11/04/2023   K 4.0 11/04/2023   CL 105 11/04/2023   CO2 23 11/04/2023   No results found for: CKTOTAL, CKMB, CKMBINDEX, TROPONINI   Orders: -     Magnesium  History of breast cancer Assessment & Plan: Surveillance is ongoing managed by Dr . Timmy  , last mammogram was normal  Sept 2024    Onychomycosis of great toe Assessment & Plan: Treatment options are limited due to prior intolerance to lamisil  and noncoverage of topicals based on previous research done  There is no drug interaction between zetia  and once weekly fluconazole and LFTS are normal.  Continue fluconazole  for 3 months/  Stage 2 carcinoma of breast, ER+, unspecified laterality (HCC) Assessment & Plan: With no recurrence by serial screening managed by Dr Timmy,  Next mammogram due in Sept 2025.       I provided 30 minutes of face-to-face  time during this encounter reviewing patient's last visit with me, patient's  most recent visit with dermatology, oncology,   recent surgical and non surgical procedures, previous  labs and imaging studies, counseling on currently addressed issues,  and post visit ordering to diagnostics and therapeutics .   Follow-up: Return in about 3 months (around 02/03/2024) for follow up diabetes.   Verneita LITTIE Kettering, MD

## 2023-11-05 ENCOUNTER — Other Ambulatory Visit: Payer: Self-pay | Admitting: Internal Medicine

## 2023-11-06 ENCOUNTER — Encounter: Payer: Self-pay | Admitting: Internal Medicine

## 2023-11-06 DIAGNOSIS — G4762 Sleep related leg cramps: Secondary | ICD-10-CM | POA: Insufficient documentation

## 2023-11-06 NOTE — Assessment & Plan Note (Addendum)
 Treatment options are limited due to prior intolerance to lamisil  and noncoverage of topicals based on previous research done  There is no drug interaction between zetia  and once weekly fluconazole and LFTS are normal.  Continue fluconazole  for 3 months/

## 2023-11-06 NOTE — Assessment & Plan Note (Signed)
 With no recurrence by serial screening managed by Dr Maria Shiner,  Next mammogram due in Sept 2025.

## 2023-11-06 NOTE — Assessment & Plan Note (Signed)
 Excellent glycemic control on diet alone.    Tolerating  zetia  .  No protienuria.  GFR stable   Lab Results  Component Value Date   HGBA1C 7.2 (H) 11/04/2023   Lab Results  Component Value Date   CHOL 194 11/04/2023   HDL 83.40 11/04/2023   LDLCALC 95 11/04/2023   LDLDIRECT 104.0 11/04/2023   TRIG 79.0 11/04/2023   CHOLHDL 2 11/04/2023   Lab Results  Component Value Date   ALT 24 11/04/2023   AST 22 11/04/2023   ALKPHOS 53 11/04/2023   BILITOT 1.0 11/04/2023   Lab Results  Component Value Date   MICROALBUR <0.7 11/04/2023   MICROALBUR <0.7 10/21/2022    Lab Results  Component Value Date   CREATININE 1.05 11/04/2023

## 2023-11-06 NOTE — Assessment & Plan Note (Addendum)
 Surveillance is ongoing managed by Dr . Maria Shiner  , last mammogram was normal  Sept 2024

## 2023-11-06 NOTE — Assessment & Plan Note (Signed)
 Thyroid  function is normal and she has no symptoms of overactive thyroid    Lab Results  Component Value Date   TSH 0.54 11/04/2023

## 2023-11-06 NOTE — Assessment & Plan Note (Addendum)
 Electrolytes normal .  Stretching exercises demonstrated   Lab Results  Component Value Date   NA 139 11/04/2023   K 4.0 11/04/2023   CL 105 11/04/2023   CO2 23 11/04/2023   No results found for: "CKTOTAL", "CKMB", "CKMBINDEX", "TROPONINI"

## 2023-11-07 ENCOUNTER — Encounter: Payer: Self-pay | Admitting: Internal Medicine

## 2023-11-08 ENCOUNTER — Encounter: Payer: Self-pay | Admitting: Internal Medicine

## 2023-11-11 DIAGNOSIS — M546 Pain in thoracic spine: Secondary | ICD-10-CM | POA: Diagnosis not present

## 2023-11-11 DIAGNOSIS — M542 Cervicalgia: Secondary | ICD-10-CM | POA: Diagnosis not present

## 2023-11-11 DIAGNOSIS — M9903 Segmental and somatic dysfunction of lumbar region: Secondary | ICD-10-CM | POA: Diagnosis not present

## 2023-11-11 DIAGNOSIS — M62830 Muscle spasm of back: Secondary | ICD-10-CM | POA: Diagnosis not present

## 2023-12-09 DIAGNOSIS — M542 Cervicalgia: Secondary | ICD-10-CM | POA: Diagnosis not present

## 2023-12-09 DIAGNOSIS — M546 Pain in thoracic spine: Secondary | ICD-10-CM | POA: Diagnosis not present

## 2023-12-09 DIAGNOSIS — M62830 Muscle spasm of back: Secondary | ICD-10-CM | POA: Diagnosis not present

## 2023-12-09 DIAGNOSIS — M9903 Segmental and somatic dysfunction of lumbar region: Secondary | ICD-10-CM | POA: Diagnosis not present

## 2023-12-21 DIAGNOSIS — M1812 Unilateral primary osteoarthritis of first carpometacarpal joint, left hand: Secondary | ICD-10-CM | POA: Diagnosis not present

## 2023-12-21 DIAGNOSIS — M1810 Unilateral primary osteoarthritis of first carpometacarpal joint, unspecified hand: Secondary | ICD-10-CM | POA: Diagnosis not present

## 2023-12-28 ENCOUNTER — Telehealth: Payer: Self-pay

## 2023-12-28 ENCOUNTER — Other Ambulatory Visit: Payer: Self-pay | Admitting: Internal Medicine

## 2023-12-28 DIAGNOSIS — E1169 Type 2 diabetes mellitus with other specified complication: Secondary | ICD-10-CM

## 2023-12-28 NOTE — Telephone Encounter (Signed)
 Copied from CRM [id number]. Topic: Clinical - Request for Lab/Test Order >> Dec 28, 2023  2:48 PM Almira Coaster wrote: Reason for CRM: Patient is scheduled for a 3 month f/u with Dr.Tullo on 02/11/2024 at 2:00 pm. She would like to know if Dr.Tullo will be requesting labs and if so does she need to be fasting. If the labs are fasting then she would like to schedule an appointment a lab appointment a couple days prior so she doesn't go without food until 2 pm.

## 2023-12-29 NOTE — Addendum Note (Signed)
 Addended by: Sandy Salaam on: 12/29/2023 05:16 PM   Modules accepted: Orders

## 2023-12-29 NOTE — Telephone Encounter (Signed)
 Spoke with pt and informed her that Dr. Darrick Huntsman does wanting fasting labs but that she only needs to be fasting for 4 hours. Pt stated that she will fast for 4  hours and get labs done at her appt.

## 2024-01-06 DIAGNOSIS — M546 Pain in thoracic spine: Secondary | ICD-10-CM | POA: Diagnosis not present

## 2024-01-06 DIAGNOSIS — M62830 Muscle spasm of back: Secondary | ICD-10-CM | POA: Diagnosis not present

## 2024-01-06 DIAGNOSIS — M9903 Segmental and somatic dysfunction of lumbar region: Secondary | ICD-10-CM | POA: Diagnosis not present

## 2024-01-06 DIAGNOSIS — M542 Cervicalgia: Secondary | ICD-10-CM | POA: Diagnosis not present

## 2024-01-26 ENCOUNTER — Ambulatory Visit: Admitting: Podiatry

## 2024-02-03 DIAGNOSIS — M546 Pain in thoracic spine: Secondary | ICD-10-CM | POA: Diagnosis not present

## 2024-02-03 DIAGNOSIS — M62830 Muscle spasm of back: Secondary | ICD-10-CM | POA: Diagnosis not present

## 2024-02-03 DIAGNOSIS — M542 Cervicalgia: Secondary | ICD-10-CM | POA: Diagnosis not present

## 2024-02-03 DIAGNOSIS — M9903 Segmental and somatic dysfunction of lumbar region: Secondary | ICD-10-CM | POA: Diagnosis not present

## 2024-02-04 ENCOUNTER — Encounter: Payer: Self-pay | Admitting: Podiatry

## 2024-02-04 ENCOUNTER — Ambulatory Visit (INDEPENDENT_AMBULATORY_CARE_PROVIDER_SITE_OTHER): Admitting: Podiatry

## 2024-02-04 ENCOUNTER — Ambulatory Visit: Payer: PPO | Admitting: Internal Medicine

## 2024-02-04 ENCOUNTER — Ambulatory Visit (INDEPENDENT_AMBULATORY_CARE_PROVIDER_SITE_OTHER)

## 2024-02-04 ENCOUNTER — Encounter (HOSPITAL_COMMUNITY): Payer: Self-pay

## 2024-02-04 DIAGNOSIS — M7752 Other enthesopathy of left foot: Secondary | ICD-10-CM

## 2024-02-04 DIAGNOSIS — M25572 Pain in left ankle and joints of left foot: Secondary | ICD-10-CM | POA: Diagnosis not present

## 2024-02-04 MED ORDER — BETAMETHASONE SOD PHOS & ACET 6 (3-3) MG/ML IJ SUSP
3.0000 mg | Freq: Once | INTRAMUSCULAR | Status: AC
Start: 2024-02-04 — End: 2024-02-04
  Administered 2024-02-04: 3 mg via INTRA_ARTICULAR

## 2024-02-04 NOTE — Progress Notes (Signed)
 Chief Complaint  Patient presents with   Foot Pain    "I have pain in my ankle and this vein hurts."    Subjective:  83 y.o. female presenting today for follow-up evaluation of pain to the left ankle joint.  Patient states that the injections in the past have helped significantly.  Slowly over the last few months she has noticed increased pain.  Presenting for further treatment and evaluation   Past Medical History:  Diagnosis Date   Arthritis    breast cancer 2005   bilateral   Cataract 01/04/13 and 03/01/13   Complication of anesthesia    dizziness    Hyperlipidemia    hypothyroidism    Hypothyroidism    Personal history of chemotherapy    Personal history of radiation therapy    PONV (postoperative nausea and vomiting)    Pre-diabetes    Sleep apnea    mild diagnosis no cpap   Tear of left biceps muscle 01/08/2021   Tinnitus    Past Surgical History:  Procedure Laterality Date   bilateral knee replacement s     BREAST BIOPSY Left 2005   positive   BREAST BIOPSY Right 2005   positive   BREAST LUMPECTOMY Left 2005   BREAST LUMPECTOMY Right 2005   gumm surgery      JOINT REPLACEMENT  2013   by Dr. Aubry Blase   righ tknee arthroscopy      TONSILLECTOMY     TOTAL HIP ARTHROPLASTY Right 11/12/2020   Procedure: RIGHT TOTAL HIP ARTHROPLASTY ANTERIOR APPROACH;  Surgeon: Dayne Even, MD;  Location: [facility];  Service: Orthopedics;  Laterality: Right;   TOTAL HIP ARTHROPLASTY Left 11/25/2021   Procedure: LEFT TOTAL HIP ARTHROPLASTY ANTERIOR APPROACH;  Surgeon: Dayne Even, MD;  Location: [facility];  Service: Orthopedics;  Laterality: Left;   Allergies  Allergen Reactions   Codeine Shortness Of Breath and Other (See Comments)    Altered mental staus   Ilevro [Nepafenac] Other (See Comments)    Eye swelling Eye drops    Other Swelling    DOGS.  Congestion. FEATHERS.  Congestion  Can not take preservative free eye drops     Maxitrol [Neomycin-Polymyxin-Dexameth]      dry eyes    Metformin  And Related     Shakiness    Augmentin  [Amoxicillin -Pot Clavulanate] Itching and Rash   Molds & Smuts Other (See Comments)    Congestion.   Neomycin Rash   Neomycin-Bacitracin Zn-Polymyx Rash   Neomycin-Polymyxin-Hc Rash   Tape Dermatitis    Blisters   Tapentadol Nausea And Vomiting    Nucynta   Terbinafine  Rash    Trouble swallowing   Tramadol Other (See Comments), Nausea Only and Nausea And Vomiting    dizziness     Objective / Physical Exam:  General:  The patient is alert and oriented x3 in no acute distress. Dermatology:  Skin is warm, dry and supple bilateral lower extremities. Negative for open lesions or macerations. Vascular:  Palpable pedal pulses bilaterally. No edema or erythema noted. Capillary refill within normal limits.  Varicose vein noted to the dorsum of the left foot Neurological:  Grossly intact via light touch Musculoskeletal Exam:  Tenderness on palpation to the sinus tarsi of the left foot  Radiographic Exam LT ankle 04/21/2022:  Normal osseous mineralization. Joint spaces preserved.  There may be some slight joint space narrowing to the medial gutter of the tibiotalar joint. No fracture/dislocation/boney destruction.    Assessment: 1.  DJD left  ankle 2.  Sinus tarsitis left  3.  Varicosity left foot  -Patient evaluated -Injection 0.5 cc Celestone  Soluspan injected in the sinus tarsi left -Continue wearing good supportive sneakers in tennis shoes - Patient states that she does have renal insufficiency.  No NSAIDs.  Recommend OTC Tylenol  arthritis as needed -Compression ankle sleeve dispensed.  Wear daily -Return to clinic as needed   Dot Gazella, DPM [facility]  Dr. Dot Gazella, DPM    [address] [place], [place] [address]                Office [phone]  Fax [phone]

## 2024-02-09 DIAGNOSIS — M25552 Pain in left hip: Secondary | ICD-10-CM | POA: Diagnosis not present

## 2024-02-10 ENCOUNTER — Other Ambulatory Visit: Payer: Self-pay | Admitting: Internal Medicine

## 2024-02-10 DIAGNOSIS — M542 Cervicalgia: Secondary | ICD-10-CM | POA: Diagnosis not present

## 2024-02-10 DIAGNOSIS — M62830 Muscle spasm of back: Secondary | ICD-10-CM | POA: Diagnosis not present

## 2024-02-10 DIAGNOSIS — M546 Pain in thoracic spine: Secondary | ICD-10-CM | POA: Diagnosis not present

## 2024-02-10 DIAGNOSIS — M9903 Segmental and somatic dysfunction of lumbar region: Secondary | ICD-10-CM | POA: Diagnosis not present

## 2024-02-11 ENCOUNTER — Ambulatory Visit: Admitting: Internal Medicine

## 2024-02-11 ENCOUNTER — Encounter: Payer: Self-pay | Admitting: Internal Medicine

## 2024-02-11 VITALS — BP 124/72 | HR 75 | Ht 65.0 in | Wt 163.4 lb

## 2024-02-11 DIAGNOSIS — E1169 Type 2 diabetes mellitus with other specified complication: Secondary | ICD-10-CM | POA: Diagnosis not present

## 2024-02-11 DIAGNOSIS — E119 Type 2 diabetes mellitus without complications: Secondary | ICD-10-CM

## 2024-02-11 DIAGNOSIS — M79645 Pain in left finger(s): Secondary | ICD-10-CM | POA: Diagnosis not present

## 2024-02-11 DIAGNOSIS — E785 Hyperlipidemia, unspecified: Secondary | ICD-10-CM

## 2024-02-11 DIAGNOSIS — E1122 Type 2 diabetes mellitus with diabetic chronic kidney disease: Secondary | ICD-10-CM

## 2024-02-11 DIAGNOSIS — R748 Abnormal levels of other serum enzymes: Secondary | ICD-10-CM | POA: Diagnosis not present

## 2024-02-11 DIAGNOSIS — N1831 Chronic kidney disease, stage 3a: Secondary | ICD-10-CM

## 2024-02-11 MED ORDER — EZETIMIBE 10 MG PO TABS: 10.0000 mg | ORAL_TABLET | Freq: Every day | ORAL | 1 refills | Status: DC

## 2024-02-11 NOTE — Progress Notes (Signed)
 Subjective:  Patient ID: Jacqueline Mata, female    DOB: 24-Oct-1940  Age: 83 y.o. MRN: [id number]  CC: The primary encounter diagnosis was Hyperlipidemia associated with type 2 diabetes mellitus (HCC). Diagnoses of Diabetes mellitus type 2, diet-controlled (HCC), Elevated liver enzymes, CKD stage 3a, GFR 45-59 ml/min (HCC), Pain of left thumb, and Type 2 diabetes mellitus with stage 3a chronic kidney disease, without long-term current use of insulin (HCC) were also pertinent to this visit.   HPI Jacqueline Mata presents for  Chief Complaint  Patient presents with   Medical Management of Chronic Issues    3 month follow up    1) TYPE 2 DM :  She feels generally well, is walking for exercise several times per week and checking blood sugars once daily in a fasting state .  BS have been under 130 fasting  Denies any recent hypoglyemic events.  Taking his medications as directed. Following a carbohydrate modified diet 6 days per week. Denies numbness, burning and tingling of extremities. Appetite is good.  using a1 calculator predicting an A1c of 6.7   2) OA  THUMB : HAD INJECTION IN LEFT THUMB AND GIVEN A SPLINT BY [facility]; injection provided only a few days of relief. .  She is getting a second opinion with a HAND SPECIALIST AT [facility]  3) varicose veins of left foot:  not painful, as  some pedal edema at end of day  4) vitamin d  supplementation ,  curious about supplements containing "K2"  wondering if she should take this.   Lab Results  Component Value Date   HGBA1C 6.8 (H) 02/11/2024     Lab Results  Component Value Date   MICROALBUR <0.7 11/04/2023   MICROALBUR <0.7 10/21/2022      Outpatient Medications Prior to Visit  Medication Sig Dispense Refill   amoxicillin  (AMOXIL ) 500 MG capsule Take 2,000 mg by mouth See admin instructions. Dental procedures. Takes 2000mg  one hour prior to dental procedures.     ascorbic acid (VITAMIN C) 500 MG tablet Take 500  mg by mouth daily.     blood glucose meter kit and supplies Dispense based on patient and insurance preference. Use to check blood sugars one time daily. (ICD-10 E11.9). 1 each 0   Cholecalciferol (VITAMIN D ) 50 MCG (2000 UT) tablet Take 2,000 Units by mouth daily.     COLLAGEN PO Take 1 Scoop by mouth every other day. With Probiotic     fexofenadine  (ALLEGRA  ALLERGY) 60 MG tablet Take 1 tablet (60 mg total) by mouth 2 (two) times daily. 180 tablet 1   fluconazole (DIFLUCAN) 200 MG tablet Take 200 mg by mouth once a week.     folic acid  (FOLVITE ) 800 MCG tablet Take 800 mcg by mouth daily.     levothyroxine  (SYNTHROID ) 88 MCG tablet TAKE ONE TABLET EVERY DAY BEFORE BREAKFAST 90 tablet 1   MAGNESIUM GLUCONATE PO Take 1 capsule by mouth daily.     metroNIDAZOLE (METROCREAM) 0.75 % cream Apply 1 Application topically 2 (two) times daily.     ONETOUCH VERIO test strip USE TO CHECK BLOOD SUGARS TWICE DAILY 100 each 12   Propylene Glycol (SYSTANE BALANCE) 0.6 % SOLN Place 1 drop into both eyes daily as needed (dry eyes).     vitamin B-12 (CYANOCOBALAMIN ) 1000 MCG tablet Take 1,000 mcg by mouth daily.     ezetimibe  (ZETIA ) 10 MG tablet TAKE 1 TABLET BY MOUTH ONCE DAILY 90 tablet 1  Lancets (ONETOUCH DELICA PLUS LANCET33G) MISC Use to check blood sugars twice daily. 100 each 3   glucose blood (ONETOUCH VERIO) test strip Use to check blood sugar once daily. 100 strip 3   Lancets (ONETOUCH DELICA PLUS LANCET33G) MISC USE TO CHECK BLOOD SUGAR ONCE DAILY 100 each 0   OVER THE COUNTER MEDICATION Place 2 sprays into the nose daily. Sovereign Silver Nasal Spray (Immune Support)     No facility-administered medications prior to visit.    Review of Systems;  Patient denies headache, fevers, malaise, unintentional weight loss, skin rash, eye pain, sinus congestion and sinus pain, sore throat, dysphagia,  hemoptysis , cough, dyspnea, wheezing, chest pain, palpitations, orthopnea, edema, abdominal pain, nausea,  melena, diarrhea, constipation, flank pain, dysuria, hematuria, urinary  Frequency, nocturia, numbness, tingling, seizures,  Focal weakness, Loss of consciousness,  Tremor, insomnia, depression, anxiety, and suicidal ideation.      Objective:  BP 124/72   Pulse 75   Ht 5\' 5"  (1.651 m)   Wt 163 lb 6.4 oz (74.1 kg)   SpO2 94%   BMI 27.19 kg/m   BP Readings from Last 3 Encounters:  02/11/24 124/72  11/04/23 120/80  09/02/23 113/75    Wt Readings from Last 3 Encounters:  02/11/24 163 lb 6.4 oz (74.1 kg)  11/04/23 164 lb 12.8 oz (74.8 kg)  09/02/23 166 lb (75.3 kg)    Physical Exam Vitals reviewed.  Constitutional:      General: She is not in acute distress.    Appearance: Normal appearance. She is normal weight. She is not ill-appearing, toxic-appearing or diaphoretic.  HENT:     Head: Normocephalic.  Eyes:     General: No scleral icterus.       Right eye: No discharge.        Left eye: No discharge.     Conjunctiva/sclera: Conjunctivae normal.  Cardiovascular:     Rate and Rhythm: Normal rate and regular rhythm.     Heart sounds: Normal heart sounds.  Pulmonary:     Effort: Pulmonary effort is normal. No respiratory distress.     Breath sounds: Normal breath sounds.  Musculoskeletal:        General: Normal range of motion.  Skin:    General: Skin is warm and dry.  Neurological:     General: No focal deficit present.     Mental Status: She is alert and oriented to person, place, and time. Mental status is at baseline.  Psychiatric:        Mood and Affect: Mood normal.        Behavior: Behavior normal.        Thought Content: Thought content normal.        Judgment: Judgment normal.     Lab Results  Component Value Date   HGBA1C 6.8 (H) 02/11/2024   HGBA1C 7.2 (H) 11/04/2023   HGBA1C 6.5 (A) 05/03/2023    Lab Results  Component Value Date   CREATININE 1.01 (H) 02/11/2024   CREATININE 1.05 11/04/2023   CREATININE 1.05 (H) 09/02/2023    Lab Results   Component Value Date   WBC 8.9 09/02/2023   HGB 14.4 09/02/2023   HCT 42.1 09/02/2023   PLT 172 09/02/2023   GLUCOSE 98 02/11/2024   CHOL 213 (H) 02/11/2024   TRIG 89 02/11/2024   HDL 87 02/11/2024   LDLDIRECT 114 (H) 02/11/2024   LDLCALC 110 (H) 02/11/2024   ALT 37 (H) 02/11/2024   AST 24 02/11/2024  NA 141 02/11/2024   K 4.4 02/11/2024   CL 103 02/11/2024   CREATININE 1.01 (H) 02/11/2024   BUN 27 02/11/2024   CO2 20 02/11/2024   TSH 0.54 11/04/2023   INR 1.1 11/05/2020   HGBA1C 6.8 (H) 02/11/2024   MICROALBUR <0.7 11/04/2023    MM 3D SCREENING MAMMOGRAM BILATERAL BREAST Result Date: 06/11/2023 CLINICAL DATA:  Screening. EXAM: DIGITAL SCREENING BILATERAL MAMMOGRAM WITH TOMOSYNTHESIS AND CAD TECHNIQUE: Bilateral screening digital craniocaudal and mediolateral oblique mammograms were obtained. Bilateral screening digital breast tomosynthesis was performed. The images were evaluated with computer-aided detection. COMPARISON:  Previous exam(s). ACR Breast Density Category b: There are scattered areas of fibroglandular density. FINDINGS: There are no findings suspicious for malignancy. IMPRESSION: No mammographic evidence of malignancy. A result letter of this screening mammogram will be mailed directly to the patient. RECOMMENDATION: Screening mammogram in one year. (Code:[id number]-B-01Y) BI-RADS CATEGORY  1: Negative. Electronically Signed   By: Dobrinka  Dimitrova M.D.   On: 06/11/2023 15:16    Assessment & Plan:  .Hyperlipidemia associated with type 2 diabetes mellitus (HCC) Assessment & Plan: Complicated by statin myalgia.  She is Tolerating  zetia  but LDL is considerably above goal.  Will discuss adding PCSK9  inhibitor if insurance will allow.     Lab Results  Component Value Date   HGBA1C 6.8 (H) 02/11/2024   Lab Results  Component Value Date   CHOL 213 (H) 02/11/2024   HDL 87 02/11/2024   LDLCALC 110 (H) 02/11/2024   LDLDIRECT 114 (H) 02/11/2024   TRIG 89 02/11/2024    CHOLHDL 2.4 02/11/2024   Lab Results  Component Value Date   ALT 37 (H) 02/11/2024   AST 24 02/11/2024   ALKPHOS 79 02/11/2024   BILITOT 0.8 02/11/2024   Lab Results  Component Value Date   MICROALBUR <0.7 11/04/2023   MICROALBUR <0.7 10/21/2022    Lab Results  Component Value Date   CREATININE 1.01 (H) 02/11/2024      Orders: -     Lipid panel -     LDL cholesterol, direct  Diabetes mellitus type 2, diet-controlled (HCC) -     Hemoglobin A1c -     Comprehensive metabolic panel with GFR  Elevated liver enzymes Assessment & Plan: Intermittent.  Needs liver ultrasound  Lab Results  Component Value Date   ALT 37 (H) 02/11/2024   AST 24 02/11/2024   ALKPHOS 79 02/11/2024   BILITOT 0.8 02/11/2024      CKD stage 3a, GFR 45-59 ml/min [facility]) Assessment & Plan: Attributed to use of meloxicam  per review of last  nephrology note Feb 2024  Dr [facility].   GFR remains < 60 ml/min but > 45  on recent CMP.  .   Lab Results  Component Value Date   CREATININE 1.01 (H) 02/11/2024   Lab Results  Component Value Date   MICROALBUR <0.7 11/04/2023   MICROALBUR <0.7 10/21/2022       Pain of left thumb Assessment & Plan: Reviewed March 2025 orthopedics evaluation be Fr Celestino Cole at [facility]: ;  she has advanced CMC arthritis of the left thumb with involvement of the STT joint. Findings include significant osteophytosis and extension of the MP joint. Notably, there is no significant arthritis of the MP joint.  She received an  I/a  injection  with celestone  which provided transient minimal  relief.   She has scheduled an appt with [facility] hand specialist  for  a second opinion.    Type 2  diabetes mellitus with stage 3a chronic kidney disease, without long-term current use of insulin (HCC) Assessment & Plan: A1c has improved with careful diet.  She has no proteinuria and ho history of hypertension.  Statin intolerant.  Tolerating zetia  but LDL is not at goal.  No prior  cardiac workup .  Will lkiekly require cardiac workup for insurance to cover Repatha  Lab Results  Component Value Date   HGBA1C 6.8 (H) 02/11/2024   Lab Results  Component Value Date   MICROALBUR <0.7 11/04/2023   MICROALBUR <0.7 10/21/2022     Lab Results  Component Value Date   CHOL 213 (H) 02/11/2024   HDL 87 02/11/2024   LDLCALC 110 (H) 02/11/2024   LDLDIRECT 114 (H) 02/11/2024   TRIG 89 02/11/2024   CHOLHDL 2.4 02/11/2024      Other orders -     Ezetimibe ; Take 1 tablet (10 mg total) by mouth daily.  Dispense: 90 tablet; Refill: 1     I spent 34 minutes on the day of this face to face encounter reviewing patient's  most recent visit with orthopedics , nephrology,   prior relevant surgical and non surgical procedures, recent  labs and imaging studies, counseling on diabetes management,  reviewing the assessment and plan with patient, and post visit ordering and reviewing of  diagnostics and therapeutics with patienttes   .   Follow-up: Return in about 3 months (around 05/13/2024) for follow up diabetes.   Thersia Flax, MD

## 2024-02-11 NOTE — Patient Instructions (Addendum)
 TRY USING VOLTAREN  UP TO 4 TIMES DAILY on your thumb joint.   TURMERIC WITH CUMARIN  AND FISH OIL ARE ALL  GOOD NATURAL SUPPLEMEN. yTS FOR ARTHRITIS   There is No need for a separate k2 supplement   .  Too much can cause blood clots   Saline irrigation of sinuses after outside work may prevent sinus infections

## 2024-02-12 ENCOUNTER — Encounter: Payer: Self-pay | Admitting: Internal Medicine

## 2024-02-12 LAB — COMPREHENSIVE METABOLIC PANEL WITH GFR
ALT: 37 IU/L — ABNORMAL HIGH (ref 0–32)
AST: 24 IU/L (ref 0–40)
Albumin: 4.6 g/dL (ref 3.7–4.7)
Alkaline Phosphatase: 79 IU/L (ref 44–121)
BUN/Creatinine Ratio: 27 (ref 12–28)
BUN: 27 mg/dL (ref 8–27)
Bilirubin Total: 0.8 mg/dL (ref 0.0–1.2)
CO2: 20 mmol/L (ref 20–29)
Calcium: 10 mg/dL (ref 8.7–10.3)
Chloride: 103 mmol/L (ref 96–106)
Creatinine, Ser: 1.01 mg/dL — ABNORMAL HIGH (ref 0.57–1.00)
Globulin, Total: 2.3 g/dL (ref 1.5–4.5)
Glucose: 98 mg/dL (ref 70–99)
Potassium: 4.4 mmol/L (ref 3.5–5.2)
Sodium: 141 mmol/L (ref 134–144)
Total Protein: 6.9 g/dL (ref 6.0–8.5)
eGFR: 56 mL/min/{1.73_m2} — ABNORMAL LOW (ref >59)

## 2024-02-12 LAB — LIPID PANEL
Chol/HDL Ratio: 2.4 ratio (ref 0.0–4.4)
Cholesterol, Total: 213 mg/dL — ABNORMAL HIGH (ref 100–199)
HDL: 87 mg/dL (ref >39)
LDL Chol Calc (NIH): 110 mg/dL — ABNORMAL HIGH (ref 0–99)
Triglycerides: 89 mg/dL (ref 0–149)
VLDL Cholesterol Cal: 16 mg/dL (ref 5–40)

## 2024-02-12 LAB — HEMOGLOBIN A1C
Est. average glucose Bld gHb Est-mCnc: 148 mg/dL
Hgb A1c MFr Bld: 6.8 % — ABNORMAL HIGH (ref 4.8–5.6)

## 2024-02-12 LAB — LDL CHOLESTEROL, DIRECT: LDL Direct: 114 mg/dL — ABNORMAL HIGH (ref 0–99)

## 2024-02-13 ENCOUNTER — Ambulatory Visit: Payer: Self-pay | Admitting: Internal Medicine

## 2024-02-13 DIAGNOSIS — E1122 Type 2 diabetes mellitus with diabetic chronic kidney disease: Secondary | ICD-10-CM | POA: Insufficient documentation

## 2024-02-13 DIAGNOSIS — M79645 Pain in left finger(s): Secondary | ICD-10-CM | POA: Insufficient documentation

## 2024-02-13 NOTE — Assessment & Plan Note (Addendum)
 Attributed to use of meloxicam  per review of last  nephrology note Feb 2024  [facility] ([place]).   GFR remains < 60 ml/min but > 45  on recent CMP.  .   Lab Results  Component Value Date   CREATININE 1.01 (H) 02/11/2024   Lab Results  Component Value Date   MICROALBUR <0.7 11/04/2023   MICROALBUR <0.7 10/21/2022

## 2024-02-13 NOTE — Assessment & Plan Note (Signed)
 Complicated by statin myalgia.  She is Tolerating  zetia  but LDL is considerably above goal.  Will discuss adding PCSK9  inhibitor if insurance will allow.     Lab Results  Component Value Date   HGBA1C 6.8 (H) 02/11/2024   Lab Results  Component Value Date   CHOL 213 (H) 02/11/2024   HDL 87 02/11/2024   LDLCALC 110 (H) 02/11/2024   LDLDIRECT 114 (H) 02/11/2024   TRIG 89 02/11/2024   CHOLHDL 2.4 02/11/2024   Lab Results  Component Value Date   ALT 37 (H) 02/11/2024   AST 24 02/11/2024   ALKPHOS 79 02/11/2024   BILITOT 0.8 02/11/2024   Lab Results  Component Value Date   MICROALBUR <0.7 11/04/2023   MICROALBUR <0.7 10/21/2022    Lab Results  Component Value Date   CREATININE 1.01 (H) 02/11/2024

## 2024-02-13 NOTE — Assessment & Plan Note (Signed)
 A1c has improved with careful diet.  She has no proteinuria and ho history of hypertension.  Statin intolerant.  Tolerating zetia  but LDL is not at goal.  No prior cardiac workup .  Will lkiekly require cardiac workup for insurance to cover Repatha  Lab Results  Component Value Date   HGBA1C 6.8 (H) 02/11/2024   Lab Results  Component Value Date   MICROALBUR <0.7 11/04/2023   MICROALBUR <0.7 10/21/2022     Lab Results  Component Value Date   CHOL 213 (H) 02/11/2024   HDL 87 02/11/2024   LDLCALC 110 (H) 02/11/2024   LDLDIRECT 114 (H) 02/11/2024   TRIG 89 02/11/2024   CHOLHDL 2.4 02/11/2024

## 2024-02-13 NOTE — Assessment & Plan Note (Signed)
 Intermittent.  Needs liver ultrasound  Lab Results  Component Value Date   ALT 37 (H) 02/11/2024   AST 24 02/11/2024   ALKPHOS 79 02/11/2024   BILITOT 0.8 02/11/2024

## 2024-02-13 NOTE — Assessment & Plan Note (Signed)
 Reviewed March 2025 orthopedics evaluation be Fr Celestino Cole at [facility]: ;  she has advanced CMC arthritis of the left thumb with involvement of the STT joint. Findings include significant osteophytosis and extension of the MP joint. Notably, there is no significant arthritis of the MP joint.  She received an  I/a  injection  with celestone  which provided transient minimal  relief.   She has scheduled an appt with [facility] hand specialist  for  a second opinion.

## 2024-02-14 DIAGNOSIS — M546 Pain in thoracic spine: Secondary | ICD-10-CM | POA: Diagnosis not present

## 2024-02-14 DIAGNOSIS — M9903 Segmental and somatic dysfunction of lumbar region: Secondary | ICD-10-CM | POA: Diagnosis not present

## 2024-02-14 DIAGNOSIS — M62830 Muscle spasm of back: Secondary | ICD-10-CM | POA: Diagnosis not present

## 2024-02-14 DIAGNOSIS — M542 Cervicalgia: Secondary | ICD-10-CM | POA: Diagnosis not present

## 2024-02-15 DIAGNOSIS — E119 Type 2 diabetes mellitus without complications: Secondary | ICD-10-CM | POA: Diagnosis not present

## 2024-02-15 DIAGNOSIS — Z961 Presence of intraocular lens: Secondary | ICD-10-CM | POA: Diagnosis not present

## 2024-02-15 LAB — HM DIABETES EYE EXAM

## 2024-02-17 ENCOUNTER — Ambulatory Visit
Admission: RE | Admit: 2024-02-17 | Discharge: 2024-02-17 | Disposition: A | Discharge status: Home / Self Care | Source: Ambulatory Visit | Attending: Internal Medicine | Admitting: Internal Medicine

## 2024-02-17 DIAGNOSIS — K824 Cholesterolosis of gallbladder: Secondary | ICD-10-CM | POA: Diagnosis not present

## 2024-02-17 DIAGNOSIS — R748 Abnormal levels of other serum enzymes: Secondary | ICD-10-CM | POA: Diagnosis not present

## 2024-02-21 ENCOUNTER — Encounter: Payer: Self-pay | Admitting: Internal Medicine

## 2024-02-21 DIAGNOSIS — Z789 Other specified health status: Secondary | ICD-10-CM

## 2024-02-23 MED ORDER — ROSUVASTATIN CALCIUM 10 MG PO TABS: 10.0000 mg | ORAL_TABLET | ORAL | 1 refills | Status: DC

## 2024-02-24 ENCOUNTER — Ambulatory Visit: Payer: PPO | Admitting: Internal Medicine

## 2024-02-27 NOTE — Therapy (Signed)
 OUTPATIENT PHYSICAL THERAPY HIP EVALUATION   Patient Name: Jacqueline Mata MRN: [id number] DOB:09/10/1941, 83 y.o., female Today's Date: 02/29/2024  END OF SESSION:  PT End of Session - 02/29/24 1029     Visit Number 1    Number of Visits 25    Date for PT Re-Evaluation 05/23/24    PT Start Time 1030    PT Stop Time 1116    PT Time Calculation (min) 46 min    Activity Tolerance Patient tolerated treatment well;No increased pain    Behavior During Therapy [facility] for tasks assessed/performed             Past Medical History:  Diagnosis Date   Arthritis    breast cancer 2005   bilateral   Cataract 01/04/13 and 03/01/13   Complication of anesthesia    dizziness    Hyperlipidemia    hypothyroidism    Hypothyroidism    Personal history of chemotherapy    Personal history of radiation therapy    PONV (postoperative nausea and vomiting)    Pre-diabetes    Sleep apnea    mild diagnosis no cpap   Tear of left biceps muscle 01/08/2021   Tinnitus    Past Surgical History:  Procedure Laterality Date   bilateral knee replacement s     BREAST BIOPSY Left 2005   positive   BREAST BIOPSY Right 2005   positive   BREAST LUMPECTOMY Left 2005   BREAST LUMPECTOMY Right 2005   gumm surgery      JOINT REPLACEMENT  2013   by Dr. Aubry Blase   righ tknee arthroscopy      TONSILLECTOMY     TOTAL HIP ARTHROPLASTY Right 11/12/2020   Procedure: RIGHT TOTAL HIP ARTHROPLASTY ANTERIOR APPROACH;  Surgeon: Dayne Even, MD;  Location: [facility];  Service: Orthopedics;  Laterality: Right;   TOTAL HIP ARTHROPLASTY Left 11/25/2021   Procedure: LEFT TOTAL HIP ARTHROPLASTY ANTERIOR APPROACH;  Surgeon: Dayne Even, MD;  Location: [facility];  Service: Orthopedics;  Laterality: Left;   Patient Active Problem List   Diagnosis Date Noted   Pain of left thumb 02/13/2024   Type 2 diabetes mellitus with diabetic chronic kidney disease (HCC) 02/13/2024   Nocturnal leg cramps 11/06/2023   Seasonal allergies  06/20/2023   Sensorineural hearing loss (SNHL) of both ears 10/21/2022   Primary localized osteoarthritis of left hip 11/25/2021   Rosacea 11/10/2021   Chronic hip pain, left 11/10/2021   Chronic pain in left shoulder 05/06/2021   CKD stage 3a, GFR 45-59 ml/min (HCC) 05/06/2021   Mixed stress and urge urinary incontinence 05/06/2021   Pain, dental 02/10/2021   Preoperative evaluation to rule out surgical contraindication 11/04/2020   Primary osteoarthritis of right hip 07/20/2020   Pre-ulcerative corn or callous 07/09/2020   Elevated liver enzymes 04/16/2020   Short-term memory loss 07/31/2019   Educated about COVID-19 virus infection 01/30/2019   Osteopenia determined by x-ray 03/02/2018   Myalgia due to statin 01/02/2018   Stage 2 carcinoma of breast, ER+, unspecified laterality (HCC) 04/29/2017   Onychomycosis of great toe 03/19/2016   Cervical radiculopathy due to degenerative joint disease of spine 03/19/2015   Acromioclavicular joint arthritis 02/19/2015   Rotator cuff dysfunction 01/08/2015   Shoulder pain, bilateral 12/21/2014   Sciatica of left side 10/17/2014   Encounter for Medicare annual wellness exam 05/22/2014   Basal cell carcinoma of leg 12/20/2013   Overweight (BMI 25.0-29.9) 08/08/2013   Balance problem due to vestibular dysfunction 05/01/2012  Status post total knee replacement 05/01/2012   History of breast cancer 11/09/2011   Acquired hypothyroidism 07/26/2011   Hyperlipidemia associated with type 2 diabetes mellitus (HCC) 07/23/2011    PCP: Thersia Flax, MD  REFERRING PROVIDER: Dayne Even, MD  REFERRING DIAG: [phone] (ICD-10-CM) - Injury of left quadriceps femoris muscle   RATIONALE FOR EVALUATION AND TREATMENT: Rehabilitation  THERAPY DIAG: Pain in left leg  Other abnormalities of gait and mobility  Muscle weakness (generalized)  ONSET DATE: December 22 2023  FOLLOW-UP APPT SCHEDULED WITH REFERRING PROVIDER: Didn't  address   SUBJECTIVE:                                                                                                                                                                                         SUBJECTIVE STATEMENT:  Patient with chief concern of L thigh pain  PERTINENT HISTORY:   Patient is a 83 y.o. female reporting to OPPT with L thigh pain. Patient reports on palm Sunday during a choir performance she was in a static partial forward lunge with a torso rotation (R leg in front). She reports holding that position for a prolonged time and turned toward the choir director and experienced a sharp pain. Following that day she saw her surgeon regarding hip concerns; pt reports that physician cleared her any hip diagnosis or pain stemming from gluteal musculature. Additionally she spoke with her chiropractor; only findings discussed was leg length discrepancy (R shorter than L). She describes her pain as sharp and aching with specific movements and it remains constant. Aggravating factors involve prolonged walking and standing. Pain is alleviated with rest or having the L leg extended and supported on a stool. Patient reports that she has some difficulty with lifting L leg when turning to another direction. Currently she denies numbness, tingling, changes in b/b.   PAIN:    Pain Intensity: Present: 3/10, Best: 2/10, Worst: 4/10 Pain location: L Proximal thigh  Pain Quality: constant, sharp, and aching  Radiating: No  Numbness/Tingling: No Focal Weakness:  Relieving factors: Resting  How long can you sit:  How long can you stand: < 30 min  History of prior back or hip injury, pain, surgery, or therapy: Yes Red flags: Negative for bowel/bladder changes.  PRECAUTIONS: Fall  WEIGHT BEARING RESTRICTIONS: No  FALLS: Has patient fallen in last 6 months? 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     Gross Musculoskeletal Assessment Tremor: None; Bulk: Normal, no muscle wasting noted; Tone: Normal; No erythema, edema, or ecchymosis noted;  GAIT: Distance walked: 34m Assistive device utilized: None Level of assistance: Complete Independence Comments: Slowed velocity, decreased hip ext in LLE  Posture:   AROM AROM (Normal range in degrees) AROM   Lumbar   Flexion (65)   Extension (30)   Right lateral flexion (25)   Left lateral flexion (25)   Right rotation (30)   Left rotation (30)       Hip Right Left  Flexion (125) WNL WNL  Extension (15)    Abduction (40)    Adduction (30)    Internal Rotation (45) WNL WNL  External Rotation (45) WNL WNL      Knee    Flexion (135) WNL WNL  Extension (0) WNL WNL      Ankle    Dorsiflexion (20)    Plantarflexion (50)    Inversion (35)    Eversion (15)    (* = pain; Blank rows = not tested)  LE MMT: MMT (out of 5) Right  Left   Hip flexion 4 3+  Hip extension    Hip abduction  4-  Hip adduction    Hip internal rotation 5 5  Hip external rotation 5 4  Knee flexion 4 4  Knee extension 4 4  Ankle dorsiflexion 5 5  Ankle plantarflexion 5 5  Ankle inversion    Ankle eversion    (* = pain; Blank rows = not tested)  Sensation Grossly intact to light touch throughout bilateral LEs as determined by testing dermatomes L2-S2. Proprioception, stereognosis, and hot/cold testing deferred on this date.  Reflexes R/L Knee Jerk (L3/4): 2+/2+  Ankle Jerk (S1/2): 2+/2+   Muscle Length Hamstrings: R: Negative L: Negative Thomas (hip flexors): R: Negative  L: Positive for Rectus Femoris tightness   Palpation Location Right Left         Lumbar paraspinals     Quadratus Lumborum    Iliac Crest    ASIS  1  Proximal Rectus Femoris  1      Vastus Lateralis   1  Vastus Medius   1  IT Band   1  Greater Trochanter  1  (Blank rows = not tested) Graded on 0-4 scale (0 = no pain, 1 = pain, 2 = pain with wincing/grimacing/flinching, 3 = pain with withdrawal, 4 = unwilling to allow palpation)  Passive Accessory Intervertebral Motion Deferred   Special Tests  Hip: FABER (SN 81): R: Negative L: Negative FADIR (SN 94): R: Negative L: Negative Hip scour (SN 50): R: Not examined L: Not examined  SIJ:  Thigh Thrust (SN 88, -LR 0.18) : R: Not examined L: Not examined  Piriformis Syndrome: FAIR Test (SN 88, SP 83): R: Not examined L: Not examined  Physical Performance Measures  : 13.71s = .72 m/s 30s STS: 14.5 reps Deep squat: Narrow BOS, minor bilateral knee valgus noted  TODAY'S TREATMENT:  Therapeutic Exercise:   PT educated and reviewed initial HEP:  - Hip Flexor Stretch at [place] of Bed (Mirrored)  - 1 x daily - 7 x weekly - 3 sets - 30s hold - Standing Marching  - 1 x daily - 3-4 x weekly - 2-3 sets - 10-12 reps - 5 hold - Sit to Stand  - 1 x daily - 3-4 x weekly -  2-3 sets - 10 reps  PATIENT EDUCATION:  Education details: HEP, POC, Prognosis  Person educated: Patient Education method: Explanation, Demonstration, and Handouts Education comprehension: verbalized understanding and returned demonstration   HOME EXERCISE PROGRAM:  Access Code: [email or web address] URL: [email or web address] Date: 02/29/2024 Prepared by: Veryl Gottron Kiaya Haliburton  Exercises - Hip Flexor Stretch at Edge of Bed (Mirrored)  - 1 x daily - 7 x weekly - 3 sets - 30s hold - Standing Marching  - 1 x daily - 3-4 x weekly - 2-3 sets - 10-12 reps - 5 hold - Sit to Stand  - 1 x daily - 3-4 x weekly - 2-3 sets - 10 reps   ASSESSMENT:  CLINICAL IMPRESSION: Patient is a 83 y.o. female who was seen today for physical therapy evaluation and treatment for left  thigh pain. AROM grossly intact but concordant pain with L hip flexion. TTP along proximal rectus femoris tendon, vastus lateralis/medialis musculature, proximal IT and distal quadriceps tendon. MMT significant for weakness in L hip flexion with concordant pain; hip abduction and knee ext/flexion. Pt demonstrates good muscle endurance with 30s STS (age related norms - 12) as indicated with 13.5 reps. Pain has limited her walking speed; today she demonstrated .72 m/s on classifying her a limited community Ambulator. PT suspects signs and symptoms consistent with L quadriceps strain. PT provided initial HEP focused on strengthening quadriceps group. Her deficits currently limit her from full participation in community ambulation, stair navigation , prolonged standing. Based on today's performance, pt will continue to benefit from skilled PT in order to facilitate return to PLOF and improve QoL.   OBJECTIVE IMPAIRMENTS: decreased activity tolerance, difficulty walking, decreased strength, and pain.   ACTIVITY LIMITATIONS: squatting and stairs  PARTICIPATION LIMITATIONS: community activity and church  PERSONAL FACTORS: Age, Past/current experiences, Time since onset of injury/illness/exacerbation, and 1-2 comorbidities: Hx of THA (2024), Type II DM are also affecting patient's functional outcome.   REHAB POTENTIAL: Good  CLINICAL DECISION MAKING: Stable/uncomplicated  EVALUATION COMPLEXITY: Low   GOALS: Goals reviewed with patient? No  SHORT TERM GOALS: Target date: 04/11/2024  Pt will be independent with HEP in order to improve strength and decrease hip pain to improve pain-free function at home and work. Baseline: Initial HEP provided. Goal status: INITIAL   LONG TERM GOALS: Target date: 05/23/2024  Pt will decrease 30s STS by at least 5 seconds in order to demonstrate clinically significant improvement in LE strength   Baseline: 02/29/2024: 14.5 reps  Goal status: INITIAL   2.  Pt  will decrease worst hip pain by at least 3 points on the NPRS in order to demonstrate clinically significant reduction in hip pain. Baseline: 02/29/2024: 4/10 Goal status: INITIAL  3.  Pt will increase by at least 0.13 m/s in order to demonstrate clinically significant improvement in community ambulation.        Baseline: 02/29/2024: .72 m/s Goal status: INITIAL  4.  Pt will increase LEFS by at least 9 points in order to demonstrate significant improvement in lower extremity function.      Baseline: 02/29/2024: 58 / 80 = 72.5 % Goal status: INITIAL  5.  Patient will be able to safely navigate a flight of 10 stairs, using proper foot placement and handrail support, without requiring assistance from PT in order to demonstrate significant improvement in LE strength and safety. Baseline: 02/29/2024: deferred  Goal status: INITIAL   PLAN: PT FREQUENCY: 1-2x/week  PT DURATION: 12 weeks  PLANNED INTERVENTIONS: Therapeutic exercises, Therapeutic  activity, Neuromuscular re-education, Balance training, Gait training, Patient/Family education, Self Care, Joint mobilization, Joint manipulation, Vestibular training, Canalith repositioning, Orthotic/Fit training, DME instructions, Dry Needling, Electrical stimulation, Spinal manipulation, Spinal mobilization, Cryotherapy, Moist heat, Taping, Traction, Ultrasound, Ionotophoresis 4mg /ml Dexamethasone, Manual therapy, and Re-evaluation.  PLAN FOR NEXT SESSION: Initiate functional strengthening, Hip flexor stretching, stair navigation,    Satira Curet PT, DPT Physical Therapist- [place]  [facility]  02/29/2024, 1:03 PM

## 2024-02-29 ENCOUNTER — Ambulatory Visit: Attending: Orthopaedic Surgery

## 2024-02-29 DIAGNOSIS — M6281 Muscle weakness (generalized): Secondary | ICD-10-CM | POA: Insufficient documentation

## 2024-02-29 DIAGNOSIS — M79652 Pain in left thigh: Secondary | ICD-10-CM | POA: Insufficient documentation

## 2024-02-29 DIAGNOSIS — R2689 Other abnormalities of gait and mobility: Secondary | ICD-10-CM | POA: Diagnosis not present

## 2024-02-29 DIAGNOSIS — M79605 Pain in left leg: Secondary | ICD-10-CM | POA: Insufficient documentation

## 2024-02-29 NOTE — Assessment & Plan Note (Signed)
 Developed constipation  and nausea after 2 day trial of rosuvastatin 

## 2024-02-29 NOTE — Telephone Encounter (Signed)
 Spoke with pt to advise her that if she is still having constipation and abdominal discomfort she needs to be seen. Pt stated that she would like to wait a little longer since she is not taking the medication anymore to see if the symptoms clear up. I asked the pt how long she had been taking the medication, pt stated that she only took one pill and then that evening she started having the abdominal pain and feeling of being constipated.

## 2024-03-02 ENCOUNTER — Encounter: Payer: Self-pay | Admitting: Hematology & Oncology

## 2024-03-02 ENCOUNTER — Inpatient Hospital Stay: Payer: PPO | Attending: Hematology & Oncology

## 2024-03-02 ENCOUNTER — Inpatient Hospital Stay (HOSPITAL_BASED_OUTPATIENT_CLINIC_OR_DEPARTMENT_OTHER): Payer: PPO | Admitting: Hematology & Oncology

## 2024-03-02 VITALS — BP 139/84 | HR 96 | Temp 98.5°F | Resp 20 | Ht 65.0 in | Wt 165.8 lb

## 2024-03-02 DIAGNOSIS — E1169 Type 2 diabetes mellitus with other specified complication: Secondary | ICD-10-CM

## 2024-03-02 DIAGNOSIS — E785 Hyperlipidemia, unspecified: Secondary | ICD-10-CM

## 2024-03-02 DIAGNOSIS — C50919 Malignant neoplasm of unspecified site of unspecified female breast: Secondary | ICD-10-CM | POA: Diagnosis not present

## 2024-03-02 DIAGNOSIS — Z170 Estrogen receptor positive status [ER+]: Secondary | ICD-10-CM

## 2024-03-02 DIAGNOSIS — E119 Type 2 diabetes mellitus without complications: Secondary | ICD-10-CM | POA: Diagnosis not present

## 2024-03-02 DIAGNOSIS — Z853 Personal history of malignant neoplasm of breast: Secondary | ICD-10-CM | POA: Diagnosis not present

## 2024-03-02 DIAGNOSIS — Z08 Encounter for follow-up examination after completed treatment for malignant neoplasm: Secondary | ICD-10-CM | POA: Insufficient documentation

## 2024-03-02 DIAGNOSIS — E039 Hypothyroidism, unspecified: Secondary | ICD-10-CM | POA: Diagnosis not present

## 2024-03-02 DIAGNOSIS — D0501 Lobular carcinoma in situ of right breast: Secondary | ICD-10-CM

## 2024-03-02 LAB — CBC WITH DIFFERENTIAL (CANCER CENTER ONLY)
Abs Immature Granulocytes: 0.09 10*3/uL — ABNORMAL HIGH (ref 0.00–0.07)
Basophils Absolute: 0.1 10*3/uL (ref 0.0–0.1)
Basophils Relative: 1 %
Eosinophils Absolute: 0.3 10*3/uL (ref 0.0–0.5)
Eosinophils Relative: 4 %
HCT: 44.7 % (ref 36.0–46.0)
Hemoglobin: 15.5 g/dL — ABNORMAL HIGH (ref 12.0–15.0)
Immature Granulocytes: 1 %
Lymphocytes Relative: 24 %
Lymphs Abs: 1.7 10*3/uL (ref 0.7–4.0)
MCH: 30.6 pg (ref 26.0–34.0)
MCHC: 34.7 g/dL (ref 30.0–36.0)
MCV: 88.3 fL (ref 80.0–100.0)
Monocytes Absolute: 0.4 10*3/uL (ref 0.1–1.0)
Monocytes Relative: 6 %
Neutro Abs: 4.6 10*3/uL (ref 1.7–7.7)
Neutrophils Relative %: 64 %
Platelet Count: 170 10*3/uL (ref 150–400)
RBC: 5.06 MIL/uL (ref 3.87–5.11)
RDW: 13.2 % (ref 11.5–15.5)
WBC Count: 7.3 10*3/uL (ref 4.0–10.5)
nRBC: 0 % (ref 0.0–0.2)

## 2024-03-02 LAB — LACTATE DEHYDROGENASE: LDH: 202 U/L — ABNORMAL HIGH (ref 98–192)

## 2024-03-02 LAB — CMP (CANCER CENTER ONLY)
ALT: 48 U/L — ABNORMAL HIGH (ref 0–44)
AST: 32 U/L (ref 15–41)
Albumin: 4.7 g/dL (ref 3.5–5.0)
Alkaline Phosphatase: 63 U/L (ref 38–126)
Anion gap: 10 (ref 5–15)
BUN: 26 mg/dL — ABNORMAL HIGH (ref 8–23)
CO2: 24 mmol/L (ref 22–32)
Calcium: 10.3 mg/dL (ref 8.9–10.3)
Chloride: 104 mmol/L (ref 98–111)
Creatinine: 1.19 mg/dL — ABNORMAL HIGH (ref 0.44–1.00)
GFR, Estimated: 46 mL/min — ABNORMAL LOW (ref >60)
Glucose, Bld: 179 mg/dL — ABNORMAL HIGH (ref 70–99)
Potassium: 4.1 mmol/L (ref 3.5–5.1)
Sodium: 138 mmol/L (ref 135–145)
Total Bilirubin: 1 mg/dL (ref 0.0–1.2)
Total Protein: 7.6 g/dL (ref 6.5–8.1)

## 2024-03-02 NOTE — Progress Notes (Signed)
 Hematology and Oncology Follow Up Visit  Jacqueline Mata [id number] [birth year] 83 y.o. 03/02/2024   Principle Diagnosis:  Synchronous bilateral stage IIA (T2 N0 M0) ductal carcinoma of bilateral breast - ER+/HER2-.  Current Therapy:   Observation     Interim History:  Ms.  Jacqueline Mata is comes in for followup.  We see her every 6 months.  She did have an abdominal ultrasound recently.  This supposedly was for a fatty liver.  I really not surprised that she does have diabetes.  Otherwise, she seems to be doing fairly well.  She really has had no other complaints.  She is trying to watch what she eats.  She has not been able to exercise recently.  She hurt her left thigh.  She is doing some physical therapy right now.  Hopefully, in the month, she will be back in the gym.  She has had no issues with nausea or vomiting.  She has had no problems with bowels or bladder.  She has had no bleeding.  She has had no leg swelling.  She has had no cough or shortness of breath.  Since we last saw her, she has avoided COVID and Influenza.  Overall, I would say that her performance status is probably ECOG 1.    Medications:  Current Outpatient Medications:    ascorbic acid (VITAMIN C) 500 MG tablet, Take 500 mg by mouth daily., Disp: , Rfl:    blood glucose meter kit and supplies, Dispense based on patient and insurance preference. Use to check blood sugars one time daily. (ICD-10 E11.9)., Disp: 1 each, Rfl: 0   Cholecalciferol (VITAMIN D ) 50 MCG (2000 UT) tablet, Take 2,000 Units by mouth daily., Disp: , Rfl:    COLLAGEN PO, Take 1 Scoop by mouth every other day. With Probiotic, Disp: , Rfl:    ezetimibe  (ZETIA ) 10 MG tablet, Take 1 tablet (10 mg total) by mouth daily., Disp: 90 tablet, Rfl: 1   fexofenadine  (ALLEGRA  ALLERGY) 60 MG tablet, Take 1 tablet (60 mg total) by mouth 2 (two) times daily. (Patient taking differently: Take 60 mg by mouth daily.), Disp: 180 tablet, Rfl: 1   folic acid  (FOLVITE )  800 MCG tablet, Take 800 mcg by mouth daily., Disp: , Rfl:    Lancets (ONETOUCH DELICA PLUS LANCET33G) MISC, USE WITH STRIPS, Disp: 100 each, Rfl: 3   levothyroxine  (SYNTHROID ) 88 MCG tablet, TAKE ONE TABLET EVERY DAY BEFORE BREAKFAST, Disp: 90 tablet, Rfl: 1   MAGNESIUM GLUCONATE PO, Take 1 capsule by mouth daily., Disp: , Rfl:    ONETOUCH VERIO test strip, USE TO CHECK BLOOD SUGARS TWICE DAILY, Disp: 100 each, Rfl: 12   OVER THE COUNTER MEDICATION, Bone Broth powder - alternates with collagen., Disp: , Rfl:    Propylene Glycol (SYSTANE BALANCE) 0.6 % SOLN, Place 1 drop into both eyes daily as needed (dry eyes)., Disp: , Rfl:    Turmeric (QC TUMERIC COMPLEX) 500 MG CAPS, Take by mouth., Disp: , Rfl:    vitamin B-12 (CYANOCOBALAMIN ) 1000 MCG tablet, Take 1,000 mcg by mouth daily., Disp: , Rfl:    amoxicillin  (AMOXIL ) 500 MG capsule, Take 2,000 mg by mouth See admin instructions. Dental procedures. Takes 2000mg  one hour prior to dental procedures. (Patient not taking: Reported on 03/02/2024), Disp: , Rfl:    metroNIDAZOLE (METROCREAM) 0.75 % cream, Apply 1 Application topically 2 (two) times daily. (Patient not taking: Reported on 03/02/2024), Disp: , Rfl:   Allergies:  Allergies  Allergen Reactions  Codeine Shortness Of Breath and Other (See Comments)    Altered mental staus   Ilevro [Nepafenac] Other (See Comments)    Eye swelling Eye drops    Other Swelling    DOGS.  Congestion. FEATHERS.  Congestion  Can not take preservative free eye drops     Maxitrol [Neomycin-Polymyxin-Dexameth]     dry eyes    Metformin  And Related     Shakiness    Rosuvastatin  Nausea Only    And constipation    Augmentin  [Amoxicillin -Pot Clavulanate] Itching and Rash   Molds & Smuts Other (See Comments)    Congestion.   Neomycin Rash   Neomycin-Bacitracin Zn-Polymyx Rash   Neomycin-Polymyxin-Hc Rash   Tape Dermatitis    Blisters   Tapentadol Nausea And Vomiting    Nucynta   Terbinafine  Rash    Trouble  swallowing   Tramadol Other (See Comments), Nausea Only and Nausea And Vomiting    dizziness    Past Medical History, Surgical history, Social history, and Family History were reviewed and updated.  Review of Systems: Review of Systems  Constitutional: Negative.   HENT: Negative.    Eyes: Negative.   Respiratory: Negative.    Cardiovascular: Negative.   Gastrointestinal: Negative.   Genitourinary: Negative.   Musculoskeletal: Negative.   Skin: Negative.   Neurological: Negative.   Endo/Heme/Allergies: Negative.   Psychiatric/Behavioral: Negative.       Physical Exam:  height is 5\' 5"  (1.651 m) and weight is 165 lb 12.8 oz (75.2 kg). Her oral temperature is 98.5 F (36.9 C). Her blood pressure is 139/84 and her pulse is 96. Her respiration is 20 and oxygen saturation is 98%.   Physical Exam Vitals reviewed.  HENT:     Head: Normocephalic and atraumatic.  Eyes:     Pupils: Pupils are equal, round, and reactive to light.  Cardiovascular:     Rate and Rhythm: Normal rate and regular rhythm.     Heart sounds: Normal heart sounds.  Pulmonary:     Effort: Pulmonary effort is normal.     Breath sounds: Normal breath sounds.  Abdominal:     General: Bowel sounds are normal.     Palpations: Abdomen is soft.  Musculoskeletal:        General: No tenderness or deformity. Normal range of motion.     Cervical back: Normal range of motion.  Lymphadenopathy:     Cervical: No cervical adenopathy.  Skin:    General: Skin is warm and dry.     Findings: No erythema or rash.  Neurological:     Mental Status: She is alert and oriented to person, place, and time.  Psychiatric:        Behavior: Behavior normal.        Thought Content: Thought content normal.        Judgment: Judgment normal.    Lab Results  Component Value Date   WBC 7.3 03/02/2024   HGB 15.5 (H) 03/02/2024   HCT 44.7 03/02/2024   MCV 88.3 03/02/2024   PLT 170 03/02/2024     Chemistry      Component Value  Date/Time   NA 138 03/02/2024 0950   NA 141 02/11/2024 1442   NA 135 04/29/2017 1157   NA 138 10/29/2016 1021   K 4.1 03/02/2024 0950   K 3.7 04/29/2017 1157   K 4.2 10/29/2016 1021   CL 104 03/02/2024 0950   CL 102 04/29/2017 1157   CO2 24 03/02/2024 0950  CO2 27 04/29/2017 1157   CO2 24 10/29/2016 1021   BUN 26 (H) 03/02/2024 0950   BUN 27 02/11/2024 1442   BUN 9 04/29/2017 1157   BUN 17.3 10/29/2016 1021   CREATININE 1.19 (H) 03/02/2024 0950   CREATININE 0.9 04/29/2017 1157   CREATININE 0.9 10/29/2016 1021      Component Value Date/Time   CALCIUM  10.3 03/02/2024 0950   CALCIUM  9.2 04/29/2017 1157   CALCIUM  10.0 10/29/2016 1021   ALKPHOS 63 03/02/2024 0950   ALKPHOS 64 04/29/2017 1157   ALKPHOS 71 10/29/2016 1021   AST 32 03/02/2024 0950   AST 24 10/29/2016 1021   ALT 48 (H) 03/02/2024 0950   ALT 37 04/29/2017 1157   ALT 26 10/29/2016 1021   BILITOT 1.0 03/02/2024 0950   BILITOT 1.18 10/29/2016 1021       Impression and Plan: Ms. Bergsma is 83 year old white female with history of synchronous bilateral breast cancer. Fortunately both breast cancers were node negative. They were ER positive and HER-2 negative.   It has now been almost 20 years since she was treated for the breast cancer.  I really have to believe that she is going to be cured since her cancers were node negative.  I did see that her biggest problem will be the diabetes.  I am sure that her family doctor is going to work on this.  We will plan to get her back in 6 months.     Ivor Mars, MD 6/5/202510:35 AM

## 2024-03-03 DIAGNOSIS — M542 Cervicalgia: Secondary | ICD-10-CM | POA: Diagnosis not present

## 2024-03-03 DIAGNOSIS — M62830 Muscle spasm of back: Secondary | ICD-10-CM | POA: Diagnosis not present

## 2024-03-03 DIAGNOSIS — M9903 Segmental and somatic dysfunction of lumbar region: Secondary | ICD-10-CM | POA: Diagnosis not present

## 2024-03-03 DIAGNOSIS — M546 Pain in thoracic spine: Secondary | ICD-10-CM | POA: Diagnosis not present

## 2024-03-06 ENCOUNTER — Ambulatory Visit

## 2024-03-06 DIAGNOSIS — R2689 Other abnormalities of gait and mobility: Secondary | ICD-10-CM

## 2024-03-06 DIAGNOSIS — M6281 Muscle weakness (generalized): Secondary | ICD-10-CM

## 2024-03-06 DIAGNOSIS — M79605 Pain in left leg: Secondary | ICD-10-CM

## 2024-03-06 NOTE — Therapy (Signed)
 OUTPATIENT PHYSICAL THERAPY HIP EVALUATION   Patient Name: Jacqueline Mata MRN: [id number] DOB:[birth year], 83 y.o., female Today's Date: 03/06/2024  END OF SESSION:  PT End of Session - 03/06/24 1034     Visit Number 2    Number of Visits 25    Date for PT Re-Evaluation 05/23/24    PT Start Time 1032    PT Stop Time 1115    PT Time Calculation (min) 43 min    Activity Tolerance Patient tolerated treatment well;No increased pain    Behavior During Therapy [facility] for tasks assessed/performed             Past Medical History:  Diagnosis Date   Arthritis    breast cancer 2005   bilateral   Cataract 01/04/13 and 03/01/13   Complication of anesthesia    dizziness    Hyperlipidemia    hypothyroidism    Hypothyroidism    Personal history of chemotherapy    Personal history of radiation therapy    PONV (postoperative nausea and vomiting)    Pre-diabetes    Sleep apnea    mild diagnosis no cpap   Tear of left biceps muscle 01/08/2021   Tinnitus    Past Surgical History:  Procedure Laterality Date   bilateral knee replacement s     BREAST BIOPSY Left 2005   positive   BREAST BIOPSY Right 2005   positive   BREAST LUMPECTOMY Left 2005   BREAST LUMPECTOMY Right 2005   gumm surgery      JOINT REPLACEMENT  2013   by Dr. Aubry Blase   righ tknee arthroscopy      TONSILLECTOMY     TOTAL HIP ARTHROPLASTY Right 11/12/2020   Procedure: RIGHT TOTAL HIP ARTHROPLASTY ANTERIOR APPROACH;  Surgeon: Dayne Even, MD;  Location: [facility];  Service: Orthopedics;  Laterality: Right;   TOTAL HIP ARTHROPLASTY Left 11/25/2021   Procedure: LEFT TOTAL HIP ARTHROPLASTY ANTERIOR APPROACH;  Surgeon: Dayne Even, MD;  Location: [facility];  Service: Orthopedics;  Laterality: Left;   Patient Active Problem List   Diagnosis Date Noted   Pain of left thumb 02/13/2024   Type 2 diabetes mellitus with diabetic chronic kidney disease (HCC) 02/13/2024   Nocturnal leg cramps 11/06/2023   Seasonal allergies  06/20/2023   Sensorineural hearing loss (SNHL) of both ears 10/21/2022   Primary localized osteoarthritis of left hip 11/25/2021   Rosacea 11/10/2021   Chronic hip pain, left 11/10/2021   Chronic pain in left shoulder 05/06/2021   CKD stage 3a, GFR 45-59 ml/min (HCC) 05/06/2021   Mixed stress and urge urinary incontinence 05/06/2021   Pain, dental 02/10/2021   Preoperative evaluation to rule out surgical contraindication 11/04/2020   Primary osteoarthritis of right hip 07/20/2020   Pre-ulcerative corn or callous 07/09/2020   Elevated liver enzymes 04/16/2020   Short-term memory loss 07/31/2019   Educated about COVID-19 virus infection 01/30/2019   Osteopenia determined by x-ray 03/02/2018   Myalgia due to statin 01/02/2018   Stage 2 carcinoma of breast, ER+, unspecified laterality (HCC) 04/29/2017   Onychomycosis of great toe 03/19/2016   Cervical radiculopathy due to degenerative joint disease of spine 03/19/2015   Acromioclavicular joint arthritis 02/19/2015   Rotator cuff dysfunction 01/08/2015   Shoulder pain, bilateral 12/21/2014   Sciatica of left side 10/17/2014   Encounter for Medicare annual wellness exam 05/22/2014   Basal cell carcinoma of leg 12/20/2013   Overweight (BMI 25.0-29.9) 08/08/2013   Statin intolerance 08/07/2013   Balance problem due  to vestibular dysfunction 05/01/2012   Status post total knee replacement 05/01/2012   History of breast cancer 11/09/2011   Acquired hypothyroidism 07/26/2011   Hyperlipidemia associated with type 2 diabetes mellitus (HCC) 07/23/2011    PCP: Thersia Flax, MD  REFERRING PROVIDER: Dayne Even, MD  REFERRING DIAG: [phone] (ICD-10-CM) - Injury of left quadriceps femoris muscle   RATIONALE FOR EVALUATION AND TREATMENT: Rehabilitation  THERAPY DIAG: Pain in left leg  Other abnormalities of gait and mobility  Muscle weakness (generalized)  ONSET DATE: December 22 2023  FOLLOW-UP APPT SCHEDULED WITH REFERRING  PROVIDER: Didn't address   SUBJECTIVE:                                                                                                                                                                                         SUBJECTIVE STATEMENT:  Patient with chief concern of L thigh pain  PERTINENT HISTORY:   Patient is a 83 y.o. female reporting to OPPT with L thigh pain. Patient reports on palm Sunday during a choir performance she was in a static partial forward lunge with a torso rotation (R leg in front). She reports holding that position for a prolonged time and turned toward the choir director and experienced a sharp pain. Following that day she saw her surgeon regarding hip concerns; pt reports that physician cleared her any hip diagnosis or pain stemming from gluteal musculature. Additionally she spoke with her chiropractor; only findings discussed was leg length discrepancy (R shorter than L). She describes her pain as sharp and aching with specific movements and it remains constant. Aggravating factors involve prolonged walking and standing. Pain is alleviated with rest or having the L leg extended and supported on a stool. Patient reports that she has some difficulty with lifting L leg when turning to another direction. Currently she denies numbness, tingling, changes in b/b.   PAIN:    Pain Intensity: Present: 3/10, Best: 2/10, Worst: 4/10 Pain location: L Proximal thigh  Pain Quality: constant, sharp, and aching  Radiating: No  Numbness/Tingling: No Focal Weakness:  Relieving factors: Resting  How long can you sit:  How long can you stand: < 30 min  History of prior back or hip injury, pain, surgery, or therapy: Yes Red flags: Negative for bowel/bladder changes.  PRECAUTIONS: Fall  WEIGHT BEARING RESTRICTIONS: No  FALLS: Has patient fallen in last 6 months? No  Living Environment Lives with: lives alone Lives in: House/apartment Stairs: Yes: Internal: 13 steps; can  reach both and External: 3 steps; can reach both Has following equipment at home: None  Prior level of function: Independent  Occupational demands: Retired  Hobbies: Quilting; Choir Set designer)  Patient Goals: "Reduce pain"     OBJECTIVE:  Cognition Cognition WNL.     Gross Musculoskeletal Assessment Tremor: None; Bulk: Normal, no muscle wasting noted; Tone: Normal; No erythema, edema, or ecchymosis noted;  GAIT: Distance walked: 68m Assistive device utilized: None Level of assistance: Complete Independence Comments: Slowed velocity, decreased hip ext in LLE  Posture:   AROM AROM (Normal range in degrees) AROM   Lumbar   Flexion (65)   Extension (30)   Right lateral flexion (25)   Left lateral flexion (25)   Right rotation (30)   Left rotation (30)       Hip Right Left  Flexion (125) WNL WNL  Extension (15)    Abduction (40)    Adduction (30)    Internal Rotation (45) WNL WNL  External Rotation (45) WNL WNL      Knee    Flexion (135) WNL WNL  Extension (0) WNL WNL      Ankle    Dorsiflexion (20)    Plantarflexion (50)    Inversion (35)    Eversion (15)    (* = pain; Blank rows = not tested)  LE MMT: MMT (out of 5) Right  Left   Hip flexion 4 3+  Hip extension    Hip abduction  4-  Hip adduction    Hip internal rotation 5 5  Hip external rotation 5 4  Knee flexion 4 4  Knee extension 4 4  Ankle dorsiflexion 5 5  Ankle plantarflexion 5 5  Ankle inversion    Ankle eversion    (* = pain; Blank rows = not tested)  Sensation Grossly intact to light touch throughout bilateral LEs as determined by testing dermatomes L2-S2. Proprioception, stereognosis, and hot/cold testing deferred on this date.  Reflexes R/L Knee Jerk (L3/4): 2+/2+  Ankle Jerk (S1/2): 2+/2+   Muscle Length Hamstrings: R: Negative L: Negative Thomas (hip flexors): R: Negative  L: Positive for Rectus Femoris tightness   Palpation Location Right Left         Lumbar  paraspinals    Quadratus Lumborum    Iliac Crest    ASIS  1  Proximal Rectus Femoris  1      Vastus Lateralis   1  Vastus Medius   1  IT Band   1  Greater Trochanter  1  (Blank rows = not tested) Graded on 0-4 scale (0 = no pain, 1 = pain, 2 = pain with wincing/grimacing/flinching, 3 = pain with withdrawal, 4 = unwilling to allow palpation)  Passive Accessory Intervertebral Motion Deferred   Special Tests  Hip: FABER (SN 81): R: Negative L: Negative FADIR (SN 94): R: Negative L: Negative Hip scour (SN 50): R: Not examined L: Not examined  SIJ:  Thigh Thrust (SN 88, -LR 0.18) : R: Not examined L: Not examined  Piriformis Syndrome: FAIR Test (SN 88, SP 83): R: Not examined L: Not examined  Physical Performance Measures  : 13.71s = .72 m/s 30s STS: 14.5 reps Deep squat: Narrow BOS, minor bilateral knee valgus noted  TODAY'S TREATMENT: DATE: 03/06/2024  Therapeutic Exercise:   NuStep L5-1 x 5 min x UE/LE (Seat 8) for LE warm up, endurance and strength; PT manually adjusted resistance throughout bout.    Seated Alternating Hip Flexion    3 x 20 Yellow TB around feet     Supine L SLR   3 x 10     Supine Bridge  1 x 10    1 x 10 on heels    1 x 10, LE extended on heels    Standing Lunge Hip Flexor Stretch for LLE (Next to mat table)   30s/bout x 3 in order to improve pain and tissue extensibility  Time spent reviewing additional exercises added with HEP. Pt return demo of donning yellow TB around feet for seated Hip flexion.   Therapeutic Activity:   Standing Hip Flexion Marches against resistance    3 x 10 ea leg, 3# ankle weights, BUE support     Standing Hip Abduction against resistance   2 x 10, green TB around ankles, BUE support    PATIENT EDUCATION:  Education details: HEP, POC, Prognosis  Person educated: Patient Education method: Explanation, Demonstration, and Handouts Education comprehension: verbalized understanding and returned  demonstration   HOME EXERCISE PROGRAM:  Access Code: [email or web address] URL: [email or web address] Date: 03/06/2024 Prepared by: Veryl Gottron [place]  Exercises - Hip Flexor Stretch at Edge of Bed (Mirrored)  - 1 x daily - 7 x weekly - 3 sets - 30s hold - Standing Marching  - 1 x daily - 3-4 x weekly - 2-3 sets - 10-12 reps - 5 hold - Sit to Stand  - 1 x daily - 3-4 x weekly - 2-3 sets - 10 reps - Supine Active Straight Leg Raise  - 1 x daily - 3-4 x weekly - 2-3 sets - 10 reps - Seated March  - 1 x daily - 3-4 x weekly - 2-3 sets - 10 reps  Access Code: [email or web address] URL: [email or web address] Date: 02/29/2024 Prepared by: Veryl Gottron [place]ida [place]  Exercises - Hip Flexor Stretch at Edge of Bed (Mirrored)  - 1 x daily - 7 x weekly - 3 sets - 30s hold - Standing Marching  - 1 x daily - 3-4 x weekly - 2-3 sets - 10-12 reps - 5 hold - Sit to Stand  - 1 x daily - 3-4 x weekly - 2-3 sets - 10 reps   ASSESSMENT:  CLINICAL IMPRESSION: Patient returns to OPPT for first follow up in management of L thigh pain. Reviewed HEP with good carryover from patient. She has endorsed minor improvements in pain following HEP interventions. Pt tolerated all exercises focused on hip strengthening. Pt demonstrated safety with standing hip flexor stretch with stool support. She still has pain in the L thigh limiting her full participation in recreational activities, driving and prolonged sitting. Based on today's performance, pt will continue to benefit from skilled PT in order to facilitate return to PLOF and improve QoL.   OBJECTIVE IMPAIRMENTS: decreased activity tolerance, difficulty walking, decreased strength, and pain.   ACTIVITY LIMITATIONS: squatting and stairs  PARTICIPATION LIMITATIONS: community activity and church  PERSONAL FACTORS: Age, Past/current experiences, Time since onset of injury/illness/exacerbation, and 1-2 comorbidities: Hx of THA (2024), Type II DM are also affecting patient's  functional outcome.   REHAB POTENTIAL: Good  CLINICAL DECISION MAKING: Stable/uncomplicated  EVALUATION COMPLEXITY: Low   GOALS: Goals reviewed with patient? No  SHORT TERM GOALS: Target date: 04/17/2024  Pt will be independent with HEP in order to improve strength and decrease hip pain to improve pain-free function at home and work. Baseline: Initial HEP provided. Goal status: INITIAL   LONG TERM GOALS: Target date: 05/29/2024  Pt will decrease 30s STS by at least 5 seconds in order to demonstrate clinically significant improvement in LE strength   Baseline: 02/29/2024: 14.5 reps  Goal status: INITIAL   2.  Pt  will decrease worst hip pain by at least 3 points on the NPRS in order to demonstrate clinically significant reduction in hip pain. Baseline: 02/29/2024: 4/10 Goal status: INITIAL  3.  Pt will increase by at least 0.13 m/s in order to demonstrate clinically significant improvement in community ambulation.        Baseline: 02/29/2024: .72 m/s Goal status: INITIAL  4.  Pt will increase LEFS by at least 9 points in order to demonstrate significant improvement in lower extremity function.      Baseline: 02/29/2024: 58 / 80 = 72.5 % Goal status: INITIAL  5.  Patient will be able to safely navigate a flight of 10 stairs, using proper foot placement and handrail support, without requiring assistance from PT in order to demonstrate significant improvement in LE strength and safety. Baseline: 02/29/2024: deferred  Goal status: Deferred   PLAN: PT FREQUENCY: 1-2x/week  PT DURATION: 12 weeks  PLANNED INTERVENTIONS: Therapeutic exercises, Therapeutic activity, Neuromuscular re-education, Balance training, Gait training, Patient/Family education, Self Care, Joint mobilization, Joint manipulation, Vestibular training, Canalith repositioning, Orthotic/Fit training, DME instructions, Dry Needling, Electrical stimulation, Spinal manipulation, Spinal mobilization, Cryotherapy,  Moist heat, Taping, Traction, Ultrasound, Ionotophoresis 4mg /ml Dexamethasone, Manual therapy, and Re-evaluation.  PLAN FOR NEXT SESSION: Initiate functional strengthening, Hip flexor stretching, stair navigation,    Satira Curet PT, DPT Physical Therapist- [place]  [facility]  03/06/2024, 10:36 AM

## 2024-03-07 ENCOUNTER — Ambulatory Visit

## 2024-03-08 ENCOUNTER — Encounter

## 2024-03-08 DIAGNOSIS — Z08 Encounter for follow-up examination after completed treatment for malignant neoplasm: Secondary | ICD-10-CM | POA: Diagnosis not present

## 2024-03-08 DIAGNOSIS — D692 Other nonthrombocytopenic purpura: Secondary | ICD-10-CM | POA: Diagnosis not present

## 2024-03-08 DIAGNOSIS — L821 Other seborrheic keratosis: Secondary | ICD-10-CM | POA: Diagnosis not present

## 2024-03-08 DIAGNOSIS — Z85828 Personal history of other malignant neoplasm of skin: Secondary | ICD-10-CM | POA: Diagnosis not present

## 2024-03-10 DIAGNOSIS — Z96653 Presence of artificial knee joint, bilateral: Secondary | ICD-10-CM | POA: Diagnosis not present

## 2024-03-14 ENCOUNTER — Ambulatory Visit

## 2024-03-14 DIAGNOSIS — M79605 Pain in left leg: Secondary | ICD-10-CM | POA: Diagnosis not present

## 2024-03-14 DIAGNOSIS — M6281 Muscle weakness (generalized): Secondary | ICD-10-CM

## 2024-03-14 DIAGNOSIS — M79652 Pain in left thigh: Secondary | ICD-10-CM

## 2024-03-14 DIAGNOSIS — R2689 Other abnormalities of gait and mobility: Secondary | ICD-10-CM

## 2024-03-14 NOTE — Therapy (Signed)
 OUTPATIENT PHYSICAL THERAPY HIP TREATMENT   Patient Name: Jacqueline Mata MRN: [id number] DOB:[birth year], 83 y.o., female Today's Date: 03/14/2024  END OF SESSION:  PT End of Session - 03/14/24 1030     Visit Number 3    Number of Visits 25    Date for PT Re-Evaluation 05/23/24    PT Start Time 1030    PT Stop Time 1110    PT Time Calculation (min) 40 min    Activity Tolerance Patient tolerated treatment well;No increased pain    Behavior During Therapy [facility] for tasks assessed/performed           Past Medical History:  Diagnosis Date   Arthritis    breast cancer 2005   bilateral   Cataract 01/04/13 and 03/01/13   Complication of anesthesia    dizziness    Hyperlipidemia    hypothyroidism    Hypothyroidism    Personal history of chemotherapy    Personal history of radiation therapy    PONV (postoperative nausea and vomiting)    Pre-diabetes    Sleep apnea    mild diagnosis no cpap   Tear of left biceps muscle 01/08/2021   Tinnitus    Past Surgical History:  Procedure Laterality Date   bilateral knee replacement s     BREAST BIOPSY Left 2005   positive   BREAST BIOPSY Right 2005   positive   BREAST LUMPECTOMY Left 2005   BREAST LUMPECTOMY Right 2005   gumm surgery      JOINT REPLACEMENT  2013   by Dr. Aubry Blase   righ tknee arthroscopy      TONSILLECTOMY     TOTAL HIP ARTHROPLASTY Right 11/12/2020   Procedure: RIGHT TOTAL HIP ARTHROPLASTY ANTERIOR APPROACH;  Surgeon: Dayne Even, MD;  Location: [facility];  Service: Orthopedics;  Laterality: Right;   TOTAL HIP ARTHROPLASTY Left 11/25/2021   Procedure: LEFT TOTAL HIP ARTHROPLASTY ANTERIOR APPROACH;  Surgeon: Dayne Even, MD;  Location: [facility];  Service: Orthopedics;  Laterality: Left;   Patient Active Problem List   Diagnosis Date Noted   Pain of left thumb 02/13/2024   Type 2 diabetes mellitus with diabetic chronic kidney disease (HCC) 02/13/2024   Nocturnal leg cramps 11/06/2023   Seasonal allergies  06/20/2023   Sensorineural hearing loss (SNHL) of both ears 10/21/2022   Primary localized osteoarthritis of left hip 11/25/2021   Rosacea 11/10/2021   Chronic hip pain, left 11/10/2021   Chronic pain in left shoulder 05/06/2021   CKD stage 3a, GFR 45-59 ml/min (HCC) 05/06/2021   Mixed stress and urge urinary incontinence 05/06/2021   Pain, dental 02/10/2021   Preoperative evaluation to rule out surgical contraindication 11/04/2020   Primary osteoarthritis of right hip 07/20/2020   Pre-ulcerative corn or callous 07/09/2020   Elevated liver enzymes 04/16/2020   Short-term memory loss 07/31/2019   Educated about COVID-19 virus infection 01/30/2019   Osteopenia determined by x-ray 03/02/2018   Myalgia due to statin 01/02/2018   Stage 2 carcinoma of breast, ER+, unspecified laterality (HCC) 04/29/2017   Onychomycosis of great toe 03/19/2016   Cervical radiculopathy due to degenerative joint disease of spine 03/19/2015   Acromioclavicular joint arthritis 02/19/2015   Rotator cuff dysfunction 01/08/2015   Shoulder pain, bilateral 12/21/2014   Sciatica of left side 10/17/2014   Encounter for Medicare annual wellness exam 05/22/2014   Basal cell carcinoma of leg 12/20/2013   Overweight (BMI 25.0-29.9) 08/08/2013   Statin intolerance 08/07/2013   Balance problem due to vestibular  dysfunction 05/01/2012   Status post total knee replacement 05/01/2012   History of breast cancer 11/09/2011   Acquired hypothyroidism 07/26/2011   Hyperlipidemia associated with type 2 diabetes mellitus (HCC) 07/23/2011    PCP: Thersia Flax, MD  REFERRING PROVIDER: Dayne Even, MD  REFERRING DIAG: [phone] (ICD-10-CM) - Injury of left quadriceps femoris muscle   RATIONALE FOR EVALUATION AND TREATMENT: Rehabilitation  THERAPY DIAG: Pain in left leg  Other abnormalities of gait and mobility  Muscle weakness (generalized)  ONSET DATE: December 22 2023  FOLLOW-UP APPT SCHEDULED WITH REFERRING  PROVIDER: Didn't address   SUBJECTIVE:                                                                                                                                                                                         SUBJECTIVE STATEMENT:  Patient with chief concern of L thigh pain  PERTINENT HISTORY:   Patient is a 83 y.o. female reporting to OPPT with L thigh pain. Patient reports on palm Sunday during a choir performance she was in a static partial forward lunge with a torso rotation (R leg in front). She reports holding that position for a prolonged time and turned toward the choir director and experienced a sharp pain. Following that day she saw her surgeon regarding hip concerns; pt reports that physician cleared her any hip diagnosis or pain stemming from gluteal musculature. Additionally she spoke with her chiropractor; only findings discussed was leg length discrepancy (R shorter than L). She describes her pain as sharp and aching with specific movements and it remains constant. Aggravating factors involve prolonged walking and standing. Pain is alleviated with rest or having the L leg extended and supported on a stool. Patient reports that she has some difficulty with lifting L leg when turning to another direction. Currently she denies numbness, tingling, changes in b/b.   PAIN:    Pain Intensity: Present: 3/10, Best: 2/10, Worst: 4/10 Pain location: L Proximal thigh  Pain Quality: constant, sharp, and aching  Radiating: No  Numbness/Tingling: No Focal Weakness:  Relieving factors: Resting  How long can you sit:  How long can you stand: < 30 min  History of prior back or hip injury, pain, surgery, or therapy: Yes Red flags: Negative for bowel/bladder changes.  PRECAUTIONS: Fall  WEIGHT BEARING RESTRICTIONS: No  FALLS: Has patient fallen in last 6 months? No  Living Environment Lives with: lives alone Lives in: House/apartment Stairs: Yes: Internal: 13 steps; can  reach both and External: 3 steps; can reach both Has following equipment at home: None  Prior level of function: Independent  Occupational demands: Retired  Hobbies: Quilting; Choir Set designer)  Patient Goals: Reduce pain     OBJECTIVE:  Cognition Cognition WNL.     Gross Musculoskeletal Assessment Tremor: None; Bulk: Normal, no muscle wasting noted; Tone: Normal; No erythema, edema, or ecchymosis noted;  GAIT: Distance walked: 90m Assistive device utilized: None Level of assistance: Complete Independence Comments: Slowed velocity, decreased hip ext in LLE  Posture:   AROM AROM (Normal range in degrees) AROM   Lumbar   Flexion (65)   Extension (30)   Right lateral flexion (25)   Left lateral flexion (25)   Right rotation (30)   Left rotation (30)       Hip Right Left  Flexion (125) WNL WNL  Extension (15)    Abduction (40)    Adduction (30)    Internal Rotation (45) WNL WNL  External Rotation (45) WNL WNL      Knee    Flexion (135) WNL WNL  Extension (0) WNL WNL      Ankle    Dorsiflexion (20)    Plantarflexion (50)    Inversion (35)    Eversion (15)    (* = pain; Blank rows = not tested)  LE MMT: MMT (out of 5) Right  Left   Hip flexion 4 3+  Hip extension    Hip abduction  4-  Hip adduction    Hip internal rotation 5 5  Hip external rotation 5 4  Knee flexion 4 4  Knee extension 4 4  Ankle dorsiflexion 5 5  Ankle plantarflexion 5 5  Ankle inversion    Ankle eversion    (* = pain; Blank rows = not tested)  Sensation Grossly intact to light touch throughout bilateral LEs as determined by testing dermatomes L2-S2. Proprioception, stereognosis, and hot/cold testing deferred on this date.  Reflexes R/L Knee Jerk (L3/4): 2+/2+  Ankle Jerk (S1/2): 2+/2+   Muscle Length Hamstrings: R: Negative L: Negative Thomas (hip flexors): R: Negative  L: Positive for Rectus Femoris tightness   Palpation Location Right Left         Lumbar  paraspinals    Quadratus Lumborum    Iliac Crest    ASIS  1  Proximal Rectus Femoris  1      Vastus Lateralis   1  Vastus Medius   1  IT Band   1  Greater Trochanter  1  (Blank rows = not tested) Graded on 0-4 scale (0 = no pain, 1 = pain, 2 = pain with wincing/grimacing/flinching, 3 = pain with withdrawal, 4 = unwilling to allow palpation)  Passive Accessory Intervertebral Motion Deferred   Special Tests  Hip: FABER (SN 81): R: Negative L: Negative FADIR (SN 94): R: Negative L: Negative Hip scour (SN 50): R: Not examined L: Not examined  SIJ:  Thigh Thrust (SN 88, -LR 0.18) : R: Not examined L: Not examined  Piriformis Syndrome: FAIR Test (SN 88, SP 83): R: Not examined L: Not examined  Physical Performance Measures  : 13.71s = .72 m/s 30s STS: 14.5 reps Deep squat: Narrow BOS, minor bilateral knee valgus noted  TODAY'S TREATMENT: DATE: 03/06/2024  Subjective: Patient without any pain in the L thigh today. However she had lower back back following one of the exercises (hip flexor stretches). No further or concerns   Therapeutic Exercise:   Supine Bridge   1 x 10  1 x 12, Blue TB around knee  1 x 12, Blue TB around knee    Supine SLR  against resistance   R: 2 x 10, 5#    L: 3 x 10, 4#   OMEGA Cable Machine:   Seated Knee Extension 1 x 12, 15# 1 x 12, 20#  1 x 12, 30#    Seated Hamstring Curl    2 x 12, 25#   Leg Press    1 x 10 25#    1 x 10 35#   1 x 10 45#  Therapeutic Activity:     Lateral Step Down 6    R: 2 x 10    L: 2 x 10   Forward Step Up 6    R/L: 2 x 10   Sit to Stand from elevated mat table (20)    2 x 10, 3Kg Med ball   Sit to stand with Blue TB around knees    1 x 10, 2Kg Med [employer] [facility]    PATIENT EDUCATION:  Education details: HEP, POC, Prognosis  Person educated: Patient Education method: Explanation, Demonstration, and Handouts Education comprehension: verbalized understanding and returned demonstration   HOME  EXERCISE PROGRAM:  Access Code: [email or web address] URL: [email or web address] Date: 03/06/2024 Prepared by: Veryl Gottron Latitia Housewright  Exercises - Hip Flexor Stretch at Edge of Bed (Mirrored)  - 1 x daily - 7 x weekly - 3 sets - 30s hold - Standing Marching  - 1 x daily - 3-4 x weekly - 2-3 sets - 10-12 reps - 5 hold - Sit to Stand  - 1 x daily - 3-4 x weekly - 2-3 sets - 10 reps - Supine Active Straight Leg Raise  - 1 x daily - 3-4 x weekly - 2-3 sets - 10 reps - Seated March  - 1 x daily - 3-4 x weekly - 2-3 sets - 10 reps  Access Code: [email or web address] URL: [email or web address] Date: 02/29/2024 Prepared by: Veryl Gottron Vasily Fedewa  Exercises - Hip Flexor Stretch at Edge of Bed (Mirrored)  - 1 x daily - 7 x weekly - 3 sets - 30s hold - Standing Marching  - 1 x daily - 3-4 x weekly - 2-3 sets - 10-12 reps - 5 hold - Sit to Stand  - 1 x daily - 3-4 x weekly - 2-3 sets - 10 reps   ASSESSMENT:  CLINICAL IMPRESSION: Continued PT POC focused on improving L thigh pain. Patient tolerated increase in intensity and resistance today without report of additional pain. Reviewed Hip flexor stretch exercises and optimized form to mirror [employer] with use of pillow case. No pain endorsed in the thigh within the last week and slightly sore following last PT session. Bilateral knee valgus noted with sit to stand transfers and lateral step down exercises; mitigated with multimodal cues from TB around knee. PT will continue to monitor progress and expects patient to return to gym program within the next couple weeks given low to no pain. She still has intermittent pain in the L thigh limiting her full participation in recreational activities, driving and prolonged sitting. Based on today's performance, pt will continue to benefit from skilled PT in order to facilitate return to PLOF and improve QoL.   OBJECTIVE IMPAIRMENTS: decreased activity tolerance, difficulty walking, decreased strength, and pain.    ACTIVITY LIMITATIONS: squatting and stairs  PARTICIPATION LIMITATIONS: community activity and church  PERSONAL FACTORS: Age, Past/current experiences, Time since onset of injury/illness/exacerbation, and 1-2 comorbidities: Hx of THA (2024), Type II DM are also affecting patient's functional outcome.   REHAB POTENTIAL: Good  CLINICAL DECISION MAKING: Stable/uncomplicated  EVALUATION COMPLEXITY:  Low   GOALS: Goals reviewed with patient? No  SHORT TERM GOALS: Target date: 04/25/2024  Pt will be independent with HEP in order to improve strength and decrease hip pain to improve pain-free function at home and work. Baseline: Initial HEP provided. Goal status: INITIAL   LONG TERM GOALS: Target date: 06/06/2024  Pt will decrease 30s STS by at least 5 seconds in order to demonstrate clinically significant improvement in LE strength   Baseline: 02/29/2024: 14.5 reps  Goal status: INITIAL   2.  Pt will decrease worst hip pain by at least 3 points on the NPRS in order to demonstrate clinically significant reduction in hip pain. Baseline: 02/29/2024: 4/10 Goal status: INITIAL  3.  Pt will increase by at least 0.13 m/s in order to demonstrate clinically significant improvement in community ambulation.        Baseline: 02/29/2024: .72 m/s Goal status: INITIAL  4.  Pt will increase LEFS by at least 9 points in order to demonstrate significant improvement in lower extremity function.      Baseline: 02/29/2024: 58 / 80 = 72.5 % Goal status: INITIAL  5.  Patient will be able to safely navigate a flight of 10 stairs, using proper foot placement and handrail support, without requiring assistance from PT in order to demonstrate significant improvement in LE strength and safety. Baseline: 02/29/2024: deferred  Goal status: Deferred   PLAN: PT FREQUENCY: 1-2x/week  PT DURATION: 12 weeks  PLANNED INTERVENTIONS: Therapeutic exercises, Therapeutic activity, Neuromuscular re-education,  Balance training, Gait training, Patient/Family education, Self Care, Joint mobilization, Joint manipulation, Vestibular training, Canalith repositioning, Orthotic/Fit training, DME instructions, Dry Needling, Electrical stimulation, Spinal manipulation, Spinal mobilization, Cryotherapy, Moist heat, Taping, Traction, Ultrasound, Ionotophoresis 4mg /ml Dexamethasone, Manual therapy, and Re-evaluation.  PLAN FOR NEXT SESSION: Initiate functional strengthening, Hip flexor stretching, stair navigation,    Satira Curet PT, DPT Physical Therapist- [place]  [facility]  03/14/2024, 10:31 AM

## 2024-03-16 ENCOUNTER — Ambulatory Visit

## 2024-03-16 DIAGNOSIS — M79605 Pain in left leg: Secondary | ICD-10-CM | POA: Diagnosis not present

## 2024-03-16 DIAGNOSIS — M6281 Muscle weakness (generalized): Secondary | ICD-10-CM

## 2024-03-16 DIAGNOSIS — R2689 Other abnormalities of gait and mobility: Secondary | ICD-10-CM

## 2024-03-16 DIAGNOSIS — M79652 Pain in left thigh: Secondary | ICD-10-CM

## 2024-03-16 NOTE — Therapy (Signed)
 OUTPATIENT PHYSICAL THERAPY HIP TREATMENT   Patient Name: Jacqueline Mata MRN: [id number] DOB:[birth year], 83 y.o., female Today's Date: 03/16/2024  END OF SESSION:  PT End of Session - 03/16/24 0819     Visit Number 4    Number of Visits 25    Date for PT Re-Evaluation 05/23/24    PT Start Time 0816    PT Stop Time 0900    PT Time Calculation (min) 44 min    Activity Tolerance Patient tolerated treatment well;No increased pain    Behavior During Therapy [facility] for tasks assessed/performed            Past Medical History:  Diagnosis Date   Arthritis    breast cancer 2005   bilateral   Cataract 01/04/13 and 03/01/13   Complication of anesthesia    dizziness    Hyperlipidemia    hypothyroidism    Hypothyroidism    Personal history of chemotherapy    Personal history of radiation therapy    PONV (postoperative nausea and vomiting)    Pre-diabetes    Sleep apnea    mild diagnosis no cpap   Tear of left biceps muscle 01/08/2021   Tinnitus    Past Surgical History:  Procedure Laterality Date   bilateral knee replacement s     BREAST BIOPSY Left 2005   positive   BREAST BIOPSY Right 2005   positive   BREAST LUMPECTOMY Left 2005   BREAST LUMPECTOMY Right 2005   gumm surgery      JOINT REPLACEMENT  2013   by Dr. Aubry Blase   righ tknee arthroscopy      TONSILLECTOMY     TOTAL HIP ARTHROPLASTY Right 11/12/2020   Procedure: RIGHT TOTAL HIP ARTHROPLASTY ANTERIOR APPROACH;  Surgeon: Dayne Even, MD;  Location: [facility];  Service: Orthopedics;  Laterality: Right;   TOTAL HIP ARTHROPLASTY Left 11/25/2021   Procedure: LEFT TOTAL HIP ARTHROPLASTY ANTERIOR APPROACH;  Surgeon: Dayne Even, MD;  Location: [facility];  Service: Orthopedics;  Laterality: Left;   Patient Active Problem List   Diagnosis Date Noted   Pain of left thumb 02/13/2024   Type 2 diabetes mellitus with diabetic chronic kidney disease (HCC) 02/13/2024   Nocturnal leg cramps 11/06/2023   Seasonal allergies  06/20/2023   Sensorineural hearing loss (SNHL) of both ears 10/21/2022   Primary localized osteoarthritis of left hip 11/25/2021   Rosacea 11/10/2021   Chronic hip pain, left 11/10/2021   Chronic pain in left shoulder 05/06/2021   CKD stage 3a, GFR 45-59 ml/min (HCC) 05/06/2021   Mixed stress and urge urinary incontinence 05/06/2021   Pain, dental 02/10/2021   Preoperative evaluation to rule out surgical contraindication 11/04/2020   Primary osteoarthritis of right hip 07/20/2020   Pre-ulcerative corn or callous 07/09/2020   Elevated liver enzymes 04/16/2020   Short-term memory loss 07/31/2019   Educated about COVID-19 virus infection 01/30/2019   Osteopenia determined by x-ray 03/02/2018   Myalgia due to statin 01/02/2018   Stage 2 carcinoma of breast, ER+, unspecified laterality (HCC) 04/29/2017   Onychomycosis of great toe 03/19/2016   Cervical radiculopathy due to degenerative joint disease of spine 03/19/2015   Acromioclavicular joint arthritis 02/19/2015   Rotator cuff dysfunction 01/08/2015   Shoulder pain, bilateral 12/21/2014   Sciatica of left side 10/17/2014   Encounter for Medicare annual wellness exam 05/22/2014   Basal cell carcinoma of leg 12/20/2013   Overweight (BMI 25.0-29.9) 08/08/2013   Statin intolerance 08/07/2013   Balance problem due to  vestibular dysfunction 05/01/2012   Status post total knee replacement 05/01/2012   History of breast cancer 11/09/2011   Acquired hypothyroidism 07/26/2011   Hyperlipidemia associated with type 2 diabetes mellitus (HCC) 07/23/2011    PCP: Thersia Flax, MD  REFERRING PROVIDER: Dayne Even, MD  REFERRING DIAG: [phone] (ICD-10-CM) - Injury of left quadriceps femoris muscle   RATIONALE FOR EVALUATION AND TREATMENT: Rehabilitation  THERAPY DIAG: Pain in left leg  Other abnormalities of gait and mobility  Muscle weakness (generalized)  Pain in left thigh  ONSET DATE: December 22 2023  FOLLOW-UP APPT  SCHEDULED WITH REFERRING PROVIDER: Didn't address   SUBJECTIVE:                                                                                                                                                                                         SUBJECTIVE STATEMENT:  Patient with chief concern of L thigh pain  PERTINENT HISTORY:   Patient is a 83 y.o. female reporting to OPPT with L thigh pain. Patient reports on palm Sunday during a choir performance she was in a static partial forward lunge with a torso rotation (R leg in front). She reports holding that position for a prolonged time and turned toward the choir director and experienced a sharp pain. Following that day she saw her surgeon regarding hip concerns; pt reports that physician cleared her any hip diagnosis or pain stemming from gluteal musculature. Additionally she spoke with her chiropractor; only findings discussed was leg length discrepancy (R shorter than L). She describes her pain as sharp and aching with specific movements and it remains constant. Aggravating factors involve prolonged walking and standing. Pain is alleviated with rest or having the L leg extended and supported on a stool. Patient reports that she has some difficulty with lifting L leg when turning to another direction. Currently she denies numbness, tingling, changes in b/b.   PAIN:    Pain Intensity: Present: 3/10, Best: 2/10, Worst: 4/10 Pain location: L Proximal thigh  Pain Quality: constant, sharp, and aching  Radiating: No  Numbness/Tingling: No Focal Weakness:  Relieving factors: Resting  How long can you sit:  How long can you stand: < 30 min  History of prior back or hip injury, pain, surgery, or therapy: Yes Red flags: Negative for bowel/bladder changes.  PRECAUTIONS: Fall  WEIGHT BEARING RESTRICTIONS: No  FALLS: Has patient fallen in last 6 months? No  Living Environment Lives with: lives alone Lives in: House/apartment Stairs: Yes:  Internal: 13 steps; can reach both and External: 3 steps; can reach both Has following equipment at home: None  Prior level of function: Independent  Occupational demands: Retired   Presenter, broadcasting: Doctor, general practice; Choir Set designer)  Patient Goals: Reduce pain     OBJECTIVE:  Cognition Cognition WNL.     Gross Musculoskeletal Assessment Tremor: None; Bulk: Normal, no muscle wasting noted; Tone: Normal; No erythema, edema, or ecchymosis noted;  GAIT: Distance walked: 44m Assistive device utilized: None Level of assistance: Complete Independence Comments: Slowed velocity, decreased hip ext in LLE  Posture:   AROM AROM (Normal range in degrees) AROM   Lumbar   Flexion (65)   Extension (30)   Right lateral flexion (25)   Left lateral flexion (25)   Right rotation (30)   Left rotation (30)       Hip Right Left  Flexion (125) WNL WNL  Extension (15)    Abduction (40)    Adduction (30)    Internal Rotation (45) WNL WNL  External Rotation (45) WNL WNL      Knee    Flexion (135) WNL WNL  Extension (0) WNL WNL      Ankle    Dorsiflexion (20)    Plantarflexion (50)    Inversion (35)    Eversion (15)    (* = pain; Blank rows = not tested)  LE MMT: MMT (out of 5) Right  Left   Hip flexion 4 3+  Hip extension    Hip abduction  4-  Hip adduction    Hip internal rotation 5 5  Hip external rotation 5 4  Knee flexion 4 4  Knee extension 4 4  Ankle dorsiflexion 5 5  Ankle plantarflexion 5 5  Ankle inversion    Ankle eversion    (* = pain; Blank rows = not tested)  Sensation Grossly intact to light touch throughout bilateral LEs as determined by testing dermatomes L2-S2. Proprioception, stereognosis, and hot/cold testing deferred on this date.  Reflexes R/L Knee Jerk (L3/4): 2+/2+  Ankle Jerk (S1/2): 2+/2+   Muscle Length Hamstrings: R: Negative L: Negative Thomas (hip flexors): R: Negative  L: Positive for Rectus Femoris tightness   Palpation Location Right  Left         Lumbar paraspinals    Quadratus Lumborum    Iliac Crest    ASIS  1  Proximal Rectus Femoris  1      Vastus Lateralis   1  Vastus Medius   1  IT Band   1  Greater Trochanter  1  (Blank rows = not tested) Graded on 0-4 scale (0 = no pain, 1 = pain, 2 = pain with wincing/grimacing/flinching, 3 = pain with withdrawal, 4 = unwilling to allow palpation)  Passive Accessory Intervertebral Motion Deferred   Special Tests  Hip: FABER (SN 81): R: Negative L: Negative FADIR (SN 94): R: Negative L: Negative Hip scour (SN 50): R: Not examined L: Not examined  SIJ:  Thigh Thrust (SN 88, -LR 0.18) : R: Not examined L: Not examined  Piriformis Syndrome: FAIR Test (SN 88, SP 83): R: Not examined L: Not examined  Physical Performance Measures  : 13.71s = .72 m/s 30s STS: 14.5 reps Deep squat: Narrow BOS, minor bilateral knee valgus noted  TODAY'S TREATMENT: DATE: 03/16/2024  Subjective: Patient reports 2/10 NPS while she was walking into the clinic; 0/10 pain with rest and sitting in the lobby. Pt reports that she still has trouble performing hip flexor stretch on side of the bed. No further or concerns   Therapeutic Exercise:   NuStep L2-1 x 5 min x UE/LE (Seat 9) for  LE warm up, endurance and strength; PT manually adjusted resistance throughout bout.   PT Demo with pt return demo   Hip Flexor Stretch on Step    30s/bout x 2 in order to improve ROM and tissue extensibility   R Sidelying L TFL Stretch    30s/bout x 2 in order to improve ROM and tissue extensibility  R Sidelying Hip Abduction with Slight Flexion (TFL Bias)  L: 3 x 10   Seated Hip Abduction against resistance   L: 2 x 10 x 5s, Blue TB  Hip Matrix Standing Hip Flexion   L: 2 x 10 x 5s 25#   Seated Hip Flexion against resistance    R/L: 2 x 8 x 3s hold, Blue TB around thigh   Supine Bridges for glute activation and reducing hip flexor dominance    3 x 10   PATIENT EDUCATION:  Education  details: HEP, POC, Prognosis  Person educated: Patient Education method: Explanation, Demonstration, and Handouts Education comprehension: verbalized understanding and returned demonstration   HOME EXERCISE PROGRAM:  Access Code: [email or web address] URL: [email or web address] Date: 03/06/2024 Prepared by: Veryl Gottron Amira Podolak  Exercises - Hip Flexor Stretch at Edge of Bed (Mirrored)  - 1 x daily - 7 x weekly - 3 sets - 30s hold - Standing Marching  - 1 x daily - 3-4 x weekly - 2-3 sets - 10-12 reps - 5 hold - Sit to Stand  - 1 x daily - 3-4 x weekly - 2-3 sets - 10 reps - Supine Active Straight Leg Raise  - 1 x daily - 3-4 x weekly - 2-3 sets - 10 reps - Seated March  - 1 x daily - 3-4 x weekly - 2-3 sets - 10 reps  Access Code: [email or web address] URL: [email or web address] Date: 02/29/2024 Prepared by: Veryl Gottron Enolia Koepke  Exercises - Hip Flexor Stretch at Edge of Bed (Mirrored)  - 1 x daily - 7 x weekly - 3 sets - 30s hold - Standing Marching  - 1 x daily - 3-4 x weekly - 2-3 sets - 10-12 reps - 5 hold - Sit to Stand  - 1 x daily - 3-4 x weekly - 2-3 sets - 10 reps   ASSESSMENT:  CLINICAL IMPRESSION: Continued PT POC focused on improving L thigh pain. Patient endorsed continued pain with prolonged walking. PT session with focus on stregnthening hip flexor group (L > R); she tolerated all interventions without pain. PT reviewed hip flexor stretches with use of rail support in order to reduce muscle spasms. Multimodal cues provided to patient for TFL stretch due to compensation with trunk rotation. PT updated HEP; removed supine hip flexor stretch due to lack of proper stretch in iliopsoas/TFL muscles. She still has intermittent pain in the L thigh limiting her full participation in recreational activities, driving and prolonged sitting. Based on today's performance, pt will continue to benefit from skilled PT in order to facilitate return to PLOF and improve QoL.   OBJECTIVE  IMPAIRMENTS: decreased activity tolerance, difficulty walking, decreased strength, and pain.   ACTIVITY LIMITATIONS: squatting and stairs  PARTICIPATION LIMITATIONS: community activity and church  PERSONAL FACTORS: Age, Past/current experiences, Time since onset of injury/illness/exacerbation, and 1-2 comorbidities: Hx of THA (2024), Type II DM are also affecting patient's functional outcome.   REHAB POTENTIAL: Good  CLINICAL DECISION MAKING: Stable/uncomplicated  EVALUATION COMPLEXITY: Low   GOALS: Goals reviewed with patient? No  SHORT TERM GOALS: Target date: 04/27/2024  Pt will be independent with HEP in order to improve  strength and decrease hip pain to improve pain-free function at home and work. Baseline: Initial HEP provided. Goal status: INITIAL   LONG TERM GOALS: Target date: 06/08/2024  Pt will decrease 30s STS by at least 5 seconds in order to demonstrate clinically significant improvement in LE strength   Baseline: 02/29/2024: 14.5 reps  Goal status: INITIAL   2.  Pt will decrease worst hip pain by at least 3 points on the NPRS in order to demonstrate clinically significant reduction in hip pain. Baseline: 02/29/2024: 4/10 Goal status: INITIAL  3.  Pt will increase by at least 0.13 m/s in order to demonstrate clinically significant improvement in community ambulation.        Baseline: 02/29/2024: .72 m/s Goal status: INITIAL  4.  Pt will increase LEFS by at least 9 points in order to demonstrate significant improvement in lower extremity function.      Baseline: 02/29/2024: 58 / 80 = 72.5 % Goal status: INITIAL  5.  Patient will be able to safely navigate a flight of 10 stairs, using proper foot placement and handrail support, without requiring assistance from PT in order to demonstrate significant improvement in LE strength and safety. Baseline: 02/29/2024: deferred  Goal status: Deferred   PLAN: PT FREQUENCY: 1-2x/week  PT DURATION: 12  weeks  PLANNED INTERVENTIONS: Therapeutic exercises, Therapeutic activity, Neuromuscular re-education, Balance training, Gait training, Patient/Family education, Self Care, Joint mobilization, Joint manipulation, Vestibular training, Canalith repositioning, Orthotic/Fit training, DME instructions, Dry Needling, Electrical stimulation, Spinal manipulation, Spinal mobilization, Cryotherapy, Moist heat, Taping, Traction, Ultrasound, Ionotophoresis 4mg /ml Dexamethasone, Manual therapy, and Re-evaluation.  PLAN FOR NEXT SESSION: Initiate functional strengthening, Hip flexor stretching, stair navigation,    Satira Curet PT, DPT Physical Therapist- [facility]  03/16/2024, 12:38 PM

## 2024-03-21 ENCOUNTER — Ambulatory Visit

## 2024-03-21 DIAGNOSIS — R2689 Other abnormalities of gait and mobility: Secondary | ICD-10-CM

## 2024-03-21 DIAGNOSIS — M6281 Muscle weakness (generalized): Secondary | ICD-10-CM

## 2024-03-21 DIAGNOSIS — M79605 Pain in left leg: Secondary | ICD-10-CM

## 2024-03-21 DIAGNOSIS — M79652 Pain in left thigh: Secondary | ICD-10-CM

## 2024-03-21 NOTE — Therapy (Signed)
 OUTPATIENT PHYSICAL THERAPY HIP TREATMENT   Patient Name: Jacqueline Mata MRN: [id number] DOB:1941/01/16, 83 y.o., female Today's Date: 03/21/2024  END OF SESSION:  PT End of Session - 03/21/24 0903     Visit Number 5    Number of Visits 25    Date for PT Re-Evaluation 05/23/24    PT Start Time 0901    PT Stop Time 0940    PT Time Calculation (min) 39 min    Activity Tolerance Patient tolerated treatment well;No increased pain    Behavior During Therapy [facility] for tasks assessed/performed            Past Medical History:  Diagnosis Date   Arthritis    breast cancer 2005   bilateral   Cataract 01/04/13 and 03/01/13   Complication of anesthesia    dizziness    Hyperlipidemia    hypothyroidism    Hypothyroidism    Personal history of chemotherapy    Personal history of radiation therapy    PONV (postoperative nausea and vomiting)    Pre-diabetes    Sleep apnea    mild diagnosis no cpap   Tear of left biceps muscle 01/08/2021   Tinnitus    Past Surgical History:  Procedure Laterality Date   bilateral knee replacement s     BREAST BIOPSY Left 2005   positive   BREAST BIOPSY Right 2005   positive   BREAST LUMPECTOMY Left 2005   BREAST LUMPECTOMY Right 2005   gumm surgery      JOINT REPLACEMENT  2013   by Dr. mardee   righ tknee arthroscopy      TONSILLECTOMY     TOTAL HIP ARTHROPLASTY Right 11/12/2020   Procedure: RIGHT TOTAL HIP ARTHROPLASTY ANTERIOR APPROACH;  Surgeon: Sheril Coy, MD;  Location: [facility];  Service: Orthopedics;  Laterality: Right;   TOTAL HIP ARTHROPLASTY Left 11/25/2021   Procedure: LEFT TOTAL HIP ARTHROPLASTY ANTERIOR APPROACH;  Surgeon: Sheril Coy, MD;  Location: [facility];  Service: Orthopedics;  Laterality: Left;   Patient Active Problem List   Diagnosis Date Noted   Pain of left thumb 02/13/2024   Type 2 diabetes mellitus with diabetic chronic kidney disease (HCC) 02/13/2024   Nocturnal leg cramps 11/06/2023   Seasonal allergies  06/20/2023   Sensorineural hearing loss (SNHL) of both ears 10/21/2022   Primary localized osteoarthritis of left hip 11/25/2021   Rosacea 11/10/2021   Chronic hip pain, left 11/10/2021   Chronic pain in left shoulder 05/06/2021   CKD stage 3a, GFR 45-59 ml/min (HCC) 05/06/2021   Mixed stress and urge urinary incontinence 05/06/2021   Pain, dental 02/10/2021   Preoperative evaluation to rule out surgical contraindication 11/04/2020   Primary osteoarthritis of right hip 07/20/2020   Pre-ulcerative corn or callous 07/09/2020   Elevated liver enzymes 04/16/2020   Short-term memory loss 07/31/2019   Educated about COVID-19 virus infection 01/30/2019   Osteopenia determined by x-ray 03/02/2018   Myalgia due to statin 01/02/2018   Stage 2 carcinoma of breast, ER+, unspecified laterality (HCC) 04/29/2017   Onychomycosis of great toe 03/19/2016   Cervical radiculopathy due to degenerative joint disease of spine 03/19/2015   Acromioclavicular joint arthritis 02/19/2015   Rotator cuff dysfunction 01/08/2015   Shoulder pain, bilateral 12/21/2014   Sciatica of left side 10/17/2014   Encounter for Medicare annual wellness exam 05/22/2014   Basal cell carcinoma of leg 12/20/2013   Overweight (BMI 25.0-29.9) 08/08/2013   Statin intolerance 08/07/2013   Balance problem due to  vestibular dysfunction 05/01/2012   Status post total knee replacement 05/01/2012   History of breast cancer 11/09/2011   Acquired hypothyroidism 07/26/2011   Hyperlipidemia associated with type 2 diabetes mellitus (HCC) 07/23/2011    PCP: Marylynn Verneita CROME, MD  REFERRING PROVIDER: Sheril Coy, MD  REFERRING DIAG: [phone] (ICD-10-CM) - Injury of left quadriceps femoris muscle   RATIONALE FOR EVALUATION AND TREATMENT: Rehabilitation  THERAPY DIAG: Pain in left leg  Other abnormalities of gait and mobility  Muscle weakness (generalized)  Pain in left thigh  ONSET DATE: December 22 2023  FOLLOW-UP APPT  SCHEDULED WITH REFERRING PROVIDER: Didn't address   SUBJECTIVE:                                                                                                                                                                                         SUBJECTIVE STATEMENT:  Patient with chief concern of L thigh pain  PERTINENT HISTORY:   Patient is a 83 y.o. female reporting to OPPT with L thigh pain. Patient reports on palm Sunday during a choir performance she was in a static partial forward lunge with a torso rotation (R leg in front). She reports holding that position for a prolonged time and turned toward the choir director and experienced a sharp pain. Following that day she saw her surgeon regarding hip concerns; pt reports that physician cleared her any hip diagnosis or pain stemming from gluteal musculature. Additionally she spoke with her chiropractor; only findings discussed was leg length discrepancy (R shorter than L). She describes her pain as sharp and aching with specific movements and it remains constant. Aggravating factors involve prolonged walking and standing. Pain is alleviated with rest or having the L leg extended and supported on a stool. Patient reports that she has some difficulty with lifting L leg when turning to another direction. Currently she denies numbness, tingling, changes in b/b.   PAIN:    Pain Intensity: Present: 3/10, Best: 2/10, Worst: 4/10 Pain location: L Proximal thigh  Pain Quality: constant, sharp, and aching  Radiating: No  Numbness/Tingling: No Focal Weakness:  Relieving factors: Resting  How long can you sit:  How long can you stand: < 30 min  History of prior back or hip injury, pain, surgery, or therapy: Yes Red flags: Negative for bowel/bladder changes.  PRECAUTIONS: Fall  WEIGHT BEARING RESTRICTIONS: No  FALLS: Has patient fallen in last 6 months? No  Living Environment Lives with: lives alone Lives in: House/apartment Stairs: Yes:  Internal: 13 steps; can reach both and External: 3 steps; can reach both Has following equipment at home: None  Prior level of function: Independent  Occupational demands: Retired   Presenter, broadcasting: Doctor, general practice; Choir Set designer)  Patient Goals: Reduce pain     OBJECTIVE:  Cognition Cognition WNL.     Gross Musculoskeletal Assessment Tremor: None; Bulk: Normal, no muscle wasting noted; Tone: Normal; No erythema, edema, or ecchymosis noted;  GAIT: Distance walked: 69m Assistive device utilized: None Level of assistance: Complete Independence Comments: Slowed velocity, decreased hip ext in LLE  Posture:   AROM AROM (Normal range in degrees) AROM   Lumbar   Flexion (65)   Extension (30)   Right lateral flexion (25)   Left lateral flexion (25)   Right rotation (30)   Left rotation (30)       Hip Right Left  Flexion (125) WNL WNL  Extension (15)    Abduction (40)    Adduction (30)    Internal Rotation (45) WNL WNL  External Rotation (45) WNL WNL      Knee    Flexion (135) WNL WNL  Extension (0) WNL WNL      Ankle    Dorsiflexion (20)    Plantarflexion (50)    Inversion (35)    Eversion (15)    (* = pain; Blank rows = not tested)  LE MMT: MMT (out of 5) Right  Left   Hip flexion 4 3+  Hip extension    Hip abduction  4-  Hip adduction    Hip internal rotation 5 5  Hip external rotation 5 4  Knee flexion 4 4  Knee extension 4 4  Ankle dorsiflexion 5 5  Ankle plantarflexion 5 5  Ankle inversion    Ankle eversion    (* = pain; Blank rows = not tested)  Sensation Grossly intact to light touch throughout bilateral LEs as determined by testing dermatomes L2-S2. Proprioception, stereognosis, and hot/cold testing deferred on this date.  Reflexes R/L Knee Jerk (L3/4): 2+/2+  Ankle Jerk (S1/2): 2+/2+   Muscle Length Hamstrings: R: Negative L: Negative Thomas (hip flexors): R: Negative  L: Positive for Rectus Femoris tightness   Palpation Location Right  Left         Lumbar paraspinals    Quadratus Lumborum    Iliac Crest    ASIS  1  Proximal Rectus Femoris  1      Vastus Lateralis   1  Vastus Medius   1  IT Band   1  Greater Trochanter  1  (Blank rows = not tested) Graded on 0-4 scale (0 = no pain, 1 = pain, 2 = pain with wincing/grimacing/flinching, 3 = pain with withdrawal, 4 = unwilling to allow palpation)  Passive Accessory Intervertebral Motion Deferred   Special Tests  Hip: FABER (SN 81): R: Negative L: Negative FADIR (SN 94): R: Negative L: Negative Hip scour (SN 50): R: Not examined L: Not examined  SIJ:  Thigh Thrust (SN 88, -LR 0.18) : R: Not examined L: Not examined  Piriformis Syndrome: FAIR Test (SN 88, SP 83): R: Not examined L: Not examined  Physical Performance Measures  : 13.71s = .72 m/s 30s STS: 14.5 reps Deep squat: Narrow BOS, minor bilateral knee valgus noted  TODAY'S TREATMENT: DATE: 03/21/2024  Subjective: Patient reports minor ache in the left anterior proximal hip still with walking. Pain has improved from diffuse anterior pain to anterorateral towards ASIS. Patient endorsed improvements with stairway navigation, easier to perform since starting PT. No further or concerns   Therapeutic Exercise:   NuStep L4-1 x 5 min x UE/LE (Seat 8) for  LE warm up, endurance and strength; PT manually adjusted resistance throughout bout.   Hip Matrix   Hip Flexion    L: 3 x 12, 30#   Hip Extension   L: 3 x 12, 30#  R Sidelying Hip Abduction  1 x 10 AROM  2 x 10 PT resistance applied at distal ankle   Standing Hip Flexor Stretch with Foot on 2nd step   L: 30s/bout x 3 in order to improve ROM and tissue extensibility  - Added overhead stretch in order to incr. Stretch in hip flexor  Therapeutic Activity:   Standing Hip Flexion Marches against resistance (SBA for safety)  R/L: 1 x 8 against YTB around feet, SUE   R/L: 1 x 8 against YTB around feet, No UE Support   Forward Step Down from 6  step  R/L: 2 x 10, BUE support    - progressed from toe touch to heel touch  PATIENT EDUCATION:  Education details: HEP, POC, Prognosis  Person educated: Patient Education method: Explanation, Demonstration, and Handouts Education comprehension: verbalized understanding and returned demonstration   HOME EXERCISE PROGRAM:  Access Code: [email or web address] URL: [email or web address] Date: 03/21/2024 Prepared by: Lonni Pall  Exercises - Standing Marching  - 1 x daily - 3-4 x weekly - 2-3 sets - 10-12 reps - 5 hold - Sit to Stand  - 1 x daily - 3-4 x weekly - 2-3 sets - 10 reps - Supine Active Straight Leg Raise  - 1 x daily - 3-4 x weekly - 2-3 sets - 10 reps - Clam with Resistance  - 1 x daily - 3-4 x weekly - 2-3 sets - 10-12 reps - Seated March with Resistance  - 1 x daily - 3-4 x weekly - 2-3 sets - 10-12 reps - Sidelying Hip Abduction  - 1 x daily - 3-4 x weekly - 2-3 sets - 10-12 reps - Supine Bridge  - 1 x daily - 3-4 x weekly - 2-3 sets - 10-12 reps - Lateral Step Down  - 1 x daily - 3-4 x weekly - 2-3 sets - 10 reps - Hip Flexor Stretch on Step  - 1 x daily - 7 x weekly - 3 sets - 30s hold - Forward Step Down Touch with Heel  - 1 x daily - 3-4 x weekly - 2-3 sets - 10 reps  Access Code: [email or web address] URL: [email or web address] Date: 03/06/2024 Prepared by: Lonni Maryrose Colvin  Exercises - Hip Flexor Stretch at Edge of Bed (Mirrored)  - 1 x daily - 7 x weekly - 3 sets - 30s hold - Standing Marching  - 1 x daily - 3-4 x weekly - 2-3 sets - 10-12 reps - 5 hold - Sit to Stand  - 1 x daily - 3-4 x weekly - 2-3 sets - 10 reps - Supine Active Straight Leg Raise  - 1 x daily - 3-4 x weekly - 2-3 sets - 10 reps - Seated March  - 1 x daily - 3-4 x weekly - 2-3 sets - 10 reps  ASSESSMENT:  CLINICAL IMPRESSION: Continued PT POC focused on improving L anterior hip pain. Pain has localized from diffuse anterior hip discomfort to a more focused anterolateral region near  the ASIS, suggesting positive tissue response and decreased regional inflammation. Patient also reports functional improvements, specifically stair navigation has become easier since initiating PT, indicating increased quadriceps and hip flexor coordination and strength. Patient tolerated all exercises without exacerbation of symptoms. She still has intermittent pain in  the L thigh limiting her full participation in recreational activities, driving and prolonged sitting. Based on today's performance, pt will continue to benefit from skilled PT in order to address strength deficits, improve gait mechanics, and progress functional activity tolerance.  OBJECTIVE IMPAIRMENTS: decreased activity tolerance, difficulty walking, decreased strength, and pain.   ACTIVITY LIMITATIONS: squatting and stairs  PARTICIPATION LIMITATIONS: community activity and church  PERSONAL FACTORS: Age, Past/current experiences, Time since onset of injury/illness/exacerbation, and 1-2 comorbidities: Hx of THA (2024), Type II DM are also affecting patient's functional outcome.   REHAB POTENTIAL: Good  CLINICAL DECISION MAKING: Stable/uncomplicated  EVALUATION COMPLEXITY: Low   GOALS: Goals reviewed with patient? No  SHORT TERM GOALS: Target date: 05/02/2024  Pt will be independent with HEP in order to improve strength and decrease hip pain to improve pain-free function at home and work. Baseline: Initial HEP provided. Goal status: INITIAL   LONG TERM GOALS: Target date: 06/13/2024  Pt will decrease 30s STS by at least 5 seconds in order to demonstrate clinically significant improvement in LE strength   Baseline: 02/29/2024: 14.5 reps  Goal status: INITIAL   2.  Pt will decrease worst hip pain by at least 3 points on the NPRS in order to demonstrate clinically significant reduction in hip pain. Baseline: 02/29/2024: 4/10 Goal status: INITIAL  3.  Pt will increase by at least 0.13 m/s in order to demonstrate  clinically significant improvement in community ambulation.        Baseline: 02/29/2024: .72 m/s Goal status: INITIAL  4.  Pt will increase LEFS by at least 9 points in order to demonstrate significant improvement in lower extremity function.      Baseline: 02/29/2024: 58 / 80 = 72.5 % Goal status: INITIAL  5.  Patient will be able to safely navigate a flight of 10 stairs, using proper foot placement and handrail support, without requiring assistance from PT in order to demonstrate significant improvement in LE strength and safety. Baseline: 02/29/2024: deferred  Goal status: Deferred   PLAN: PT FREQUENCY: 1-2x/week  PT DURATION: 12 weeks  PLANNED INTERVENTIONS: Therapeutic exercises, Therapeutic activity, Neuromuscular re-education, Balance training, Gait training, Patient/Family education, Self Care, Joint mobilization, Joint manipulation, Vestibular training, Canalith repositioning, Orthotic/Fit training, DME instructions, Dry Needling, Electrical stimulation, Spinal manipulation, Spinal mobilization, Cryotherapy, Moist heat, Taping, Traction, Ultrasound, Ionotophoresis 4mg /ml Dexamethasone, Manual therapy, and Re-evaluation.  PLAN FOR NEXT SESSION: Progression functional strengthening, Hip flexor stretching, stair navigation,    Lonni Pall PT, DPT Physical Therapist- [place]  [facility]  03/21/2024, 9:04 AM

## 2024-03-23 ENCOUNTER — Ambulatory Visit

## 2024-03-23 DIAGNOSIS — M79605 Pain in left leg: Secondary | ICD-10-CM | POA: Diagnosis not present

## 2024-03-23 DIAGNOSIS — M79652 Pain in left thigh: Secondary | ICD-10-CM

## 2024-03-23 DIAGNOSIS — M6281 Muscle weakness (generalized): Secondary | ICD-10-CM

## 2024-03-23 DIAGNOSIS — R2689 Other abnormalities of gait and mobility: Secondary | ICD-10-CM

## 2024-03-23 NOTE — Therapy (Signed)
 OUTPATIENT PHYSICAL THERAPY HIP TREATMENT   Patient Name: Jacqueline Mata MRN: [id number] DOB:[birth year], 83 y.o., female Today's Date: 03/23/2024  END OF SESSION:  PT End of Session - 03/23/24 0818     Visit Number 6    Number of Visits 25    Date for PT Re-Evaluation 05/23/24    PT Start Time 0815    PT Stop Time 0900    PT Time Calculation (min) 45 min    Activity Tolerance Patient tolerated treatment well;No increased pain    Behavior During Therapy [facility] for tasks assessed/performed            Past Medical History:  Diagnosis Date   Arthritis    breast cancer 2005   bilateral   Cataract 01/04/13 and 03/01/13   Complication of anesthesia    dizziness    Hyperlipidemia    hypothyroidism    Hypothyroidism    Personal history of chemotherapy    Personal history of radiation therapy    PONV (postoperative nausea and vomiting)    Pre-diabetes    Sleep apnea    mild diagnosis no cpap   Tear of left biceps muscle 01/08/2021   Tinnitus    Past Surgical History:  Procedure Laterality Date   bilateral knee replacement s     BREAST BIOPSY Left 2005   positive   BREAST BIOPSY Right 2005   positive   BREAST LUMPECTOMY Left 2005   BREAST LUMPECTOMY Right 2005   gumm surgery      JOINT REPLACEMENT  2013   by Dr. mardee   righ tknee arthroscopy      TONSILLECTOMY     TOTAL HIP ARTHROPLASTY Right 11/12/2020   Procedure: RIGHT TOTAL HIP ARTHROPLASTY ANTERIOR APPROACH;  Surgeon: Sheril Coy, MD;  Location: [facility];  Service: Orthopedics;  Laterality: Right;   TOTAL HIP ARTHROPLASTY Left 11/25/2021   Procedure: LEFT TOTAL HIP ARTHROPLASTY ANTERIOR APPROACH;  Surgeon: Sheril Coy, MD;  Location: [facility];  Service: Orthopedics;  Laterality: Left;   Patient Active Problem List   Diagnosis Date Noted   Pain of left thumb 02/13/2024   Type 2 diabetes mellitus with diabetic chronic kidney disease (HCC) 02/13/2024   Nocturnal leg cramps 11/06/2023   Seasonal allergies  06/20/2023   Sensorineural hearing loss (SNHL) of both ears 10/21/2022   Primary localized osteoarthritis of left hip 11/25/2021   Rosacea 11/10/2021   Chronic hip pain, left 11/10/2021   Chronic pain in left shoulder 05/06/2021   CKD stage 3a, GFR 45-59 ml/min (HCC) 05/06/2021   Mixed stress and urge urinary incontinence 05/06/2021   Pain, dental 02/10/2021   Preoperative evaluation to rule out surgical contraindication 11/04/2020   Primary osteoarthritis of right hip 07/20/2020   Pre-ulcerative corn or callous 07/09/2020   Elevated liver enzymes 04/16/2020   Short-term memory loss 07/31/2019   Educated about COVID-19 virus infection 01/30/2019   Osteopenia determined by x-ray 03/02/2018   Myalgia due to statin 01/02/2018   Stage 2 carcinoma of breast, ER+, unspecified laterality (HCC) 04/29/2017   Onychomycosis of great toe 03/19/2016   Cervical radiculopathy due to degenerative joint disease of spine 03/19/2015   Acromioclavicular joint arthritis 02/19/2015   Rotator cuff dysfunction 01/08/2015   Shoulder pain, bilateral 12/21/2014   Sciatica of left side 10/17/2014   Encounter for Medicare annual wellness exam 05/22/2014   Basal cell carcinoma of leg 12/20/2013   Overweight (BMI 25.0-29.9) 08/08/2013   Statin intolerance 08/07/2013   Balance problem due to  vestibular dysfunction 05/01/2012   Status post total knee replacement 05/01/2012   History of breast cancer 11/09/2011   Acquired hypothyroidism 07/26/2011   Hyperlipidemia associated with type 2 diabetes mellitus (HCC) 07/23/2011    PCP: Marylynn Verneita CROME, MD  REFERRING PROVIDER: Sheril Coy, MD  REFERRING DIAG: [phone] (ICD-10-CM) - Injury of left quadriceps femoris muscle   RATIONALE FOR EVALUATION AND TREATMENT: Rehabilitation  THERAPY DIAG: Pain in left leg  Other abnormalities of gait and mobility  Muscle weakness (generalized)  Pain in left thigh  ONSET DATE: December 22 2023  FOLLOW-UP APPT  SCHEDULED WITH REFERRING PROVIDER: Didn't address   SUBJECTIVE:                                                                                                                                                                                         SUBJECTIVE STATEMENT:  Patient with chief concern of L thigh pain  PERTINENT HISTORY:   Patient is a 83 y.o. female reporting to OPPT with L thigh pain. Patient reports on palm Sunday during a choir performance she was in a static partial forward lunge with a torso rotation (R leg in front). She reports holding that position for a prolonged time and turned toward the choir director and experienced a sharp pain. Following that day she saw her surgeon regarding hip concerns; pt reports that physician cleared her any hip diagnosis or pain stemming from gluteal musculature. Additionally she spoke with her chiropractor; only findings discussed was leg length discrepancy (R shorter than L). She describes her pain as sharp and aching with specific movements and it remains constant. Aggravating factors involve prolonged walking and standing. Pain is alleviated with rest or having the L leg extended and supported on a stool. Patient reports that she has some difficulty with lifting L leg when turning to another direction. Currently she denies numbness, tingling, changes in b/b.   PAIN:    Pain Intensity: Present: 3/10, Best: 2/10, Worst: 4/10 Pain location: L Proximal thigh  Pain Quality: constant, sharp, and aching  Radiating: No  Numbness/Tingling: No Focal Weakness:  Relieving factors: Resting  How long can you sit:  How long can you stand: < 30 min  History of prior back or hip injury, pain, surgery, or therapy: Yes Red flags: Negative for bowel/bladder changes.  PRECAUTIONS: Fall  WEIGHT BEARING RESTRICTIONS: No  FALLS: Has patient fallen in last 6 months? No  Living Environment Lives with: lives alone Lives in: House/apartment Stairs: Yes:  Internal: 13 steps; can reach both and External: 3 steps; can reach both Has following equipment at home: None  Prior level of function: Independent  Occupational demands: Retired   Presenter, broadcasting: Doctor, general practice; Choir Set designer)  Patient Goals: Reduce pain     OBJECTIVE:  Cognition Cognition WNL.     Gross Musculoskeletal Assessment Tremor: None; Bulk: Normal, no muscle wasting noted; Tone: Normal; No erythema, edema, or ecchymosis noted;  GAIT: Distance walked: 47m Assistive device utilized: None Level of assistance: Complete Independence Comments: Slowed velocity, decreased hip ext in LLE  Posture:   AROM AROM (Normal range in degrees) AROM   Lumbar   Flexion (65)   Extension (30)   Right lateral flexion (25)   Left lateral flexion (25)   Right rotation (30)   Left rotation (30)       Hip Right Left  Flexion (125) WNL WNL  Extension (15)    Abduction (40)    Adduction (30)    Internal Rotation (45) WNL WNL  External Rotation (45) WNL WNL      Knee    Flexion (135) WNL WNL  Extension (0) WNL WNL      Ankle    Dorsiflexion (20)    Plantarflexion (50)    Inversion (35)    Eversion (15)    (* = pain; Blank rows = not tested)  LE MMT: MMT (out of 5) Right  Left   Hip flexion 4 3+  Hip extension    Hip abduction  4-  Hip adduction    Hip internal rotation 5 5  Hip external rotation 5 4  Knee flexion 4 4  Knee extension 4 4  Ankle dorsiflexion 5 5  Ankle plantarflexion 5 5  Ankle inversion    Ankle eversion    (* = pain; Blank rows = not tested)  Sensation Grossly intact to light touch throughout bilateral LEs as determined by testing dermatomes L2-S2. Proprioception, stereognosis, and hot/cold testing deferred on this date.  Reflexes R/L Knee Jerk (L3/4): 2+/2+  Ankle Jerk (S1/2): 2+/2+   Muscle Length Hamstrings: R: Negative L: Negative Thomas (hip flexors): R: Negative  L: Positive for Rectus Femoris tightness   Palpation Location Right  Left         Lumbar paraspinals    Quadratus Lumborum    Iliac Crest    ASIS  1  Proximal Rectus Femoris  1      Vastus Lateralis   1  Vastus Medius   1  IT Band   1  Greater Trochanter  1  (Blank rows = not tested) Graded on 0-4 scale (0 = no pain, 1 = pain, 2 = pain with wincing/grimacing/flinching, 3 = pain with withdrawal, 4 = unwilling to allow palpation)  Passive Accessory Intervertebral Motion Deferred   Special Tests  Hip: FABER (SN 81): R: Negative L: Negative FADIR (SN 94): R: Negative L: Negative Hip scour (SN 50): R: Not examined L: Not examined  SIJ:  Thigh Thrust (SN 88, -LR 0.18) : R: Not examined L: Not examined  Piriformis Syndrome: FAIR Test (SN 88, SP 83): R: Not examined L: Not examined  Physical Performance Measures  : 13.71s = .72 m/s 30s STS: 14.5 reps Deep squat: Narrow BOS, minor bilateral knee valgus noted  TODAY'S TREATMENT: DATE: 03/23/2024  Subjective: Patient reports pain in the L foot following the last PT session, she suspects the exercises that demand SLS stance. Patient reports improvements in the L hip but still has small pain in the proximal hip (near ASIS). No further or concerns   Therapeutic Exercise:   NuStep L5-2 x 5 min x UE/LE (Seat  8) for LE warm up, endurance and strength; PT manually adjusted resistance throughout bout.   OMEGA Cable Machine:   Seated Knee Extension 3 x 10, 20#  Seated Hamstring Curl   1 x 10, 25#   2 x 10,  35#    Supine Hip Flexion against resistance   R/L: 2 x 10, Red TB     1 x 8, Green TB     Supine SLR   L: 1 x 10 AROM, 1 x 10 (PT resistance at foot), 1 x 8 (PT resistance at knee)   Standing Hip Flexor Stretch with Foot on 2nd step   L: 30s/bout x 3 in order to improve ROM and tissue extensibility  - Added overhead stretch in order to incr. Stretch in hip flexor  Therapeutic Activity:   Kettle bell Squats  2 x 8, 10#  1 x 6, 20#  Standing Hip Flexion March  Alternating  R/L: 2 x 16, 5# AW donned   PATIENT EDUCATION:  Education details: HEP, POC, Prognosis  Person educated: Patient Education method: Explanation, Demonstration, and Handouts Education comprehension: verbalized understanding and returned demonstration   HOME EXERCISE PROGRAM:  Access Code: [email or web address] URL: [email or web address] Date: 03/21/2024 Prepared by: Lonni Pall  Exercises - Standing Marching  - 1 x daily - 3-4 x weekly - 2-3 sets - 10-12 reps - 5 hold - Sit to Stand  - 1 x daily - 3-4 x weekly - 2-3 sets - 10 reps - Supine Active Straight Leg Raise  - 1 x daily - 3-4 x weekly - 2-3 sets - 10 reps - Clam with Resistance  - 1 x daily - 3-4 x weekly - 2-3 sets - 10-12 reps - Seated March with Resistance  - 1 x daily - 3-4 x weekly - 2-3 sets - 10-12 reps - Sidelying Hip Abduction  - 1 x daily - 3-4 x weekly - 2-3 sets - 10-12 reps - Supine Bridge  - 1 x daily - 3-4 x weekly - 2-3 sets - 10-12 reps - Lateral Step Down  - 1 x daily - 3-4 x weekly - 2-3 sets - 10 reps - Hip Flexor Stretch on Step  - 1 x daily - 7 x weekly - 3 sets - 30s hold - Forward Step Down Touch with Heel  - 1 x daily - 3-4 x weekly - 2-3 sets - 10 reps  Access Code: [email or web address] URL: [email or web address] Date: 03/06/2024 Prepared by: Lonni Kelci Petrella  Exercises - Hip Flexor Stretch at Edge of Bed (Mirrored)  - 1 x daily - 7 x weekly - 3 sets - 30s hold - Standing Marching  - 1 x daily - 3-4 x weekly - 2-3 sets - 10-12 reps - 5 hold - Sit to Stand  - 1 x daily - 3-4 x weekly - 2-3 sets - 10 reps - Supine Active Straight Leg Raise  - 1 x daily - 3-4 x weekly - 2-3 sets - 10 reps - Seated March  - 1 x daily - 3-4 x weekly - 2-3 sets - 10 reps  ASSESSMENT:  CLINICAL IMPRESSION: Continued PT POC focused on improving L anterior hip pain. PT increased intensity of interventions focused on quadricep and hip flexor strengthening. Functional movement patterns such as kettle bell squat and  resisted marching in order to improve global functional strength. She continues to demonstrate steady improvements in LE strength and reduced hip pain. PT deferred exercises in standing demanding prolonged SLS in order to  reduce pain in L foot. Patient tolerated all exercises without exacerbation of symptoms. She still has intermittent pain in the L thigh limiting her full participation in recreational activities, driving and prolonged sitting. Based on today's performance, pt will continue to benefit from skilled PT in order to address strength deficits, improve gait mechanics, and progress functional activity tolerance.  OBJECTIVE IMPAIRMENTS: decreased activity tolerance, difficulty walking, decreased strength, and pain.   ACTIVITY LIMITATIONS: squatting and stairs  PARTICIPATION LIMITATIONS: community activity and church  PERSONAL FACTORS: Age, Past/current experiences, Time since onset of injury/illness/exacerbation, and 1-2 comorbidities: Hx of THA (2024), Type II DM are also affecting patient's functional outcome.   REHAB POTENTIAL: Good  CLINICAL DECISION MAKING: Stable/uncomplicated  EVALUATION COMPLEXITY: Low   GOALS: Goals reviewed with patient? No  SHORT TERM GOALS: Target date: 05/04/2024  Pt will be independent with HEP in order to improve strength and decrease hip pain to improve pain-free function at home and work. Baseline: Initial HEP provided. Goal status: INITIAL   LONG TERM GOALS: Target date: 06/15/2024  Pt will decrease 30s STS by at least 5 seconds in order to demonstrate clinically significant improvement in LE strength   Baseline: 02/29/2024: 14.5 reps  Goal status: INITIAL   2.  Pt will decrease worst hip pain by at least 3 points on the NPRS in order to demonstrate clinically significant reduction in hip pain. Baseline: 02/29/2024: 4/10 Goal status: INITIAL  3.  Pt will increase by at least 0.13 m/s in order to demonstrate clinically significant  improvement in community ambulation.        Baseline: 02/29/2024: .72 m/s Goal status: INITIAL  4.  Pt will increase LEFS by at least 9 points in order to demonstrate significant improvement in lower extremity function.      Baseline: 02/29/2024: 58 / 80 = 72.5 % Goal status: INITIAL  5.  Patient will be able to safely navigate a flight of 10 stairs, using proper foot placement and handrail support, without requiring assistance from PT in order to demonstrate significant improvement in LE strength and safety. Baseline: 02/29/2024: deferred  Goal status: Deferred   PLAN: PT FREQUENCY: 1-2x/week  PT DURATION: 12 weeks  PLANNED INTERVENTIONS: Therapeutic exercises, Therapeutic activity, Neuromuscular re-education, Balance training, Gait training, Patient/Family education, Self Care, Joint mobilization, Joint manipulation, Vestibular training, Canalith repositioning, Orthotic/Fit training, DME instructions, Dry Needling, Electrical stimulation, Spinal manipulation, Spinal mobilization, Cryotherapy, Moist heat, Taping, Traction, Ultrasound, Ionotophoresis 4mg /ml Dexamethasone, Manual therapy, and Re-evaluation.  PLAN FOR NEXT SESSION: Progression functional strengthening, Hip flexor stretching, stair navigation,    Lonni Pall PT, DPT Physical Therapist- [place]  [facility]  03/23/2024, 8:20 AM

## 2024-03-25 ENCOUNTER — Other Ambulatory Visit: Payer: Self-pay | Admitting: Internal Medicine

## 2024-03-27 ENCOUNTER — Ambulatory Visit

## 2024-03-27 DIAGNOSIS — M79652 Pain in left thigh: Secondary | ICD-10-CM

## 2024-03-27 DIAGNOSIS — M79605 Pain in left leg: Secondary | ICD-10-CM | POA: Diagnosis not present

## 2024-03-27 DIAGNOSIS — M6281 Muscle weakness (generalized): Secondary | ICD-10-CM

## 2024-03-27 DIAGNOSIS — R2689 Other abnormalities of gait and mobility: Secondary | ICD-10-CM

## 2024-03-27 NOTE — Therapy (Signed)
 OUTPATIENT PHYSICAL THERAPY HIP TREATMENT   Patient Name: Jacqueline Mata MRN: [id number] DOB:03/22/1941, 83 y.o., female Today's Date: 03/27/2024  END OF SESSION:  PT End of Session - 03/27/24 1432     Number of Visits 25    Date for PT Re-Evaluation 05/23/24    PT Start Time 1432    PT Stop Time 1515    PT Time Calculation (min) 43 min    Activity Tolerance Patient tolerated treatment well;No increased pain    Behavior During Therapy [facility] for tasks assessed/performed            Past Medical History:  Diagnosis Date   Arthritis    breast cancer 2005   bilateral   Cataract 01/04/13 and 03/01/13   Complication of anesthesia    dizziness    Hyperlipidemia    hypothyroidism    Hypothyroidism    Personal history of chemotherapy    Personal history of radiation therapy    PONV (postoperative nausea and vomiting)    Pre-diabetes    Sleep apnea    mild diagnosis no cpap   Tear of left biceps muscle 01/08/2021   Tinnitus    Past Surgical History:  Procedure Laterality Date   bilateral knee replacement s     BREAST BIOPSY Left 2005   positive   BREAST BIOPSY Right 2005   positive   BREAST LUMPECTOMY Left 2005   BREAST LUMPECTOMY Right 2005   gumm surgery      JOINT REPLACEMENT  2013   by Dr. mardee   righ tknee arthroscopy      TONSILLECTOMY     TOTAL HIP ARTHROPLASTY Right 11/12/2020   Procedure: RIGHT TOTAL HIP ARTHROPLASTY ANTERIOR APPROACH;  Surgeon: Sheril Coy, MD;  Location: [facility];  Service: Orthopedics;  Laterality: Right;   TOTAL HIP ARTHROPLASTY Left 11/25/2021   Procedure: LEFT TOTAL HIP ARTHROPLASTY ANTERIOR APPROACH;  Surgeon: Sheril Coy, MD;  Location: [facility];  Service: Orthopedics;  Laterality: Left;   Patient Active Problem List   Diagnosis Date Noted   Pain of left thumb 02/13/2024   Type 2 diabetes mellitus with diabetic chronic kidney disease (HCC) 02/13/2024   Nocturnal leg cramps 11/06/2023   Seasonal allergies 06/20/2023    Sensorineural hearing loss (SNHL) of both ears 10/21/2022   Primary localized osteoarthritis of left hip 11/25/2021   Rosacea 11/10/2021   Chronic hip pain, left 11/10/2021   Chronic pain in left shoulder 05/06/2021   CKD stage 3a, GFR 45-59 ml/min (HCC) 05/06/2021   Mixed stress and urge urinary incontinence 05/06/2021   Pain, dental 02/10/2021   Preoperative evaluation to rule out surgical contraindication 11/04/2020   Primary osteoarthritis of right hip 07/20/2020   Pre-ulcerative corn or callous 07/09/2020   Elevated liver enzymes 04/16/2020   Short-term memory loss 07/31/2019   Educated about COVID-19 virus infection 01/30/2019   Osteopenia determined by x-ray 03/02/2018   Myalgia due to statin 01/02/2018   Stage 2 carcinoma of breast, ER+, unspecified laterality (HCC) 04/29/2017   Onychomycosis of great toe 03/19/2016   Cervical radiculopathy due to degenerative joint disease of spine 03/19/2015   Acromioclavicular joint arthritis 02/19/2015   Rotator cuff dysfunction 01/08/2015   Shoulder pain, bilateral 12/21/2014   Sciatica of left side 10/17/2014   Encounter for Medicare annual wellness exam 05/22/2014   Basal cell carcinoma of leg 12/20/2013   Overweight (BMI 25.0-29.9) 08/08/2013   Statin intolerance 08/07/2013   Balance problem due to vestibular dysfunction 05/01/2012   Status  post total knee replacement 05/01/2012   History of breast cancer 11/09/2011   Acquired hypothyroidism 07/26/2011   Hyperlipidemia associated with type 2 diabetes mellitus (HCC) 07/23/2011    PCP: Marylynn Verneita CROME, MD  REFERRING PROVIDER: Sheril Coy, MD  REFERRING DIAG: [phone] (ICD-10-CM) - Injury of left quadriceps femoris muscle   RATIONALE FOR EVALUATION AND TREATMENT: Rehabilitation  THERAPY DIAG: Pain in left leg  Other abnormalities of gait and mobility  Muscle weakness (generalized)  Pain in left thigh  ONSET DATE: December 22 2023  FOLLOW-UP APPT SCHEDULED WITH  REFERRING PROVIDER: Didn't address   SUBJECTIVE:                                                                                                                                                                                         SUBJECTIVE STATEMENT:  Patient with chief concern of L thigh pain  PERTINENT HISTORY:   Patient is a 83 y.o. female reporting to OPPT with L thigh pain. Patient reports on palm Sunday during a choir performance she was in a static partial forward lunge with a torso rotation (R leg in front). She reports holding that position for a prolonged time and turned toward the choir director and experienced a sharp pain. Following that day she saw her surgeon regarding hip concerns; pt reports that physician cleared her any hip diagnosis or pain stemming from gluteal musculature. Additionally she spoke with her chiropractor; only findings discussed was leg length discrepancy (R shorter than L). She describes her pain as sharp and aching with specific movements and it remains constant. Aggravating factors involve prolonged walking and standing. Pain is alleviated with rest or having the L leg extended and supported on a stool. Patient reports that she has some difficulty with lifting L leg when turning to another direction. Currently she denies numbness, tingling, changes in b/b.   PAIN:    Pain Intensity: Present: 3/10, Best: 2/10, Worst: 4/10 Pain location: L Proximal thigh  Pain Quality: constant, sharp, and aching  Radiating: No  Numbness/Tingling: No Focal Weakness:  Relieving factors: Resting  How long can you sit:  How long can you stand: < 30 min  History of prior back or hip injury, pain, surgery, or therapy: Yes Red flags: Negative for bowel/bladder changes.  PRECAUTIONS: Fall  WEIGHT BEARING RESTRICTIONS: No  FALLS: Has patient fallen in last 6 months? No  Living Environment Lives with: lives alone Lives in: House/apartment Stairs: Yes: Internal: 13  steps; can reach both and External: 3 steps; can reach both Has following equipment at home: None  Prior level of function: Independent  Occupational demands: Retired  Hobbies: Quilting; Choir Set designer)  Patient Goals: Reduce pain     OBJECTIVE:  Cognition Cognition WNL.     Gross Musculoskeletal Assessment Tremor: None; Bulk: Normal, no muscle wasting noted; Tone: Normal; No erythema, edema, or ecchymosis noted;  GAIT: Distance walked: 69m Assistive device utilized: None Level of assistance: Complete Independence Comments: Slowed velocity, decreased hip ext in LLE  Posture:   AROM AROM (Normal range in degrees) AROM   Lumbar   Flexion (65)   Extension (30)   Right lateral flexion (25)   Left lateral flexion (25)   Right rotation (30)   Left rotation (30)       Hip Right Left  Flexion (125) WNL WNL  Extension (15)    Abduction (40)    Adduction (30)    Internal Rotation (45) WNL WNL  External Rotation (45) WNL WNL      Knee    Flexion (135) WNL WNL  Extension (0) WNL WNL      Ankle    Dorsiflexion (20)    Plantarflexion (50)    Inversion (35)    Eversion (15)    (* = pain; Blank rows = not tested)  LE MMT: MMT (out of 5) Right  Left   Hip flexion 4 3+  Hip extension    Hip abduction  4-  Hip adduction    Hip internal rotation 5 5  Hip external rotation 5 4  Knee flexion 4 4  Knee extension 4 4  Ankle dorsiflexion 5 5  Ankle plantarflexion 5 5  Ankle inversion    Ankle eversion    (* = pain; Blank rows = not tested)  Sensation Grossly intact to light touch throughout bilateral LEs as determined by testing dermatomes L2-S2. Proprioception, stereognosis, and hot/cold testing deferred on this date.  Reflexes R/L Knee Jerk (L3/4): 2+/2+  Ankle Jerk (S1/2): 2+/2+   Muscle Length Hamstrings: R: Negative L: Negative Thomas (hip flexors): R: Negative  L: Positive for Rectus Femoris tightness   Palpation Location Right Left          Lumbar paraspinals    Quadratus Lumborum    Iliac Crest    ASIS  1  Proximal Rectus Femoris  1      Vastus Lateralis   1  Vastus Medius   1  IT Band   1  Greater Trochanter  1  (Blank rows = not tested) Graded on 0-4 scale (0 = no pain, 1 = pain, 2 = pain with wincing/grimacing/flinching, 3 = pain with withdrawal, 4 = unwilling to allow palpation)  Passive Accessory Intervertebral Motion Deferred   Special Tests  Hip: FABER (SN 81): R: Negative L: Negative FADIR (SN 94): R: Negative L: Negative Hip scour (SN 50): R: Not examined L: Not examined  SIJ:  Thigh Thrust (SN 88, -LR 0.18) : R: Not examined L: Not examined  Piriformis Syndrome: FAIR Test (SN 88, SP 83): R: Not examined L: Not examined  Physical Performance Measures  : 13.71s = .72 m/s 30s STS: 14.5 reps Deep squat: Narrow BOS, minor bilateral knee valgus noted  TODAY'S TREATMENT: DATE: 03/27/2024  Subjective: Patient without thigh pain at start of session. Pt reports that she has increased flexibility with the L leg. Patient with continued adherence to HEP. She reports some dizziness when laying down on her R side. No further or concerns   Therapeutic Exercise:   OMEGA Cable Machine:   Seated Knee Extension 3 x 10, 25#  Supine Bridge  Progression  1 x 10 2 x 10, Green TB around thighs.   Supine SLR   L: 1 x 10 AROM, 3 x 10 (PT Resistance applied at the foot )  R Sidelying L Hip Abduction (Straight Leg)   2 x 15, AROM  Therapeutic Activity:   Standing Hip Flexion against resistance   3 x 20, Red TB, Alternating LE (SUE Support)   Sit To Stand with TB around thigh   3 x 10, Red TB  Neuromuscular Re-education (5 Min Unbilled):   R [employer]: + with return to baseline < 30s, dizziness reported, upward torsional nystagmus observed.   Epley Maneuver x 1 in order to treat R Posterior Canalithiasis with improvements in nystagmus response and reported symptoms.      PATIENT EDUCATION:   Education details: HEP, POC, Prognosis  Person educated: Patient Education method: Explanation, Demonstration, and Handouts Education comprehension: verbalized understanding and returned demonstration   HOME EXERCISE PROGRAM:  Access Code: [email or web address] URL: [email or web address] Date: 03/21/2024 Prepared by: Lonni Pall  Exercises - Standing Marching  - 1 x daily - 3-4 x weekly - 2-3 sets - 10-12 reps - 5 hold - Sit to Stand  - 1 x daily - 3-4 x weekly - 2-3 sets - 10 reps - Supine Active Straight Leg Raise  - 1 x daily - 3-4 x weekly - 2-3 sets - 10 reps - Clam with Resistance  - 1 x daily - 3-4 x weekly - 2-3 sets - 10-12 reps - Seated March with Resistance  - 1 x daily - 3-4 x weekly - 2-3 sets - 10-12 reps - Sidelying Hip Abduction  - 1 x daily - 3-4 x weekly - 2-3 sets - 10-12 reps - Supine Bridge  - 1 x daily - 3-4 x weekly - 2-3 sets - 10-12 reps - Lateral Step Down  - 1 x daily - 3-4 x weekly - 2-3 sets - 10 reps - Hip Flexor Stretch on Step  - 1 x daily - 7 x weekly - 3 sets - 30s hold - Forward Step Down Touch with Heel  - 1 x daily - 3-4 x weekly - 2-3 sets - 10 reps  Access Code: [email or web address] URL: [email or web address] Date: 03/06/2024 Prepared by: Lonni Tiaria Biby  Exercises - Hip Flexor Stretch at Edge of Bed (Mirrored)  - 1 x daily - 7 x weekly - 3 sets - 30s hold - Standing Marching  - 1 x daily - 3-4 x weekly - 2-3 sets - 10-12 reps - 5 hold - Sit to Stand  - 1 x daily - 3-4 x weekly - 2-3 sets - 10 reps - Supine Active Straight Leg Raise  - 1 x daily - 3-4 x weekly - 2-3 sets - 10 reps - Seated March  - 1 x daily - 3-4 x weekly - 2-3 sets - 10 reps  ASSESSMENT:  CLINICAL IMPRESSION: Continued PT POC focused on improving L anterior hip pain. Palpation of the L significant for increased muscle tension along proximal hip flexors. emphasized progressive strengthening of the quadriceps, gluteal, and hip musculature through both open and  closed chain movements. Patient tolerated all exercises without exacerbation of symptoms. PT performed dix-hallpike maneuver due to suspected BPPV on the R posterior canal. She presented with + Dix-hallpike; PT performed Epley x 1 trial with improvements in symptoms and dizziness at end of session. She still has intermittent pain in the L thigh limiting her full participation in recreational activities,  driving and prolonged sitting. Based on today's performance, pt will continue to benefit from skilled PT in order to address strength deficits, improve gait mechanics, and progress functional activity tolerance.  OBJECTIVE IMPAIRMENTS: decreased activity tolerance, difficulty walking, decreased strength, and pain.   ACTIVITY LIMITATIONS: squatting and stairs  PARTICIPATION LIMITATIONS: community activity and church  PERSONAL FACTORS: Age, Past/current experiences, Time since onset of injury/illness/exacerbation, and 1-2 comorbidities: Hx of THA (2024), Type II DM are also affecting patient's functional outcome.   REHAB POTENTIAL: Good  CLINICAL DECISION MAKING: Stable/uncomplicated  EVALUATION COMPLEXITY: Low   GOALS: Goals reviewed with patient? No  SHORT TERM GOALS: Target date: 05/08/2024  Pt will be independent with HEP in order to improve strength and decrease hip pain to improve pain-free function at home and work. Baseline: Initial HEP provided. Goal status: INITIAL   LONG TERM GOALS: Target date: 06/19/2024  Pt will decrease 30s STS by at least 5 seconds in order to demonstrate clinically significant improvement in LE strength   Baseline: 02/29/2024: 14.5 reps  Goal status: INITIAL   2.  Pt will decrease worst hip pain by at least 3 points on the NPRS in order to demonstrate clinically significant reduction in hip pain. Baseline: 02/29/2024: 4/10 Goal status: INITIAL  3.  Pt will increase by at least 0.13 m/s in order to demonstrate clinically significant improvement  in community ambulation.        Baseline: 02/29/2024: .72 m/s Goal status: INITIAL  4.  Pt will increase LEFS by at least 9 points in order to demonstrate significant improvement in lower extremity function.      Baseline: 02/29/2024: 58 / 80 = 72.5 % Goal status: INITIAL  5.  Patient will be able to safely navigate a flight of 10 stairs, using proper foot placement and handrail support, without requiring assistance from PT in order to demonstrate significant improvement in LE strength and safety. Baseline: 02/29/2024: deferred  Goal status: Deferred   PLAN: PT FREQUENCY: 1-2x/week  PT DURATION: 12 weeks  PLANNED INTERVENTIONS: Therapeutic exercises, Therapeutic activity, Neuromuscular re-education, Balance training, Gait training, Patient/Family education, Self Care, Joint mobilization, Joint manipulation, Vestibular training, Canalith repositioning, Orthotic/Fit training, DME instructions, Dry Needling, Electrical stimulation, Spinal manipulation, Spinal mobilization, Cryotherapy, Moist heat, Taping, Traction, Ultrasound, Ionotophoresis 4mg /ml Dexamethasone, Manual therapy, and Re-evaluation.  PLAN FOR NEXT SESSION: Progression functional strengthening, Hip flexor stretching, stair navigation,    Lonni Pall PT, DPT Physical Therapist- [facility]  [facility]  03/27/2024, 2:33 PM

## 2024-03-28 ENCOUNTER — Telehealth: Payer: Self-pay

## 2024-03-28 ENCOUNTER — Encounter

## 2024-03-28 NOTE — Telephone Encounter (Signed)
 Patient called asking for new rx for mastectomy bra and prosthesis sent to [facility], fax [phone].  Faxed per MD

## 2024-03-30 ENCOUNTER — Telehealth: Payer: Self-pay | Admitting: *Deleted

## 2024-03-30 ENCOUNTER — Ambulatory Visit: Attending: Orthopaedic Surgery

## 2024-03-30 DIAGNOSIS — R2689 Other abnormalities of gait and mobility: Secondary | ICD-10-CM | POA: Insufficient documentation

## 2024-03-30 DIAGNOSIS — M79605 Pain in left leg: Secondary | ICD-10-CM | POA: Insufficient documentation

## 2024-03-30 DIAGNOSIS — M546 Pain in thoracic spine: Secondary | ICD-10-CM | POA: Diagnosis not present

## 2024-03-30 DIAGNOSIS — M542 Cervicalgia: Secondary | ICD-10-CM | POA: Diagnosis not present

## 2024-03-30 DIAGNOSIS — M6281 Muscle weakness (generalized): Secondary | ICD-10-CM | POA: Diagnosis not present

## 2024-03-30 DIAGNOSIS — M79652 Pain in left thigh: Secondary | ICD-10-CM | POA: Insufficient documentation

## 2024-03-30 DIAGNOSIS — M9903 Segmental and somatic dysfunction of lumbar region: Secondary | ICD-10-CM | POA: Diagnosis not present

## 2024-03-30 DIAGNOSIS — M62830 Muscle spasm of back: Secondary | ICD-10-CM | POA: Diagnosis not present

## 2024-03-30 NOTE — Therapy (Signed)
 OUTPATIENT PHYSICAL THERAPY HIP TREATMENT   Patient Name: Jacqueline Mata MRN: [id number] DOB:13-Sep-1941, 83 y.o., female Today's Date: 03/30/2024  END OF SESSION:  PT End of Session - 03/30/24 1118     Visit Number 8    Number of Visits 25    Date for PT Re-Evaluation 05/23/24    PT Start Time 1116    PT Stop Time 1155    PT Time Calculation (min) 39 min    Activity Tolerance Patient tolerated treatment well;No increased pain    Behavior During Therapy [facility] for tasks assessed/performed            Past Medical History:  Diagnosis Date   Arthritis    breast cancer 2005   bilateral   Cataract 01/04/13 and 03/01/13   Complication of anesthesia    dizziness    Hyperlipidemia    hypothyroidism    Hypothyroidism    Personal history of chemotherapy    Personal history of radiation therapy    PONV (postoperative nausea and vomiting)    Pre-diabetes    Sleep apnea    mild diagnosis no cpap   Tear of left biceps muscle 01/08/2021   Tinnitus    Past Surgical History:  Procedure Laterality Date   bilateral knee replacement s     BREAST BIOPSY Left 2005   positive   BREAST BIOPSY Right 2005   positive   BREAST LUMPECTOMY Left 2005   BREAST LUMPECTOMY Right 2005   gumm surgery      JOINT REPLACEMENT  2013   by Dr. mardee   righ tknee arthroscopy      TONSILLECTOMY     TOTAL HIP ARTHROPLASTY Right 11/12/2020   Procedure: RIGHT TOTAL HIP ARTHROPLASTY ANTERIOR APPROACH;  Surgeon: Sheril Coy, MD;  Location: [facility];  Service: Orthopedics;  Laterality: Right;   TOTAL HIP ARTHROPLASTY Left 11/25/2021   Procedure: LEFT TOTAL HIP ARTHROPLASTY ANTERIOR APPROACH;  Surgeon: Sheril Coy, MD;  Location: [facility];  Service: Orthopedics;  Laterality: Left;   Patient Active Problem List   Diagnosis Date Noted   Pain of left thumb 02/13/2024   Type 2 diabetes mellitus with diabetic chronic kidney disease (HCC) 02/13/2024   Nocturnal leg cramps 11/06/2023   Seasonal allergies  06/20/2023   Sensorineural hearing loss (SNHL) of both ears 10/21/2022   Primary localized osteoarthritis of left hip 11/25/2021   Rosacea 11/10/2021   Chronic hip pain, left 11/10/2021   Chronic pain in left shoulder 05/06/2021   CKD stage 3a, GFR 45-59 ml/min (HCC) 05/06/2021   Mixed stress and urge urinary incontinence 05/06/2021   Pain, dental 02/10/2021   Preoperative evaluation to rule out surgical contraindication 11/04/2020   Primary osteoarthritis of right hip 07/20/2020   Pre-ulcerative corn or callous 07/09/2020   Elevated liver enzymes 04/16/2020   Short-term memory loss 07/31/2019   Educated about COVID-19 virus infection 01/30/2019   Osteopenia determined by x-ray 03/02/2018   Myalgia due to statin 01/02/2018   Stage 2 carcinoma of breast, ER+, unspecified laterality (HCC) 04/29/2017   Onychomycosis of great toe 03/19/2016   Cervical radiculopathy due to degenerative joint disease of spine 03/19/2015   Acromioclavicular joint arthritis 02/19/2015   Rotator cuff dysfunction 01/08/2015   Shoulder pain, bilateral 12/21/2014   Sciatica of left side 10/17/2014   Encounter for Medicare annual wellness exam 05/22/2014   Basal cell carcinoma of leg 12/20/2013   Overweight (BMI 25.0-29.9) 08/08/2013   Statin intolerance 08/07/2013   Balance problem due to  vestibular dysfunction 05/01/2012   Status post total knee replacement 05/01/2012   History of breast cancer 11/09/2011   Acquired hypothyroidism 07/26/2011   Hyperlipidemia associated with type 2 diabetes mellitus (HCC) 07/23/2011    PCP: Marylynn Verneita CROME, MD  REFERRING PROVIDER: Sheril Coy, MD  REFERRING DIAG: [phone] (ICD-10-CM) - Injury of left quadriceps femoris muscle   RATIONALE FOR EVALUATION AND TREATMENT: Rehabilitation  THERAPY DIAG: Pain in left leg  Other abnormalities of gait and mobility  Muscle weakness (generalized)  Pain in left thigh  ONSET DATE: December 22 2023  FOLLOW-UP APPT  SCHEDULED WITH REFERRING PROVIDER: Didn't address   SUBJECTIVE:                                                                                                                                                                                         SUBJECTIVE STATEMENT:  Patient with chief concern of L thigh pain  PERTINENT HISTORY:   Patient is a 83 y.o. female reporting to OPPT with L thigh pain. Patient reports on palm Sunday during a choir performance she was in a static partial forward lunge with a torso rotation (R leg in front). She reports holding that position for a prolonged time and turned toward the choir director and experienced a sharp pain. Following that day she saw her surgeon regarding hip concerns; pt reports that physician cleared her any hip diagnosis or pain stemming from gluteal musculature. Additionally she spoke with her chiropractor; only findings discussed was leg length discrepancy (R shorter than L). She describes her pain as sharp and aching with specific movements and it remains constant. Aggravating factors involve prolonged walking and standing. Pain is alleviated with rest or having the L leg extended and supported on a stool. Patient reports that she has some difficulty with lifting L leg when turning to another direction. Currently she denies numbness, tingling, changes in b/b.   PAIN:    Pain Intensity: Present: 3/10, Best: 2/10, Worst: 4/10 Pain location: L Proximal thigh  Pain Quality: constant, sharp, and aching  Radiating: No  Numbness/Tingling: No Focal Weakness:  Relieving factors: Resting  How long can you sit:  How long can you stand: < 30 min  History of prior back or hip injury, pain, surgery, or therapy: Yes Red flags: Negative for bowel/bladder changes.  PRECAUTIONS: Fall  WEIGHT BEARING RESTRICTIONS: No  FALLS: Has patient fallen in last 6 months? No  Living Environment Lives with: lives alone Lives in: House/apartment Stairs: Yes:  Internal: 13 steps; can reach both and External: 3 steps; can reach both Has following equipment at home: None  Prior level of function: Independent  Occupational demands: Retired   Presenter, broadcasting: Doctor, general practice; Choir Set designer)  Patient Goals: Reduce pain     OBJECTIVE:  Cognition Cognition WNL.     Gross Musculoskeletal Assessment Tremor: None; Bulk: Normal, no muscle wasting noted; Tone: Normal; No erythema, edema, or ecchymosis noted;  GAIT: Distance walked: 56m Assistive device utilized: None Level of assistance: Complete Independence Comments: Slowed velocity, decreased hip ext in LLE  Posture:   AROM AROM (Normal range in degrees) AROM   Lumbar   Flexion (65)   Extension (30)   Right lateral flexion (25)   Left lateral flexion (25)   Right rotation (30)   Left rotation (30)       Hip Right Left  Flexion (125) WNL WNL  Extension (15)    Abduction (40)    Adduction (30)    Internal Rotation (45) WNL WNL  External Rotation (45) WNL WNL      Knee    Flexion (135) WNL WNL  Extension (0) WNL WNL      Ankle    Dorsiflexion (20)    Plantarflexion (50)    Inversion (35)    Eversion (15)    (* = pain; Blank rows = not tested)  LE MMT: MMT (out of 5) Right  Left   Hip flexion 4 3+  Hip extension    Hip abduction  4-  Hip adduction    Hip internal rotation 5 5  Hip external rotation 5 4  Knee flexion 4 4  Knee extension 4 4  Ankle dorsiflexion 5 5  Ankle plantarflexion 5 5  Ankle inversion    Ankle eversion    (* = pain; Blank rows = not tested)  Sensation Grossly intact to light touch throughout bilateral LEs as determined by testing dermatomes L2-S2. Proprioception, stereognosis, and hot/cold testing deferred on this date.  Reflexes R/L Knee Jerk (L3/4): 2+/2+  Ankle Jerk (S1/2): 2+/2+   Muscle Length Hamstrings: R: Negative L: Negative Thomas (hip flexors): R: Negative  L: Positive for Rectus Femoris tightness   Palpation Location Right  Left         Lumbar paraspinals    Quadratus Lumborum    Iliac Crest    ASIS  1  Proximal Rectus Femoris  1      Vastus Lateralis   1  Vastus Medius   1  IT Band   1  Greater Trochanter  1  (Blank rows = not tested) Graded on 0-4 scale (0 = no pain, 1 = pain, 2 = pain with wincing/grimacing/flinching, 3 = pain with withdrawal, 4 = unwilling to allow palpation)  Passive Accessory Intervertebral Motion Deferred   Special Tests  Hip: FABER (SN 81): R: Negative L: Negative FADIR (SN 94): R: Negative L: Negative Hip scour (SN 50): R: Not examined L: Not examined  SIJ:  Thigh Thrust (SN 88, -LR 0.18) : R: Not examined L: Not examined  Piriformis Syndrome: FAIR Test (SN 88, SP 83): R: Not examined L: Not examined  Physical Performance Measures  : 13.71s = .72 m/s 30s STS: 14.5 reps Deep squat: Narrow BOS, minor bilateral knee valgus noted  TODAY'S TREATMENT: DATE: 03/30/2024  Subjective: Patient with 2/10 in the L hip flexor region. She reports that her hip She reports some dizziness when laying down on her R side. No further or concerns   Therapeutic Exercise:   NuStep L5-1 x 5 min x UE/LE (Seat 8) for LE warm up, endurance and strength; PT manually adjusted resistance throughout  bout.    Hip Matrix Cable Machine  Hip Flexion    R/L: 2 x 10, 25#   R/L: 2 x 10, 30#  OMEGA Cable Machine:   Seated Knee Extension 3 x 10, 15#  R Sidelying Clamshell   LLE: 1 x 10, Green TB   2 x 10, Blue TB around thigh   R Sidelying Hip Abduction  LLE: 2 x 10, Mid range in order for improved gluteal activation    Supine SLR against resistance    R/L: 1 x 10 AROM, 2 x 10 5# AW  Therapeutic Activity:   Standing Hip Flexion against resistance   3 x 20, Green TB, Alternating LE (SUE Support)   Retro Step Down from 6 Step (BUE Support)   R/L: 2 x 10 ea   Hip Drop/hike from 6 Step (BUE Support)   R/L: 2 x 10 ea    PATIENT EDUCATION:  Education details: HEP, POC,  Prognosis  Person educated: Patient Education method: Explanation, Demonstration, and Handouts Education comprehension: verbalized understanding and returned demonstration   HOME EXERCISE PROGRAM:  Access Code: [email or web address] URL: [email or web address] Date: 03/21/2024 Prepared by: Lonni Pall  Exercises - Standing Marching  - 1 x daily - 3-4 x weekly - 2-3 sets - 10-12 reps - 5 hold - Sit to Stand  - 1 x daily - 3-4 x weekly - 2-3 sets - 10 reps - Supine Active Straight Leg Raise  - 1 x daily - 3-4 x weekly - 2-3 sets - 10 reps - Clam with Resistance  - 1 x daily - 3-4 x weekly - 2-3 sets - 10-12 reps - Seated March with Resistance  - 1 x daily - 3-4 x weekly - 2-3 sets - 10-12 reps - Sidelying Hip Abduction  - 1 x daily - 3-4 x weekly - 2-3 sets - 10-12 reps - Supine Bridge  - 1 x daily - 3-4 x weekly - 2-3 sets - 10-12 reps - Lateral Step Down  - 1 x daily - 3-4 x weekly - 2-3 sets - 10 reps - Hip Flexor Stretch on Step  - 1 x daily - 7 x weekly - 3 sets - 30s hold - Forward Step Down Touch with Heel  - 1 x daily - 3-4 x weekly - 2-3 sets - 10 reps  Access Code: [email or web address] URL: [email or web address] Date: 03/06/2024 Prepared by: Lonni Jaelin Devincentis  Exercises - Hip Flexor Stretch at Edge of Bed (Mirrored)  - 1 x daily - 7 x weekly - 3 sets - 30s hold - Standing Marching  - 1 x daily - 3-4 x weekly - 2-3 sets - 10-12 reps - 5 hold - Sit to Stand  - 1 x daily - 3-4 x weekly - 2-3 sets - 10 reps - Supine Active Straight Leg Raise  - 1 x daily - 3-4 x weekly - 2-3 sets - 10 reps - Seated March  - 1 x daily - 3-4 x weekly - 2-3 sets - 10 reps  ASSESSMENT:  CLINICAL IMPRESSION: Continued PT POC focused on improving L anterior hip pain. Dizziness improved since last session and she endorses that she was able to transition into R sidelying without symptoms. PT continued to focus on strengthening R hip flexor and quadricep muscles. She continues to tolerate  increased resistance and demonstrates improved hip flexor extensibility. She still has intermittent pain in the L thigh limiting her full participation in recreational activities, driving and prolonged sitting. Based on today's performance,  pt will continue to benefit from skilled PT in order to address strength deficits, improve gait mechanics, and progress functional activity tolerance.  OBJECTIVE IMPAIRMENTS: decreased activity tolerance, difficulty walking, decreased strength, and pain.   ACTIVITY LIMITATIONS: squatting and stairs  PARTICIPATION LIMITATIONS: community activity and church  PERSONAL FACTORS: Age, Past/current experiences, Time since onset of injury/illness/exacerbation, and 1-2 comorbidities: Hx of THA (2024), Type II DM are also affecting patient's functional outcome.   REHAB POTENTIAL: Good  CLINICAL DECISION MAKING: Stable/uncomplicated  EVALUATION COMPLEXITY: Low   GOALS: Goals reviewed with patient? No  SHORT TERM GOALS: Target date: 05/11/2024  Pt will be independent with HEP in order to improve strength and decrease hip pain to improve pain-free function at home and work. Baseline: Initial HEP provided. Goal status: INITIAL   LONG TERM GOALS: Target date: 06/22/2024  Pt will decrease 30s STS by at least 5 seconds in order to demonstrate clinically significant improvement in LE strength   Baseline: 02/29/2024: 14.5 reps  Goal status: INITIAL   2.  Pt will decrease worst hip pain by at least 3 points on the NPRS in order to demonstrate clinically significant reduction in hip pain. Baseline: 02/29/2024: 4/10 Goal status: INITIAL  3.  Pt will increase by at least 0.13 m/s in order to demonstrate clinically significant improvement in community ambulation.        Baseline: 02/29/2024: .72 m/s Goal status: INITIAL  4.  Pt will increase LEFS by at least 9 points in order to demonstrate significant improvement in lower extremity function.      Baseline:  02/29/2024: 58 / 80 = 72.5 % Goal status: INITIAL  5.  Patient will be able to safely navigate a flight of 10 stairs, using proper foot placement and handrail support, without requiring assistance from PT in order to demonstrate significant improvement in LE strength and safety. Baseline: 02/29/2024: deferred  Goal status: Deferred   PLAN: PT FREQUENCY: 1-2x/week  PT DURATION: 12 weeks  PLANNED INTERVENTIONS: Therapeutic exercises, Therapeutic activity, Neuromuscular re-education, Balance training, Gait training, Patient/Family education, Self Care, Joint mobilization, Joint manipulation, Vestibular training, Canalith repositioning, Orthotic/Fit training, DME instructions, Dry Needling, Electrical stimulation, Spinal manipulation, Spinal mobilization, Cryotherapy, Moist heat, Taping, Traction, Ultrasound, Ionotophoresis 4mg /ml Dexamethasone, Manual therapy, and Re-evaluation.  PLAN FOR NEXT SESSION: Progression functional strengthening, Hip flexor stretching, stair navigation,    Lonni Pall PT, DPT Physical Therapist- [facility]  03/30/2024, 11:18 AM

## 2024-03-30 NOTE — Telephone Encounter (Signed)
 Message received from patient requesting that prescription for mastectomy bra and prosthesis be re-faxed to [facility] at Fax# [phone].  Prescription re-faxed per pt.'s request.

## 2024-04-04 ENCOUNTER — Ambulatory Visit

## 2024-04-04 DIAGNOSIS — M79652 Pain in left thigh: Secondary | ICD-10-CM

## 2024-04-04 DIAGNOSIS — M6281 Muscle weakness (generalized): Secondary | ICD-10-CM

## 2024-04-04 DIAGNOSIS — M79605 Pain in left leg: Secondary | ICD-10-CM

## 2024-04-04 DIAGNOSIS — R2689 Other abnormalities of gait and mobility: Secondary | ICD-10-CM

## 2024-04-04 NOTE — Therapy (Signed)
 OUTPATIENT PHYSICAL THERAPY HIP TREATMENT   Patient Name: Jacqueline Mata MRN: [id number] DOB:[birth year], 83 y.o., female Today's Date: 04/04/2024  END OF SESSION:  PT End of Session - 04/04/24 0942     Visit Number 9    Number of Visits 25    Date for PT Re-Evaluation 05/23/24    PT Start Time 0942    PT Stop Time 1025    PT Time Calculation (min) 43 min    Activity Tolerance Patient tolerated treatment well;No increased pain    Behavior During Therapy [facility] for tasks assessed/performed            Past Medical History:  Diagnosis Date   Arthritis    breast cancer 2005   bilateral   Cataract 01/04/13 and 03/01/13   Complication of anesthesia    dizziness    Hyperlipidemia    hypothyroidism    Hypothyroidism    Personal history of chemotherapy    Personal history of radiation therapy    PONV (postoperative nausea and vomiting)    Pre-diabetes    Sleep apnea    mild diagnosis no cpap   Tear of left biceps muscle 01/08/2021   Tinnitus    Past Surgical History:  Procedure Laterality Date   bilateral knee replacement s     BREAST BIOPSY Left 2005   positive   BREAST BIOPSY Right 2005   positive   BREAST LUMPECTOMY Left 2005   BREAST LUMPECTOMY Right 2005   gumm surgery      JOINT REPLACEMENT  2013   by Dr. mardee   righ tknee arthroscopy      TONSILLECTOMY     TOTAL HIP ARTHROPLASTY Right 11/12/2020   Procedure: RIGHT TOTAL HIP ARTHROPLASTY ANTERIOR APPROACH;  Surgeon: Sheril Coy, MD;  Location: [facility];  Service: Orthopedics;  Laterality: Right;   TOTAL HIP ARTHROPLASTY Left 11/25/2021   Procedure: LEFT TOTAL HIP ARTHROPLASTY ANTERIOR APPROACH;  Surgeon: Sheril Coy, MD;  Location: [facility];  Service: Orthopedics;  Laterality: Left;   Patient Active Problem List   Diagnosis Date Noted   Pain of left thumb 02/13/2024   Type 2 diabetes mellitus with diabetic chronic kidney disease (HCC) 02/13/2024   Nocturnal leg cramps 11/06/2023   Seasonal allergies  06/20/2023   Sensorineural hearing loss (SNHL) of both ears 10/21/2022   Primary localized osteoarthritis of left hip 11/25/2021   Rosacea 11/10/2021   Chronic hip pain, left 11/10/2021   Chronic pain in left shoulder 05/06/2021   CKD stage 3a, GFR 45-59 ml/min (HCC) 05/06/2021   Mixed stress and urge urinary incontinence 05/06/2021   Pain, dental 02/10/2021   Preoperative evaluation to rule out surgical contraindication 11/04/2020   Primary osteoarthritis of right hip 07/20/2020   Pre-ulcerative corn or callous 07/09/2020   Elevated liver enzymes 04/16/2020   Short-term memory loss 07/31/2019   Educated about COVID-19 virus infection 01/30/2019   Osteopenia determined by x-ray 03/02/2018   Myalgia due to statin 01/02/2018   Stage 2 carcinoma of breast, ER+, unspecified laterality (HCC) 04/29/2017   Onychomycosis of great toe 03/19/2016   Cervical radiculopathy due to degenerative joint disease of spine 03/19/2015   Acromioclavicular joint arthritis 02/19/2015   Rotator cuff dysfunction 01/08/2015   Shoulder pain, bilateral 12/21/2014   Sciatica of left side 10/17/2014   Encounter for Medicare annual wellness exam 05/22/2014   Basal cell carcinoma of leg 12/20/2013   Overweight (BMI 25.0-29.9) 08/08/2013   Statin intolerance 08/07/2013   Balance problem due to  vestibular dysfunction 05/01/2012   Status post total knee replacement 05/01/2012   History of breast cancer 11/09/2011   Acquired hypothyroidism 07/26/2011   Hyperlipidemia associated with type 2 diabetes mellitus (HCC) 07/23/2011    PCP: Marylynn Verneita CROME, MD  REFERRING PROVIDER: Sheril Coy, MD  REFERRING DIAG: [phone] (ICD-10-CM) - Injury of left quadriceps femoris muscle   RATIONALE FOR EVALUATION AND TREATMENT: Rehabilitation  THERAPY DIAG: Pain in left leg  Other abnormalities of gait and mobility  Muscle weakness (generalized)  Pain in left thigh  ONSET DATE: December 22 2023  FOLLOW-UP APPT  SCHEDULED WITH REFERRING PROVIDER: Didn't address   SUBJECTIVE:                                                                                                                                                                                         SUBJECTIVE STATEMENT:  Patient with chief concern of L thigh pain  PERTINENT HISTORY:   Patient is a 83 y.o. female reporting to OPPT with L thigh pain. Patient reports on palm Sunday during a choir performance she was in a static partial forward lunge with a torso rotation (R leg in front). She reports holding that position for a prolonged time and turned toward the choir director and experienced a sharp pain. Following that day she saw her surgeon regarding hip concerns; pt reports that physician cleared her any hip diagnosis or pain stemming from gluteal musculature. Additionally she spoke with her chiropractor; only findings discussed was leg length discrepancy (R shorter than L). She describes her pain as sharp and aching with specific movements and it remains constant. Aggravating factors involve prolonged walking and standing. Pain is alleviated with rest or having the L leg extended and supported on a stool. Patient reports that she has some difficulty with lifting L leg when turning to another direction. Currently she denies numbness, tingling, changes in b/b.   PAIN:    Pain Intensity: Present: 3/10, Best: 2/10, Worst: 4/10 Pain location: L Proximal thigh  Pain Quality: constant, sharp, and aching  Radiating: No  Numbness/Tingling: No Focal Weakness:  Relieving factors: Resting  How long can you sit:  How long can you stand: < 30 min  History of prior back or hip injury, pain, surgery, or therapy: Yes Red flags: Negative for bowel/bladder changes.  PRECAUTIONS: Fall  WEIGHT BEARING RESTRICTIONS: No  FALLS: Has patient fallen in last 6 months? No  Living Environment Lives with: lives alone Lives in: House/apartment Stairs: Yes:  Internal: 13 steps; can reach both and External: 3 steps; can reach both Has following equipment at home: None  Prior level of function: Independent  Occupational demands: Retired   Presenter, broadcasting: Doctor, general practice; Choir Set designer)  Patient Goals: Reduce pain     OBJECTIVE:  Cognition Cognition WNL.     Gross Musculoskeletal Assessment Tremor: None; Bulk: Normal, no muscle wasting noted; Tone: Normal; No erythema, edema, or ecchymosis noted;  GAIT: Distance walked: 76m Assistive device utilized: None Level of assistance: Complete Independence Comments: Slowed velocity, decreased hip ext in LLE  Posture:   AROM AROM (Normal range in degrees) AROM   Lumbar   Flexion (65)   Extension (30)   Right lateral flexion (25)   Left lateral flexion (25)   Right rotation (30)   Left rotation (30)       Hip Right Left  Flexion (125) WNL WNL  Extension (15)    Abduction (40)    Adduction (30)    Internal Rotation (45) WNL WNL  External Rotation (45) WNL WNL      Knee    Flexion (135) WNL WNL  Extension (0) WNL WNL      Ankle    Dorsiflexion (20)    Plantarflexion (50)    Inversion (35)    Eversion (15)    (* = pain; Blank rows = not tested)  LE MMT: MMT (out of 5) Right  Left   Hip flexion 4 3+  Hip extension    Hip abduction  4-  Hip adduction    Hip internal rotation 5 5  Hip external rotation 5 4  Knee flexion 4 4  Knee extension 4 4  Ankle dorsiflexion 5 5  Ankle plantarflexion 5 5  Ankle inversion    Ankle eversion    (* = pain; Blank rows = not tested)  Sensation Grossly intact to light touch throughout bilateral LEs as determined by testing dermatomes L2-S2. Proprioception, stereognosis, and hot/cold testing deferred on this date.  Reflexes R/L Knee Jerk (L3/4): 2+/2+  Ankle Jerk (S1/2): 2+/2+   Muscle Length Hamstrings: R: Negative L: Negative Thomas (hip flexors): R: Negative  L: Positive for Rectus Femoris tightness   Palpation Location Right  Left         Lumbar paraspinals    Quadratus Lumborum    Iliac Crest    ASIS  1  Proximal Rectus Femoris  1      Vastus Lateralis   1  Vastus Medius   1  IT Band   1  Greater Trochanter  1  (Blank rows = not tested) Graded on 0-4 scale (0 = no pain, 1 = pain, 2 = pain with wincing/grimacing/flinching, 3 = pain with withdrawal, 4 = unwilling to allow palpation)  Passive Accessory Intervertebral Motion Deferred   Special Tests  Hip: FABER (SN 81): R: Negative L: Negative FADIR (SN 94): R: Negative L: Negative Hip scour (SN 50): R: Not examined L: Not examined  SIJ:  Thigh Thrust (SN 88, -LR 0.18) : R: Not examined L: Not examined  Piriformis Syndrome: FAIR Test (SN 88, SP 83): R: Not examined L: Not examined  Physical Performance Measures  : 13.71s = .72 m/s 30s STS: 14.5 reps Deep squat: Narrow BOS, minor bilateral knee valgus noted  TODAY'S TREATMENT: DATE: 04/04/2024  Subjective: Patient reports 1/10 in the proximal anterior hip region; described as an ache. She reports that the pain is located more laterally than last appointment. Pt will trial returning to the [employer] this week in order to increase strength. No further questions or concerns at start of session.    Therapeutic Exercise:  NuStep  L5-1 x 5 min x UE/LE x SPM maintained > 90 (Seat 8) for LE warm up, endurance and strength; PT manually adjusted resistance throughout bout.    Hip Matrix Cable Machine  Hip Flexion    R/L: 1 x 10, 30#, 2 x 10 40#      OMEGA Cable Machine:   Seated Hamstring Curl    1 x 10, 20#   1 x 10, 25#    1 x 10, 30#   Supine Hip Flexion against resistance   R/L: 2 x 10, Red TB around feet    Supine SLR against resistance    R/L: 1 x 10 AROM, 2 x 10 5# AW   R Sidelying Hip Abduction    1 x 10, AROM   2 x 10, against Red TB, straight leg     Standing Hip Flexor Stretch with OH reach   L: 30s/bout x 3 in order to improve pain in L hip flexor and tissue  extensibility   Therapeutic Activity:  PT demo with pt return demo:  Debe Edison Squat  (Seated Rest Break in b/t sets) for optimizing squat technique   3 x 10#  - VC for knee flexion depth   PATIENT EDUCATION:  Education details: HEP, Exercise Technique Person educated: Patient Education method: Explanation, Demonstration, and Handouts Education comprehension: verbalized understanding and returned demonstration   HOME EXERCISE PROGRAM:  Access Code: [email or web address] URL: [email or web address] Date: 03/21/2024 Prepared by: Lonni Pall  Exercises - Standing Marching  - 1 x daily - 3-4 x weekly - 2-3 sets - 10-12 reps - 5 hold - Sit to Stand  - 1 x daily - 3-4 x weekly - 2-3 sets - 10 reps - Supine Active Straight Leg Raise  - 1 x daily - 3-4 x weekly - 2-3 sets - 10 reps - Clam with Resistance  - 1 x daily - 3-4 x weekly - 2-3 sets - 10-12 reps - Seated March with Resistance  - 1 x daily - 3-4 x weekly - 2-3 sets - 10-12 reps - Sidelying Hip Abduction  - 1 x daily - 3-4 x weekly - 2-3 sets - 10-12 reps - Supine Bridge  - 1 x daily - 3-4 x weekly - 2-3 sets - 10-12 reps - Lateral Step Down  - 1 x daily - 3-4 x weekly - 2-3 sets - 10 reps - Hip Flexor Stretch on Step  - 1 x daily - 7 x weekly - 3 sets - 30s hold - Forward Step Down Touch with Heel  - 1 x daily - 3-4 x weekly - 2-3 sets - 10 reps  Access Code: [email or web address] URL: [email or web address] Date: 03/06/2024 Prepared by: Lonni Kienna Moncada  Exercises - Hip Flexor Stretch at Edge of Bed (Mirrored)  - 1 x daily - 7 x weekly - 3 sets - 30s hold - Standing Marching  - 1 x daily - 3-4 x weekly - 2-3 sets - 10-12 reps - 5 hold - Sit to Stand  - 1 x daily - 3-4 x weekly - 2-3 sets - 10 reps - Supine Active Straight Leg Raise  - 1 x daily - 3-4 x weekly - 2-3 sets - 10 reps - Seated March  - 1 x daily - 3-4 x weekly - 2-3 sets - 10 reps  ASSESSMENT:  CLINICAL IMPRESSION: Continued PT POC focused on  improving L anterior hip pain. Pt tolerated increase in resistance with hip flexion based exercises. She continues to make  steady progress towards progressive strengthening and increased ROM. PT will continue focus extensor strength in order to reduce hip flexion dominance. She still presenting with intermittent pain in the L thigh limiting her full participation in prolonged walking. Based on today's performance, pt will continue to benefit from skilled PT in order to address strength deficits, improve gait mechanics, and progress functional activity tolerance.   OBJECTIVE IMPAIRMENTS: decreased activity tolerance, difficulty walking, decreased strength, and pain.   ACTIVITY LIMITATIONS: squatting and stairs  PARTICIPATION LIMITATIONS: community activity and church  PERSONAL FACTORS: Age, Past/current experiences, Time since onset of injury/illness/exacerbation, and 1-2 comorbidities: Hx of THA (2024), Type II DM are also affecting patient's functional outcome.   REHAB POTENTIAL: Good  CLINICAL DECISION MAKING: Stable/uncomplicated  EVALUATION COMPLEXITY: Low   GOALS: Goals reviewed with patient? No  SHORT TERM GOALS: Target date: 05/16/2024  Pt will be independent with HEP in order to improve strength and decrease hip pain to improve pain-free function at home and work. Baseline: Initial HEP provided. Goal status: INITIAL   LONG TERM GOALS: Target date: 06/27/2024  Pt will decrease 30s STS by at least 5 seconds in order to demonstrate clinically significant improvement in LE strength   Baseline: 02/29/2024: 14.5 reps  Goal status: INITIAL   2.  Pt will decrease worst hip pain by at least 3 points on the NPRS in order to demonstrate clinically significant reduction in hip pain. Baseline: 02/29/2024: 4/10 Goal status: INITIAL  3.  Pt will increase by at least 0.13 m/s in order to demonstrate clinically significant improvement in community ambulation.        Baseline:  02/29/2024: .72 m/s Goal status: INITIAL  4.  Pt will increase LEFS by at least 9 points in order to demonstrate significant improvement in lower extremity function.      Baseline: 02/29/2024: 58 / 80 = 72.5 % Goal status: INITIAL  5.  Patient will be able to safely navigate a flight of 10 stairs, using proper foot placement and handrail support, without requiring assistance from PT in order to demonstrate significant improvement in LE strength and safety. Baseline: 02/29/2024: deferred  Goal status: Deferred   PLAN: PT FREQUENCY: 1-2x/week  PT DURATION: 12 weeks  PLANNED INTERVENTIONS: Therapeutic exercises, Therapeutic activity, Neuromuscular re-education, Balance training, Gait training, Patient/Family education, Self Care, Joint mobilization, Joint manipulation, Vestibular training, Canalith repositioning, Orthotic/Fit training, DME instructions, Dry Needling, Electrical stimulation, Spinal manipulation, Spinal mobilization, Cryotherapy, Moist heat, Taping, Traction, Ultrasound, Ionotophoresis 4mg /ml Dexamethasone, Manual therapy, and Re-evaluation.  PLAN FOR NEXT SESSION: Progression functional strengthening, Hip flexor stretching, stair navigation,    Lonni Pall PT, DPT Physical Therapist- [place]  [facility]  04/04/2024, 9:45 AM

## 2024-04-05 DIAGNOSIS — M25552 Pain in left hip: Secondary | ICD-10-CM | POA: Diagnosis not present

## 2024-04-06 ENCOUNTER — Ambulatory Visit

## 2024-04-06 DIAGNOSIS — M79605 Pain in left leg: Secondary | ICD-10-CM | POA: Diagnosis not present

## 2024-04-06 DIAGNOSIS — R2689 Other abnormalities of gait and mobility: Secondary | ICD-10-CM

## 2024-04-06 DIAGNOSIS — M6281 Muscle weakness (generalized): Secondary | ICD-10-CM

## 2024-04-06 DIAGNOSIS — M79652 Pain in left thigh: Secondary | ICD-10-CM

## 2024-04-06 NOTE — Therapy (Signed)
 OUTPATIENT PHYSICAL THERAPY HIP TREATMENT   Patient Name: Jacqueline Mata MRN: [id number] DOB:[birth year], 83 y.o., female Today's Date: 04/06/2024  END OF SESSION:  PT End of Session - 04/06/24 0951     Visit Number 10    Number of Visits 25    Date for PT Re-Evaluation 05/23/24    PT Start Time 0945    PT Stop Time 1025    PT Time Calculation (min) 40 min    Activity Tolerance Patient tolerated treatment well;No increased pain    Behavior During Therapy [facility] for tasks assessed/performed            Past Medical History:  Diagnosis Date   Arthritis    breast cancer 2005   bilateral   Cataract 01/04/13 and 03/01/13   Complication of anesthesia    dizziness    Hyperlipidemia    hypothyroidism    Hypothyroidism    Personal history of chemotherapy    Personal history of radiation therapy    PONV (postoperative nausea and vomiting)    Pre-diabetes    Sleep apnea    mild diagnosis no cpap   Tear of left biceps muscle 01/08/2021   Tinnitus    Past Surgical History:  Procedure Laterality Date   bilateral knee replacement s     BREAST BIOPSY Left 2005   positive   BREAST BIOPSY Right 2005   positive   BREAST LUMPECTOMY Left 2005   BREAST LUMPECTOMY Right 2005   gumm surgery      JOINT REPLACEMENT  2013   by Dr. mardee   righ tknee arthroscopy      TONSILLECTOMY     TOTAL HIP ARTHROPLASTY Right 11/12/2020   Procedure: RIGHT TOTAL HIP ARTHROPLASTY ANTERIOR APPROACH;  Surgeon: Sheril Coy, MD;  Location: [facility];  Service: Orthopedics;  Laterality: Right;   TOTAL HIP ARTHROPLASTY Left 11/25/2021   Procedure: LEFT TOTAL HIP ARTHROPLASTY ANTERIOR APPROACH;  Surgeon: Sheril Coy, MD;  Location: [facility];  Service: Orthopedics;  Laterality: Left;   Patient Active Problem List   Diagnosis Date Noted   Pain of left thumb 02/13/2024   Type 2 diabetes mellitus with diabetic chronic kidney disease (HCC) 02/13/2024   Nocturnal leg cramps 11/06/2023   Seasonal allergies  06/20/2023   Sensorineural hearing loss (SNHL) of both ears 10/21/2022   Primary localized osteoarthritis of left hip 11/25/2021   Rosacea 11/10/2021   Chronic hip pain, left 11/10/2021   Chronic pain in left shoulder 05/06/2021   CKD stage 3a, GFR 45-59 ml/min (HCC) 05/06/2021   Mixed stress and urge urinary incontinence 05/06/2021   Pain, dental 02/10/2021   Preoperative evaluation to rule out surgical contraindication 11/04/2020   Primary osteoarthritis of right hip 07/20/2020   Pre-ulcerative corn or callous 07/09/2020   Elevated liver enzymes 04/16/2020   Short-term memory loss 07/31/2019   Educated about COVID-19 virus infection 01/30/2019   Osteopenia determined by x-ray 03/02/2018   Myalgia due to statin 01/02/2018   Stage 2 carcinoma of breast, ER+, unspecified laterality (HCC) 04/29/2017   Onychomycosis of great toe 03/19/2016   Cervical radiculopathy due to degenerative joint disease of spine 03/19/2015   Acromioclavicular joint arthritis 02/19/2015   Rotator cuff dysfunction 01/08/2015   Shoulder pain, bilateral 12/21/2014   Sciatica of left side 10/17/2014   Encounter for Medicare annual wellness exam 05/22/2014   Basal cell carcinoma of leg 12/20/2013   Overweight (BMI 25.0-29.9) 08/08/2013   Statin intolerance 08/07/2013   Balance problem due to  vestibular dysfunction 05/01/2012   Status post total knee replacement 05/01/2012   History of breast cancer 11/09/2011   Acquired hypothyroidism 07/26/2011   Hyperlipidemia associated with type 2 diabetes mellitus (HCC) 07/23/2011    PCP: Marylynn Verneita CROME, MD  REFERRING PROVIDER: Sheril Coy, MD  REFERRING DIAG: [phone] (ICD-10-CM) - Injury of left quadriceps femoris muscle   RATIONALE FOR EVALUATION AND TREATMENT: Rehabilitation  THERAPY DIAG: Pain in left leg  Other abnormalities of gait and mobility  Muscle weakness (generalized)  Pain in left thigh  ONSET DATE: December 22 2023  FOLLOW-UP APPT  SCHEDULED WITH REFERRING PROVIDER: Didn't address   SUBJECTIVE:                                                                                                                                                                                         SUBJECTIVE STATEMENT:  Patient with chief concern of L thigh pain  PERTINENT HISTORY:   Patient is a 83 y.o. female reporting to OPPT with L thigh pain. Patient reports on palm Sunday during a choir performance she was in a static partial forward lunge with a torso rotation (R leg in front). She reports holding that position for a prolonged time and turned toward the choir director and experienced a sharp pain. Following that day she saw her surgeon regarding hip concerns; pt reports that physician cleared her any hip diagnosis or pain stemming from gluteal musculature. Additionally she spoke with her chiropractor; only findings discussed was leg length discrepancy (R shorter than L). She describes her pain as sharp and aching with specific movements and it remains constant. Aggravating factors involve prolonged walking and standing. Pain is alleviated with rest or having the L leg extended and supported on a stool. Patient reports that she has some difficulty with lifting L leg when turning to another direction. Currently she denies numbness, tingling, changes in b/b.   PAIN:    Pain Intensity: Present: 3/10, Best: 2/10, Worst: 4/10 Pain location: L Proximal thigh  Pain Quality: constant, sharp, and aching  Radiating: No  Numbness/Tingling: No Focal Weakness:  Relieving factors: Resting  How long can you sit:  How long can you stand: < 30 min  History of prior back or hip injury, pain, surgery, or therapy: Yes Red flags: Negative for bowel/bladder changes.  PRECAUTIONS: Fall  WEIGHT BEARING RESTRICTIONS: No  FALLS: Has patient fallen in last 6 months? No  Living Environment Lives with: lives alone Lives in: House/apartment Stairs: Yes:  Internal: 13 steps; can reach both and External: 3 steps; can reach both Has following equipment at home: None  Prior level of function: Independent  Occupational demands: Retired   Presenter, broadcasting: Doctor, general practice; Choir Set designer)  Patient Goals: Reduce pain     OBJECTIVE:  Cognition Cognition WNL.     Gross Musculoskeletal Assessment Tremor: None; Bulk: Normal, no muscle wasting noted; Tone: Normal; No erythema, edema, or ecchymosis noted;  GAIT: Distance walked: 49m Assistive device utilized: None Level of assistance: Complete Independence Comments: Slowed velocity, decreased hip ext in LLE  Posture:   AROM AROM (Normal range in degrees) AROM   Lumbar   Flexion (65)   Extension (30)   Right lateral flexion (25)   Left lateral flexion (25)   Right rotation (30)   Left rotation (30)       Hip Right Left  Flexion (125) WNL WNL  Extension (15)    Abduction (40)    Adduction (30)    Internal Rotation (45) WNL WNL  External Rotation (45) WNL WNL      Knee    Flexion (135) WNL WNL  Extension (0) WNL WNL      Ankle    Dorsiflexion (20)    Plantarflexion (50)    Inversion (35)    Eversion (15)    (* = pain; Blank rows = not tested)  LE MMT: MMT (out of 5) Right  Left   Hip flexion 4 3+  Hip extension    Hip abduction  4-  Hip adduction    Hip internal rotation 5 5  Hip external rotation 5 4  Knee flexion 4 4  Knee extension 4 4  Ankle dorsiflexion 5 5  Ankle plantarflexion 5 5  Ankle inversion    Ankle eversion    (* = pain; Blank rows = not tested)  Sensation Grossly intact to light touch throughout bilateral LEs as determined by testing dermatomes L2-S2. Proprioception, stereognosis, and hot/cold testing deferred on this date.  Reflexes R/L Knee Jerk (L3/4): 2+/2+  Ankle Jerk (S1/2): 2+/2+   Muscle Length Hamstrings: R: Negative L: Negative Thomas (hip flexors): R: Negative  L: Positive for Rectus Femoris tightness   Palpation Location Right  Left         Lumbar paraspinals    Quadratus Lumborum    Iliac Crest    ASIS  1  Proximal Rectus Femoris  1      Vastus Lateralis   1  Vastus Medius   1  IT Band   1  Greater Trochanter  1  (Blank rows = not tested) Graded on 0-4 scale (0 = no pain, 1 = pain, 2 = pain with wincing/grimacing/flinching, 3 = pain with withdrawal, 4 = unwilling to allow palpation)  Passive Accessory Intervertebral Motion Deferred   Special Tests  Hip: FABER (SN 81): R: Negative L: Negative FADIR (SN 94): R: Negative L: Negative Hip scour (SN 50): R: Not examined L: Not examined  SIJ:  Thigh Thrust (SN 88, -LR 0.18) : R: Not examined L: Not examined  Piriformis Syndrome: FAIR Test (SN 88, SP 83): R: Not examined L: Not examined  Physical Performance Measures  : 13.71s = .72 m/s 30s STS: 14.5 reps Deep squat: Narrow BOS, minor bilateral knee valgus noted  TODAY'S TREATMENT: DATE: 04/06/2024  Subjective: Patient reports 0/10 pain in the clinic. She reports there is a pain described as an ache around her greater trochanter after long drives. No further questions or concerns at start of session.    Therapeutic Exercise:  NuStep L5-1 x 5 min x UE/LE x SPM maintained > 90 (Seat 8) for LE warm  up, endurance and strength; PT manually adjusted resistance throughout bout.    Hip Matrix Cable Machine  Hip Flexion    R/L: 3 x 12 40#      Supine Bridge with Abduction   3 x 10, Black TB    Supine SLR against resistance    R/L: 3 x 10 agaisnt PT resistance at     Standing Hip Flexor Stretch with OH reach   L: 30s/bout x 3 in order to improve pain in L hip flexor and tissue extensibility   Therapeutic Activity:  30s STS: 15 reps   Sit to Stand from Arm Chair   1 x 15, No UE Support  Kettle Bell Squat  (Seated Rest Break in b/t sets) for optimizing squat technique   1 x 10, 10# KB   2 x 6, 20# KB  - Educated patient on proper breathing technique with squat.   Static Lunge with  RLE on second stair step  1 x 30s   PATIENT EDUCATION:  Education details: HEP, Exercise Technique Person educated: Patient Education method: Explanation, Demonstration, and Handouts Education comprehension: verbalized understanding and returned demonstration   HOME EXERCISE PROGRAM:  Access Code: [email or web address] URL: [email or web address] Date: 03/21/2024 Prepared by: Lonni Pall  Exercises - Standing Marching  - 1 x daily - 3-4 x weekly - 2-3 sets - 10-12 reps - 5 hold - Sit to Stand  - 1 x daily - 3-4 x weekly - 2-3 sets - 10 reps - Supine Active Straight Leg Raise  - 1 x daily - 3-4 x weekly - 2-3 sets - 10 reps - Clam with Resistance  - 1 x daily - 3-4 x weekly - 2-3 sets - 10-12 reps - Seated March with Resistance  - 1 x daily - 3-4 x weekly - 2-3 sets - 10-12 reps - Sidelying Hip Abduction  - 1 x daily - 3-4 x weekly - 2-3 sets - 10-12 reps - Supine Bridge  - 1 x daily - 3-4 x weekly - 2-3 sets - 10-12 reps - Lateral Step Down  - 1 x daily - 3-4 x weekly - 2-3 sets - 10 reps - Hip Flexor Stretch on Step  - 1 x daily - 7 x weekly - 3 sets - 30s hold - Forward Step Down Touch with Heel  - 1 x daily - 3-4 x weekly - 2-3 sets - 10 reps  Access Code: [email or web address] URL: [email or web address] Date: 03/06/2024 Prepared by: Lonni Jalon Blackwelder  Exercises - Hip Flexor Stretch at Edge of Bed (Mirrored)  - 1 x daily - 7 x weekly - 3 sets - 30s hold - Standing Marching  - 1 x daily - 3-4 x weekly - 2-3 sets - 10-12 reps - 5 hold - Sit to Stand  - 1 x daily - 3-4 x weekly - 2-3 sets - 10 reps - Supine Active Straight Leg Raise  - 1 x daily - 3-4 x weekly - 2-3 sets - 10 reps - Seated March  - 1 x daily - 3-4 x weekly - 2-3 sets - 10 reps  ASSESSMENT:  CLINICAL IMPRESSION:  Patient arrived to 10th visit warranting a progress note. She has demonstrated improvements in LE strength, tissue extensibility, pain and functional mobility as indicated with her performance on  30s STS, LEFS and 10WMT (See goals below). Remainder of session PT continued with progressive strengthening of the hip flexor group bilaterally. She continues to tolerate progressive loading to the LLE without report of  additional pain. She was able to maintain a static lunge at the stair step without UE support indicate improvements in LE strength, balance and tissue extensibility. Her pain is notable during long drives and prolonged walking. PT advised of reducing hip flexion angle in car seat in order to reduce hip flexor shortening. She will return to a gym program next week; PT to continue strengthening and monitor progress while she attempts returning to a regular gym schedule.  She still presenting with intermittent pain in the L thigh limiting her full participation in prolonged walking. Based on today's performance, pt will continue to benefit from skilled PT in order to address strength deficits, improve gait mechanics, and progress functional activity tolerance.  OBJECTIVE IMPAIRMENTS: decreased activity tolerance, difficulty walking, decreased strength, and pain.   ACTIVITY LIMITATIONS: squatting and stairs  PARTICIPATION LIMITATIONS: community activity and church  PERSONAL FACTORS: Age, Past/current experiences, Time since onset of injury/illness/exacerbation, and 1-2 comorbidities: Hx of THA (2024), Type II DM are also affecting patient's functional outcome.   REHAB POTENTIAL: Good  CLINICAL DECISION MAKING: Stable/uncomplicated  EVALUATION COMPLEXITY: Low   GOALS: Goals reviewed with patient? No  SHORT TERM GOALS: Target date: 05/18/2024  Pt will be independent with HEP in order to improve strength and decrease hip pain to improve pain-free function at home and work. Baseline: Initial HEP provided. 04/06/2024: Pt able to perform all exercises ind and with verbal cues Goal status: Goal met   LONG TERM GOALS: Target date: 06/29/2024  Pt will decrease 30s STS by at least 5  seconds in order to demonstrate clinically significant improvement in LE strength   Baseline: 02/29/2024: 14.5 reps; 04/06/24: 15 reps  Goal status: Progressing   2.  Pt will decrease worst hip pain by at least 3 points on the NPRS in order to demonstrate clinically significant reduction in hip pain. Baseline: 02/29/2024: 4/10; 03/28/2024: 2/10 NPS Goal status: Progressing  3.  Pt will increase by at least 0.13 m/s in order to demonstrate clinically significant improvement in community ambulation.        Baseline: 02/29/2024: .72 m/s; 04/06/2024: 1.05 m/s  Goal status: Progressing  4.  Pt will increase LEFS by at least 9 points in order to demonstrate significant improvement in lower extremity function.      Baseline: 02/29/2024: 58 / 80 = 72.5 %; 04/06/2024: 70/80 = 87.5%  Goal status: Progressing   5.  Patient will be able to safely navigate a flight of 10 stairs, using proper foot placement and handrail support, without requiring assistance from PT in order to demonstrate significant improvement in LE strength and safety. Baseline: 02/29/2024: deferred  Goal status: Deferred   PLAN: PT FREQUENCY: 1-2x/week  PT DURATION: 12 weeks  PLANNED INTERVENTIONS: Therapeutic exercises, Therapeutic activity, Neuromuscular re-education, Balance training, Gait training, Patient/Family education, Self Care, Joint mobilization, Joint manipulation, Vestibular training, Canalith repositioning, Orthotic/Fit training, DME instructions, Dry Needling, Electrical stimulation, Spinal manipulation, Spinal mobilization, Cryotherapy, Moist heat, Taping, Traction, Ultrasound, Ionotophoresis 4mg /ml Dexamethasone, Manual therapy, and Re-evaluation.  PLAN FOR NEXT SESSION: Progression functional strengthening, Hip flexor stretching, stair navigation,    Lonni Pall PT, DPT Physical Therapist- [place]  [facility]  04/06/2024, 9:52 AM

## 2024-04-11 ENCOUNTER — Ambulatory Visit

## 2024-04-11 DIAGNOSIS — M79605 Pain in left leg: Secondary | ICD-10-CM

## 2024-04-11 DIAGNOSIS — R2689 Other abnormalities of gait and mobility: Secondary | ICD-10-CM

## 2024-04-11 DIAGNOSIS — M6281 Muscle weakness (generalized): Secondary | ICD-10-CM

## 2024-04-11 DIAGNOSIS — M79652 Pain in left thigh: Secondary | ICD-10-CM

## 2024-04-11 NOTE — Therapy (Signed)
 OUTPATIENT PHYSICAL THERAPY HIP TREATMENT   Patient Name: Jacqueline Mata MRN: [id number] DOB:[birth year], 83 y.o., female Today's Date: 04/11/2024  END OF SESSION:  PT End of Session - 04/11/24 0951     Visit Number 11    Number of Visits 25    Date for PT Re-Evaluation 05/23/24    PT Start Time 0950    PT Stop Time 1030    PT Time Calculation (min) 40 min    Activity Tolerance Patient tolerated treatment well;No increased pain    Behavior During Therapy [facility] for tasks assessed/performed            Past Medical History:  Diagnosis Date   Arthritis    breast cancer 2005   bilateral   Cataract 01/04/13 and 03/01/13   Complication of anesthesia    dizziness    Hyperlipidemia    hypothyroidism    Hypothyroidism    Personal history of chemotherapy    Personal history of radiation therapy    PONV (postoperative nausea and vomiting)    Pre-diabetes    Sleep apnea    mild diagnosis no cpap   Tear of left biceps muscle 01/08/2021   Tinnitus    Past Surgical History:  Procedure Laterality Date   bilateral knee replacement s     BREAST BIOPSY Left 2005   positive   BREAST BIOPSY Right 2005   positive   BREAST LUMPECTOMY Left 2005   BREAST LUMPECTOMY Right 2005   gumm surgery      JOINT REPLACEMENT  2013   by Dr. mardee   righ tknee arthroscopy      TONSILLECTOMY     TOTAL HIP ARTHROPLASTY Right 11/12/2020   Procedure: RIGHT TOTAL HIP ARTHROPLASTY ANTERIOR APPROACH;  Surgeon: Sheril Coy, MD;  Location: [facility];  Service: Orthopedics;  Laterality: Right;   TOTAL HIP ARTHROPLASTY Left 11/25/2021   Procedure: LEFT TOTAL HIP ARTHROPLASTY ANTERIOR APPROACH;  Surgeon: Sheril Coy, MD;  Location: [facility];  Service: Orthopedics;  Laterality: Left;   Patient Active Problem List   Diagnosis Date Noted   Pain of left thumb 02/13/2024   Type 2 diabetes mellitus with diabetic chronic kidney disease (HCC) 02/13/2024   Nocturnal leg cramps 11/06/2023   Seasonal allergies  06/20/2023   Sensorineural hearing loss (SNHL) of both ears 10/21/2022   Primary localized osteoarthritis of left hip 11/25/2021   Rosacea 11/10/2021   Chronic hip pain, left 11/10/2021   Chronic pain in left shoulder 05/06/2021   CKD stage 3a, GFR 45-59 ml/min (HCC) 05/06/2021   Mixed stress and urge urinary incontinence 05/06/2021   Pain, dental 02/10/2021   Preoperative evaluation to rule out surgical contraindication 11/04/2020   Primary osteoarthritis of right hip 07/20/2020   Pre-ulcerative corn or callous 07/09/2020   Elevated liver enzymes 04/16/2020   Short-term memory loss 07/31/2019   Educated about COVID-19 virus infection 01/30/2019   Osteopenia determined by x-ray 03/02/2018   Myalgia due to statin 01/02/2018   Stage 2 carcinoma of breast, ER+, unspecified laterality (HCC) 04/29/2017   Onychomycosis of great toe 03/19/2016   Cervical radiculopathy due to degenerative joint disease of spine 03/19/2015   Acromioclavicular joint arthritis 02/19/2015   Rotator cuff dysfunction 01/08/2015   Shoulder pain, bilateral 12/21/2014   Sciatica of left side 10/17/2014   Encounter for Medicare annual wellness exam 05/22/2014   Basal cell carcinoma of leg 12/20/2013   Overweight (BMI 25.0-29.9) 08/08/2013   Statin intolerance 08/07/2013   Balance problem due to  vestibular dysfunction 05/01/2012   Status post total knee replacement 05/01/2012   History of breast cancer 11/09/2011   Acquired hypothyroidism 07/26/2011   Hyperlipidemia associated with type 2 diabetes mellitus (HCC) 07/23/2011    PCP: Marylynn Verneita CROME, MD  REFERRING PROVIDER: Sheril Coy, MD  REFERRING DIAG: [phone] (ICD-10-CM) - Injury of left quadriceps femoris muscle   RATIONALE FOR EVALUATION AND TREATMENT: Rehabilitation  THERAPY DIAG: Pain in left leg  Other abnormalities of gait and mobility  Muscle weakness (generalized)  Pain in left thigh  ONSET DATE: December 22 2023  FOLLOW-UP APPT  SCHEDULED WITH REFERRING PROVIDER: Didn't address   SUBJECTIVE:                                                                                                                                                                                         SUBJECTIVE STATEMENT:  Patient with chief concern of L thigh pain  PERTINENT HISTORY:   Patient is a 83 y.o. female reporting to OPPT with L thigh pain. Patient reports on palm Sunday during a choir performance she was in a static partial forward lunge with a torso rotation (R leg in front). She reports holding that position for a prolonged time and turned toward the choir director and experienced a sharp pain. Following that day she saw her surgeon regarding hip concerns; pt reports that physician cleared her any hip diagnosis or pain stemming from gluteal musculature. Additionally she spoke with her chiropractor; only findings discussed was leg length discrepancy (R shorter than L). She describes her pain as sharp and aching with specific movements and it remains constant. Aggravating factors involve prolonged walking and standing. Pain is alleviated with rest or having the L leg extended and supported on a stool. Patient reports that she has some difficulty with lifting L leg when turning to another direction. Currently she denies numbness, tingling, changes in b/b.   PAIN:    Pain Intensity: Present: 3/10, Best: 2/10, Worst: 4/10 Pain location: L Proximal thigh  Pain Quality: constant, sharp, and aching  Radiating: No  Numbness/Tingling: No Focal Weakness:  Relieving factors: Resting  How long can you sit:  How long can you stand: < 30 min  History of prior back or hip injury, pain, surgery, or therapy: Yes Red flags: Negative for bowel/bladder changes.  PRECAUTIONS: Fall  WEIGHT BEARING RESTRICTIONS: No  FALLS: Has patient fallen in last 6 months? No  Living Environment Lives with: lives alone Lives in: House/apartment Stairs: Yes:  Internal: 13 steps; can reach both and External: 3 steps; can reach both Has following equipment at home: None  Prior level of function: Independent  Occupational demands: Retired   Presenter, broadcasting: Doctor, general practice; Choir Set designer)  Patient Goals: Reduce pain     OBJECTIVE:  Cognition Cognition WNL.     Gross Musculoskeletal Assessment Tremor: None; Bulk: Normal, no muscle wasting noted; Tone: Normal; No erythema, edema, or ecchymosis noted;  GAIT: Distance walked: 35m Assistive device utilized: None Level of assistance: Complete Independence Comments: Slowed velocity, decreased hip ext in LLE  Posture:   AROM AROM (Normal range in degrees) AROM   Lumbar   Flexion (65)   Extension (30)   Right lateral flexion (25)   Left lateral flexion (25)   Right rotation (30)   Left rotation (30)       Hip Right Left  Flexion (125) WNL WNL  Extension (15)    Abduction (40)    Adduction (30)    Internal Rotation (45) WNL WNL  External Rotation (45) WNL WNL      Knee    Flexion (135) WNL WNL  Extension (0) WNL WNL      Ankle    Dorsiflexion (20)    Plantarflexion (50)    Inversion (35)    Eversion (15)    (* = pain; Blank rows = not tested)  LE MMT: MMT (out of 5) Right  Left   Hip flexion 4 3+  Hip extension    Hip abduction  4-  Hip adduction    Hip internal rotation 5 5  Hip external rotation 5 4  Knee flexion 4 4  Knee extension 4 4  Ankle dorsiflexion 5 5  Ankle plantarflexion 5 5  Ankle inversion    Ankle eversion    (* = pain; Blank rows = not tested)  Sensation Grossly intact to light touch throughout bilateral LEs as determined by testing dermatomes L2-S2. Proprioception, stereognosis, and hot/cold testing deferred on this date.  Reflexes R/L Knee Jerk (L3/4): 2+/2+  Ankle Jerk (S1/2): 2+/2+   Muscle Length Hamstrings: R: Negative L: Negative Thomas (hip flexors): R: Negative  L: Positive for Rectus Femoris tightness   Palpation Location Right  Left         Lumbar paraspinals    Quadratus Lumborum    Iliac Crest    ASIS  1  Proximal Rectus Femoris  1      Vastus Lateralis   1  Vastus Medius   1  IT Band   1  Greater Trochanter  1  (Blank rows = not tested) Graded on 0-4 scale (0 = no pain, 1 = pain, 2 = pain with wincing/grimacing/flinching, 3 = pain with withdrawal, 4 = unwilling to allow palpation)  Passive Accessory Intervertebral Motion Deferred   Special Tests  Hip: FABER (SN 81): R: Negative L: Negative FADIR (SN 94): R: Negative L: Negative Hip scour (SN 50): R: Not examined L: Not examined  SIJ:  Thigh Thrust (SN 88, -LR 0.18) : R: Not examined L: Not examined  Piriformis Syndrome: FAIR Test (SN 88, SP 83): R: Not examined L: Not examined  Physical Performance Measures  : 13.71s = .72 m/s 30s STS: 14.5 reps Deep squat: Narrow BOS, minor bilateral knee valgus noted  TODAY'S TREATMENT: DATE: 04/11/2024  Subjective: Patient reports that she went to the gym for the first time since April. Patient reports that she did multiple exercises targeting her LE for strength. No muscle soreness following her session at the gym. No hip No further questions or concerns at start of session.    Therapeutic Exercise:  NuStep L5-1 x 5  min x UE/LE x SPM maintained > 90 (Seat 8) for LE warm up, endurance and strength; PT manually adjusted resistance throughout bout.    Hip Matrix Cable Machine  Hip Flexion    R/L: 3 x 12 40#    OMEGA Cable Machine:   Seated Knee Extension 1 x 10, 20# 1 x 10, 25# 1 x 10, 30#   Seated Hamstring Curl    1 x 10, 20#    2 x 10, 25#       Supine Bridge with Abduction   3 x 10, Black TB    Supine SLR against resistance    R/L: 3 x 10 agaisnt PT resistance at     Standing Hip Flexor Stretch with OH reach   L: 30s/bout x 3 in order to improve pain in L hip flexor and tissue extensibility   Therapeutic Activity:  Kettle Bell Squat 3 x 6, 20#   Sled Push  1 x 25',  70#  1 x 25' 70# and 3# AW donned   Standing Hip Flexion Marches  2 x 20 alternating LE, 3# AW     PATIENT EDUCATION:  Education details: HEP, Exercise Technique Person educated: Patient Education method: Explanation, Demonstration, and Handouts Education comprehension: verbalized understanding and returned demonstration   HOME EXERCISE PROGRAM:  Access Code: [email or web address] URL: [email or web address] Date: 03/21/2024 Prepared by: Lonni Pall  Exercises - Standing Marching  - 1 x daily - 3-4 x weekly - 2-3 sets - 10-12 reps - 5 hold - Sit to Stand  - 1 x daily - 3-4 x weekly - 2-3 sets - 10 reps - Supine Active Straight Leg Raise  - 1 x daily - 3-4 x weekly - 2-3 sets - 10 reps - Clam with Resistance  - 1 x daily - 3-4 x weekly - 2-3 sets - 10-12 reps - Seated March with Resistance  - 1 x daily - 3-4 x weekly - 2-3 sets - 10-12 reps - Sidelying Hip Abduction  - 1 x daily - 3-4 x weekly - 2-3 sets - 10-12 reps - Supine Bridge  - 1 x daily - 3-4 x weekly - 2-3 sets - 10-12 reps - Lateral Step Down  - 1 x daily - 3-4 x weekly - 2-3 sets - 10 reps - Hip Flexor Stretch on Step  - 1 x daily - 7 x weekly - 3 sets - 30s hold - Forward Step Down Touch with Heel  - 1 x daily - 3-4 x weekly - 2-3 sets - 10 reps  Access Code: [email or web address] URL: [email or web address] Date: 03/06/2024 Prepared by: Lonni Thanh Pomerleau  Exercises - Hip Flexor Stretch at Edge of Bed (Mirrored)  - 1 x daily - 7 x weekly - 3 sets - 30s hold - Standing Marching  - 1 x daily - 3-4 x weekly - 2-3 sets - 10-12 reps - 5 hold - Sit to Stand  - 1 x daily - 3-4 x weekly - 2-3 sets - 10 reps - Supine Active Straight Leg Raise  - 1 x daily - 3-4 x weekly - 2-3 sets - 10 reps - Seated March  - 1 x daily - 3-4 x weekly - 2-3 sets - 10 reps  ASSESSMENT:  CLINICAL IMPRESSION: Continued PT POC focused on improving R hip pain and LE strength. She tolerated an increase in intensity with functional tasks.  Sled push, tall marches and kettle bell squats provided to strengthen thighs and hip flexor group. Good demonstration  of SLS balance during tall marches, PT provided SBA for safety. Pt should demonstrate significant strength gains with return to gym program and progressive strengthening in OPPT. PT encouraged proper frequency of gym program in tandem with OPPT schedule in order to reduce overuse. PT to continue strengthening and monitor progress towards goals. She still presenting with intermittent pain in the L thigh limiting her full participation in prolonged walking. Based on today's performance, pt will continue to benefit from skilled PT in order to address strength deficits, improve gait mechanics, and progress functional activity tolerance.  OBJECTIVE IMPAIRMENTS: decreased activity tolerance, difficulty walking, decreased strength, and pain.   ACTIVITY LIMITATIONS: squatting and stairs  PARTICIPATION LIMITATIONS: community activity and church  PERSONAL FACTORS: Age, Past/current experiences, Time since onset of injury/illness/exacerbation, and 1-2 comorbidities: Hx of THA (2024), Type II DM are also affecting patient's functional outcome.   REHAB POTENTIAL: Good  CLINICAL DECISION MAKING: Stable/uncomplicated  EVALUATION COMPLEXITY: Low   GOALS: Goals reviewed with patient? No  SHORT TERM GOALS: Target date: 05/23/2024  Pt will be independent with HEP in order to improve strength and decrease hip pain to improve pain-free function at home and work. Baseline: Initial HEP provided. 04/06/2024: Pt able to perform all exercises ind and with verbal cues Goal status: Goal met   LONG TERM GOALS: Target date: 07/04/2024  Pt will decrease 30s STS by at least 5 seconds in order to demonstrate clinically significant improvement in LE strength   Baseline: 02/29/2024: 14.5 reps; 04/06/24: 15 reps  Goal status: Progressing   2.  Pt will decrease worst hip pain by at least 3 points on the  NPRS in order to demonstrate clinically significant reduction in hip pain. Baseline: 02/29/2024: 4/10; 03/28/2024: 2/10 NPS Goal status: Progressing  3.  Pt will increase by at least 0.13 m/s in order to demonstrate clinically significant improvement in community ambulation.        Baseline: 02/29/2024: .72 m/s; 04/06/2024: 1.05 m/s  Goal status: Progressing  4.  Pt will increase LEFS by at least 9 points in order to demonstrate significant improvement in lower extremity function.      Baseline: 02/29/2024: 58 / 80 = 72.5 %; 04/06/2024: 70/80 = 87.5%  Goal status: Progressing   5.  Patient will be able to safely navigate a flight of 10 stairs, using proper foot placement and handrail support, without requiring assistance from PT in order to demonstrate significant improvement in LE strength and safety. Baseline: 02/29/2024: deferred  Goal status: Deferred   PLAN: PT FREQUENCY: 1-2x/week  PT DURATION: 12 weeks  PLANNED INTERVENTIONS: Therapeutic exercises, Therapeutic activity, Neuromuscular re-education, Balance training, Gait training, Patient/Family education, Self Care, Joint mobilization, Joint manipulation, Vestibular training, Canalith repositioning, Orthotic/Fit training, DME instructions, Dry Needling, Electrical stimulation, Spinal manipulation, Spinal mobilization, Cryotherapy, Moist heat, Taping, Traction, Ultrasound, Ionotophoresis 4mg /ml Dexamethasone, Manual therapy, and Re-evaluation.  PLAN FOR NEXT SESSION: Progression functional strengthening, Hip flexor stretching, stair navigation,    Lonni Pall PT, DPT Physical Therapist- [facility]  04/11/2024, 12:27 PM

## 2024-04-13 ENCOUNTER — Ambulatory Visit

## 2024-04-13 DIAGNOSIS — M79605 Pain in left leg: Secondary | ICD-10-CM

## 2024-04-13 DIAGNOSIS — M6281 Muscle weakness (generalized): Secondary | ICD-10-CM

## 2024-04-13 DIAGNOSIS — M79652 Pain in left thigh: Secondary | ICD-10-CM

## 2024-04-13 DIAGNOSIS — R2689 Other abnormalities of gait and mobility: Secondary | ICD-10-CM

## 2024-04-13 NOTE — Therapy (Signed)
 OUTPATIENT PHYSICAL THERAPY HIP TREATMENT   Patient Name: Jacqueline Mata MRN: [id number] DOB:July 02, 1941, 83 y.o., female Today's Date: 04/13/2024  END OF SESSION:  PT End of Session - 04/13/24 0948     Visit Number 12    Number of Visits 25    Date for PT Re-Evaluation 05/23/24    PT Start Time 0946    PT Stop Time 1025    PT Time Calculation (min) 39 min    Activity Tolerance Patient tolerated treatment well;No increased pain    Behavior During Therapy [facility] for tasks assessed/performed            Past Medical History:  Diagnosis Date   Arthritis    breast cancer 2005   bilateral   Cataract 01/04/13 and 03/01/13   Complication of anesthesia    dizziness    Hyperlipidemia    hypothyroidism    Hypothyroidism    Personal history of chemotherapy    Personal history of radiation therapy    PONV (postoperative nausea and vomiting)    Pre-diabetes    Sleep apnea    mild diagnosis no cpap   Tear of left biceps muscle 01/08/2021   Tinnitus    Past Surgical History:  Procedure Laterality Date   bilateral knee replacement s     BREAST BIOPSY Left 2005   positive   BREAST BIOPSY Right 2005   positive   BREAST LUMPECTOMY Left 2005   BREAST LUMPECTOMY Right 2005   gumm surgery      JOINT REPLACEMENT  2013   by Dr. mardee   righ tknee arthroscopy      TONSILLECTOMY     TOTAL HIP ARTHROPLASTY Right 11/12/2020   Procedure: RIGHT TOTAL HIP ARTHROPLASTY ANTERIOR APPROACH;  Surgeon: Sheril Coy, MD;  Location: [facility];  Service: Orthopedics;  Laterality: Right;   TOTAL HIP ARTHROPLASTY Left 11/25/2021   Procedure: LEFT TOTAL HIP ARTHROPLASTY ANTERIOR APPROACH;  Surgeon: Sheril Coy, MD;  Location: [facility];  Service: Orthopedics;  Laterality: Left;   Patient Active Problem List   Diagnosis Date Noted   Pain of left thumb 02/13/2024   Type 2 diabetes mellitus with diabetic chronic kidney disease (HCC) 02/13/2024   Nocturnal leg cramps 11/06/2023   Seasonal allergies  06/20/2023   Sensorineural hearing loss (SNHL) of both ears 10/21/2022   Primary localized osteoarthritis of left hip 11/25/2021   Rosacea 11/10/2021   Chronic hip pain, left 11/10/2021   Chronic pain in left shoulder 05/06/2021   CKD stage 3a, GFR 45-59 ml/min (HCC) 05/06/2021   Mixed stress and urge urinary incontinence 05/06/2021   Pain, dental 02/10/2021   Preoperative evaluation to rule out surgical contraindication 11/04/2020   Primary osteoarthritis of right hip 07/20/2020   Pre-ulcerative corn or callous 07/09/2020   Elevated liver enzymes 04/16/2020   Short-term memory loss 07/31/2019   Educated about COVID-19 virus infection 01/30/2019   Osteopenia determined by x-ray 03/02/2018   Myalgia due to statin 01/02/2018   Stage 2 carcinoma of breast, ER+, unspecified laterality (HCC) 04/29/2017   Onychomycosis of great toe 03/19/2016   Cervical radiculopathy due to degenerative joint disease of spine 03/19/2015   Acromioclavicular joint arthritis 02/19/2015   Rotator cuff dysfunction 01/08/2015   Shoulder pain, bilateral 12/21/2014   Sciatica of left side 10/17/2014   Encounter for Medicare annual wellness exam 05/22/2014   Basal cell carcinoma of leg 12/20/2013   Overweight (BMI 25.0-29.9) 08/08/2013   Statin intolerance 08/07/2013   Balance problem due to  vestibular dysfunction 05/01/2012   Status post total knee replacement 05/01/2012   History of breast cancer 11/09/2011   Acquired hypothyroidism 07/26/2011   Hyperlipidemia associated with type 2 diabetes mellitus (HCC) 07/23/2011    PCP: Marylynn Verneita CROME, MD  REFERRING PROVIDER: Sheril Coy, MD  REFERRING DIAG: [phone] (ICD-10-CM) - Injury of left quadriceps femoris muscle   RATIONALE FOR EVALUATION AND TREATMENT: Rehabilitation  THERAPY DIAG: Pain in left leg  Other abnormalities of gait and mobility  Muscle weakness (generalized)  Pain in left thigh  ONSET DATE: December 22 2023  FOLLOW-UP APPT  SCHEDULED WITH REFERRING PROVIDER: Didn't address   SUBJECTIVE:                                                                                                                                                                                         SUBJECTIVE STATEMENT:  Patient with chief concern of L thigh pain  PERTINENT HISTORY:   Patient is a 83 y.o. female reporting to OPPT with L thigh pain. Patient reports on palm Sunday during a choir performance she was in a static partial forward lunge with a torso rotation (R leg in front). She reports holding that position for a prolonged time and turned toward the choir director and experienced a sharp pain. Following that day she saw her surgeon regarding hip concerns; pt reports that physician cleared her any hip diagnosis or pain stemming from gluteal musculature. Additionally she spoke with her chiropractor; only findings discussed was leg length discrepancy (R shorter than L). She describes her pain as sharp and aching with specific movements and it remains constant. Aggravating factors involve prolonged walking and standing. Pain is alleviated with rest or having the L leg extended and supported on a stool. Patient reports that she has some difficulty with lifting L leg when turning to another direction. Currently she denies numbness, tingling, changes in b/b.   PAIN:    Pain Intensity: Present: 3/10, Best: 2/10, Worst: 4/10 Pain location: L Proximal thigh  Pain Quality: constant, sharp, and aching  Radiating: No  Numbness/Tingling: No Focal Weakness:  Relieving factors: Resting  How long can you sit:  How long can you stand: < 30 min  History of prior back or hip injury, pain, surgery, or therapy: Yes Red flags: Negative for bowel/bladder changes.  PRECAUTIONS: Fall  WEIGHT BEARING RESTRICTIONS: No  FALLS: Has patient fallen in last 6 months? No  Living Environment Lives with: lives alone Lives in: House/apartment Stairs: Yes:  Internal: 13 steps; can reach both and External: 3 steps; can reach both Has following equipment at home: None  Prior level of function: Independent  Occupational demands: Retired   Presenter, broadcasting: Doctor, general practice; Choir Set designer)  Patient Goals: Reduce pain     OBJECTIVE:  Cognition Cognition WNL.     Gross Musculoskeletal Assessment Tremor: None; Bulk: Normal, no muscle wasting noted; Tone: Normal; No erythema, edema, or ecchymosis noted;  GAIT: Distance walked: 95m Assistive device utilized: None Level of assistance: Complete Independence Comments: Slowed velocity, decreased hip ext in LLE  Posture:   AROM AROM (Normal range in degrees) AROM   Lumbar   Flexion (65)   Extension (30)   Right lateral flexion (25)   Left lateral flexion (25)   Right rotation (30)   Left rotation (30)       Hip Right Left  Flexion (125) WNL WNL  Extension (15)    Abduction (40)    Adduction (30)    Internal Rotation (45) WNL WNL  External Rotation (45) WNL WNL      Knee    Flexion (135) WNL WNL  Extension (0) WNL WNL      Ankle    Dorsiflexion (20)    Plantarflexion (50)    Inversion (35)    Eversion (15)    (* = pain; Blank rows = not tested)  LE MMT: MMT (out of 5) Right  Left   Hip flexion 4 3+  Hip extension    Hip abduction  4-  Hip adduction    Hip internal rotation 5 5  Hip external rotation 5 4  Knee flexion 4 4  Knee extension 4 4  Ankle dorsiflexion 5 5  Ankle plantarflexion 5 5  Ankle inversion    Ankle eversion    (* = pain; Blank rows = not tested)  Sensation Grossly intact to light touch throughout bilateral LEs as determined by testing dermatomes L2-S2. Proprioception, stereognosis, and hot/cold testing deferred on this date.  Reflexes R/L Knee Jerk (L3/4): 2+/2+  Ankle Jerk (S1/2): 2+/2+   Muscle Length Hamstrings: R: Negative L: Negative Thomas (hip flexors): R: Negative  L: Positive for Rectus Femoris tightness   Palpation Location Right  Left         Lumbar paraspinals    Quadratus Lumborum    Iliac Crest    ASIS  1  Proximal Rectus Femoris  1      Vastus Lateralis   1  Vastus Medius   1  IT Band   1  Greater Trochanter  1  (Blank rows = not tested) Graded on 0-4 scale (0 = no pain, 1 = pain, 2 = pain with wincing/grimacing/flinching, 3 = pain with withdrawal, 4 = unwilling to allow palpation)  Passive Accessory Intervertebral Motion Deferred   Special Tests  Hip: FABER (SN 81): R: Negative L: Negative FADIR (SN 94): R: Negative L: Negative Hip scour (SN 50): R: Not examined L: Not examined  SIJ:  Thigh Thrust (SN 88, -LR 0.18) : R: Not examined L: Not examined  Piriformis Syndrome: FAIR Test (SN 88, SP 83): R: Not examined L: Not examined  Physical Performance Measures  : 13.71s = .72 m/s 30s STS: 14.5 reps Deep squat: Narrow BOS, minor bilateral knee valgus noted  TODAY'S TREATMENT: DATE: 04/13/2024  Subjective: Patient reports minor hip pain in the anterior hip flexor. Patient reports that she didn't have enough sleep last night; she felt restless throughout the night. She still had some pain in her anterior hip with doffing her sock. No further questions or concerns at start of session.    Therapeutic Exercise:  NuStep L6-1  x 5 min x UE/LE x SPM maintained > 90 (Seat 8) for LE warm up, endurance and strength; PT manually adjusted resistance throughout bout.    Hip Matrix Cable Machine  Hip Flexion    R/L: 2 x 10 40#, 1 x 10 55#   Hip Abduction    R/L: 3 x 10 25#    Omega Cable Machine   Knee Extension    3 x 10, 20#    Supine SLR against resistance    R/L: 3 x 10 agaisnt PT resistance at foot     2 x 10 AROM within (30-90 Hip Flexion)    Standing Hip Flexor Stretch with OH reach   L: 30s/bout x 3 in order to improve pain in L hip flexor and tissue extensibility   Therapeutic Activity:  Kettle Bell Squat 2 x 10, 20#   Lateral Lunge with Foot supported on short Step Up   2  x 10 ea leg, 10# KB in both UE    PATIENT EDUCATION:  Education details: HEP, Exercise Technique Person educated: Patient Education method: Explanation, Demonstration, and Handouts Education comprehension: verbalized understanding and returned demonstration   HOME EXERCISE PROGRAM:  Access Code: [email or web address] URL: [email or web address] Date: 03/21/2024 Prepared by: Lonni Pall  Exercises - Standing Marching  - 1 x daily - 3-4 x weekly - 2-3 sets - 10-12 reps - 5 hold - Sit to Stand  - 1 x daily - 3-4 x weekly - 2-3 sets - 10 reps - Supine Active Straight Leg Raise  - 1 x daily - 3-4 x weekly - 2-3 sets - 10 reps - Clam with Resistance  - 1 x daily - 3-4 x weekly - 2-3 sets - 10-12 reps - Seated March with Resistance  - 1 x daily - 3-4 x weekly - 2-3 sets - 10-12 reps - Sidelying Hip Abduction  - 1 x daily - 3-4 x weekly - 2-3 sets - 10-12 reps - Supine Bridge  - 1 x daily - 3-4 x weekly - 2-3 sets - 10-12 reps - Lateral Step Down  - 1 x daily - 3-4 x weekly - 2-3 sets - 10 reps - Hip Flexor Stretch on Step  - 1 x daily - 7 x weekly - 3 sets - 30s hold - Forward Step Down Touch with Heel  - 1 x daily - 3-4 x weekly - 2-3 sets - 10 reps  Access Code: [email or web address] URL: [email or web address] Date: 03/06/2024 Prepared by: Lonni Ramello Cordial  Exercises - Hip Flexor Stretch at Edge of Bed (Mirrored)  - 1 x daily - 7 x weekly - 3 sets - 30s hold - Standing Marching  - 1 x daily - 3-4 x weekly - 2-3 sets - 10-12 reps - 5 hold - Sit to Stand  - 1 x daily - 3-4 x weekly - 2-3 sets - 10 reps - Supine Active Straight Leg Raise  - 1 x daily - 3-4 x weekly - 2-3 sets - 10 reps - Seated March  - 1 x daily - 3-4 x weekly - 2-3 sets - 10 reps  ASSESSMENT:  CLINICAL IMPRESSION: Continued PT POC focused on improving R hip pain and LE strength. She tolerated increase in repetitions with kettle bell squats and resistance with cable machines. She continues to demonstrate  progress towards LE strength. PT focused on L hip flexor group with isolated exercises. Good balance with lateral lunge in order to stretch inner thigh muscle. Her pain continues to improve  with progressive strength training and stretching of hip flexor group. PT to continue strengthening and monitor progress towards goals. She still presenting with intermittent pain in the L thigh limiting her full participation in prolonged walking. Based on today's performance, pt will continue to benefit from skilled PT in order to address strength deficits, improve gait mechanics, and progress functional activity tolerance.  OBJECTIVE IMPAIRMENTS: decreased activity tolerance, difficulty walking, decreased strength, and pain.   ACTIVITY LIMITATIONS: squatting and stairs  PARTICIPATION LIMITATIONS: community activity and church  PERSONAL FACTORS: Age, Past/current experiences, Time since onset of injury/illness/exacerbation, and 1-2 comorbidities: Hx of THA (2024), Type II DM are also affecting patient's functional outcome.   REHAB POTENTIAL: Good  CLINICAL DECISION MAKING: Stable/uncomplicated  EVALUATION COMPLEXITY: Low   GOALS: Goals reviewed with patient? No  SHORT TERM GOALS: Target date: 05/25/2024  Pt will be independent with HEP in order to improve strength and decrease hip pain to improve pain-free function at home and work. Baseline: Initial HEP provided. 04/06/2024: Pt able to perform all exercises ind and with verbal cues Goal status: Goal met   LONG TERM GOALS: Target date: 07/06/2024  Pt will decrease 30s STS by at least 5 seconds in order to demonstrate clinically significant improvement in LE strength   Baseline: 02/29/2024: 14.5 reps; 04/06/24: 15 reps  Goal status: Progressing   2.  Pt will decrease worst hip pain by at least 3 points on the NPRS in order to demonstrate clinically significant reduction in hip pain. Baseline: 02/29/2024: 4/10; 03/28/2024: 2/10 NPS Goal status:  Progressing  3.  Pt will increase by at least 0.13 m/s in order to demonstrate clinically significant improvement in community ambulation.        Baseline: 02/29/2024: .72 m/s; 04/06/2024: 1.05 m/s  Goal status: Progressing  4.  Pt will increase LEFS by at least 9 points in order to demonstrate significant improvement in lower extremity function.      Baseline: 02/29/2024: 58 / 80 = 72.5 %; 04/06/2024: 70/80 = 87.5%  Goal status: Progressing   5.  Patient will be able to safely navigate a flight of 10 stairs, using proper foot placement and handrail support, without requiring assistance from PT in order to demonstrate significant improvement in LE strength and safety. Baseline: 02/29/2024: deferred  Goal status: Deferred   PLAN: PT FREQUENCY: 1-2x/week  PT DURATION: 12 weeks  PLANNED INTERVENTIONS: Therapeutic exercises, Therapeutic activity, Neuromuscular re-education, Balance training, Gait training, Patient/Family education, Self Care, Joint mobilization, Joint manipulation, Vestibular training, Canalith repositioning, Orthotic/Fit training, DME instructions, Dry Needling, Electrical stimulation, Spinal manipulation, Spinal mobilization, Cryotherapy, Moist heat, Taping, Traction, Ultrasound, Ionotophoresis 4mg /ml Dexamethasone, Manual therapy, and Re-evaluation.  PLAN FOR NEXT SESSION: Progression functional strengthening, Hip flexor stretching, stair navigation,    Lonni Pall PT, DPT Physical Therapist- [place]  [facility]  04/13/2024, 9:50 AM

## 2024-04-18 ENCOUNTER — Ambulatory Visit

## 2024-04-18 DIAGNOSIS — M79605 Pain in left leg: Secondary | ICD-10-CM | POA: Diagnosis not present

## 2024-04-18 DIAGNOSIS — R2689 Other abnormalities of gait and mobility: Secondary | ICD-10-CM

## 2024-04-18 DIAGNOSIS — M6281 Muscle weakness (generalized): Secondary | ICD-10-CM

## 2024-04-18 NOTE — Therapy (Signed)
 OUTPATIENT PHYSICAL THERAPY HIP TREATMENT   Patient Name: Jacqueline Mata MRN: [id number] DOB:08-19-1941, 83 y.o., female Today's Date: 04/18/2024  END OF SESSION:  PT End of Session - 04/18/24 0937     Visit Number 13    Number of Visits 25    Date for PT Re-Evaluation 05/23/24    Activity Tolerance Patient tolerated treatment well;No increased pain    Behavior During Therapy [facility] for tasks assessed/performed            Past Medical History:  Diagnosis Date   Arthritis    breast cancer 2005   bilateral   Cataract 01/04/13 and 03/01/13   Complication of anesthesia    dizziness    Hyperlipidemia    hypothyroidism    Hypothyroidism    Personal history of chemotherapy    Personal history of radiation therapy    PONV (postoperative nausea and vomiting)    Pre-diabetes    Sleep apnea    mild diagnosis no cpap   Tear of left biceps muscle 01/08/2021   Tinnitus    Past Surgical History:  Procedure Laterality Date   bilateral knee replacement s     BREAST BIOPSY Left 2005   positive   BREAST BIOPSY Right 2005   positive   BREAST LUMPECTOMY Left 2005   BREAST LUMPECTOMY Right 2005   gumm surgery      JOINT REPLACEMENT  2013   by Dr. mardee   righ tknee arthroscopy      TONSILLECTOMY     TOTAL HIP ARTHROPLASTY Right 11/12/2020   Procedure: RIGHT TOTAL HIP ARTHROPLASTY ANTERIOR APPROACH;  Surgeon: Sheril Coy, MD;  Location: [facility];  Service: Orthopedics;  Laterality: Right;   TOTAL HIP ARTHROPLASTY Left 11/25/2021   Procedure: LEFT TOTAL HIP ARTHROPLASTY ANTERIOR APPROACH;  Surgeon: Sheril Coy, MD;  Location: [facility];  Service: Orthopedics;  Laterality: Left;   Patient Active Problem List   Diagnosis Date Noted   Pain of left thumb 02/13/2024   Type 2 diabetes mellitus with diabetic chronic kidney disease (HCC) 02/13/2024   Nocturnal leg cramps 11/06/2023   Seasonal allergies 06/20/2023   Sensorineural hearing loss (SNHL) of both ears 10/21/2022   Primary  localized osteoarthritis of left hip 11/25/2021   Rosacea 11/10/2021   Chronic hip pain, left 11/10/2021   Chronic pain in left shoulder 05/06/2021   CKD stage 3a, GFR 45-59 ml/min (HCC) 05/06/2021   Mixed stress and urge urinary incontinence 05/06/2021   Pain, dental 02/10/2021   Preoperative evaluation to rule out surgical contraindication 11/04/2020   Primary osteoarthritis of right hip 07/20/2020   Pre-ulcerative corn or callous 07/09/2020   Elevated liver enzymes 04/16/2020   Short-term memory loss 07/31/2019   Educated about COVID-19 virus infection 01/30/2019   Osteopenia determined by x-ray 03/02/2018   Myalgia due to statin 01/02/2018   Stage 2 carcinoma of breast, ER+, unspecified laterality (HCC) 04/29/2017   Onychomycosis of great toe 03/19/2016   Cervical radiculopathy due to degenerative joint disease of spine 03/19/2015   Acromioclavicular joint arthritis 02/19/2015   Rotator cuff dysfunction 01/08/2015   Shoulder pain, bilateral 12/21/2014   Sciatica of left side 10/17/2014   Encounter for Medicare annual wellness exam 05/22/2014   Basal cell carcinoma of leg 12/20/2013   Overweight (BMI 25.0-29.9) 08/08/2013   Statin intolerance 08/07/2013   Balance problem due to vestibular dysfunction 05/01/2012   Status post total knee replacement 05/01/2012   History of breast cancer 11/09/2011   Acquired hypothyroidism 07/26/2011  Hyperlipidemia associated with type 2 diabetes mellitus (HCC) 07/23/2011    PCP: Marylynn Verneita CROME, MD  REFERRING PROVIDER: Sheril Coy, MD  REFERRING DIAG: [phone] (ICD-10-CM) - Injury of left quadriceps femoris muscle   RATIONALE FOR EVALUATION AND TREATMENT: Rehabilitation  THERAPY DIAG: Pain in left leg  Other abnormalities of gait and mobility  Muscle weakness (generalized)  ONSET DATE: December 22 2023  FOLLOW-UP APPT SCHEDULED WITH REFERRING PROVIDER: Didn't address   SUBJECTIVE:                                                                                                                                                                                          SUBJECTIVE STATEMENT:  Patient with chief concern of L thigh pain  PERTINENT HISTORY:   Patient is a 83 y.o. female reporting to OPPT with L thigh pain. Patient reports on palm Sunday during a choir performance she was in a static partial forward lunge with a torso rotation (R leg in front). She reports holding that position for a prolonged time and turned toward the choir director and experienced a sharp pain. Following that day she saw her surgeon regarding hip concerns; pt reports that physician cleared her any hip diagnosis or pain stemming from gluteal musculature. Additionally she spoke with her chiropractor; only findings discussed was leg length discrepancy (R shorter than L). She describes her pain as sharp and aching with specific movements and it remains constant. Aggravating factors involve prolonged walking and standing. Pain is alleviated with rest or having the L leg extended and supported on a stool. Patient reports that she has some difficulty with lifting L leg when turning to another direction. Currently she denies numbness, tingling, changes in b/b.   PAIN:    Pain Intensity: Present: 3/10, Best: 2/10, Worst: 4/10 Pain location: L Proximal thigh  Pain Quality: constant, sharp, and aching  Radiating: No  Numbness/Tingling: No Focal Weakness:  Relieving factors: Resting  How long can you sit:  How long can you stand: < 30 min  History of prior back or hip injury, pain, surgery, or therapy: Yes Red flags: Negative for bowel/bladder changes.  PRECAUTIONS: Fall  WEIGHT BEARING RESTRICTIONS: No  FALLS: Has patient fallen in last 6 months? No  Living Environment Lives with: lives alone Lives in: House/apartment Stairs: Yes: Internal: 13 steps; can reach both and External: 3 steps; can reach both Has following equipment at home:  None  Prior level of function: Independent  Occupational demands: Retired   Presenter, broadcasting: Doctor, general practice; Choir Set designer)  Patient Goals: Reduce pain     OBJECTIVE:  Cognition Cognition WNL.     Gross Musculoskeletal  Assessment Tremor: None; Bulk: Normal, no muscle wasting noted; Tone: Normal; No erythema, edema, or ecchymosis noted;  GAIT: Distance walked: 76m Assistive device utilized: None Level of assistance: Complete Independence Comments: Slowed velocity, decreased hip ext in LLE  Posture:   AROM AROM (Normal range in degrees) AROM   Lumbar   Flexion (65)   Extension (30)   Right lateral flexion (25)   Left lateral flexion (25)   Right rotation (30)   Left rotation (30)       Hip Right Left  Flexion (125) WNL WNL  Extension (15)    Abduction (40)    Adduction (30)    Internal Rotation (45) WNL WNL  External Rotation (45) WNL WNL      Knee    Flexion (135) WNL WNL  Extension (0) WNL WNL      Ankle    Dorsiflexion (20)    Plantarflexion (50)    Inversion (35)    Eversion (15)    (* = pain; Blank rows = not tested)  LE MMT: MMT (out of 5) Right  Left   Hip flexion 4 3+  Hip extension    Hip abduction  4-  Hip adduction    Hip internal rotation 5 5  Hip external rotation 5 4  Knee flexion 4 4  Knee extension 4 4  Ankle dorsiflexion 5 5  Ankle plantarflexion 5 5  Ankle inversion    Ankle eversion    (* = pain; Blank rows = not tested)  Sensation Grossly intact to light touch throughout bilateral LEs as determined by testing dermatomes L2-S2. Proprioception, stereognosis, and hot/cold testing deferred on this date.  Reflexes R/L Knee Jerk (L3/4): 2+/2+  Ankle Jerk (S1/2): 2+/2+   Muscle Length Hamstrings: R: Negative L: Negative Thomas (hip flexors): R: Negative  L: Positive for Rectus Femoris tightness   Palpation Location Right Left         Lumbar paraspinals    Quadratus Lumborum    Iliac Crest    ASIS  1  Proximal Rectus  Femoris  1      Vastus Lateralis   1  Vastus Medius   1  IT Band   1  Greater Trochanter  1  (Blank rows = not tested) Graded on 0-4 scale (0 = no pain, 1 = pain, 2 = pain with wincing/grimacing/flinching, 3 = pain with withdrawal, 4 = unwilling to allow palpation)  Passive Accessory Intervertebral Motion Deferred   Special Tests  Hip: FABER (SN 81): R: Negative L: Negative FADIR (SN 94): R: Negative L: Negative Hip scour (SN 50): R: Not examined L: Not examined  SIJ:  Thigh Thrust (SN 88, -LR 0.18) : R: Not examined L: Not examined  Piriformis Syndrome: FAIR Test (SN 88, SP 83): R: Not examined L: Not examined  Physical Performance Measures  : 13.71s = .72 m/s 30s STS: 14.5 reps Deep squat: Narrow BOS, minor bilateral knee valgus noted  TODAY'S TREATMENT: DATE: 04/18/2024  Subjective: Patient without any pain prior to PT session. Patient reports some neuropathic pain in the L anterior lower leg. Patient recalls little pain in the L hip this past weekend. No further questions or concerns at start of session.    Therapeutic Exercise:  NuStep L6-2x 5 min x UE/LE x SPM maintained > 90 (Seat 8) for LE warm up, endurance and strength; PT manually adjusted resistance throughout bout.    Hip Matrix Cable Machine  Hip Flexion    R/L:  1 x 10, 40#; 2 x 10 55# *  Supine SLR against resistance    L: 1 x 10, AROM    R/L: 2 x 10, 3# AW    Supine Bridge with LE extended on heels    2 x 10, VC for glute set    Sidelying Hip Abduction   L: 2 x 10, 3# AW     Standing Hip Flexor Stretch with OH reach   L: 30s/bout x 4 in order to improve pain in L hip flexor and tissue extensibility   - Educated patient on adding R lateral trunk lean in order to elicits more stretch, good return demonstration and balance   Therapeutic Activity:  Kettle Bell Squat 2 x 10, 20#   - verbal cue for proper breathing technique  - minor nausea endorsed following exercise   Blood Pressure  Assessed:   120/70, Left Forearm, Seated edge of mat table   Lateral Lunge with Foot supported on short Step Up   2 x 10 ea leg, 10# KB in both UE    PATIENT EDUCATION:  Education details: HEP, Exercise Technique Person educated: Patient Education method: Explanation, Demonstration, and Handouts Education comprehension: verbalized understanding and returned demonstration   HOME EXERCISE PROGRAM:  Access Code: [email or web address] URL: [email or web address] Date: 03/21/2024 Prepared by: Lonni Pall  Exercises - Standing Marching  - 1 x daily - 3-4 x weekly - 2-3 sets - 10-12 reps - 5 hold - Sit to Stand  - 1 x daily - 3-4 x weekly - 2-3 sets - 10 reps - Supine Active Straight Leg Raise  - 1 x daily - 3-4 x weekly - 2-3 sets - 10 reps - Clam with Resistance  - 1 x daily - 3-4 x weekly - 2-3 sets - 10-12 reps - Seated March with Resistance  - 1 x daily - 3-4 x weekly - 2-3 sets - 10-12 reps - Sidelying Hip Abduction  - 1 x daily - 3-4 x weekly - 2-3 sets - 10-12 reps - Supine Bridge  - 1 x daily - 3-4 x weekly - 2-3 sets - 10-12 reps - Lateral Step Down  - 1 x daily - 3-4 x weekly - 2-3 sets - 10 reps - Hip Flexor Stretch on Step  - 1 x daily - 7 x weekly - 3 sets - 30s hold - Forward Step Down Touch with Heel  - 1 x daily - 3-4 x weekly - 2-3 sets - 10 reps  Access Code: [email or web address] URL: [email or web address] Date: 03/06/2024 Prepared by: Lonni Denae Zulueta  Exercises - Hip Flexor Stretch at Edge of Bed (Mirrored)  - 1 x daily - 7 x weekly - 3 sets - 30s hold - Standing Marching  - 1 x daily - 3-4 x weekly - 2-3 sets - 10-12 reps - 5 hold - Sit to Stand  - 1 x daily - 3-4 x weekly - 2-3 sets - 10 reps - Supine Active Straight Leg Raise  - 1 x daily - 3-4 x weekly - 2-3 sets - 10 reps - Seated March  - 1 x daily - 3-4 x weekly - 2-3 sets - 10 reps  ASSESSMENT:  CLINICAL IMPRESSION: Continued PT POC focused on improving R hip pain and LE strength. PT focused on  functional strengthening and focal strengthening to hip flexor group.  Functional movements, including kettle bell squats and lateral lunges, were performed with appropriate cueing for posture and breathing mechanics. Patient experienced minor nausea post-squats  but vital signs remained stable and within normal limits (BP: 120/70). She still presenting with intermittent minor in the L thigh limiting her full participation in prolonged walking or standing at church. Based on today's performance, pt will continue to benefit from skilled PT in order to address strength deficits, improve gait mechanics, and progress functional activity tolerance.  OBJECTIVE IMPAIRMENTS: decreased activity tolerance, difficulty walking, decreased strength, and pain.   ACTIVITY LIMITATIONS: squatting and stairs  PARTICIPATION LIMITATIONS: community activity and church  PERSONAL FACTORS: Age, Past/current experiences, Time since onset of injury/illness/exacerbation, and 1-2 comorbidities: Hx of THA (2024), Type II DM are also affecting patient's functional outcome.   REHAB POTENTIAL: Good  CLINICAL DECISION MAKING: Stable/uncomplicated  EVALUATION COMPLEXITY: Low   GOALS: Goals reviewed with patient? No  SHORT TERM GOALS: Target date: 05/30/2024  Pt will be independent with HEP in order to improve strength and decrease hip pain to improve pain-free function at home and work. Baseline: Initial HEP provided. 04/06/2024: Pt able to perform all exercises ind and with verbal cues Goal status: Goal met   LONG TERM GOALS: Target date: 07/11/2024  Pt will decrease 30s STS by at least 5 seconds in order to demonstrate clinically significant improvement in LE strength   Baseline: 02/29/2024: 14.5 reps; 04/06/24: 15 reps  Goal status: Progressing   2.  Pt will decrease worst hip pain by at least 3 points on the NPRS in order to demonstrate clinically significant reduction in hip pain. Baseline: 02/29/2024: 4/10;  03/28/2024: 2/10 NPS Goal status: Progressing  3.  Pt will increase by at least 0.13 m/s in order to demonstrate clinically significant improvement in community ambulation.        Baseline: 02/29/2024: .72 m/s; 04/06/2024: 1.05 m/s  Goal status: Progressing  4.  Pt will increase LEFS by at least 9 points in order to demonstrate significant improvement in lower extremity function.      Baseline: 02/29/2024: 58 / 80 = 72.5 %; 04/06/2024: 70/80 = 87.5%  Goal status: Progressing   5.  Patient will be able to safely navigate a flight of 10 stairs, using proper foot placement and handrail support, without requiring assistance from PT in order to demonstrate significant improvement in LE strength and safety. Baseline: 02/29/2024: deferred  Goal status: Deferred   PLAN: PT FREQUENCY: 1-2x/week  PT DURATION: 12 weeks  PLANNED INTERVENTIONS: Therapeutic exercises, Therapeutic activity, Neuromuscular re-education, Balance training, Gait training, Patient/Family education, Self Care, Joint mobilization, Joint manipulation, Vestibular training, Canalith repositioning, Orthotic/Fit training, DME instructions, Dry Needling, Electrical stimulation, Spinal manipulation, Spinal mobilization, Cryotherapy, Moist heat, Taping, Traction, Ultrasound, Ionotophoresis 4mg /ml Dexamethasone, Manual therapy, and Re-evaluation.  PLAN FOR NEXT SESSION: Progression functional strengthening, Hip flexor stretching   Lonni Pall PT, DPT Physical Therapist- [facility]  [facility]  04/18/2024, 9:38 AM

## 2024-04-20 ENCOUNTER — Ambulatory Visit

## 2024-04-20 DIAGNOSIS — M79605 Pain in left leg: Secondary | ICD-10-CM | POA: Diagnosis not present

## 2024-04-20 DIAGNOSIS — R2689 Other abnormalities of gait and mobility: Secondary | ICD-10-CM

## 2024-04-20 DIAGNOSIS — M6281 Muscle weakness (generalized): Secondary | ICD-10-CM

## 2024-04-20 NOTE — Therapy (Signed)
 OUTPATIENT PHYSICAL THERAPY HIP TREATMENT   Patient Name: Jacqueline Mata MRN: [id number] DOB:1941/01/21, 83 y.o., female Today's Date: 04/20/2024  END OF SESSION:  PT End of Session - 04/20/24 0949     Visit Number 14    Number of Visits 25    Date for PT Re-Evaluation 05/23/24    PT Start Time 0946    PT Stop Time 1025    PT Time Calculation (min) 39 min    Activity Tolerance Patient tolerated treatment well;No increased pain    Behavior During Therapy [facility] for tasks assessed/performed            Past Medical History:  Diagnosis Date   Arthritis    breast cancer 2005   bilateral   Cataract 01/04/13 and 03/01/13   Complication of anesthesia    dizziness    Hyperlipidemia    hypothyroidism    Hypothyroidism    Personal history of chemotherapy    Personal history of radiation therapy    PONV (postoperative nausea and vomiting)    Pre-diabetes    Sleep apnea    mild diagnosis no cpap   Tear of left biceps muscle 01/08/2021   Tinnitus    Past Surgical History:  Procedure Laterality Date   bilateral knee replacement s     BREAST BIOPSY Left 2005   positive   BREAST BIOPSY Right 2005   positive   BREAST LUMPECTOMY Left 2005   BREAST LUMPECTOMY Right 2005   gumm surgery      JOINT REPLACEMENT  2013   by Dr. mardee   righ tknee arthroscopy      TONSILLECTOMY     TOTAL HIP ARTHROPLASTY Right 11/12/2020   Procedure: RIGHT TOTAL HIP ARTHROPLASTY ANTERIOR APPROACH;  Surgeon: Sheril Coy, MD;  Location: [facility];  Service: Orthopedics;  Laterality: Right;   TOTAL HIP ARTHROPLASTY Left 11/25/2021   Procedure: LEFT TOTAL HIP ARTHROPLASTY ANTERIOR APPROACH;  Surgeon: Sheril Coy, MD;  Location: [facility];  Service: Orthopedics;  Laterality: Left;   Patient Active Problem List   Diagnosis Date Noted   Pain of left thumb 02/13/2024   Type 2 diabetes mellitus with diabetic chronic kidney disease (HCC) 02/13/2024   Nocturnal leg cramps 11/06/2023   Seasonal allergies  06/20/2023   Sensorineural hearing loss (SNHL) of both ears 10/21/2022   Primary localized osteoarthritis of left hip 11/25/2021   Rosacea 11/10/2021   Chronic hip pain, left 11/10/2021   Chronic pain in left shoulder 05/06/2021   CKD stage 3a, GFR 45-59 ml/min (HCC) 05/06/2021   Mixed stress and urge urinary incontinence 05/06/2021   Pain, dental 02/10/2021   Preoperative evaluation to rule out surgical contraindication 11/04/2020   Primary osteoarthritis of right hip 07/20/2020   Pre-ulcerative corn or callous 07/09/2020   Elevated liver enzymes 04/16/2020   Short-term memory loss 07/31/2019   Educated about COVID-19 virus infection 01/30/2019   Osteopenia determined by x-ray 03/02/2018   Myalgia due to statin 01/02/2018   Stage 2 carcinoma of breast, ER+, unspecified laterality (HCC) 04/29/2017   Onychomycosis of great toe 03/19/2016   Cervical radiculopathy due to degenerative joint disease of spine 03/19/2015   Acromioclavicular joint arthritis 02/19/2015   Rotator cuff dysfunction 01/08/2015   Shoulder pain, bilateral 12/21/2014   Sciatica of left side 10/17/2014   Encounter for Medicare annual wellness exam 05/22/2014   Basal cell carcinoma of leg 12/20/2013   Overweight (BMI 25.0-29.9) 08/08/2013   Statin intolerance 08/07/2013   Balance problem due to  vestibular dysfunction 05/01/2012   Status post total knee replacement 05/01/2012   History of breast cancer 11/09/2011   Acquired hypothyroidism 07/26/2011   Hyperlipidemia associated with type 2 diabetes mellitus (HCC) 07/23/2011    PCP: Marylynn Verneita CROME, MD  REFERRING PROVIDER: Sheril Coy, MD  REFERRING DIAG: [phone] (ICD-10-CM) - Injury of left quadriceps femoris muscle   RATIONALE FOR EVALUATION AND TREATMENT: Rehabilitation  THERAPY DIAG: Pain in left leg  Other abnormalities of gait and mobility  Muscle weakness (generalized)  ONSET DATE: December 22 2023  FOLLOW-UP APPT SCHEDULED WITH REFERRING  PROVIDER: Didn't address   SUBJECTIVE:                                                                                                                                                                                         SUBJECTIVE STATEMENT:  Patient with chief concern of L thigh pain  PERTINENT HISTORY:   Patient is a 83 y.o. female reporting to OPPT with L thigh pain. Patient reports on palm Sunday during a choir performance she was in a static partial forward lunge with a torso rotation (R leg in front). She reports holding that position for a prolonged time and turned toward the choir director and experienced a sharp pain. Following that day she saw her surgeon regarding hip concerns; pt reports that physician cleared her any hip diagnosis or pain stemming from gluteal musculature. Additionally she spoke with her chiropractor; only findings discussed was leg length discrepancy (R shorter than L). She describes her pain as sharp and aching with specific movements and it remains constant. Aggravating factors involve prolonged walking and standing. Pain is alleviated with rest or having the L leg extended and supported on a stool. Patient reports that she has some difficulty with lifting L leg when turning to another direction. Currently she denies numbness, tingling, changes in b/b.   PAIN:    Pain Intensity: Present: 3/10, Best: 2/10, Worst: 4/10 Pain location: L Proximal thigh  Pain Quality: constant, sharp, and aching  Radiating: No  Numbness/Tingling: No Focal Weakness:  Relieving factors: Resting  How long can you sit:  How long can you stand: < 30 min  History of prior back or hip injury, pain, surgery, or therapy: Yes Red flags: Negative for bowel/bladder changes.  PRECAUTIONS: Fall  WEIGHT BEARING RESTRICTIONS: No  FALLS: Has patient fallen in last 6 months? No  Living Environment Lives with: lives alone Lives in: House/apartment Stairs: Yes: Internal: 13 steps; can  reach both and External: 3 steps; can reach both Has following equipment at home: None  Prior level of function: Independent  Occupational demands: Retired  Hobbies: Quilting; Choir Set designer)  Patient Goals: Reduce pain     OBJECTIVE:  Cognition Cognition WNL.     Gross Musculoskeletal Assessment Tremor: None; Bulk: Normal, no muscle wasting noted; Tone: Normal; No erythema, edema, or ecchymosis noted;  GAIT: Distance walked: 17m Assistive device utilized: None Level of assistance: Complete Independence Comments: Slowed velocity, decreased hip ext in LLE  Posture:   AROM AROM (Normal range in degrees) AROM   Lumbar   Flexion (65)   Extension (30)   Right lateral flexion (25)   Left lateral flexion (25)   Right rotation (30)   Left rotation (30)       Hip Right Left  Flexion (125) WNL WNL  Extension (15)    Abduction (40)    Adduction (30)    Internal Rotation (45) WNL WNL  External Rotation (45) WNL WNL      Knee    Flexion (135) WNL WNL  Extension (0) WNL WNL      Ankle    Dorsiflexion (20)    Plantarflexion (50)    Inversion (35)    Eversion (15)    (* = pain; Blank rows = not tested)  LE MMT: MMT (out of 5) Right  Left   Hip flexion 4 3+  Hip extension    Hip abduction  4-  Hip adduction    Hip internal rotation 5 5  Hip external rotation 5 4  Knee flexion 4 4  Knee extension 4 4  Ankle dorsiflexion 5 5  Ankle plantarflexion 5 5  Ankle inversion    Ankle eversion    (* = pain; Blank rows = not tested)  Sensation Grossly intact to light touch throughout bilateral LEs as determined by testing dermatomes L2-S2. Proprioception, stereognosis, and hot/cold testing deferred on this date.  Reflexes R/L Knee Jerk (L3/4): 2+/2+  Ankle Jerk (S1/2): 2+/2+   Muscle Length Hamstrings: R: Negative L: Negative Thomas (hip flexors): R: Negative  L: Positive for Rectus Femoris tightness   Palpation Location Right Left         Lumbar  paraspinals    Quadratus Lumborum    Iliac Crest    ASIS  1  Proximal Rectus Femoris  1      Vastus Lateralis   1  Vastus Medius   1  IT Band   1  Greater Trochanter  1  (Blank rows = not tested) Graded on 0-4 scale (0 = no pain, 1 = pain, 2 = pain with wincing/grimacing/flinching, 3 = pain with withdrawal, 4 = unwilling to allow palpation)  Passive Accessory Intervertebral Motion Deferred   Special Tests  Hip: FABER (SN 81): R: Negative L: Negative FADIR (SN 94): R: Negative L: Negative Hip scour (SN 50): R: Not examined L: Not examined  SIJ:  Thigh Thrust (SN 88, -LR 0.18) : R: Not examined L: Not examined  Piriformis Syndrome: FAIR Test (SN 88, SP 83): R: Not examined L: Not examined  Physical Performance Measures  : 13.71s = .72 m/s 30s STS: 14.5 reps Deep squat: Narrow BOS, minor bilateral knee valgus noted  TODAY'S TREATMENT: DATE: 04/20/24  Subjective: Patient reports that her legs felt fatigued following last PT session. She went to the gym yesterday and performed some exercises focused on LE and glutes.  No further questions or concerns at start of session.    Therapeutic Exercise:  NuStep L5-4x 5 min x UE/LE x SPM maintained > 90 (Seat 8) for LE warm up, endurance and  strength; PT manually adjusted resistance throughout bout.    Hip Matrix Cable Machine  Hip Flexion    R/L: 2 x 10, 40#   Hip Abduction   R/L: 2 x 10, 40#   Supine SLR against resistance    L: 1 x 10, AROM    R/L: 2 x 10, 3# AW    Supine Bridge with LE extended on heels    2 x 10, VC for glute set    Seated Hip Flexion against resistance    R/L: 3 x 10, Blue TB around feet        Standing Hip Flexor Stretch with OH reach                     R/L: 30s/bout x 2 in order to improve pain in L hip flexor and tissue extensibility    Therapeutic Activity:   Kettle Bell Squat   2 x 10, 10#    Good technique and proper hip hinge mechanics    TRX Reverse Lunge (SBA)    1 x 8 ea  leg     PATIENT EDUCATION:  Education details: HEP, Exercise Technique Person educated: Patient Education method: Explanation, Demonstration, and Handouts Education comprehension: verbalized understanding and returned demonstration   HOME EXERCISE PROGRAM:  Access Code: [email or web address] URL: [email or web address] Date: 04/20/2024 Prepared by: Lonni Pall  Exercises - Supine Active Straight Leg Raise  - 1 x daily - 3-4 x weekly - 2-3 sets - 10 reps - Clam with Resistance  - 1 x daily - 3-4 x weekly - 2-3 sets - 10-12 reps - Sidelying Hip Abduction  - 1 x daily - 3-4 x weekly - 2-3 sets - 10-12 reps - Supine Bridge  - 1 x daily - 3-4 x weekly - 2-3 sets - 10-12 reps - Lateral Step Down  - 1 x daily - 3-4 x weekly - 2-3 sets - 10 reps - Hip Flexor Stretch on Step  - 1 x daily - 7 x weekly - 3 sets - 30s hold - Forward Step Down Touch with Heel  - 1 x daily - 3-4 x weekly - 2-3 sets - 10 reps - Supine 90/90 Overhead Dumbbell Raise  - 1 x daily - 3-4 x weekly - 2-3 sets - 10 reps - Squat with Resistance at Thighs  - 1 x daily - 3-4 x weekly - 2-3 sets - 10-12 reps  ASSESSMENT:  CLINICAL IMPRESSION: Continued PT POC focused on improving L hip pain and LE strength. Patient seems to be responding well to PT interventions and reintegrating back into a regular gym schedule. Exercises focused on improving gluteal and hip flexor strength. Patient tolerated all interventions without exacerbation of L hip pain. She endorses improvements with hip pain when walking longer distances. She still presenting with intermittent minor in the L thigh limiting her full participation in prolonged walking or standing at church. Based on today's performance, pt will continue to benefit from skilled PT in order to address strength deficits, improve gait mechanics, and progress functional activity tolerance.  OBJECTIVE IMPAIRMENTS: decreased activity tolerance, difficulty walking, decreased strength, and  pain.   ACTIVITY LIMITATIONS: squatting and stairs  PARTICIPATION LIMITATIONS: community activity and church  PERSONAL FACTORS: Age, Past/current experiences, Time since onset of injury/illness/exacerbation, and 1-2 comorbidities: Hx of THA (2024), Type II DM are also affecting patient's functional outcome.   REHAB POTENTIAL: Good  CLINICAL DECISION MAKING: Stable/uncomplicated  EVALUATION COMPLEXITY: Low   GOALS: Goals  reviewed with patient? No  SHORT TERM GOALS: Target date: 06/01/2024  Pt will be independent with HEP in order to improve strength and decrease hip pain to improve pain-free function at home and work. Baseline: Initial HEP provided. 04/06/2024: Pt able to perform all exercises ind and with verbal cues Goal status: Goal met   LONG TERM GOALS: Target date: 07/13/2024  Pt will decrease 30s STS by at least 5 seconds in order to demonstrate clinically significant improvement in LE strength   Baseline: 02/29/2024: 14.5 reps; 04/06/24: 15 reps  Goal status: Progressing   2.  Pt will decrease worst hip pain by at least 3 points on the NPRS in order to demonstrate clinically significant reduction in hip pain. Baseline: 02/29/2024: 4/10; 03/28/2024: 2/10 NPS Goal status: Progressing  3.  Pt will increase by at least 0.13 m/s in order to demonstrate clinically significant improvement in community ambulation.        Baseline: 02/29/2024: .72 m/s; 04/06/2024: 1.05 m/s  Goal status: Progressing  4.  Pt will increase LEFS by at least 9 points in order to demonstrate significant improvement in lower extremity function.      Baseline: 02/29/2024: 58 / 80 = 72.5 %; 04/06/2024: 70/80 = 87.5%  Goal status: Progressing   5.  Patient will be able to safely navigate a flight of 10 stairs, using proper foot placement and handrail support, without requiring assistance from PT in order to demonstrate significant improvement in LE strength and safety. Baseline: 02/29/2024:  deferred  Goal status: Deferred   PLAN: PT FREQUENCY: 1-2x/week  PT DURATION: 12 weeks  PLANNED INTERVENTIONS: Therapeutic exercises, Therapeutic activity, Neuromuscular re-education, Balance training, Gait training, Patient/Family education, Self Care, Joint mobilization, Joint manipulation, Vestibular training, Canalith repositioning, Orthotic/Fit training, DME instructions, Dry Needling, Electrical stimulation, Spinal manipulation, Spinal mobilization, Cryotherapy, Moist heat, Taping, Traction, Ultrasound, Ionotophoresis 4mg /ml Dexamethasone, Manual therapy, and Re-evaluation.  PLAN FOR NEXT SESSION: Progression functional strengthening, Hip flexor stretching   Lonni Pall PT, DPT Physical Therapist- [facility]  [facility]  04/20/2024, 10:24 AM

## 2024-04-25 ENCOUNTER — Ambulatory Visit

## 2024-04-25 DIAGNOSIS — M6281 Muscle weakness (generalized): Secondary | ICD-10-CM

## 2024-04-25 DIAGNOSIS — M79605 Pain in left leg: Secondary | ICD-10-CM | POA: Diagnosis not present

## 2024-04-25 DIAGNOSIS — R2689 Other abnormalities of gait and mobility: Secondary | ICD-10-CM

## 2024-04-25 DIAGNOSIS — M79652 Pain in left thigh: Secondary | ICD-10-CM

## 2024-04-25 NOTE — Therapy (Signed)
 OUTPATIENT PHYSICAL THERAPY HIP TREATMENT   Patient Name: Jacqueline Mata MRN: [id number] DOB:12-27-1940, 83 y.o., female Today's Date: 04/25/2024  END OF SESSION:  PT End of Session - 04/25/24 0949     Visit Number 15    Number of Visits 25    Date for PT Re-Evaluation 05/23/24    PT Start Time 0948    PT Stop Time 1030    PT Time Calculation (min) 42 min    Activity Tolerance Patient tolerated treatment well;No increased pain    Behavior During Therapy [facility] for tasks assessed/performed            Past Medical History:  Diagnosis Date   Arthritis    breast cancer 2005   bilateral   Cataract 01/04/13 and 03/01/13   Complication of anesthesia    dizziness    Hyperlipidemia    hypothyroidism    Hypothyroidism    Personal history of chemotherapy    Personal history of radiation therapy    PONV (postoperative nausea and vomiting)    Pre-diabetes    Sleep apnea    mild diagnosis no cpap   Tear of left biceps muscle 01/08/2021   Tinnitus    Past Surgical History:  Procedure Laterality Date   bilateral knee replacement s     BREAST BIOPSY Left 2005   positive   BREAST BIOPSY Right 2005   positive   BREAST LUMPECTOMY Left 2005   BREAST LUMPECTOMY Right 2005   gumm surgery      JOINT REPLACEMENT  2013   by Dr. mardee   righ tknee arthroscopy      TONSILLECTOMY     TOTAL HIP ARTHROPLASTY Right 11/12/2020   Procedure: RIGHT TOTAL HIP ARTHROPLASTY ANTERIOR APPROACH;  Surgeon: Sheril Coy, MD;  Location: [facility];  Service: Orthopedics;  Laterality: Right;   TOTAL HIP ARTHROPLASTY Left 11/25/2021   Procedure: LEFT TOTAL HIP ARTHROPLASTY ANTERIOR APPROACH;  Surgeon: Sheril Coy, MD;  Location: [facility];  Service: Orthopedics;  Laterality: Left;   Patient Active Problem List   Diagnosis Date Noted   Pain of left thumb 02/13/2024   Type 2 diabetes mellitus with diabetic chronic kidney disease (HCC) 02/13/2024   Nocturnal leg cramps 11/06/2023   Seasonal allergies  06/20/2023   Sensorineural hearing loss (SNHL) of both ears 10/21/2022   Primary localized osteoarthritis of left hip 11/25/2021   Rosacea 11/10/2021   Chronic hip pain, left 11/10/2021   Chronic pain in left shoulder 05/06/2021   CKD stage 3a, GFR 45-59 ml/min (HCC) 05/06/2021   Mixed stress and urge urinary incontinence 05/06/2021   Pain, dental 02/10/2021   Preoperative evaluation to rule out surgical contraindication 11/04/2020   Primary osteoarthritis of right hip 07/20/2020   Pre-ulcerative corn or callous 07/09/2020   Elevated liver enzymes 04/16/2020   Short-term memory loss 07/31/2019   Educated about COVID-19 virus infection 01/30/2019   Osteopenia determined by x-ray 03/02/2018   Myalgia due to statin 01/02/2018   Stage 2 carcinoma of breast, ER+, unspecified laterality (HCC) 04/29/2017   Onychomycosis of great toe 03/19/2016   Cervical radiculopathy due to degenerative joint disease of spine 03/19/2015   Acromioclavicular joint arthritis 02/19/2015   Rotator cuff dysfunction 01/08/2015   Shoulder pain, bilateral 12/21/2014   Sciatica of left side 10/17/2014   Encounter for Medicare annual wellness exam 05/22/2014   Basal cell carcinoma of leg 12/20/2013   Overweight (BMI 25.0-29.9) 08/08/2013   Statin intolerance 08/07/2013   Balance problem due to  vestibular dysfunction 05/01/2012   Status post total knee replacement 05/01/2012   History of breast cancer 11/09/2011   Acquired hypothyroidism 07/26/2011   Hyperlipidemia associated with type 2 diabetes mellitus (HCC) 07/23/2011    PCP: Marylynn Verneita CROME, MD  REFERRING PROVIDER: Sheril Coy, MD  REFERRING DIAG: [phone] (ICD-10-CM) - Injury of left quadriceps femoris muscle   RATIONALE FOR EVALUATION AND TREATMENT: Rehabilitation  THERAPY DIAG: Pain in left thigh  Muscle weakness (generalized)  Other abnormalities of gait and mobility  ONSET DATE: December 22 2023  FOLLOW-UP APPT SCHEDULED WITH REFERRING  PROVIDER: Didn't address   SUBJECTIVE:                                                                                                                                                                                         SUBJECTIVE STATEMENT:  Patient with chief concern of L thigh pain  PERTINENT HISTORY:   Patient is a 83 y.o. female reporting to OPPT with L thigh pain. Patient reports on palm Sunday during a choir performance she was in a static partial forward lunge with a torso rotation (R leg in front). She reports holding that position for a prolonged time and turned toward the choir director and experienced a sharp pain. Following that day she saw her surgeon regarding hip concerns; pt reports that physician cleared her any hip diagnosis or pain stemming from gluteal musculature. Additionally she spoke with her chiropractor; only findings discussed was leg length discrepancy (R shorter than L). She describes her pain as sharp and aching with specific movements and it remains constant. Aggravating factors involve prolonged walking and standing. Pain is alleviated with rest or having the L leg extended and supported on a stool. Patient reports that she has some difficulty with lifting L leg when turning to another direction. Currently she denies numbness, tingling, changes in b/b.   PAIN:    Pain Intensity: Present: 3/10, Best: 2/10, Worst: 4/10 Pain location: L Proximal thigh  Pain Quality: constant, sharp, and aching  Radiating: No  Numbness/Tingling: No Focal Weakness:  Relieving factors: Resting  How long can you sit:  How long can you stand: < 30 min  History of prior back or hip injury, pain, surgery, or therapy: Yes Red flags: Negative for bowel/bladder changes.  PRECAUTIONS: Fall  WEIGHT BEARING RESTRICTIONS: No  FALLS: Has patient fallen in last 6 months? No  Living Environment Lives with: lives alone Lives in: House/apartment Stairs: Yes: Internal: 13 steps; can  reach both and External: 3 steps; can reach both Has following equipment at home: None  Prior level of function: Independent  Occupational demands: Retired  Hobbies: Quilting; Choir Set designer)  Patient Goals: Reduce pain     OBJECTIVE:  Cognition Cognition WNL.     Gross Musculoskeletal Assessment Tremor: None; Bulk: Normal, no muscle wasting noted; Tone: Normal; No erythema, edema, or ecchymosis noted;  GAIT: Distance walked: 83m Assistive device utilized: None Level of assistance: Complete Independence Comments: Slowed velocity, decreased hip ext in LLE  Posture:   AROM AROM (Normal range in degrees) AROM   Lumbar   Flexion (65)   Extension (30)   Right lateral flexion (25)   Left lateral flexion (25)   Right rotation (30)   Left rotation (30)       Hip Right Left  Flexion (125) WNL WNL  Extension (15)    Abduction (40)    Adduction (30)    Internal Rotation (45) WNL WNL  External Rotation (45) WNL WNL      Knee    Flexion (135) WNL WNL  Extension (0) WNL WNL      Ankle    Dorsiflexion (20)    Plantarflexion (50)    Inversion (35)    Eversion (15)    (* = pain; Blank rows = not tested)  LE MMT: MMT (out of 5) Right  Left   Hip flexion 4 3+  Hip extension    Hip abduction  4-  Hip adduction    Hip internal rotation 5 5  Hip external rotation 5 4  Knee flexion 4 4  Knee extension 4 4  Ankle dorsiflexion 5 5  Ankle plantarflexion 5 5  Ankle inversion    Ankle eversion    (* = pain; Blank rows = not tested)  Sensation Grossly intact to light touch throughout bilateral LEs as determined by testing dermatomes L2-S2. Proprioception, stereognosis, and hot/cold testing deferred on this date.  Reflexes R/L Knee Jerk (L3/4): 2+/2+  Ankle Jerk (S1/2): 2+/2+   Muscle Length Hamstrings: R: Negative L: Negative Thomas (hip flexors): R: Negative  L: Positive for Rectus Femoris tightness   Palpation Location Right Left         Lumbar  paraspinals    Quadratus Lumborum    Iliac Crest    ASIS  1  Proximal Rectus Femoris  1      Vastus Lateralis   1  Vastus Medius   1  IT Band   1  Greater Trochanter  1  (Blank rows = not tested) Graded on 0-4 scale (0 = no pain, 1 = pain, 2 = pain with wincing/grimacing/flinching, 3 = pain with withdrawal, 4 = unwilling to allow palpation)  Passive Accessory Intervertebral Motion Deferred   Special Tests  Hip: FABER (SN 81): R: Negative L: Negative FADIR (SN 94): R: Negative L: Negative Hip scour (SN 50): R: Not examined L: Not examined  SIJ:  Thigh Thrust (SN 88, -LR 0.18) : R: Not examined L: Not examined  Piriformis Syndrome: FAIR Test (SN 88, SP 83): R: Not examined L: Not examined  Physical Performance Measures  : 13.71s = .72 m/s 30s STS: 14.5 reps Deep squat: Narrow BOS, minor bilateral knee valgus noted  TODAY'S TREATMENT: DATE: 04/25/24  Subjective: Patient reports significant improvements in L hip flexor pain. She reports a 1/10 NPS in the hip. She went  No further questions or concerns at start of session.    Therapeutic Exercise:  NuStep L5-4x 5 min x UE/LE x SPM maintained > 90 (Seat 8) for LE warm up, endurance and strength; PT manually adjusted resistance throughout bout.  OMEGA Cable Machine:   Seated Knee Extension 1 x 10, 25#  1 x 10, 35# 1 x 10, 30#    Seated Hamstring Curl    3 x 10, 30#   Supine 90/90 Overhead Dumbbell Raise   3 x 10, 3# DB  Supine SLR    L: 3 x 12, AROM        Standing Hip Flexor Stretch with OH reach                     R/L: 30s/bout x 2 in order to improve pain in L hip flexor and tissue extensibility    Therapeutic Activity:   Debe Edison Squat for optimizing squatting technique    1 x 8, 30# KB    TRX Reverse Lunge (SBA)    2 x 8 ea leg    - VC for proper foot placement     - intermittent stepping reaction due to unsteadiness with L foot placement   Sit to Stand for LE strength and transfer  capacity    3 x 10, with 10# plate   Air Squat for optimizing squatting technique   1 x 10   PATIENT EDUCATION:  Education details: HEP, Exercise Technique Person educated: Patient Education method: Explanation, Demonstration, and Handouts Education comprehension: verbalized understanding and returned demonstration   HOME EXERCISE PROGRAM:  Access Code: [email or web address] URL: [email or web address] Date: 04/20/2024 Prepared by: Lonni Pall  Exercises - Supine Active Straight Leg Raise  - 1 x daily - 3-4 x weekly - 2-3 sets - 10 reps - Clam with Resistance  - 1 x daily - 3-4 x weekly - 2-3 sets - 10-12 reps - Sidelying Hip Abduction  - 1 x daily - 3-4 x weekly - 2-3 sets - 10-12 reps - Supine Bridge  - 1 x daily - 3-4 x weekly - 2-3 sets - 10-12 reps - Lateral Step Down  - 1 x daily - 3-4 x weekly - 2-3 sets - 10 reps - Hip Flexor Stretch on Step  - 1 x daily - 7 x weekly - 3 sets - 30s hold - Forward Step Down Touch with Heel  - 1 x daily - 3-4 x weekly - 2-3 sets - 10 reps - Supine 90/90 Overhead Dumbbell Raise  - 1 x daily - 3-4 x weekly - 2-3 sets - 10 reps - Squat with Resistance at Thighs  - 1 x daily - 3-4 x weekly - 2-3 sets - 10-12 reps  ASSESSMENT:  CLINICAL IMPRESSION: Continued PT POC focused on improving L hip pain and LE strength. Her pain has significantly improved with reintegration of gym exercises and PT interventions. PT increased intensity with LE and functional tasks such as sit to stand; good demonstration of improved LE strength and report of exacerbation of pain. Good balance with reverse lunges using UE support. PT will continue to progress strengthening exercises as tolerated. Based on today's performance, pt will continue to benefit from skilled PT in order to address strength deficits, improve gait mechanics, and progress functional activity tolerance.  OBJECTIVE IMPAIRMENTS: decreased activity tolerance, difficulty walking, decreased strength, and  pain.   ACTIVITY LIMITATIONS: squatting and stairs  PARTICIPATION LIMITATIONS: community activity and church  PERSONAL FACTORS: Age, Past/current experiences, Time since onset of injury/illness/exacerbation, and 1-2 comorbidities: Hx of THA (2024), Type II DM are also affecting patient's functional outcome.   REHAB POTENTIAL: Good  CLINICAL DECISION MAKING: Stable/uncomplicated  EVALUATION COMPLEXITY: Low   GOALS: Goals  reviewed with patient? No  SHORT TERM GOALS: Target date: 06/06/2024  Pt will be independent with HEP in order to improve strength and decrease hip pain to improve pain-free function at home and work. Baseline: Initial HEP provided. 04/06/2024: Pt able to perform all exercises ind and with verbal cues Goal status: Goal met   LONG TERM GOALS: Target date: 07/18/2024  Pt will decrease 30s STS by at least 5 seconds in order to demonstrate clinically significant improvement in LE strength   Baseline: 02/29/2024: 14.5 reps; 04/06/24: 15 reps  Goal status: Progressing   2.  Pt will decrease worst hip pain by at least 3 points on the NPRS in order to demonstrate clinically significant reduction in hip pain. Baseline: 02/29/2024: 4/10; 03/28/2024: 2/10 NPS; 04/25/2024: 2/10 Goal status: Progressing  3.  Pt will increase by at least 0.13 m/s in order to demonstrate clinically significant improvement in community ambulation.        Baseline: 02/29/2024: .72 m/s; 04/06/2024: 1.05 m/s  Goal status: Progressing  4.  Pt will increase LEFS by at least 9 points in order to demonstrate significant improvement in lower extremity function.      Baseline: 02/29/2024: 58 / 80 = 72.5 %; 04/06/2024: 70/80 = 87.5%  Goal status: Progressing   5.  Patient will be able to safely navigate a flight of 10 stairs, using proper foot placement and handrail support, without requiring assistance from PT in order to demonstrate significant improvement in LE strength and safety. Baseline:  02/29/2024: deferred  Goal status: Deferred   PLAN: PT FREQUENCY: 1-2x/week  PT DURATION: 12 weeks  PLANNED INTERVENTIONS: Therapeutic exercises, Therapeutic activity, Neuromuscular re-education, Balance training, Gait training, Patient/Family education, Self Care, Joint mobilization, Joint manipulation, Vestibular training, Canalith repositioning, Orthotic/Fit training, DME instructions, Dry Needling, Electrical stimulation, Spinal manipulation, Spinal mobilization, Cryotherapy, Moist heat, Taping, Traction, Ultrasound, Ionotophoresis 4mg /ml Dexamethasone, Manual therapy, and Re-evaluation.  PLAN FOR NEXT SESSION: Progression functional strengthening, Hip flexor stretching   Lonni Pall PT, DPT Physical Therapist- [facility]  [facility]  04/25/2024, 10:22 AM

## 2024-04-27 ENCOUNTER — Ambulatory Visit

## 2024-04-27 DIAGNOSIS — M79652 Pain in left thigh: Secondary | ICD-10-CM

## 2024-04-27 DIAGNOSIS — M6281 Muscle weakness (generalized): Secondary | ICD-10-CM

## 2024-04-27 DIAGNOSIS — R2689 Other abnormalities of gait and mobility: Secondary | ICD-10-CM

## 2024-04-27 DIAGNOSIS — M79605 Pain in left leg: Secondary | ICD-10-CM | POA: Diagnosis not present

## 2024-04-27 NOTE — Therapy (Signed)
 OUTPATIENT PHYSICAL THERAPY HIP TREATMENT   Patient Name: Jacqueline Mata MRN: [id number] DOB:[birth year], 83 y.o., female Today's Date: 04/27/2024  END OF SESSION:  PT End of Session - 04/27/24 0951     Visit Number 16    Number of Visits 25    Date for PT Re-Evaluation 05/23/24    PT Start Time 0946    PT Stop Time 1025    PT Time Calculation (min) 39 min    Activity Tolerance Patient tolerated treatment well;No increased pain    Behavior During Therapy [facility] for tasks assessed/performed            Past Medical History:  Diagnosis Date   Arthritis    breast cancer 2005   bilateral   Cataract 01/04/13 and 03/01/13   Complication of anesthesia    dizziness    Hyperlipidemia    hypothyroidism    Hypothyroidism    Personal history of chemotherapy    Personal history of radiation therapy    PONV (postoperative nausea and vomiting)    Pre-diabetes    Sleep apnea    mild diagnosis no cpap   Tear of left biceps muscle 01/08/2021   Tinnitus    Past Surgical History:  Procedure Laterality Date   bilateral knee replacement s     BREAST BIOPSY Left 2005   positive   BREAST BIOPSY Right 2005   positive   BREAST LUMPECTOMY Left 2005   BREAST LUMPECTOMY Right 2005   gumm surgery      JOINT REPLACEMENT  2013   by Dr. mardee   righ tknee arthroscopy      TONSILLECTOMY     TOTAL HIP ARTHROPLASTY Right 11/12/2020   Procedure: RIGHT TOTAL HIP ARTHROPLASTY ANTERIOR APPROACH;  Surgeon: Sheril Coy, MD;  Location: [facility];  Service: Orthopedics;  Laterality: Right;   TOTAL HIP ARTHROPLASTY Left 11/25/2021   Procedure: LEFT TOTAL HIP ARTHROPLASTY ANTERIOR APPROACH;  Surgeon: Sheril Coy, MD;  Location: [facility];  Service: Orthopedics;  Laterality: Left;   Patient Active Problem List   Diagnosis Date Noted   Pain of left thumb 02/13/2024   Type 2 diabetes mellitus with diabetic chronic kidney disease (HCC) 02/13/2024   Nocturnal leg cramps 11/06/2023   Seasonal allergies  06/20/2023   Sensorineural hearing loss (SNHL) of both ears 10/21/2022   Primary localized osteoarthritis of left hip 11/25/2021   Rosacea 11/10/2021   Chronic hip pain, left 11/10/2021   Chronic pain in left shoulder 05/06/2021   CKD stage 3a, GFR 45-59 ml/min (HCC) 05/06/2021   Mixed stress and urge urinary incontinence 05/06/2021   Pain, dental 02/10/2021   Preoperative evaluation to rule out surgical contraindication 11/04/2020   Primary osteoarthritis of right hip 07/20/2020   Pre-ulcerative corn or callous 07/09/2020   Elevated liver enzymes 04/16/2020   Short-term memory loss 07/31/2019   Educated about COVID-19 virus infection 01/30/2019   Osteopenia determined by x-ray 03/02/2018   Myalgia due to statin 01/02/2018   Stage 2 carcinoma of breast, ER+, unspecified laterality (HCC) 04/29/2017   Onychomycosis of great toe 03/19/2016   Cervical radiculopathy due to degenerative joint disease of spine 03/19/2015   Acromioclavicular joint arthritis 02/19/2015   Rotator cuff dysfunction 01/08/2015   Shoulder pain, bilateral 12/21/2014   Sciatica of left side 10/17/2014   Encounter for Medicare annual wellness exam 05/22/2014   Basal cell carcinoma of leg 12/20/2013   Overweight (BMI 25.0-29.9) 08/08/2013   Statin intolerance 08/07/2013   Balance problem due to  vestibular dysfunction 05/01/2012   Status post total knee replacement 05/01/2012   History of breast cancer 11/09/2011   Acquired hypothyroidism 07/26/2011   Hyperlipidemia associated with type 2 diabetes mellitus (HCC) 07/23/2011    PCP: Marylynn Verneita CROME, MD  REFERRING PROVIDER: Sheril Coy, MD  REFERRING DIAG: [phone] (ICD-10-CM) - Injury of left quadriceps femoris muscle   RATIONALE FOR EVALUATION AND TREATMENT: Rehabilitation  THERAPY DIAG: Pain in left thigh  Muscle weakness (generalized)  Other abnormalities of gait and mobility  ONSET DATE: December 22 2023  FOLLOW-UP APPT SCHEDULED WITH REFERRING  PROVIDER: Didn't address   SUBJECTIVE:                                                                                                                                                                                         SUBJECTIVE STATEMENT:  Patient with chief concern of L thigh pain  PERTINENT HISTORY:   Patient is a 83 y.o. female reporting to OPPT with L thigh pain. Patient reports on palm Sunday during a choir performance she was in a static partial forward lunge with a torso rotation (R leg in front). She reports holding that position for a prolonged time and turned toward the choir director and experienced a sharp pain. Following that day she saw her surgeon regarding hip concerns; pt reports that physician cleared her any hip diagnosis or pain stemming from gluteal musculature. Additionally she spoke with her chiropractor; only findings discussed was leg length discrepancy (R shorter than L). She describes her pain as sharp and aching with specific movements and it remains constant. Aggravating factors involve prolonged walking and standing. Pain is alleviated with rest or having the L leg extended and supported on a stool. Patient reports that she has some difficulty with lifting L leg when turning to another direction. Currently she denies numbness, tingling, changes in b/b.   PAIN:    Pain Intensity: Present: 3/10, Best: 2/10, Worst: 4/10 Pain location: L Proximal thigh  Pain Quality: constant, sharp, and aching  Radiating: No  Numbness/Tingling: No Focal Weakness:  Relieving factors: Resting  How long can you sit:  How long can you stand: < 30 min  History of prior back or hip injury, pain, surgery, or therapy: Yes Red flags: Negative for bowel/bladder changes.  PRECAUTIONS: Fall  WEIGHT BEARING RESTRICTIONS: No  FALLS: Has patient fallen in last 6 months? No  Living Environment Lives with: lives alone Lives in: House/apartment Stairs: Yes: Internal: 13 steps; can  reach both and External: 3 steps; can reach both Has following equipment at home: None  Prior level of function: Independent  Occupational demands: Retired  Hobbies: Quilting; Choir Set designer)  Patient Goals: Reduce pain     OBJECTIVE:  Cognition Cognition WNL.     Gross Musculoskeletal Assessment Tremor: None; Bulk: Normal, no muscle wasting noted; Tone: Normal; No erythema, edema, or ecchymosis noted;  GAIT: Distance walked: 58m Assistive device utilized: None Level of assistance: Complete Independence Comments: Slowed velocity, decreased hip ext in LLE  Posture:   AROM AROM (Normal range in degrees) AROM   Lumbar   Flexion (65)   Extension (30)   Right lateral flexion (25)   Left lateral flexion (25)   Right rotation (30)   Left rotation (30)       Hip Right Left  Flexion (125) WNL WNL  Extension (15)    Abduction (40)    Adduction (30)    Internal Rotation (45) WNL WNL  External Rotation (45) WNL WNL      Knee    Flexion (135) WNL WNL  Extension (0) WNL WNL      Ankle    Dorsiflexion (20)    Plantarflexion (50)    Inversion (35)    Eversion (15)    (* = pain; Blank rows = not tested)  LE MMT: MMT (out of 5) Right  Left   Hip flexion 4 3+  Hip extension    Hip abduction  4-  Hip adduction    Hip internal rotation 5 5  Hip external rotation 5 4  Knee flexion 4 4  Knee extension 4 4  Ankle dorsiflexion 5 5  Ankle plantarflexion 5 5  Ankle inversion    Ankle eversion    (* = pain; Blank rows = not tested)  Sensation Grossly intact to light touch throughout bilateral LEs as determined by testing dermatomes L2-S2. Proprioception, stereognosis, and hot/cold testing deferred on this date.  Reflexes R/L Knee Jerk (L3/4): 2+/2+  Ankle Jerk (S1/2): 2+/2+   Muscle Length Hamstrings: R: Negative L: Negative Thomas (hip flexors): R: Negative  L: Positive for Rectus Femoris tightness   Palpation Location Right Left         Lumbar  paraspinals    Quadratus Lumborum    Iliac Crest    ASIS  1  Proximal Rectus Femoris  1      Vastus Lateralis   1  Vastus Medius   1  IT Band   1  Greater Trochanter  1  (Blank rows = not tested) Graded on 0-4 scale (0 = no pain, 1 = pain, 2 = pain with wincing/grimacing/flinching, 3 = pain with withdrawal, 4 = unwilling to allow palpation)  Passive Accessory Intervertebral Motion Deferred   Special Tests  Hip: FABER (SN 81): R: Negative L: Negative FADIR (SN 94): R: Negative L: Negative Hip scour (SN 50): R: Not examined L: Not examined  SIJ:  Thigh Thrust (SN 88, -LR 0.18) : R: Not examined L: Not examined  Piriformis Syndrome: FAIR Test (SN 88, SP 83): R: Not examined L: Not examined  Physical Performance Measures  : 13.71s = .72 m/s 30s STS: 14.5 reps Deep squat: Narrow BOS, minor bilateral knee valgus noted  TODAY'S TREATMENT: DATE: 04/27/24  Subjective: Patient reports 0/10 pain in the L anterior hip. She will continue to participate in her gym program in order to maintain strength. No further questions or concerns at start of session.    Therapeutic Exercise:  NuStep L5-4x 5 min x UE/LE x SPM maintained > 90 (Seat 8) for LE warm up, endurance and strength; PT manually adjusted  resistance throughout bout.    Hip Matrix Cable Machine  Hip Flexion    R/L: 2 x 10 40#, 1 x 10 55#  Hip Abduction   R/L: 3 x 10 25#   Supine Bridge against resistance    3 x 10 2x 4# AW on stomach    Supine SLR against resistance    R/L: 2 x 10 - 4# AW  Therapeutic Activity:   Forward Step Down - BUE Support for increasing capacity of stairs    R/L: 2 x 10 ea     Reverse TRX Lunge    3 x 10 - alternating LE    VC for reverse stepping in the L foot; corrected placement in order to optimize BoS.    Kettle Bell Squat   2 x 10 - 20# KB    - Great demonstration of hip flexion, neutral spine and breathing technique     PATIENT EDUCATION:  Education details: HEP,  Exercise Technique Person educated: Patient Education method: Explanation, Demonstration, and Handouts Education comprehension: verbalized understanding and returned demonstration   HOME EXERCISE PROGRAM:  Access Code: [email or web address] URL: [email or web address] Date: 04/20/2024 Prepared by: Lonni Pall  Exercises - Supine Active Straight Leg Raise  - 1 x daily - 3-4 x weekly - 2-3 sets - 10 reps - Clam with Resistance  - 1 x daily - 3-4 x weekly - 2-3 sets - 10-12 reps - Sidelying Hip Abduction  - 1 x daily - 3-4 x weekly - 2-3 sets - 10-12 reps - Supine Bridge  - 1 x daily - 3-4 x weekly - 2-3 sets - 10-12 reps - Lateral Step Down  - 1 x daily - 3-4 x weekly - 2-3 sets - 10 reps - Hip Flexor Stretch on Step  - 1 x daily - 7 x weekly - 3 sets - 30s hold - Forward Step Down Touch with Heel  - 1 x daily - 3-4 x weekly - 2-3 sets - 10 reps - Supine 90/90 Overhead Dumbbell Raise  - 1 x daily - 3-4 x weekly - 2-3 sets - 10 reps - Squat with Resistance at Thighs  - 1 x daily - 3-4 x weekly - 2-3 sets - 10-12 reps  ASSESSMENT:  CLINICAL IMPRESSION: Continued PT POC focused on improving L hip pain and LE strength. Continued hip flexor strengthening in order to reduce anterior tightness and pain in hip flexor musculature. She still has good carryover with reverse lunges and kettlebell squats; PT added exercises to HEP following today's session Patient agreeable to discharge in the next couple appts depending on symptom response. PT encouraged continued adherence to HEP and gym protocol throughout the weekend in order to reduce pain. No questions at end of session.   OBJECTIVE IMPAIRMENTS: decreased activity tolerance, difficulty walking, decreased strength, and pain.   ACTIVITY LIMITATIONS: squatting and stairs  PARTICIPATION LIMITATIONS: community activity and church  PERSONAL FACTORS: Age, Past/current experiences, Time since onset of injury/illness/exacerbation, and 1-2  comorbidities: Hx of THA (2024), Type II DM are also affecting patient's functional outcome.   REHAB POTENTIAL: Good  CLINICAL DECISION MAKING: Stable/uncomplicated  EVALUATION COMPLEXITY: Low   GOALS: Goals reviewed with patient? No  SHORT TERM GOALS: Target date: 06/08/2024  Pt will be independent with HEP in order to improve strength and decrease hip pain to improve pain-free function at home and work. Baseline: Initial HEP provided. 04/06/2024: Pt able to perform all exercises ind and with verbal cues Goal status: Goal met  LONG TERM GOALS: Target date: 07/20/2024  Pt will decrease 30s STS by at least 5 seconds in order to demonstrate clinically significant improvement in LE strength   Baseline: 02/29/2024: 14.5 reps; 04/06/24: 15 reps  Goal status: Progressing   2.  Pt will decrease worst hip pain by at least 3 points on the NPRS in order to demonstrate clinically significant reduction in hip pain. Baseline: 02/29/2024: 4/10; 03/28/2024: 2/10 NPS; 04/25/2024: 2/10 Goal status: Progressing  3.  Pt will increase by at least 0.13 m/s in order to demonstrate clinically significant improvement in community ambulation.        Baseline: 02/29/2024: .72 m/s; 04/06/2024: 1.05 m/s  Goal status: Progressing  4.  Pt will increase LEFS by at least 9 points in order to demonstrate significant improvement in lower extremity function.      Baseline: 02/29/2024: 58 / 80 = 72.5 %; 04/06/2024: 70/80 = 87.5%  Goal status: Progressing   5.  Patient will be able to safely navigate a flight of 10 stairs, using proper foot placement and handrail support, without requiring assistance from PT in order to demonstrate significant improvement in LE strength and safety. Baseline: 02/29/2024: deferred  Goal status: Deferred   PLAN: PT FREQUENCY: 1-2x/week  PT DURATION: 12 weeks  PLANNED INTERVENTIONS: Therapeutic exercises, Therapeutic activity, Neuromuscular re-education, Balance  training, Gait training, Patient/Family education, Self Care, Joint mobilization, Joint manipulation, Vestibular training, Canalith repositioning, Orthotic/Fit training, DME instructions, Dry Needling, Electrical stimulation, Spinal manipulation, Spinal mobilization, Cryotherapy, Moist heat, Taping, Traction, Ultrasound, Ionotophoresis 4mg /ml Dexamethasone, Manual therapy, and Re-evaluation.  PLAN FOR NEXT SESSION: Progression functional strengthening, Hip flexor stretching   Lonni Pall PT, DPT Physical Therapist- [facility]  04/27/2024, 9:51 AM

## 2024-05-01 ENCOUNTER — Ambulatory Visit: Attending: Orthopaedic Surgery

## 2024-05-01 DIAGNOSIS — R2689 Other abnormalities of gait and mobility: Secondary | ICD-10-CM | POA: Insufficient documentation

## 2024-05-01 DIAGNOSIS — M79652 Pain in left thigh: Secondary | ICD-10-CM | POA: Insufficient documentation

## 2024-05-01 DIAGNOSIS — M6281 Muscle weakness (generalized): Secondary | ICD-10-CM | POA: Diagnosis not present

## 2024-05-01 NOTE — Therapy (Signed)
 OUTPATIENT PHYSICAL THERAPY HIP TREATMENT   Patient Name: Jacqueline Mata MRN: [id number] DOB:06-14-1941, 83 y.o., female Today's Date: 05/01/2024  END OF SESSION:  PT End of Session - 05/01/24 1301     Visit Number 17    Number of Visits 25    Date for PT Re-Evaluation 05/23/24    PT Start Time 1301    PT Stop Time 1345    PT Time Calculation (min) 44 min    Activity Tolerance Patient tolerated treatment well;No increased pain    Behavior During Therapy [facility] for tasks assessed/performed             Past Medical History:  Diagnosis Date   Arthritis    breast cancer 2005   bilateral   Cataract 01/04/13 and 03/01/13   Complication of anesthesia    dizziness    Hyperlipidemia    hypothyroidism    Hypothyroidism    Personal history of chemotherapy    Personal history of radiation therapy    PONV (postoperative nausea and vomiting)    Pre-diabetes    Sleep apnea    mild diagnosis no cpap   Tear of left biceps muscle 01/08/2021   Tinnitus    Past Surgical History:  Procedure Laterality Date   bilateral knee replacement s     BREAST BIOPSY Left 2005   positive   BREAST BIOPSY Right 2005   positive   BREAST LUMPECTOMY Left 2005   BREAST LUMPECTOMY Right 2005   gumm surgery      JOINT REPLACEMENT  2013   by Dr. mardee   righ tknee arthroscopy      TONSILLECTOMY     TOTAL HIP ARTHROPLASTY Right 11/12/2020   Procedure: RIGHT TOTAL HIP ARTHROPLASTY ANTERIOR APPROACH;  Surgeon: Sheril Coy, MD;  Location: [facility];  Service: Orthopedics;  Laterality: Right;   TOTAL HIP ARTHROPLASTY Left 11/25/2021   Procedure: LEFT TOTAL HIP ARTHROPLASTY ANTERIOR APPROACH;  Surgeon: Sheril Coy, MD;  Location: [facility];  Service: Orthopedics;  Laterality: Left;   Patient Active Problem List   Diagnosis Date Noted   Pain of left thumb 02/13/2024   Type 2 diabetes mellitus with diabetic chronic kidney disease (HCC) 02/13/2024   Nocturnal leg cramps 11/06/2023   Seasonal allergies  06/20/2023   Sensorineural hearing loss (SNHL) of both ears 10/21/2022   Primary localized osteoarthritis of left hip 11/25/2021   Rosacea 11/10/2021   Chronic hip pain, left 11/10/2021   Chronic pain in left shoulder 05/06/2021   CKD stage 3a, GFR 45-59 ml/min (HCC) 05/06/2021   Mixed stress and urge urinary incontinence 05/06/2021   Pain, dental 02/10/2021   Preoperative evaluation to rule out surgical contraindication 11/04/2020   Primary osteoarthritis of right hip 07/20/2020   Pre-ulcerative corn or callous 07/09/2020   Elevated liver enzymes 04/16/2020   Short-term memory loss 07/31/2019   Educated about COVID-19 virus infection 01/30/2019   Osteopenia determined by x-ray 03/02/2018   Myalgia due to statin 01/02/2018   Stage 2 carcinoma of breast, ER+, unspecified laterality (HCC) 04/29/2017   Onychomycosis of great toe 03/19/2016   Cervical radiculopathy due to degenerative joint disease of spine 03/19/2015   Acromioclavicular joint arthritis 02/19/2015   Rotator cuff dysfunction 01/08/2015   Shoulder pain, bilateral 12/21/2014   Sciatica of left side 10/17/2014   Encounter for Medicare annual wellness exam 05/22/2014   Basal cell carcinoma of leg 12/20/2013   Overweight (BMI 25.0-29.9) 08/08/2013   Statin intolerance 08/07/2013   Balance problem due  to vestibular dysfunction 05/01/2012   Status post total knee replacement 05/01/2012   History of breast cancer 11/09/2011   Acquired hypothyroidism 07/26/2011   Hyperlipidemia associated with type 2 diabetes mellitus (HCC) 07/23/2011    PCP: Marylynn Verneita CROME, MD  REFERRING PROVIDER: Sheril Coy, MD  REFERRING DIAG: [phone] (ICD-10-CM) - Injury of left quadriceps femoris muscle   RATIONALE FOR EVALUATION AND TREATMENT: Rehabilitation  THERAPY DIAG: Pain in left thigh  Muscle weakness (generalized)  Other abnormalities of gait and mobility  ONSET DATE: December 22 2023  FOLLOW-UP APPT SCHEDULED WITH REFERRING  PROVIDER: Didn't address   SUBJECTIVE:                                                                                                                                                                                         SUBJECTIVE STATEMENT:  Patient with chief concern of L thigh pain  PERTINENT HISTORY:   Patient is a 83 y.o. female reporting to OPPT with L thigh pain. Patient reports on palm Sunday during a choir performance she was in a static partial forward lunge with a torso rotation (R leg in front). She reports holding that position for a prolonged time and turned toward the choir director and experienced a sharp pain. Following that day she saw her surgeon regarding hip concerns; pt reports that physician cleared her any hip diagnosis or pain stemming from gluteal musculature. Additionally she spoke with her chiropractor; only findings discussed was leg length discrepancy (R shorter than L). She describes her pain as sharp and aching with specific movements and it remains constant. Aggravating factors involve prolonged walking and standing. Pain is alleviated with rest or having the L leg extended and supported on a stool. Patient reports that she has some difficulty with lifting L leg when turning to another direction. Currently she denies numbness, tingling, changes in b/b.   PAIN:    Pain Intensity: Present: 3/10, Best: 2/10, Worst: 4/10 Pain location: L Proximal thigh  Pain Quality: constant, sharp, and aching  Radiating: No  Numbness/Tingling: No Focal Weakness:  Relieving factors: Resting  How long can you sit:  How long can you stand: < 30 min  History of prior back or hip injury, pain, surgery, or therapy: Yes Red flags: Negative for bowel/bladder changes.  PRECAUTIONS: Fall  WEIGHT BEARING RESTRICTIONS: No  FALLS: Has patient fallen in last 6 months? No  Living Environment Lives with: lives alone Lives in: House/apartment Stairs: Yes: Internal: 13 steps; can  reach both and External: 3 steps; can reach both Has following equipment at home: None  Prior level of function: Independent  Occupational demands: Retired  Hobbies: Quilting; Choir Set designer)  Patient Goals: Reduce pain     OBJECTIVE:  Cognition Cognition WNL.     Gross Musculoskeletal Assessment Tremor: None; Bulk: Normal, no muscle wasting noted; Tone: Normal; No erythema, edema, or ecchymosis noted;  GAIT: Distance walked: 94m Assistive device utilized: None Level of assistance: Complete Independence Comments: Slowed velocity, decreased hip ext in LLE  Posture:   AROM AROM (Normal range in degrees) AROM   Lumbar   Flexion (65)   Extension (30)   Right lateral flexion (25)   Left lateral flexion (25)   Right rotation (30)   Left rotation (30)       Hip Right Left  Flexion (125) WNL WNL  Extension (15)    Abduction (40)    Adduction (30)    Internal Rotation (45) WNL WNL  External Rotation (45) WNL WNL      Knee    Flexion (135) WNL WNL  Extension (0) WNL WNL      Ankle    Dorsiflexion (20)    Plantarflexion (50)    Inversion (35)    Eversion (15)    (* = pain; Blank rows = not tested)  LE MMT: MMT (out of 5) Right  Left   Hip flexion 4 3+  Hip extension    Hip abduction  4-  Hip adduction    Hip internal rotation 5 5  Hip external rotation 5 4  Knee flexion 4 4  Knee extension 4 4  Ankle dorsiflexion 5 5  Ankle plantarflexion 5 5  Ankle inversion    Ankle eversion    (* = pain; Blank rows = not tested)  Sensation Grossly intact to light touch throughout bilateral LEs as determined by testing dermatomes L2-S2. Proprioception, stereognosis, and hot/cold testing deferred on this date.  Reflexes R/L Knee Jerk (L3/4): 2+/2+  Ankle Jerk (S1/2): 2+/2+   Muscle Length Hamstrings: R: Negative L: Negative Thomas (hip flexors): R: Negative  L: Positive for Rectus Femoris tightness   Palpation Location Right Left         Lumbar  paraspinals    Quadratus Lumborum    Iliac Crest    ASIS  1  Proximal Rectus Femoris  1      Vastus Lateralis   1  Vastus Medius   1  IT Band   1  Greater Trochanter  1  (Blank rows = not tested) Graded on 0-4 scale (0 = no pain, 1 = pain, 2 = pain with wincing/grimacing/flinching, 3 = pain with withdrawal, 4 = unwilling to allow palpation)  Passive Accessory Intervertebral Motion Deferred   Special Tests  Hip: FABER (SN 81): R: Negative L: Negative FADIR (SN 94): R: Negative L: Negative Hip scour (SN 50): R: Not examined L: Not examined  SIJ:  Thigh Thrust (SN 88, -LR 0.18) : R: Not examined L: Not examined  Piriformis Syndrome: FAIR Test (SN 88, SP 83): R: Not examined L: Not examined  Physical Performance Measures  : 13.71s = .72 m/s 30s STS: 14.5 reps Deep squat: Narrow BOS, minor bilateral knee valgus noted  TODAY'S TREATMENT: DATE: 05/01/24   Subjective: Patient reports 0/10 NPS in her L anterior thigh and hip flexor group. She reports that she attended the gym this past weekend without adverse effects. She is still agreeable to d/c in the next appt.   Objective: R Posterior Canal Assessment for BPPV  R Dix-Hall Leotha: -  R Sidelying: -    Therapeutic Exercise:  NuStep L5-4x 5 min x UE/LE x SPM maintained > 90 (Seat 8) for LE warm up, endurance and strength; PT manually adjusted resistance throughout bout.     Hip Matrix Cable Machine   Hip Abduction     R/L: 2 x 10 - 40#   Therapeutic Activity:  PT demo and review of exercises to perform at Gym or Home:   TRX Reverse Lunge for hip extensors and balance  3 x 8 ea leg   Forward Lunge for quadriceps and balance   3 x 8 - SUE support    Runner's Marches against resistance   1 x 16 alternating LE - red TB around knee     1 x 20 alternating LE - RTB    Lateral Lunges with Kettlebell - Foot on 6 Step   2 x 10 ea side - 10# KB    PATIENT EDUCATION:  Education details: HEP, Exercise  Technique Person educated: Patient Education method: Explanation, Demonstration, and Handouts Education comprehension: verbalized understanding and returned demonstration   HOME EXERCISE PROGRAM:  Access Code: [email or web address] URL: [email or web address] Date: 04/20/2024 Prepared by: Lonni Pall  Exercises - Supine Active Straight Leg Raise  - 1 x daily - 3-4 x weekly - 2-3 sets - 10 reps - Clam with Resistance  - 1 x daily - 3-4 x weekly - 2-3 sets - 10-12 reps - Sidelying Hip Abduction  - 1 x daily - 3-4 x weekly - 2-3 sets - 10-12 reps - Supine Bridge  - 1 x daily - 3-4 x weekly - 2-3 sets - 10-12 reps - Lateral Step Down  - 1 x daily - 3-4 x weekly - 2-3 sets - 10 reps - Hip Flexor Stretch on Step  - 1 x daily - 7 x weekly - 3 sets - 30s hold - Forward Step Down Touch with Heel  - 1 x daily - 3-4 x weekly - 2-3 sets - 10 reps - Supine 90/90 Overhead Dumbbell Raise  - 1 x daily - 3-4 x weekly - 2-3 sets - 10 reps - Squat with Resistance at Thighs  - 1 x daily - 3-4 x weekly - 2-3 sets - 10-12 reps  ASSESSMENT:  CLINICAL IMPRESSION: Continued PT POC focused on improving L hip pain and LE strength. Patient demonstrating complete independence with all exercises today. Some verbal cueing needed for foot placement during reverse lunges. Her pain and tissue extensiblity in the quadriceps/hip flexor group have made significant gains. PT performed Dix hallpike and R Sidelying maneuver in order to reassess R posterior BPPV related symptoms; both negative. Patient agreeable to discharge in the next appt.   OBJECTIVE IMPAIRMENTS: decreased activity tolerance, difficulty walking, decreased strength, and pain.   ACTIVITY LIMITATIONS: squatting and stairs  PARTICIPATION LIMITATIONS: community activity and church  PERSONAL FACTORS: Age, Past/current experiences, Time since onset of injury/illness/exacerbation, and 1-2 comorbidities: Hx of THA (2024), Type II DM are also affecting  patient's functional outcome.   REHAB POTENTIAL: Good  CLINICAL DECISION MAKING: Stable/uncomplicated  EVALUATION COMPLEXITY: Low   GOALS: Goals reviewed with patient? No  SHORT TERM GOALS: Target date: 06/12/2024  Pt will be independent with HEP in order to improve strength and decrease hip pain to improve pain-free function at home and work. Baseline: Initial HEP provided. 04/06/2024: Pt able to perform all exercises ind and with verbal cues Goal status: Goal met   LONG TERM GOALS: Target date: 07/24/2024  Pt will decrease 30s STS by at least 5 seconds  in order to demonstrate clinically significant improvement in LE strength   Baseline: 02/29/2024: 14.5 reps; 04/06/24: 15 reps  Goal status: Progressing   2.  Pt will decrease worst hip pain by at least 3 points on the NPRS in order to demonstrate clinically significant reduction in hip pain. Baseline: 02/29/2024: 4/10; 03/28/2024: 2/10 NPS; 04/25/2024: 2/10; 05/01/2024: 0/10 Goal status: Progressing  3.  Pt will increase by at least 0.13 m/s in order to demonstrate clinically significant improvement in community ambulation.        Baseline: 02/29/2024: .72 m/s; 04/06/2024: 1.05 m/s  Goal status: Progressing  4.  Pt will increase LEFS by at least 9 points in order to demonstrate significant improvement in lower extremity function.      Baseline: 02/29/2024: 58 / 80 = 72.5 %; 04/06/2024: 70/80 = 87.5%  Goal status: Progressing   5.  Patient will be able to safely navigate a flight of 10 stairs, using proper foot placement and handrail support, without requiring assistance from PT in order to demonstrate significant improvement in LE strength and safety. Baseline: 02/29/2024: deferred  Goal status: Deferred   PLAN: PT FREQUENCY: 1-2x/week  PT DURATION: 12 weeks  PLANNED INTERVENTIONS: Discharge  PLAN FOR NEXT SESSION: Discharge    Lonni Pall PT, DPT Physical Therapist- [facility]  [facility]  05/01/2024, 1:05 PM

## 2024-05-02 ENCOUNTER — Encounter

## 2024-05-04 ENCOUNTER — Ambulatory Visit

## 2024-05-04 DIAGNOSIS — M6281 Muscle weakness (generalized): Secondary | ICD-10-CM

## 2024-05-04 DIAGNOSIS — M546 Pain in thoracic spine: Secondary | ICD-10-CM | POA: Diagnosis not present

## 2024-05-04 DIAGNOSIS — M79652 Pain in left thigh: Secondary | ICD-10-CM

## 2024-05-04 DIAGNOSIS — M9903 Segmental and somatic dysfunction of lumbar region: Secondary | ICD-10-CM | POA: Diagnosis not present

## 2024-05-04 DIAGNOSIS — M542 Cervicalgia: Secondary | ICD-10-CM | POA: Diagnosis not present

## 2024-05-04 DIAGNOSIS — R2689 Other abnormalities of gait and mobility: Secondary | ICD-10-CM

## 2024-05-04 DIAGNOSIS — M62830 Muscle spasm of back: Secondary | ICD-10-CM | POA: Diagnosis not present

## 2024-05-04 NOTE — Therapy (Signed)
 OUTPATIENT PHYSICAL THERAPY HIP TREATMENT/DISCHARGE SUMMARY    Patient Name: Jacqueline Mata MRN: [id number] DOB:[birth year], 83 y.o., female Today's Date: 05/04/2024  END OF SESSION:  PT End of Session - 05/04/24 0937     Visit Number 18    Number of Visits 25    Date for PT Re-Evaluation 05/23/24    PT Start Time 0938    PT Stop Time 1023    PT Time Calculation (min) 45 min    Activity Tolerance Patient tolerated treatment well;No increased pain    Behavior During Therapy [facility] for tasks assessed/performed              Past Medical History:  Diagnosis Date   Arthritis    breast cancer 2005   bilateral   Cataract 01/04/13 and 03/01/13   Complication of anesthesia    dizziness    Hyperlipidemia    hypothyroidism    Hypothyroidism    Personal history of chemotherapy    Personal history of radiation therapy    PONV (postoperative nausea and vomiting)    Pre-diabetes    Sleep apnea    mild diagnosis no cpap   Tear of left biceps muscle 01/08/2021   Tinnitus    Past Surgical History:  Procedure Laterality Date   bilateral knee replacement s     BREAST BIOPSY Left 2005   positive   BREAST BIOPSY Right 2005   positive   BREAST LUMPECTOMY Left 2005   BREAST LUMPECTOMY Right 2005   gumm surgery      JOINT REPLACEMENT  2013   by Dr. mardee   righ tknee arthroscopy      TONSILLECTOMY     TOTAL HIP ARTHROPLASTY Right 11/12/2020   Procedure: RIGHT TOTAL HIP ARTHROPLASTY ANTERIOR APPROACH;  Surgeon: Sheril Coy, MD;  Location: [facility];  Service: Orthopedics;  Laterality: Right;   TOTAL HIP ARTHROPLASTY Left 11/25/2021   Procedure: LEFT TOTAL HIP ARTHROPLASTY ANTERIOR APPROACH;  Surgeon: Sheril Coy, MD;  Location: [facility];  Service: Orthopedics;  Laterality: Left;   Patient Active Problem List   Diagnosis Date Noted   Pain of left thumb 02/13/2024   Type 2 diabetes mellitus with diabetic chronic kidney disease (HCC) 02/13/2024   Nocturnal leg cramps 11/06/2023    Seasonal allergies 06/20/2023   Sensorineural hearing loss (SNHL) of both ears 10/21/2022   Primary localized osteoarthritis of left hip 11/25/2021   Rosacea 11/10/2021   Chronic hip pain, left 11/10/2021   Chronic pain in left shoulder 05/06/2021   CKD stage 3a, GFR 45-59 ml/min (HCC) 05/06/2021   Mixed stress and urge urinary incontinence 05/06/2021   Pain, dental 02/10/2021   Preoperative evaluation to rule out surgical contraindication 11/04/2020   Primary osteoarthritis of right hip 07/20/2020   Pre-ulcerative corn or callous 07/09/2020   Elevated liver enzymes 04/16/2020   Short-term memory loss 07/31/2019   Educated about COVID-19 virus infection 01/30/2019   Osteopenia determined by x-ray 03/02/2018   Myalgia due to statin 01/02/2018   Stage 2 carcinoma of breast, ER+, unspecified laterality (HCC) 04/29/2017   Onychomycosis of great toe 03/19/2016   Cervical radiculopathy due to degenerative joint disease of spine 03/19/2015   Acromioclavicular joint arthritis 02/19/2015   Rotator cuff dysfunction 01/08/2015   Shoulder pain, bilateral 12/21/2014   Sciatica of left side 10/17/2014   Encounter for Medicare annual wellness exam 05/22/2014   Basal cell carcinoma of leg 12/20/2013   Overweight (BMI 25.0-29.9) 08/08/2013   Statin intolerance 08/07/2013  Balance problem due to vestibular dysfunction 05/01/2012   Status post total knee replacement 05/01/2012   History of breast cancer 11/09/2011   Acquired hypothyroidism 07/26/2011   Hyperlipidemia associated with type 2 diabetes mellitus (HCC) 07/23/2011    PCP: Marylynn Verneita CROME, MD  REFERRING PROVIDER: Sheril Coy, MD  REFERRING DIAG: [phone] (ICD-10-CM) - Injury of left quadriceps femoris muscle   RATIONALE FOR EVALUATION AND TREATMENT: Rehabilitation  THERAPY DIAG: Pain in left thigh  Muscle weakness (generalized)  Other abnormalities of gait and mobility  ONSET DATE: December 22 2023  FOLLOW-UP APPT SCHEDULED  WITH REFERRING PROVIDER: Didn't address   SUBJECTIVE:                                                                                                                                                                                         SUBJECTIVE STATEMENT:  Patient with chief concern of L thigh pain  PERTINENT HISTORY:   Patient is a 83 y.o. female reporting to OPPT with L thigh pain. Patient reports on palm Sunday during a choir performance she was in a static partial forward lunge with a torso rotation (R leg in front). She reports holding that position for a prolonged time and turned toward the choir director and experienced a sharp pain. Following that day she saw her surgeon regarding hip concerns; pt reports that physician cleared her any hip diagnosis or pain stemming from gluteal musculature. Additionally she spoke with her chiropractor; only findings discussed was leg length discrepancy (R shorter than L). She describes her pain as sharp and aching with specific movements and it remains constant. Aggravating factors involve prolonged walking and standing. Pain is alleviated with rest or having the L leg extended and supported on a stool. Patient reports that she has some difficulty with lifting L leg when turning to another direction. Currently she denies numbness, tingling, changes in b/b.   PAIN:    Pain Intensity: Present: 3/10, Best: 2/10, Worst: 4/10 Pain location: L Proximal thigh  Pain Quality: constant, sharp, and aching  Radiating: No  Numbness/Tingling: No Focal Weakness:  Relieving factors: Resting  How long can you sit:  How long can you stand: < 30 min  History of prior back or hip injury, pain, surgery, or therapy: Yes Red flags: Negative for bowel/bladder changes.  PRECAUTIONS: Fall  WEIGHT BEARING RESTRICTIONS: No  FALLS: Has patient fallen in last 6 months? No  Living Environment Lives with: lives alone Lives in: House/apartment Stairs: Yes: Internal: 13  steps; can reach both and External: 3 steps; can reach both Has following equipment at home: None  Prior level of function: Independent  Occupational demands: Retired   Presenter, broadcasting: Doctor, general practice; Choir Set designer)  Patient Goals: Reduce pain     OBJECTIVE:  Cognition Cognition WNL.     Gross Musculoskeletal Assessment Tremor: None; Bulk: Normal, no muscle wasting noted; Tone: Normal; No erythema, edema, or ecchymosis noted;  GAIT: Distance walked: 32m Assistive device utilized: None Level of assistance: Complete Independence Comments: Slowed velocity, decreased hip ext in LLE  Posture:   AROM AROM (Normal range in degrees) AROM   Lumbar   Flexion (65)   Extension (30)   Right lateral flexion (25)   Left lateral flexion (25)   Right rotation (30)   Left rotation (30)       Hip Right Left  Flexion (125) WNL WNL  Extension (15)    Abduction (40)    Adduction (30)    Internal Rotation (45) WNL WNL  External Rotation (45) WNL WNL      Knee    Flexion (135) WNL WNL  Extension (0) WNL WNL      Ankle    Dorsiflexion (20)    Plantarflexion (50)    Inversion (35)    Eversion (15)    (* = pain; Blank rows = not tested)  LE MMT: MMT (out of 5) Right  Left   Hip flexion 4 3+  Hip extension    Hip abduction  4-  Hip adduction    Hip internal rotation 5 5  Hip external rotation 5 4  Knee flexion 4 4  Knee extension 4 4  Ankle dorsiflexion 5 5  Ankle plantarflexion 5 5  Ankle inversion    Ankle eversion    (* = pain; Blank rows = not tested)  Sensation Grossly intact to light touch throughout bilateral LEs as determined by testing dermatomes L2-S2. Proprioception, stereognosis, and hot/cold testing deferred on this date.  Reflexes R/L Knee Jerk (L3/4): 2+/2+  Ankle Jerk (S1/2): 2+/2+   Muscle Length Hamstrings: R: Negative L: Negative Thomas (hip flexors): R: Negative  L: Positive for Rectus Femoris tightness   Palpation Location Right Left          Lumbar paraspinals    Quadratus Lumborum    Iliac Crest    ASIS  1  Proximal Rectus Femoris  1      Vastus Lateralis   1  Vastus Medius   1  IT Band   1  Greater Trochanter  1  (Blank rows = not tested) Graded on 0-4 scale (0 = no pain, 1 = pain, 2 = pain with wincing/grimacing/flinching, 3 = pain with withdrawal, 4 = unwilling to allow palpation)  Passive Accessory Intervertebral Motion Deferred   Special Tests  Hip: FABER (SN 81): R: Negative L: Negative FADIR (SN 94): R: Negative L: Negative Hip scour (SN 50): R: Not examined L: Not examined  SIJ:  Thigh Thrust (SN 88, -LR 0.18) : R: Not examined L: Not examined  Piriformis Syndrome: FAIR Test (SN 88, SP 83): R: Not examined L: Not examined  Physical Performance Measures  : 13.71s = .72 m/s 30s STS: 14.5 reps Deep squat: Narrow BOS, minor bilateral knee valgus noted  TODAY'S TREATMENT: DATE: 05/04/24   Subjective: Patient reports 0/10 in the L thigh. No new reports since last PT session. Patient still agreeable to discharge this session. No questions or   Therapeutic Exercise:  Time spent discussing and reviewing each prescribed HEP/gym program exercise - 10 Min    - Complete verbal understanding of all exercises.   Hip  Flexor Stretch on Step    R/L: 30s/bout x 2 in order to improve ROM and tissue extensibility  Therapeutic Activity:  NuStep L5-2 x 3 min x UE/LE x SPM maintained > 90 (Seat 8) for LE warm up, endurance and strength; PT manually adjusted resistance throughout per patient's tolerance.   Kettle Bell Swing   2 x 10 - 10#   Kettle Bell Squat  2 x 10 - 20#    Lateral Step Down  1 x 8  - SUE Support  TRX Reverse Lunges   R/L: 2 x 8   Lateral Lunges for mobility (adductor stretch and thigh loading)   R/L: 2 x 8 ea leg      PPM (1 Min unbilled):  5TSTS: 7.92s -  (80-89 years: 17.2  5.5 seconds (female))   The Five Times Sit to Stand Test measures one aspect of transfer skill.  The test provides a method to quantify functional lower extremity strength and/or identify movement strategies a patient uses to complete transitional movements.   : 1.75m/s - (Norms - 80s/90s:  0.943)  The 10 Meter Walk Test is a performance measure used to assess walking or gait speed in meters per second over a short distance. Cutoff Scores (Healthy older adults): < 0.7 m/s is indicative of increased risk of adverse events (fall, hospitalization, need for caregiver, fracture, etc.)   30s STS: 26 reps (age related - 9 reps)  The 30-Second Sit-to-Stand (30s STS) test is a common assessment of lower body strength and endurance, particularly in older adults   PATIENT EDUCATION:  Education details: HEP, Exercise Technique Person educated: Patient Education method: Explanation, Demonstration, and Handouts Education comprehension: verbalized understanding and returned demonstration   HOME EXERCISE PROGRAM:  Access Code: [email or web address])  URL: [email or web address] Date: 05/04/2024 Prepared by: Lonni Pall  Exercises - Squat with TRX  - 1 x daily - 2-3 x weekly - 2-3 sets - 10-12 reps - Assisted Lunge with TRX  - 1 x daily - 2-3 x weekly - 2-3 sets - 8-10 reps - Knee Extension with Weight Machine  - 1 x daily - 2-3 x weekly - 2-3 sets - 10-12 reps - Hamstring Curl with Weight Machine  - 1 x daily - 2-3 x weekly - 2-3 sets - 10-12 reps - Full Leg Press  - 1 x daily - 2-3 x weekly - 2-3 sets - 10-12 reps - Kettlebell Squat  - 1 x daily - 2-3 x weekly - 2-3 sets - 10-12 reps - Kettlebell Swing  - 1 x daily - 2-3 x weekly - 2-3 sets - 10-12 reps - Kettlebell Suitcase Carry  - 1 x daily - 2-3 x weekly - 2-3 sets - 10-12 reps - Lateral Step Down  - 1 x daily - 2-3 x weekly - 2-3 sets - 10-12 reps - Doorway Hip Flexor Stretch with Chair  - 1 x daily - 2-3 x weekly - 2-3 sets - 30s hold  Access Code: [email or web address] URL: [email or web address] Date:  04/20/2024 Prepared by: Lonni Pall  Exercises - Supine Active Straight Leg Raise  - 1 x daily - 3-4 x weekly - 2-3 sets - 10 reps - Clam with Resistance  - 1 x daily - 3-4 x weekly - 2-3 sets - 10-12 reps - Sidelying Hip Abduction  - 1 x daily - 3-4 x weekly - 2-3 sets - 10-12 reps - Supine Bridge  - 1 x daily - 3-4 x weekly - 2-3 sets -  10-12 reps - Lateral Step Down  - 1 x daily - 3-4 x weekly - 2-3 sets - 10 reps - Hip Flexor Stretch on Step  - 1 x daily - 7 x weekly - 3 sets - 30s hold - Forward Step Down Touch with Heel  - 1 x daily - 3-4 x weekly - 2-3 sets - 10 reps - Supine 90/90 Overhead Dumbbell Raise  - 1 x daily - 3-4 x weekly - 2-3 sets - 10 reps - Squat with Resistance at Thighs  - 1 x daily - 3-4 x weekly - 2-3 sets - 10-12 reps  ASSESSMENT:  CLINICAL IMPRESSION: Jacqueline Mata is a 83 y.o. female seen for L thigh pain. Pt reached end of POC and is agreeable to discharge to home/gym with HEP. Pt has met all remaining goals and demonstrates improvements in overall LE strength, mobility and pain. She demonstrates significant improvements in LE strength as indicated by 5TSTS (see below). Additionally she demonstrated increase in walking speed/functional mobility per (see below). Her self reported disability has improved significantly per LEFS (See below). The patient has achieved independence with daily activities and is able to perform exercises with proper technique. They no longer require skilled physical therapy interventions and are discharged with a home exercise program and gym program to maintain progress. No question at end of session. Follow-up with their primary care provider is recommended if any issues arise.  OBJECTIVE IMPAIRMENTS: decreased activity tolerance, difficulty walking, decreased strength, and pain.   ACTIVITY LIMITATIONS: squatting and stairs  PARTICIPATION LIMITATIONS: community activity and church  PERSONAL FACTORS: Age, Past/current  experiences, Time since onset of injury/illness/exacerbation, and 1-2 comorbidities: Hx of THA (2024), Type II DM are also affecting patient's functional outcome.   REHAB POTENTIAL: Good  CLINICAL DECISION MAKING: Stable/uncomplicated  EVALUATION COMPLEXITY: Low   GOALS: Goals reviewed with patient? No  SHORT TERM GOALS: Target date: 06/15/2024  Pt will be independent with HEP in order to improve strength and decrease hip pain to improve pain-free function at home and work. Baseline: Initial HEP provided. 04/06/2024: Pt able to perform all exercises ind and with verbal cues Goal status: Goal met   LONG TERM GOALS: Target date: 07/27/2024  Pt will decrease 30s STS by at least 5 seconds in order to demonstrate clinically significant improvement in LE strength   Baseline: 02/29/2024: 14.5 reps; 04/06/24: 15 reps; 05/04/2024:   Goal status: Progressing   2.  Pt will decrease worst hip pain by at least 3 points on the NPRS in order to demonstrate clinically significant reduction in hip pain. Baseline: 02/29/2024: 4/10; 03/28/2024: 2/10 NPS; 04/25/2024: 2/10; 05/01/2024: 0/10 Goal status: Goal Met  3.  Pt will increase by at least 0.13 m/s in order to demonstrate clinically significant improvement in community ambulation.        Baseline: 02/29/2024: .72 m/s; 04/06/2024: 1.05 m/s; 05/04/2024:1.10 m/s Goal status: Progressing  4.  Pt will increase LEFS by at least 9 points in order to demonstrate significant improvement in lower extremity function. (80/80 - No disability)      Baseline: 02/29/2024: 58 / 80 = 72.5 %; 04/06/2024: 70/80 = 87.5%; 05/04/2024: 80/80  Goal status: Progressing   5.  Patient will be able to safely navigate a flight of 10 stairs, using proper foot placement and handrail support, without requiring assistance from PT in order to demonstrate significant improvement in LE strength and safety. Baseline: 02/29/2024: deferred  Goal status: Deferred   PLAN: PT  FREQUENCY: 1-2x/week  PT DURATION: 12 weeks  PLANNED INTERVENTIONS: Discharge  PLAN FOR NEXT SESSION: Discharge    Lonni Pall PT, DPT Physical Therapist- [facility]  [place]  05/04/2024, 12:11 PM

## 2024-05-09 ENCOUNTER — Encounter

## 2024-05-11 ENCOUNTER — Ambulatory Visit

## 2024-05-11 ENCOUNTER — Other Ambulatory Visit: Payer: Self-pay | Admitting: Internal Medicine

## 2024-05-11 DIAGNOSIS — Z1231 Encounter for screening mammogram for malignant neoplasm of breast: Secondary | ICD-10-CM

## 2024-05-15 ENCOUNTER — Ambulatory Visit

## 2024-05-16 ENCOUNTER — Ambulatory Visit

## 2024-05-18 ENCOUNTER — Encounter

## 2024-05-19 ENCOUNTER — Ambulatory Visit (INDEPENDENT_AMBULATORY_CARE_PROVIDER_SITE_OTHER): Admitting: Internal Medicine

## 2024-05-19 ENCOUNTER — Encounter: Payer: Self-pay | Admitting: Internal Medicine

## 2024-05-19 VITALS — BP 110/66 | HR 84 | Temp 97.6°F | Ht 65.0 in | Wt 164.6 lb

## 2024-05-19 DIAGNOSIS — K824 Cholesterolosis of gallbladder: Secondary | ICD-10-CM

## 2024-05-19 DIAGNOSIS — E785 Hyperlipidemia, unspecified: Secondary | ICD-10-CM | POA: Diagnosis not present

## 2024-05-19 DIAGNOSIS — E1122 Type 2 diabetes mellitus with diabetic chronic kidney disease: Secondary | ICD-10-CM | POA: Diagnosis not present

## 2024-05-19 DIAGNOSIS — R0981 Nasal congestion: Secondary | ICD-10-CM

## 2024-05-19 DIAGNOSIS — R748 Abnormal levels of other serum enzymes: Secondary | ICD-10-CM

## 2024-05-19 DIAGNOSIS — E039 Hypothyroidism, unspecified: Secondary | ICD-10-CM | POA: Diagnosis not present

## 2024-05-19 DIAGNOSIS — K760 Fatty (change of) liver, not elsewhere classified: Secondary | ICD-10-CM | POA: Diagnosis not present

## 2024-05-19 DIAGNOSIS — E1169 Type 2 diabetes mellitus with other specified complication: Secondary | ICD-10-CM

## 2024-05-19 DIAGNOSIS — N1831 Chronic kidney disease, stage 3a: Secondary | ICD-10-CM

## 2024-05-19 DIAGNOSIS — K7581 Nonalcoholic steatohepatitis (NASH): Secondary | ICD-10-CM

## 2024-05-19 DIAGNOSIS — M79645 Pain in left finger(s): Secondary | ICD-10-CM

## 2024-05-19 LAB — COMPREHENSIVE METABOLIC PANEL WITH GFR
ALT: 34 U/L (ref 0–35)
AST: 26 U/L (ref 0–37)
Albumin: 4.5 g/dL (ref 3.5–5.2)
Alkaline Phosphatase: 66 U/L (ref 39–117)
BUN: 21 mg/dL (ref 6–23)
CO2: 24 meq/L (ref 19–32)
Calcium: 9.3 mg/dL (ref 8.4–10.5)
Chloride: 104 meq/L (ref 96–112)
Creatinine, Ser: 1.05 mg/dL (ref 0.40–1.20)
GFR: 49.53 mL/min — ABNORMAL LOW (ref >60.00)
Glucose, Bld: 132 mg/dL — ABNORMAL HIGH (ref 70–99)
Potassium: 4.3 meq/L (ref 3.5–5.1)
Sodium: 138 meq/L (ref 135–145)
Total Bilirubin: 1 mg/dL (ref 0.2–1.2)
Total Protein: 7.1 g/dL (ref 6.0–8.3)

## 2024-05-19 MED ORDER — LEVOTHYROXINE SODIUM 88 MCG PO TABS: ORAL_TABLET | ORAL | 1 refills | Status: DC

## 2024-05-19 MED ORDER — FLUTICASONE PROPIONATE 50 MCG/ACT NA SUSP: 2.0000 | Freq: Every day | NASAL | 6 refills | Status: DC

## 2024-05-19 NOTE — Assessment & Plan Note (Signed)
 Incidental finding on May 2025 RUQ ultrasound.  Repeat us  recommended in November

## 2024-05-19 NOTE — Progress Notes (Addendum)
 Subjective:  Patient ID: Jacqueline Mata, female    DOB: 10-14-1940  Age: 83 y.o. MRN: [id number]  CC: The primary encounter diagnosis was Hyperlipidemia associated with type 2 diabetes mellitus (HCC). Diagnoses of Acquired hypothyroidism, Type 2 diabetes mellitus with stage 3a chronic kidney disease, without long-term current use of insulin (HCC), Polyp of gallbladder, Congestion of paranasal sinus, Fatty liver, NASH (nonalcoholic steatohepatitis), and Pain of left thumb were also pertinent to this visit.   HPI KESHONA KARTES presents for  Chief Complaint  Patient presents with   Medical Management of Chronic Issues   Asked to return to discuss diagnosis of fatty liver (suggested by abd ultrasound) and GB polyp    1) Type 2 DM:  sHe feels generally well, is exercising several times per week and checking blood sugars once daily at variable times.  BS have been under 130 fasting and < 150 post prandially.  Denies any recent hypoglyemic events.  Taking her medications as directed. Following a carbohydrate modified diet 7 days per week. Denies numbness, burning and tingling of extremities. Appetite is good.      2) Hypotension  patient has been checking her blood pressure  at home  using a wrist cuff and readings  have been <120/70,  some as low as 99/67  .  She has vestibular issues  but denies feeling presycopal feelings   3) recovering  fom left hip strain with PT by Medford Pall  at [facility] outpatient on [facility]  4) thumb OA  : saw  [place] at [facility] , received a steroid injection which helped transiently.  Has not had the 2nd referral.  Discussed referral to [facility]  5) has to have several  lower teeth pulled to create a new bridge   Outpatient Medications Prior to Visit  Medication Sig Dispense Refill   ascorbic acid (VITAMIN C) 500 MG tablet Take 500 mg by mouth daily.     blood glucose meter kit and supplies Dispense based on patient and insurance preference. Use to check  blood sugars one time daily. (ICD-10 E11.9). 1 each 0   Cholecalciferol (VITAMIN D ) 50 MCG (2000 UT) tablet Take 2,000 Units by mouth daily.     COLLAGEN PO Take 1 Scoop by mouth every other day. With Probiotic     ezetimibe  (ZETIA ) 10 MG tablet Take 1 tablet (10 mg total) by mouth daily. 90 tablet 1   fexofenadine  (ALLEGRA  ALLERGY) 60 MG tablet Take 1 tablet (60 mg total) by mouth 2 (two) times daily. (Patient taking differently: Take 60 mg by mouth daily.) 180 tablet 1   folic acid  (FOLVITE ) 800 MCG tablet Take 800 mcg by mouth daily.     Lancets (ONETOUCH DELICA PLUS LANCET33G) MISC USE WITH STRIPS 100 each 3   MAGNESIUM GLUCONATE PO Take 2 capsules by mouth at bedtime.     ONETOUCH VERIO test strip USE TO CHECK BLOOD SUGARS TWICE DAILY 100 each 12   OVER THE COUNTER MEDICATION Bone Broth powder - alternates with collagen.     Propylene Glycol (SYSTANE BALANCE) 0.6 % SOLN Place 1 drop into both eyes daily as needed (dry eyes).     Turmeric (QC TUMERIC COMPLEX) 500 MG CAPS Take by mouth.     vitamin B-12 (CYANOCOBALAMIN ) 1000 MCG tablet Take 1,000 mcg by mouth daily.     amoxicillin  (AMOXIL ) 500 MG capsule Take 2,000 mg by mouth See admin instructions. Dental procedures. Takes 2000mg  one hour prior to dental procedures. (  Patient not taking: Reported on 05/19/2024)     metroNIDAZOLE (METROCREAM) 0.75 % cream Apply 1 Application topically 2 (two) times daily. (Patient not taking: Reported on 05/19/2024)     levothyroxine  (SYNTHROID ) 88 MCG tablet TAKE ONE TABLET EVERY DAY BEFORE BREAKFAST 90 tablet 1   No facility-administered medications prior to visit.    Review of Systems;  Patient denies headache, fevers, malaise, unintentional weight loss, skin rash, eye pain, sinus congestion and sinus pain, sore throat, dysphagia,  hemoptysis , cough, dyspnea, wheezing, chest pain, palpitations, orthopnea, edema, abdominal pain, nausea, melena, diarrhea, constipation, flank pain, dysuria, hematuria,  urinary  Frequency, nocturia, numbness, tingling, seizures,  Focal weakness, Loss of consciousness,  Tremor, insomnia, depression, anxiety, and suicidal ideation.      Objective:  BP 110/66   Pulse 84   Temp 97.6 F (36.4 C)   Ht 5' 5 (1.651 m)   Wt 164 lb 9.6 oz (74.7 kg)   SpO2 97%   BMI 27.39 kg/m   BP Readings from Last 3 Encounters:  05/19/24 110/66  03/02/24 139/84  02/11/24 124/72    Wt Readings from Last 3 Encounters:  05/19/24 164 lb 9.6 oz (74.7 kg)  03/02/24 165 lb 12.8 oz (75.2 kg)  02/11/24 163 lb 6.4 oz (74.1 kg)    Physical Exam Vitals reviewed.  Constitutional:      General: She is not in acute distress.    Appearance: Normal appearance. She is normal weight. She is not ill-appearing, toxic-appearing or diaphoretic.  HENT:     Head: Normocephalic.  Eyes:     General: No scleral icterus.       Right eye: No discharge.        Left eye: No discharge.     Conjunctiva/sclera: Conjunctivae normal.  Cardiovascular:     Rate and Rhythm: Normal rate and regular rhythm.     Heart sounds: Normal heart sounds.  Pulmonary:     Effort: Pulmonary effort is normal. No respiratory distress.     Breath sounds: Normal breath sounds.  Musculoskeletal:        General: Normal range of motion.  Skin:    General: Skin is warm and dry.  Neurological:     General: No focal deficit present.     Mental Status: She is alert and oriented to person, place, and time. Mental status is at baseline.  Psychiatric:        Mood and Affect: Mood normal.        Behavior: Behavior normal.        Thought Content: Thought content normal.        Judgment: Judgment normal.     Lab Results  Component Value Date   HGBA1C 6.8 (H) 02/11/2024   HGBA1C 7.2 (H) 11/04/2023   HGBA1C 6.5 (A) 05/03/2023    Lab Results  Component Value Date   CREATININE 1.05 05/19/2024   CREATININE 1.19 (H) 03/02/2024   CREATININE 1.01 (H) 02/11/2024    Lab Results  Component Value Date   WBC 7.3  03/02/2024   HGB 15.5 (H) 03/02/2024   HCT 44.7 03/02/2024   PLT 170 03/02/2024   GLUCOSE 132 (H) 05/19/2024   CHOL 213 (H) 02/11/2024   TRIG 89 02/11/2024   HDL 87 02/11/2024   LDLDIRECT 114 (H) 02/11/2024   LDLCALC 110 (H) 02/11/2024   ALT 34 05/19/2024   AST 26 05/19/2024   NA 138 05/19/2024   K 4.3 05/19/2024   CL 104 05/19/2024  CREATININE 1.05 05/19/2024   BUN 21 05/19/2024   CO2 24 05/19/2024   TSH 0.54 11/04/2023   INR 1.1 11/05/2020   HGBA1C 6.8 (H) 02/11/2024    US  Abdomen Limited RUQ (LIVER/GB) Result Date: 02/17/2024 CLINICAL DATA:  Liver enzyme elevation EXAM: ULTRASOUND ABDOMEN LIMITED RIGHT UPPER QUADRANT COMPARISON:  None Available. FINDINGS: Gallbladder: 6 mm polyp. No gallstones or wall thickening visualized. No sonographic Murphy sign noted by sonographer. Common bile duct: Diameter: 4 mm Liver: No focal lesion. Increased echotexture. Portal vein is patent on color Doppler imaging with normal direction of blood flow towards the liver. Other: None. IMPRESSION: 1. No acute abnormality identified. 2. 6 mm gallbladder polyp. Recommend follow-up ultrasound in 6 months. 3. Increased echotexture of the liver. This is a nonspecific finding but can be seen in fatty infiltration of liver. Electronically Signed   By: Craig Farr M.D.   On: 02/17/2024 10:25    Assessment & Plan:  .Hyperlipidemia associated with type 2 diabetes mellitus (HCC) Assessment & Plan: Complicated by statin myalgia.  She is Tolerating  zetia  but LDL is considerably above goal.  Will discuss adding PCSK9  inhibitor if insurance will allow.     Lab Results  Component Value Date   HGBA1C 6.8 (H) 02/11/2024   Lab Results  Component Value Date   CHOL 213 (H) 02/11/2024   HDL 87 02/11/2024   LDLCALC 110 (H) 02/11/2024   LDLDIRECT 114 (H) 02/11/2024   TRIG 89 02/11/2024   CHOLHDL 2.4 02/11/2024   Lab Results  Component Value Date   ALT 34 05/19/2024   AST 26 05/19/2024   ALKPHOS 66  05/19/2024   BILITOT 1.0 05/19/2024   No results found for: LABMICR, MICROALBUR   Lab Results  Component Value Date   CREATININE 1.05 05/19/2024      Orders: -     LDL cholesterol, direct; Future -     Lipid panel; Future  Acquired hypothyroidism -     TSH; Future  Type 2 diabetes mellitus with stage 3a chronic kidney disease, without long-term current use of insulin (HCC) Assessment & Plan: A1c has improved with careful diet.  She has no proteinuria and ho history of hypertension.  Statin intolerant.  Tolerating zetia  but LDL is not at goal.  No prior cardiac workup .  Will lkiekly require cardiac workup for insurance to cover Repatha  Lab Results  Component Value Date   HGBA1C 6.8 (H) 02/11/2024   No results found for: LABMICR, MICROALBUR    Lab Results  Component Value Date   CHOL 213 (H) 02/11/2024   HDL 87 02/11/2024   LDLCALC 110 (H) 02/11/2024   LDLDIRECT 114 (H) 02/11/2024   TRIG 89 02/11/2024   CHOLHDL 2.4 02/11/2024     Orders: -     Comprehensive metabolic panel with GFR -     Hemoglobin A1c; Future -     Microalbumin / creatinine urine ratio; Future -     Hepatitis A-Hep B Recomb Vac; Inject 1 mL into the muscle once for 1 dose. Repeat at 1 month and at 6 months  Dispense: 1 mL; Refill: 2  Polyp of gallbladder Assessment & Plan: Incidental finding on May 2025 RUQ ultrasound.  Repeat us  recommended in November    Congestion of paranasal sinus -     CBC with Differential/Platelet; Future  Fatty liver -     Hepatitis B surface antibody,qualitative -     Hepatitis A antibody, total  NASH (nonalcoholic steatohepatitis)  Assessment & Plan: Noninvasive workup indicates MASH .LFTs have normalized.  Diagnosis and management reviewed .  Recommending Hepatitis A & B vaccines   Lab Results  Component Value Date   ALT 34 05/19/2024   AST 26 05/19/2024   ALKPHOS 66 05/19/2024   BILITOT 1.0 05/19/2024     Orders: -     Hepatitis A-Hep B  Recomb Vac; Inject 1 mL into the muscle once for 1 dose. Repeat at 1 month and at 6 months  Dispense: 1 mL; Refill: 2  Pain of left thumb Assessment & Plan: Reviewed March 2025 orthopedics evaluation by Dr.  Francisco at [facility]: ;  she has advanced CMC arthritis of the left thumb with involvement of the STT joint. Findings include significant osteophytosis and extension of the MP joint. Notably, there is no significant arthritis of the MP joint.  She received an  I/a  injection  with celestone  which provided transient minimal  relief.   She is considering a second opinion     Other orders -     Levothyroxine  Sodium; TAKE ONE TABLET EVERY DAY BEFORE BREAKFAST  Dispense: 90 tablet; Refill: 1 -     Fluticasone  Propionate; Place 2 sprays into both nostrils daily.  Dispense: 16 g; Refill: 6     I spent 34 minutes on the day of this face to face encounter reviewing patient's  most recent visit with cardiology,  nephrology,  and neurology,  prior relevant surgical and non surgical procedures, recent  labs and imaging studies, counseling on weight management,  reviewing the assessment and plan with patient, and post visit ordering and reviewing of  diagnostics and therapeutics with patient  .   Follow-up: Return in about 3 months (around 08/19/2024) for follow up diabetes.   Verneita LITTIE Kettering, MD

## 2024-05-19 NOTE — Patient Instructions (Addendum)
 Ask [facility] for the HepatitisA/B vaccine   Trial  of flonase  for 2 weeks to see if right sided sinus issues resolve   Dr Camella is the hand surgeon

## 2024-05-20 LAB — HEPATITIS A ANTIBODY, TOTAL: Hepatitis A AB,Total: REACTIVE — AB

## 2024-05-20 LAB — HEPATITIS B SURFACE ANTIBODY,QUALITATIVE: Hep B S Ab: NONREACTIVE

## 2024-05-21 ENCOUNTER — Ambulatory Visit: Payer: Self-pay | Admitting: Internal Medicine

## 2024-05-21 NOTE — Assessment & Plan Note (Signed)
 Complicated by statin myalgia.  She is Tolerating  zetia  but LDL is considerably above goal.  Will discuss adding PCSK9  inhibitor if insurance will allow.     Lab Results  Component Value Date   HGBA1C 6.8 (H) 02/11/2024   Lab Results  Component Value Date   CHOL 213 (H) 02/11/2024   HDL 87 02/11/2024   LDLCALC 110 (H) 02/11/2024   LDLDIRECT 114 (H) 02/11/2024   TRIG 89 02/11/2024   CHOLHDL 2.4 02/11/2024   Lab Results  Component Value Date   ALT 34 05/19/2024   AST 26 05/19/2024   ALKPHOS 66 05/19/2024   BILITOT 1.0 05/19/2024   No results found for: LABMICR, MICROALBUR   Lab Results  Component Value Date   CREATININE 1.05 05/19/2024

## 2024-05-21 NOTE — Assessment & Plan Note (Addendum)
 Noninvasive workup indicates MASH .LFTs have normalized.  Diagnosis and management reviewed .  Recommending Hepatitis A & B vaccines   Lab Results  Component Value Date   ALT 34 05/19/2024   AST 26 05/19/2024   ALKPHOS 66 05/19/2024   BILITOT 1.0 05/19/2024

## 2024-05-21 NOTE — Assessment & Plan Note (Signed)
 A1c has improved with careful diet.  She has no proteinuria and ho history of hypertension.  Statin intolerant.  Tolerating zetia  but LDL is not at goal.  No prior cardiac workup .  Will lkiekly require cardiac workup for insurance to cover Repatha  Lab Results  Component Value Date   HGBA1C 6.8 (H) 02/11/2024   No results found for: LABMICR, MICROALBUR    Lab Results  Component Value Date   CHOL 213 (H) 02/11/2024   HDL 87 02/11/2024   LDLCALC 110 (H) 02/11/2024   LDLDIRECT 114 (H) 02/11/2024   TRIG 89 02/11/2024   CHOLHDL 2.4 02/11/2024

## 2024-05-21 NOTE — Assessment & Plan Note (Signed)
 Reviewed March 2025 orthopedics evaluation by Dr.  Francisco at [facility]: ;  she has advanced CMC arthritis of the left thumb with involvement of the STT joint. Findings include significant osteophytosis and extension of the MP joint. Notably, there is no significant arthritis of the MP joint.  She received an  I/a  injection  with celestone  which provided transient minimal  relief.   She is considering a second opinion

## 2024-05-24 ENCOUNTER — Encounter: Payer: Self-pay | Admitting: Internal Medicine

## 2024-06-01 DIAGNOSIS — M62830 Muscle spasm of back: Secondary | ICD-10-CM | POA: Diagnosis not present

## 2024-06-01 DIAGNOSIS — M546 Pain in thoracic spine: Secondary | ICD-10-CM | POA: Diagnosis not present

## 2024-06-01 DIAGNOSIS — M542 Cervicalgia: Secondary | ICD-10-CM | POA: Diagnosis not present

## 2024-06-01 DIAGNOSIS — M9903 Segmental and somatic dysfunction of lumbar region: Secondary | ICD-10-CM | POA: Diagnosis not present

## 2024-06-12 ENCOUNTER — Encounter

## 2024-06-16 ENCOUNTER — Encounter: Payer: Self-pay | Admitting: Internal Medicine

## 2024-06-19 NOTE — Telephone Encounter (Signed)
 Pt wants PA for Hep B. Can someone look into this please.

## 2024-06-21 DIAGNOSIS — L820 Inflamed seborrheic keratosis: Secondary | ICD-10-CM | POA: Diagnosis not present

## 2024-06-21 DIAGNOSIS — D485 Neoplasm of uncertain behavior of skin: Secondary | ICD-10-CM | POA: Diagnosis not present

## 2024-06-21 DIAGNOSIS — L538 Other specified erythematous conditions: Secondary | ICD-10-CM | POA: Diagnosis not present

## 2024-06-21 DIAGNOSIS — D0439 Carcinoma in situ of skin of other parts of face: Secondary | ICD-10-CM | POA: Diagnosis not present

## 2024-06-26 ENCOUNTER — Encounter: Payer: Self-pay | Admitting: Pharmacist

## 2024-06-26 NOTE — Progress Notes (Signed)
 Chart Review Reason: Drug information Question - Prior Authorization HBV Vaccine  Summary: [employer] Prior authorization (medical benefit/PartB) form filled out and uploaded to PCP eFax folder. Clinical pool notified.   PA requirement: Risk factor(s) = Elevated liver enzymes   Requested: Heplisav-B adult immunization  Next steps:  - Form uploaded to PCP eFax - Clinical pool to fax to [employer] (Fax # at top of prior auth form)   Manuelita FABIENE Kobs, PharmD Clinical Pharmacist [facility] [phone]

## 2024-06-26 NOTE — Progress Notes (Signed)
Form has been printed and faxed

## 2024-06-27 NOTE — Telephone Encounter (Signed)
 Form was printed and faxed yesterday

## 2024-06-29 DIAGNOSIS — M9903 Segmental and somatic dysfunction of lumbar region: Secondary | ICD-10-CM | POA: Diagnosis not present

## 2024-06-29 DIAGNOSIS — M542 Cervicalgia: Secondary | ICD-10-CM | POA: Diagnosis not present

## 2024-06-29 DIAGNOSIS — M546 Pain in thoracic spine: Secondary | ICD-10-CM | POA: Diagnosis not present

## 2024-06-29 DIAGNOSIS — M62830 Muscle spasm of back: Secondary | ICD-10-CM | POA: Diagnosis not present

## 2024-07-04 ENCOUNTER — Telehealth: Payer: Self-pay | Admitting: Internal Medicine

## 2024-07-04 NOTE — Telephone Encounter (Signed)
 Copied from CRM #[id number]. Topic: Clinical - Medication Refill >> Jul 04, 2024  2:45 PM Alfonso ORN wrote:   Pharmacy called on behlaf of patient getting vaccine needs insurance approval via pcp Medication: hepatitis B vaccine    Has the patient contacted their pharmacy? Yes     This is the patient's preferred pharmacy:  [facility] [address] [place] Fax number [phone]

## 2024-07-04 NOTE — Telephone Encounter (Unsigned)
 Copied from CRM #[id number]. Topic: Clinical - Medication Refill >> Jul 04, 2024  2:45 PM Alfonso ORN wrote:  Pharmacy called on behlaf of patient getting vaccine needs insurance approval via pcp Medication: hepatitis B vaccine   Has the patient contacted their pharmacy? Yes   This is the patient's preferred pharmacy:  [facility] [address] [place] Fax number [phone]  Is this the correct pharmacy for this prescription? Yes   Has the prescription been filled recently? No  Is the patient out of the medication? Yes  Has the patient been seen for an appointment in the last year OR does the patient have an upcoming appointment? Yes  Can we respond through MyChart? No  Pharmacy called

## 2024-07-06 ENCOUNTER — Ambulatory Visit
Admission: RE | Admit: 2024-07-06 | Discharge: 2024-07-06 | Disposition: A | Discharge status: Home / Self Care | Source: Ambulatory Visit | Attending: Internal Medicine | Admitting: Internal Medicine

## 2024-07-06 DIAGNOSIS — Z1231 Encounter for screening mammogram for malignant neoplasm of breast: Secondary | ICD-10-CM | POA: Diagnosis not present

## 2024-07-11 ENCOUNTER — Telehealth: Payer: Self-pay

## 2024-07-11 NOTE — Telephone Encounter (Signed)
 Last office note will need to be amended with the information requested in message below.

## 2024-07-11 NOTE — Telephone Encounter (Signed)
 Copied from CRM [id number]. Topic: General - Other >> Jul 11, 2024 11:17 AM Mercedes MATSU wrote: Reason for CRM: Rexene Hun from [facility] Called in requesting diagnosis of what the patient has going on, like what risk level she would fall in. What chronic conditions she has to make her need the hepatitis B vaccine. They are requesting the last clinical notes on the patient     CB: [phone]  Fax: [phone]

## 2024-07-12 ENCOUNTER — Telehealth: Payer: Self-pay

## 2024-07-12 MED ORDER — HEPATITIS A-HEP B RECOMB VAC 720-20 ELU-MCG/ML IM SUSY: 1.0000 mL | PREFILLED_SYRINGE | Freq: Once | INTRAMUSCULAR | 2 refills | Status: AC

## 2024-07-12 NOTE — Addendum Note (Signed)
 Addended by: MARYLYNN VERNEITA CROME on: 07/12/2024 06:43 AM   Modules accepted: Orders

## 2024-07-12 NOTE — Telephone Encounter (Signed)
 Copied from CRM #[id number]. Topic: General - Inquiry >> Jul 12, 2024 11:14 AM [name] wrote: Reason for CRM: Patient asking for Jacqueline Mata, CMA to return call.

## 2024-07-12 NOTE — Telephone Encounter (Signed)
 See previous message

## 2024-07-12 NOTE — Telephone Encounter (Signed)
 Spoke with pt to let her know that we have faxed the clinical notes and scheduled her for a lab appt.

## 2024-07-12 NOTE — Assessment & Plan Note (Signed)
 Attributed to use of meloxicam  per review of last  nephrology note Feb 2024  [facility].   GFR remains < 60 ml/min but > 45  on recent CMP.  .   Lab Results  Component Value Date   CREATININE 1.05 05/19/2024   No results found for: LABMICR, MICROALBUR

## 2024-07-17 DIAGNOSIS — L244 Irritant contact dermatitis due to drugs in contact with skin: Secondary | ICD-10-CM | POA: Diagnosis not present

## 2024-07-18 ENCOUNTER — Other Ambulatory Visit

## 2024-07-18 DIAGNOSIS — E1122 Type 2 diabetes mellitus with diabetic chronic kidney disease: Secondary | ICD-10-CM

## 2024-07-18 DIAGNOSIS — E1169 Type 2 diabetes mellitus with other specified complication: Secondary | ICD-10-CM | POA: Diagnosis not present

## 2024-07-18 DIAGNOSIS — E785 Hyperlipidemia, unspecified: Secondary | ICD-10-CM | POA: Diagnosis not present

## 2024-07-18 DIAGNOSIS — N1831 Chronic kidney disease, stage 3a: Secondary | ICD-10-CM

## 2024-07-18 LAB — LIPID PANEL
Cholesterol: 206 mg/dL — ABNORMAL HIGH (ref 0–200)
HDL: 76.5 mg/dL (ref >39.00)
LDL Cholesterol: 114 mg/dL — ABNORMAL HIGH (ref 0–99)
NonHDL: 129.2
Total CHOL/HDL Ratio: 3
Triglycerides: 76 mg/dL (ref 0.0–149.0)
VLDL: 15.2 mg/dL (ref 0.0–40.0)

## 2024-07-18 LAB — MICROALBUMIN / CREATININE URINE RATIO
Creatinine,U: 97.5 mg/dL
Microalb Creat Ratio: 7.6 mg/g (ref 0.0–30.0)
Microalb, Ur: 0.7 mg/dL (ref 0.0–1.9)

## 2024-07-19 ENCOUNTER — Ambulatory Visit: Payer: PPO | Admitting: *Deleted

## 2024-07-19 VITALS — Ht 65.0 in | Wt 161.0 lb

## 2024-07-19 DIAGNOSIS — Z0000 Encounter for general adult medical examination without abnormal findings: Secondary | ICD-10-CM | POA: Diagnosis not present

## 2024-07-19 NOTE — Progress Notes (Signed)
 Subjective:   Jacqueline Mata is a 83 y.o. who presents for a Medicare Wellness preventive visit.  As a reminder, Annual Wellness Visits don't include a physical exam, and some assessments may be limited, especially if this visit is performed virtually. We may recommend an in-person follow-up visit with your provider if needed.  Visit Complete: Virtual I connected with  Jacqueline Mata on 07/19/24 by a audio enabled telemedicine application and verified that I am speaking with the correct person using two identifiers.  Patient Location: Home  Provider Location: Home Office  I discussed the limitations of evaluation and management by telemedicine. The patient expressed understanding and agreed to proceed.  Vital Signs: Because this visit was a virtual/telehealth visit, some criteria may be missing or patient reported. Any vitals not documented were not able to be obtained and vitals that have been documented are patient reported.  VideoDeclined- This patient declined Librarian, academic. Therefore the visit was completed with audio only.  Persons Participating in Visit: Patient.  AWV Questionnaire: Yes: Patient Medicare AWV questionnaire was completed by the patient on 07/18/24; I have confirmed that all information answered by patient is correct and no changes since this date.  Cardiac Risk Factors include: advanced age (>62men, >42 women);diabetes mellitus;dyslipidemia     Objective:    Today's Vitals   07/19/24 1500  Weight: 161 lb (73 kg)  Height: 5' 5 (1.651 m)   Body mass index is 26.79 kg/m.     07/19/2024    3:13 PM 03/02/2024   10:18 AM 02/29/2024   10:34 AM 09/02/2023   10:20 AM 07/19/2023    3:24 PM 03/12/2023    8:12 AM 03/04/2023    3:21 PM  Advanced Directives  Does Patient Have a Medical Advance Directive? No No Yes Yes No Yes Yes  Type of Advance Directive   Healthcare Power of Attorney Living will  Living will Living will  Does  patient want to make changes to medical advance directive?  No - Patient declined  No - Patient declined     Copy of Healthcare Power of Attorney in Chart?       No - copy requested  Would patient like information on creating a medical advance directive? No - Patient declined    No - Patient declined      Current Medications (verified) Outpatient Encounter Medications as of 07/19/2024  Medication Sig   amoxicillin  (AMOXIL ) 500 MG capsule Take 2,000 mg by mouth See admin instructions. Dental procedures. Takes 2000mg  one hour prior to dental procedures.   ascorbic acid (VITAMIN C) 500 MG tablet Take 500 mg by mouth daily.   blood glucose meter kit and supplies Dispense based on patient and insurance preference. Use to check blood sugars one time daily. (ICD-10 E11.9).   Cholecalciferol (VITAMIN D ) 50 MCG (2000 UT) tablet Take 2,000 Units by mouth daily.   COLLAGEN PO Take 1 Scoop by mouth every other day. With Probiotic   ezetimibe  (ZETIA ) 10 MG tablet Take 1 tablet (10 mg total) by mouth daily.   fexofenadine  (ALLEGRA  ALLERGY) 60 MG tablet Take 1 tablet (60 mg total) by mouth 2 (two) times daily.   fluticasone  (FLONASE ) 50 MCG/ACT nasal spray Place 2 sprays into both nostrils daily. (Patient taking differently: Place 2 sprays into both nostrils daily as needed.)   folic acid  (FOLVITE ) 800 MCG tablet Take 800 mcg by mouth daily.   Lancets (ONETOUCH DELICA PLUS LANCET33G) MISC USE WITH STRIPS  levothyroxine  (SYNTHROID ) 88 MCG tablet TAKE ONE TABLET EVERY DAY BEFORE BREAKFAST   MAGNESIUM GLUCONATE PO Take 2 capsules by mouth at bedtime.   metroNIDAZOLE (METROCREAM) 0.75 % cream Apply 1 Application topically 2 (two) times daily.   ONETOUCH VERIO test strip USE TO CHECK BLOOD SUGARS TWICE DAILY   OVER THE COUNTER MEDICATION Bone Broth powder - alternates with collagen.   Propylene Glycol (SYSTANE BALANCE) 0.6 % SOLN Place 1 drop into both eyes daily as needed (dry eyes).   Turmeric (QC TUMERIC  COMPLEX) 500 MG CAPS Take by mouth.   vitamin B-12 (CYANOCOBALAMIN ) 1000 MCG tablet Take 1,000 mcg by mouth daily.   No facility-administered encounter medications on file as of 07/19/2024.    Allergies (verified) Codeine, Ilevro [nepafenac], Other, Maxitrol [neomycin-polymyxin-dexameth], Metformin  and related, Rosuvastatin , Augmentin  [amoxicillin -pot clavulanate], Molds & smuts, Neomycin, Neomycin-bacitracin zn-polymyx, Neomycin-polymyxin-hc, Tape, Tapentadol, Terbinafine , and Tramadol   History: Past Medical History:  Diagnosis Date   Arthritis    breast cancer 2005   bilateral   Cataract 01/04/13 and 03/01/13   Complication of anesthesia    dizziness    Hyperlipidemia    hypothyroidism    Hypothyroidism    Personal history of chemotherapy    Personal history of radiation therapy    PONV (postoperative nausea and vomiting)    Pre-diabetes    Sleep apnea    mild diagnosis no cpap   Tear of left biceps muscle 01/08/2021   Tinnitus    Past Surgical History:  Procedure Laterality Date   bilateral knee replacement s     BREAST BIOPSY Left 2005   positive   BREAST BIOPSY Right 2005   positive   BREAST LUMPECTOMY Left 2005   BREAST LUMPECTOMY Right 2005   gumm surgery      JOINT REPLACEMENT  2013   by Dr. mardee   righ tknee arthroscopy      TONSILLECTOMY     TOTAL HIP ARTHROPLASTY Right 11/12/2020   Procedure: RIGHT TOTAL HIP ARTHROPLASTY ANTERIOR APPROACH;  Surgeon: Sheril Coy, MD;  Location: [facility];  Service: Orthopedics;  Laterality: Right;   TOTAL HIP ARTHROPLASTY Left 11/25/2021   Procedure: LEFT TOTAL HIP ARTHROPLASTY ANTERIOR APPROACH;  Surgeon: Sheril Coy, MD;  Location: [facility];  Service: Orthopedics;  Laterality: Left;   Family History  Problem Relation Age of Onset   Cancer Mother 48       ovarian   Cancer Sister        lung, former tobacco   Breast cancer Maternal Grandmother    Cancer Brother 43       lung, thyroid , melanoma   Diabetes Son     Social History   Socioeconomic History   Marital status: Divorced    Spouse name: Not on file   Number of children: Not on file   Years of education: Not on file   Highest education level: Not on file  Occupational History   Not on file  Tobacco Use   Smoking status: Former    Current packs/day: 0.00    Average packs/day: 1 pack/day for 19.0 years (19.0 ttl pk-yrs)    Types: Cigarettes    Start date: 01/02/1960    Quit date: 07/22/1978    Years since quitting: 46.0   Smokeless tobacco: Never   Tobacco comments:    quit 36 years ago  Vaping Use   Vaping status: Never Used  Substance and Sexual Activity   Alcohol use: Not Currently    Comment: rare  Drug use: No   Sexual activity: Never  Other Topics Concern   Not on file  Social History Narrative   Not on file   Social Drivers of Health   Financial Resource Strain: Low Risk  (07/19/2024)   Overall Financial Resource Strain (CARDIA)    Difficulty of Paying Living Expenses: Not hard at all  Food Insecurity: No Food Insecurity (07/19/2024)   Hunger Vital Sign    Worried About Running Out of Food in the Last Year: Never true    Ran Out of Food in the Last Year: Never true  Transportation Needs: No Transportation Needs (07/19/2024)   PRAPARE - Administrator, Civil Service (Medical): No    Lack of Transportation (Non-Medical): No  Physical Activity: Insufficiently Active (07/19/2024)   Exercise Vital Sign    Days of Exercise per Week: 2 days    Minutes of Exercise per Session: 60 min  Stress: No Stress Concern Present (07/19/2024)   [employer] of Occupational Health - Occupational Stress Questionnaire    Feeling of Stress: Only a little  Social Connections: Moderately Integrated (07/19/2024)   Social Connection and Isolation Panel    Frequency of Communication with Friends and Family: More than three times a week    Frequency of Social Gatherings with Friends and Family: More than three times a  week    Attends Religious Services: More than 4 times per year    Active Member of [employer] or Organizations: Yes    Attends Engineer, structural: More than 4 times per year    Marital Status: Divorced    Tobacco Counseling Counseling given: Not Answered Tobacco comments: quit 36 years ago    Clinical Intake:  Pre-visit preparation completed: Yes  Pain : No/denies pain     BMI - recorded: 26.79 Nutritional Status: BMI 25 -29 Overweight Nutritional Risks: None Diabetes: Yes CBG done?: Yes (per patient FBS 147) CBG resulted in Enter/ Edit results?: No  Lab Results  Component Value Date   HGBA1C 6.8 (H) 02/11/2024   HGBA1C 7.2 (H) 11/04/2023   HGBA1C 6.5 (A) 05/03/2023     How often do you need to have someone help you when you read instructions, pamphlets, or other written materials from your doctor or pharmacy?: 1 - Never  Interpreter Needed?: No  Information entered by :: R. Pearlene Teat LPN   Activities of Daily Living     07/18/2024   12:22 PM  In your present state of health, do you have any difficulty performing the following activities:  Hearing? 0  Vision? 0  Difficulty concentrating or making decisions? 0  Walking or climbing stairs? 0  Dressing or bathing? 0  Doing errands, shopping? 0  Preparing Food and eating ? N  Using the Toilet? N  In the past six months, have you accidently leaked urine? Y  Do you have problems with loss of bowel control? N  Managing your Medications? N  Managing your Finances? N  Housekeeping or managing your Housekeeping? N    Patient Care Team: Marylynn Verneita CROME, MD as PCP - General (Internal Medicine) Mardee Lynwood SQUIBB, MD as Consulting Physician (Orthopedic Surgery) Isenstein, Arin L, MD (Dermatology) Timmy Maude SAUNDERS, MD as Consulting Physician (Oncology)  I have updated your Care Teams any recent Medical Services you may have received from other providers in the past year.     Assessment:   This is a routine  wellness examination for Jacqueline Mata.  Hearing/Vision screen Hearing Screening -  Comments:: No issues Vision Screening - Comments:: glasses   Goals Addressed             This Visit's Progress    Patient Stated       Wants to continue to go to the gym and stay active        Depression Screen     07/19/2024    3:09 PM 05/19/2024    9:34 AM 02/11/2024    2:09 PM 11/04/2023    8:43 AM 07/19/2023    3:19 PM 06/04/2023   10:48 AM 05/03/2023    2:04 PM  PHQ 2/9 Scores  PHQ - 2 Score 0 1 0 0 0 2 2  PHQ- 9 Score 1 4   0 8 5    Fall Risk     07/18/2024   12:22 PM 05/19/2024    9:34 AM 02/11/2024    2:09 PM 11/04/2023    8:43 AM 07/15/2023    5:46 PM  Fall Risk   Falls in the past year? 0 0 0 0 0  Number falls in past yr: 0 0 0 0 0  Injury with Fall? 0 0 0 0 0  Risk for fall due to : No Fall Risks No Fall Risks No Fall Risks No Fall Risks No Fall Risks  Follow up Falls evaluation completed;Falls prevention discussed Falls evaluation completed Falls evaluation completed Falls evaluation completed Falls prevention discussed;Falls evaluation completed    MEDICARE RISK AT HOME:  Medicare Risk at Home Any stairs in or around the home?: (Patient-Rptd) Yes If so, are there any without handrails?: (Patient-Rptd) No Home free of loose throw rugs in walkways, pet beds, electrical cords, etc?: (Patient-Rptd) Yes Adequate lighting in your home to reduce risk of falls?: (Patient-Rptd) Yes Life alert?: (Patient-Rptd) No Use of a cane, walker or w/c?: (Patient-Rptd) No Grab bars in the bathroom?: (Patient-Rptd) Yes Shower chair or bench in shower?: (Patient-Rptd) Yes Elevated toilet seat or a handicapped toilet?: (Patient-Rptd) Yes  TIMED UP AND GO:  Was the test performed?  No  Cognitive Function: 6CIT completed    12/09/2015    1:34 PM  MMSE - Mini Mental State Exam  Orientation to time 5   Orientation to Place 5   Registration 3   Attention/ Calculation 5   Recall 3   Language- name  2 objects 2   Language- repeat 1  Language- follow 3 step command 3   Language- read & follow direction 1   Write a sentence 1   Copy design 1   Total score 30      Data saved with a previous flowsheet row definition        07/19/2024    3:13 PM 07/19/2023    3:25 PM 07/14/2022    4:09 PM 07/10/2020   11:26 AM 06/30/2019   11:26 AM  6CIT Screen  What Year? 0 points 0 points 0 points 0 points 0 points  What month? 0 points 0 points 0 points 0 points 0 points  What time? 0 points 0 points 0 points  0 points  Count back from 20 0 points 0 points 0 points  0 points  Months in reverse 0 points 0 points 0 points 0 points 0 points  Repeat phrase 0 points 2 points 0 points  0 points  Total Score 0 points 2 points 0 points  0 points    Immunizations Immunization History  Administered Date(s) Administered   Fluad Quad(high Dose 65+) 07/10/2021,  07/26/2023   Hep A / Hep B 07/12/2024   INFLUENZA, HIGH DOSE SEASONAL PF 07/01/2017, 06/30/2018, 06/19/2019   Influenza,inj,Quad PF,6+ Mos 07/28/2013, 07/18/2014   Influenza-Unspecified 07/04/2015, 07/30/2016, 06/25/2020, 08/12/2022   PFIZER(Purple Top)SARS-COV-2 Vaccination 10/05/2019, 10/26/2019, 07/05/2020, 01/10/2021   Pfizer Covid-19 Vaccine Bivalent Booster 72yrs & up 07/14/2021   Pneumococcal Conjugate-13 05/21/2014   Pneumococcal Polysaccharide-23 09/29/2003, 02/12/2012   Respiratory Syncytial Virus Vaccine,Recomb Aduvanted(Arexvy) 08/13/2023   Tdap 08/01/2012, 10/28/2022   Zoster Recombinant(Shingrix) 07/26/2018, 12/02/2018, 06/22/2019   Zoster, Live 09/28/2009    Screening Tests Health Maintenance  Topic Date Due   Medicare Annual Wellness (AWV)  07/18/2024   Influenza Vaccine  12/26/2024 (Originally 04/28/2024)   HEMOGLOBIN A1C  08/13/2024   FOOT EXAM  02/10/2025   OPHTHALMOLOGY EXAM  02/14/2025   Diabetic kidney evaluation - eGFR measurement  05/19/2025   Mammogram  07/06/2025   Diabetic kidney evaluation - Urine ACR   07/18/2025   DTaP/Tdap/Td (3 - Td or Tdap) 10/28/2032   Pneumococcal Vaccine: 50+ Years  Completed   DEXA SCAN  Completed   Zoster Vaccines- Shingrix  Completed   Meningococcal B Vaccine  Aged Out   Hepatitis B Vaccines 19-59 Average Risk  Discontinued   COVID-19 Vaccine  Discontinued    Health Maintenance Items Addressed: Discussed the need to update flu vaccine. Patient wants to wait and talk with PCP at her next visit about a Dexa and would like to get on the same schedule as her mammogram.   Additional Screening:  Vision Screening: Recommended annual ophthalmology exams for early detection of glaucoma and other disorders of the eye. Is the patient up to date with their annual eye exam?  Yes  Who is the provider or what is the name of the office in which the patient attends annual eye exams?  [facility]  Dental Screening: Recommended annual dental exams for proper oral hygiene  Community Resource Referral / Chronic Care Management: CRR required this visit?  No   CCM required this visit?  No   Plan:    I have personally reviewed and noted the following in the patient's chart:   Medical and social history Use of alcohol, tobacco or illicit drugs  Current medications and supplements including opioid prescriptions. Patient is not currently taking opioid prescriptions. Functional ability and status Nutritional status Physical activity Advanced directives List of other physicians Hospitalizations, surgeries, and ER visits in previous 12 months Vitals Screenings to include cognitive, depression, and falls Referrals and appointments  In addition, I have reviewed and discussed with patient certain preventive protocols, quality metrics, and best practice recommendations. A written personalized care plan for preventive services as well as general preventive health recommendations were provided to patient.   Angeline Fredericks, LPN   [id number]   After Visit Summary: (MyChart)  Due to this being a telephonic visit, the after visit summary with patients personalized plan was offered to patient via MyChart   Notes: Nothing significant to report at this time.

## 2024-07-19 NOTE — Patient Instructions (Signed)
 [name],  Thank you for taking the time for your Medicare Wellness Visit. I appreciate your continued commitment to your health goals. Please review the care plan we discussed, and feel free to reach out if I can assist you further.  Medicare recommends these wellness visits once per year to help you and your care team stay ahead of potential health issues. These visits are designed to focus on prevention, allowing your provider to concentrate on managing your acute and chronic conditions during your regular appointments.  Please note that Annual Wellness Visits do not include a physical exam. Some assessments may be limited, especially if the visit was conducted virtually. If needed, we may recommend a separate in-person follow-up with your provider.  Ongoing Care Seeing your primary care provider every 3 to 6 months helps us  monitor your health and provide consistent, personalized care.  Remember to update your flu vaccine.  Referrals If a referral was made during today's visit and you haven't received any updates within two weeks, please contact the referred provider directly to check on the status.  Recommended Screenings:  Health Maintenance  Topic Date Due   Flu Shot  12/26/2024*   Hemoglobin A1C  08/13/2024   Complete foot exam   02/10/2025   Eye exam for diabetics  02/14/2025   Yearly kidney function blood test for diabetes  05/19/2025   Breast Cancer Screening  07/06/2025   Yearly kidney health urinalysis for diabetes  07/18/2025   Medicare Annual Wellness Visit  07/19/2025   DTaP/Tdap/Td vaccine (3 - Td or Tdap) 10/28/2032   Pneumococcal Vaccine for age over 61  Completed   DEXA scan (bone density measurement)  Completed   Zoster (Shingles) Vaccine  Completed   Meningitis B Vaccine  Aged Out   Hepatitis B Vaccine  Discontinued   COVID-19 Vaccine  Discontinued  *Topic was postponed. The date shown is not the original due date.       07/19/2024    3:13 PM   Advanced Directives  Does Patient Have a Medical Advance Directive? No  Would patient like information on creating a medical advance directive? No - Patient declined   Advance Care Planning is important because it: Ensures you receive medical care that aligns with your values, goals, and preferences. Provides guidance to your family and loved ones, reducing the emotional burden of decision-making during critical moments.  Vision: Annual vision screenings are recommended for early detection of glaucoma, cataracts, and diabetic retinopathy. These exams can also reveal signs of chronic conditions such as diabetes and high blood pressure.  Dental: Annual dental screenings help detect early signs of oral cancer, gum disease, and other conditions linked to overall health, including heart disease and diabetes.  Please see the attached documents for additional preventive care recommendations.

## 2024-07-27 DIAGNOSIS — M62830 Muscle spasm of back: Secondary | ICD-10-CM | POA: Diagnosis not present

## 2024-07-27 DIAGNOSIS — M546 Pain in thoracic spine: Secondary | ICD-10-CM | POA: Diagnosis not present

## 2024-07-27 DIAGNOSIS — M9903 Segmental and somatic dysfunction of lumbar region: Secondary | ICD-10-CM | POA: Diagnosis not present

## 2024-07-27 DIAGNOSIS — M542 Cervicalgia: Secondary | ICD-10-CM | POA: Diagnosis not present

## 2024-08-11 ENCOUNTER — Other Ambulatory Visit: Payer: Self-pay | Admitting: Internal Medicine

## 2024-08-17 DIAGNOSIS — M9903 Segmental and somatic dysfunction of lumbar region: Secondary | ICD-10-CM | POA: Diagnosis not present

## 2024-08-17 DIAGNOSIS — M62830 Muscle spasm of back: Secondary | ICD-10-CM | POA: Diagnosis not present

## 2024-08-17 DIAGNOSIS — M546 Pain in thoracic spine: Secondary | ICD-10-CM | POA: Diagnosis not present

## 2024-08-17 DIAGNOSIS — M542 Cervicalgia: Secondary | ICD-10-CM | POA: Diagnosis not present

## 2024-08-29 ENCOUNTER — Encounter: Payer: Self-pay | Admitting: Internal Medicine

## 2024-08-29 DIAGNOSIS — R42 Dizziness and giddiness: Secondary | ICD-10-CM

## 2024-08-29 NOTE — Telephone Encounter (Signed)
 Is it okay to add a vitamin b12 lab to her future labs already ordered?

## 2024-08-30 DIAGNOSIS — D0439 Carcinoma in situ of skin of other parts of face: Secondary | ICD-10-CM | POA: Diagnosis not present

## 2024-08-30 DIAGNOSIS — D485 Neoplasm of uncertain behavior of skin: Secondary | ICD-10-CM | POA: Diagnosis not present

## 2024-08-31 ENCOUNTER — Ambulatory Visit: Admitting: Hematology & Oncology

## 2024-08-31 ENCOUNTER — Other Ambulatory Visit

## 2024-09-06 ENCOUNTER — Telehealth: Payer: Self-pay | Admitting: Internal Medicine

## 2024-09-06 ENCOUNTER — Encounter: Payer: Self-pay | Admitting: Internal Medicine

## 2024-09-06 ENCOUNTER — Other Ambulatory Visit

## 2024-09-06 ENCOUNTER — Ambulatory Visit: Admitting: Internal Medicine

## 2024-09-06 VITALS — BP 120/66 | HR 88 | Ht 65.0 in | Wt 164.6 lb

## 2024-09-06 DIAGNOSIS — H8193 Unspecified disorder of vestibular function, bilateral: Secondary | ICD-10-CM

## 2024-09-06 DIAGNOSIS — E1169 Type 2 diabetes mellitus with other specified complication: Secondary | ICD-10-CM

## 2024-09-06 DIAGNOSIS — E1122 Type 2 diabetes mellitus with diabetic chronic kidney disease: Secondary | ICD-10-CM

## 2024-09-06 DIAGNOSIS — E039 Hypothyroidism, unspecified: Secondary | ICD-10-CM

## 2024-09-06 DIAGNOSIS — N1831 Chronic kidney disease, stage 3a: Secondary | ICD-10-CM

## 2024-09-06 DIAGNOSIS — E785 Hyperlipidemia, unspecified: Secondary | ICD-10-CM

## 2024-09-06 DIAGNOSIS — Z780 Asymptomatic menopausal state: Secondary | ICD-10-CM

## 2024-09-06 DIAGNOSIS — R0981 Nasal congestion: Secondary | ICD-10-CM

## 2024-09-06 DIAGNOSIS — R42 Dizziness and giddiness: Secondary | ICD-10-CM

## 2024-09-06 LAB — CBC WITH DIFFERENTIAL/PLATELET
Basophils Absolute: 0.1 K/uL (ref 0.0–0.1)
Basophils Relative: 1.1 % (ref 0.0–3.0)
Eosinophils Absolute: 0.3 K/uL (ref 0.0–0.7)
Eosinophils Relative: 4.5 % (ref 0.0–5.0)
HCT: 41.5 % (ref 36.0–46.0)
Hemoglobin: 14.2 g/dL (ref 12.0–15.0)
Lymphocytes Relative: 30 % (ref 12.0–46.0)
Lymphs Abs: 1.8 K/uL (ref 0.7–4.0)
MCHC: 34.3 g/dL (ref 30.0–36.0)
MCV: 89.5 fl (ref 78.0–100.0)
Monocytes Absolute: 0.3 K/uL (ref 0.1–1.0)
Monocytes Relative: 5.6 % (ref 3.0–12.0)
Neutro Abs: 3.5 K/uL (ref 1.4–7.7)
Neutrophils Relative %: 58.8 % (ref 43.0–77.0)
Platelets: 156 K/uL (ref 150.0–400.0)
RBC: 4.63 Mil/uL (ref 3.87–5.11)
RDW: 13.6 % (ref 11.5–15.5)
WBC: 6 K/uL (ref 4.0–10.5)

## 2024-09-06 LAB — B12 AND FOLATE PANEL
Folate: >23.7 ng/mL (ref >5.9)
Vitamin B-12: 1468 pg/mL — ABNORMAL HIGH (ref 211–911)

## 2024-09-06 LAB — LDL CHOLESTEROL, DIRECT: Direct LDL: 121 mg/dL

## 2024-09-06 LAB — TSH: TSH: 0.45 u[IU]/mL (ref 0.35–5.50)

## 2024-09-06 LAB — HEMOGLOBIN A1C: Hgb A1c MFr Bld: 6.7 % — ABNORMAL HIGH (ref 4.6–6.5)

## 2024-09-06 MED ORDER — BLOOD GLUCOSE MONITORING SUPPL DEVI: 1.0000 | 0 refills | Status: AC

## 2024-09-06 MED ORDER — LANCET DEVICE MISC: 1.0000 | 0 refills | Status: AC

## 2024-09-06 MED ORDER — BLOOD GLUCOSE TEST VI STRP: 1.0000 | ORAL_STRIP | 0 refills | Status: AC

## 2024-09-06 MED ORDER — BLOOD GLUCOSE METER KIT: PACK | 0 refills | Status: DC

## 2024-09-06 MED ORDER — LANCETS MISC: 1.0000 | 0 refills | Status: AC

## 2024-09-06 NOTE — Assessment & Plan Note (Signed)
 Thyroid  function is on the active side of normal ;  she has no symptoms of overactive thyroid    Lab Results  Component Value Date   TSH 0.45 09/06/2024

## 2024-09-06 NOTE — Telephone Encounter (Signed)
 Pt forgot to ask at visit today, but is very much in need of a new OneTouch Verio Flex Monitor. She dropped hers and it cracked. Pt states since then has been unreliable and she's having to use old supplies. Please reach out to pt.

## 2024-09-06 NOTE — Patient Instructions (Addendum)
 Your diabetes remains under excellent control  and your b12 level is excellent!   We will recheck your kidney function, your A1c and other labs  on or after March 11,   and you can schedule an OV with me on march 12

## 2024-09-06 NOTE — Assessment & Plan Note (Signed)
 Attributed to use of meloxicam  per review of last  nephrology note Feb 2024  [facility].   GFR remains < 60 ml/min but stabel and > 45  on August  CMP.  .   Lab Results  Component Value Date   CREATININE 1.05 05/19/2024   Lab Results  Component Value Date   MICROALBUR 0.7 07/18/2024

## 2024-09-06 NOTE — Addendum Note (Signed)
 Addended by: HARRIETTE RAISIN on: 09/06/2024 03:47 PM   Modules accepted: Orders

## 2024-09-06 NOTE — Progress Notes (Signed)
 Subjective:  Patient ID: Jacqueline Mata, female    DOB: 11/12/1940  Age: 83 y.o. MRN: [id number]  CC: The primary encounter diagnosis was Acquired hypothyroidism. Diagnoses of CKD stage 3a, GFR 45-59 ml/min (HCC), Type 2 diabetes mellitus with stage 3a chronic kidney disease, without long-term current use of insulin (HCC), Postmenopausal estrogen deficiency, Balance problem due to vestibular dysfunction of both ears, and Hyperlipidemia associated with type 2 diabetes mellitus (HCC) were also pertinent to this visit.   HPI Jacqueline Mata presents for  Chief Complaint  Patient presents with   Medical Management of Chronic Issues   1) type 2 DM:    She  feels generally well,  But is not  exercising regularly or trying to lose weight. Checking  blood sugars less than once daily at variable times, usually only if she feels she may be having a hypoglycemic event. .  BS have been under 130 fasting and < 150 post prandially.  Denies any recent hypoglyemic events.  Taking   medications as directed. Following a carbohydrate modified diet 6 days per week. Denies numbness, burning and tingling of extremities. Appetite is good.     2) loss of balance:  she feels that her balance is getting worse.  Not exercising as regularly as she used to .since her knee replacement.  No longer taking Tai Chi which she felt helped  3) Anxiety to the point of confusion: improved with reduction in caffeine . Worried about her short term memory.  Feels she is forgetting more names.  How to spell words.   No kitchen accidents,  no trouble remembering medications and doctors   appt.  Uses GPS when driving .  Afraid to drive on the highway . Worried aboyt living situation as she ages,  has looked into some options but no plan yet.   Has longterm care insurance  Discussed [facility] referral to put her in touch with social workers   4) CKD: she has not seen nephrology since 2024 and meloxicam  was stopped ,  review of recent labs  note an improvement  in GFR   Outpatient Medications Prior to Visit  Medication Sig Dispense Refill   amoxicillin  (AMOXIL ) 500 MG capsule Take 2,000 mg by mouth See admin instructions. Dental procedures. Takes 2000mg  one hour prior to dental procedures.     ascorbic acid (VITAMIN C) 500 MG tablet Take 500 mg by mouth daily.     Cholecalciferol (VITAMIN D ) 50 MCG (2000 UT) tablet Take 2,000 Units by mouth daily.     COLLAGEN PO Take 1 Scoop by mouth every other day. With Probiotic     ezetimibe  (ZETIA ) 10 MG tablet TAKE 1 TABLET BY MOUTH DAILY 90 tablet 3   fexofenadine  (ALLEGRA  ALLERGY) 60 MG tablet Take 1 tablet (60 mg total) by mouth 2 (two) times daily. 180 tablet 1   folic acid  (FOLVITE ) 800 MCG tablet Take 800 mcg by mouth daily.     Lancets (ONETOUCH DELICA PLUS LANCET33G) MISC USE WITH STRIPS 100 each 3   levothyroxine  (SYNTHROID ) 88 MCG tablet TAKE ONE TABLET EVERY DAY BEFORE BREAKFAST 90 tablet 1   MAGNESIUM GLUCONATE PO Take 2 capsules by mouth at bedtime.     metroNIDAZOLE (METROCREAM) 0.75 % cream Apply 1 Application topically 2 (two) times daily.     ONETOUCH VERIO test strip USE TO CHECK BLOOD SUGARS TWICE DAILY 100 each 12   OVER THE COUNTER MEDICATION Bone Broth powder - alternates with collagen.  Propylene Glycol (SYSTANE BALANCE) 0.6 % SOLN Place 1 drop into both eyes daily as needed (dry eyes).     Turmeric (QC TUMERIC COMPLEX) 500 MG CAPS Take by mouth.     vitamin B-12 (CYANOCOBALAMIN ) 1000 MCG tablet Take 1,000 mcg by mouth daily.     blood glucose meter kit and supplies Dispense based on patient and insurance preference. Use to check blood sugars one time daily. (ICD-10 E11.9). 1 each 0   fluticasone  (FLONASE ) 50 MCG/ACT nasal spray Place 2 sprays into both nostrils daily. (Patient taking differently: Place 2 sprays into both nostrils daily as needed.) 16 g 6   No facility-administered medications prior to visit.    Review of Systems;  Patient denies headache,  fevers, malaise, unintentional weight loss, skin rash, eye pain, sinus congestion and sinus pain, sore throat, dysphagia,  hemoptysis , cough, dyspnea, wheezing, chest pain, palpitations, orthopnea, edema, abdominal pain, nausea, melena, diarrhea, constipation, flank pain, dysuria, hematuria, urinary  Frequency, nocturia, numbness, tingling, seizures,  Focal weakness, Loss of consciousness,  Tremor, insomnia, depression, anxiety, and suicidal ideation.      Objective:  BP 120/66   Pulse 88   Ht 5' 5 (1.651 m)   Wt 164 lb 9.3 oz (74.7 kg)   SpO2 97%   BMI 27.39 kg/m   BP Readings from Last 3 Encounters:  09/06/24 120/66  05/19/24 110/66  03/02/24 139/84    Wt Readings from Last 3 Encounters:  09/06/24 164 lb 9.3 oz (74.7 kg)  07/19/24 161 lb (73 kg)  05/19/24 164 lb 9.6 oz (74.7 kg)    Physical Exam Vitals reviewed.  Constitutional:      General: She is not in acute distress.    Appearance: Normal appearance. She is normal weight. She is not ill-appearing, toxic-appearing or diaphoretic.  HENT:     Head: Normocephalic.  Eyes:     General: No scleral icterus.       Right eye: No discharge.        Left eye: No discharge.     Conjunctiva/sclera: Conjunctivae normal.  Cardiovascular:     Rate and Rhythm: Normal rate and regular rhythm.     Heart sounds: Normal heart sounds.  Pulmonary:     Effort: Pulmonary effort is normal. No respiratory distress.     Breath sounds: Normal breath sounds.  Musculoskeletal:        General: Normal range of motion.  Skin:    General: Skin is warm and dry.  Neurological:     General: No focal deficit present.     Mental Status: She is alert and oriented to person, place, and time. Mental status is at baseline.     Motor: Weakness present.     Comments: Trouble rising fro seated position without assistance/use of arms   Psychiatric:        Mood and Affect: Mood normal.        Behavior: Behavior normal.        Thought Content: Thought  content normal.        Judgment: Judgment normal.     Lab Results  Component Value Date   HGBA1C 6.7 (H) 09/06/2024   HGBA1C 6.8 (H) 02/11/2024   HGBA1C 7.2 (H) 11/04/2023    Lab Results  Component Value Date   CREATININE 1.05 05/19/2024   CREATININE 1.19 (H) 03/02/2024   CREATININE 1.01 (H) 02/11/2024    Lab Results  Component Value Date   WBC 6.0 09/06/2024   HGB 14.2  09/06/2024   HCT 41.5 09/06/2024   PLT 156.0 09/06/2024   GLUCOSE 132 (H) 05/19/2024   CHOL 206 (H) 07/18/2024   TRIG 76.0 07/18/2024   HDL 76.50 07/18/2024   LDLDIRECT 121.0 09/06/2024   LDLCALC 114 (H) 07/18/2024   ALT 34 05/19/2024   AST 26 05/19/2024   NA 138 05/19/2024   K 4.3 05/19/2024   CL 104 05/19/2024   CREATININE 1.05 05/19/2024   BUN 21 05/19/2024   CO2 24 05/19/2024   TSH 0.45 09/06/2024   INR 1.1 11/05/2020   HGBA1C 6.7 (H) 09/06/2024   MICROALBUR 0.7 07/18/2024    MM 3D SCREENING MAMMOGRAM BILATERAL BREAST Result Date: 07/10/2024 CLINICAL DATA:  Screening. EXAM: DIGITAL SCREENING BILATERAL MAMMOGRAM WITH TOMOSYNTHESIS AND CAD TECHNIQUE: Bilateral screening digital craniocaudal and mediolateral oblique mammograms were obtained. Bilateral screening digital breast tomosynthesis was performed. The images were evaluated with computer-aided detection. COMPARISON:  Previous exam(s). ACR Breast Density Category b: There are scattered areas of fibroglandular density. FINDINGS: There are no findings suspicious for malignancy. IMPRESSION: No mammographic evidence of malignancy. A result letter of this screening mammogram will be mailed directly to the patient. RECOMMENDATION: Screening mammogram in one year. (Code:[id number]) BI-RADS CATEGORY  1: Negative. Electronically Signed   By: Inocente Ast M.D.   On: 07/10/2024 13:38    Assessment & Plan:  .Acquired hypothyroidism Assessment & Plan: Thyroid  function is on the active side of normal ;  she has no symptoms of overactive thyroid    Lab  Results  Component Value Date   TSH 0.45 09/06/2024      CKD stage 3a, GFR 45-59 ml/min [facility]) Assessment & Plan: Attributed to use of meloxicam  per review of last  nephrology note Feb 2024  [facility].   GFR remains < 60 ml/min but stabel and > 45  on August  CMP.  .   Lab Results  Component Value Date   CREATININE 1.05 05/19/2024   Lab Results  Component Value Date   MICROALBUR 0.7 07/18/2024        Type 2 diabetes mellitus with stage 3a chronic kidney disease, without long-term current use of insulin (HCC) Assessment & Plan: A1c has improved with careful diet.  She has no proteinuria and no history of hypertension.  She is Statin intolerant.  Tolerating zetia  but LDL is well above 70b.  No prior cardiac workup .  Will l ikely require cardiac workup for insurance to cover Repatha  Lab Results  Component Value Date   HGBA1C 6.7 (H) 09/06/2024   Lab Results  Component Value Date   MICROALBUR 0.7 07/18/2024      Lab Results  Component Value Date   CHOL 206 (H) 07/18/2024   HDL 76.50 07/18/2024   LDLCALC 114 (H) 07/18/2024   LDLDIRECT 121.0 09/06/2024   TRIG 76.0 07/18/2024   CHOLHDL 3 07/18/2024     Orders: -     Hemoglobin A1c; Future -     Comprehensive metabolic panel with GFR; Future -     Lipid panel; Future  Postmenopausal estrogen deficiency -     DG Bone Density; Future  Balance problem due to vestibular dysfunction of both ears Assessment & Plan: Encouraged to resume walking, Tai Chi. Reassured that her b12 was not the cause    Hyperlipidemia associated with type 2 diabetes mellitus (HCC) Assessment & Plan: Complicated by statin myalgia.  She is Tolerating  zetia  but LDL is considerably above goal.  Will discuss adding PCSK9  inhibitor if insurance  will allow.    Lab Results  Component Value Date   HGBA1C 6.7 (H) 09/06/2024   Lab Results  Component Value Date   CHOL 206 (H) 07/18/2024   HDL 76.50 07/18/2024   LDLCALC 114 (H) 07/18/2024    LDLDIRECT 121.0 09/06/2024   TRIG 76.0 07/18/2024   CHOLHDL 3 07/18/2024   Lab Results  Component Value Date   ALT 34 05/19/2024   AST 26 05/19/2024   ALKPHOS 66 05/19/2024   BILITOT 1.0 05/19/2024   Lab Results  Component Value Date   MICROALBUR 0.7 07/18/2024     Lab Results  Component Value Date   CREATININE 1.05 05/19/2024          I spent 34 minutes on the day of this face to face encounter reviewing patient's  most recent visit with nephrology and orthopedics,  relevant surgical and non surgical procedures, recent  labs and imaging studies, counseling on weight management,  reviewing the assessment and plan with patient, and post visit ordering and reviewing of  diagnostics and therapeutics with patient  .   Follow-up: Return in about 3 months (around 12/06/2024).   Jacqueline LITTIE Kettering, MD

## 2024-09-06 NOTE — Telephone Encounter (Signed)
 Rx has been sent and pt is aware

## 2024-09-06 NOTE — Assessment & Plan Note (Signed)
 A1c has improved with careful diet.  She has no proteinuria and no history of hypertension.  She is Statin intolerant.  Tolerating zetia  but LDL is well above 70b.  No prior cardiac workup .  Will l ikely require cardiac workup for insurance to cover Repatha  Lab Results  Component Value Date   HGBA1C 6.7 (H) 09/06/2024   Lab Results  Component Value Date   MICROALBUR 0.7 07/18/2024      Lab Results  Component Value Date   CHOL 206 (H) 07/18/2024   HDL 76.50 07/18/2024   LDLCALC 114 (H) 07/18/2024   LDLDIRECT 121.0 09/06/2024   TRIG 76.0 07/18/2024   CHOLHDL 3 07/18/2024

## 2024-09-07 NOTE — Assessment & Plan Note (Signed)
 Complicated by statin myalgia.  She is Tolerating  zetia  but LDL is considerably above goal.  Will discuss adding PCSK9  inhibitor if insurance will allow.    Lab Results  Component Value Date   HGBA1C 6.7 (H) 09/06/2024   Lab Results  Component Value Date   CHOL 206 (H) 07/18/2024   HDL 76.50 07/18/2024   LDLCALC 114 (H) 07/18/2024   LDLDIRECT 121.0 09/06/2024   TRIG 76.0 07/18/2024   CHOLHDL 3 07/18/2024   Lab Results  Component Value Date   ALT 34 05/19/2024   AST 26 05/19/2024   ALKPHOS 66 05/19/2024   BILITOT 1.0 05/19/2024   Lab Results  Component Value Date   MICROALBUR 0.7 07/18/2024     Lab Results  Component Value Date   CREATININE 1.05 05/19/2024

## 2024-09-07 NOTE — Assessment & Plan Note (Signed)
 Encouraged to resume walking, Tai Chi. Reassured that her b12 was not the cause

## 2024-09-08 ENCOUNTER — Ambulatory Visit: Payer: Self-pay | Admitting: Hematology & Oncology

## 2024-09-08 ENCOUNTER — Other Ambulatory Visit: Payer: Self-pay

## 2024-09-08 ENCOUNTER — Other Ambulatory Visit: Payer: Self-pay | Admitting: Internal Medicine

## 2024-09-08 ENCOUNTER — Inpatient Hospital Stay: Attending: Hematology & Oncology

## 2024-09-08 ENCOUNTER — Inpatient Hospital Stay: Admitting: Hematology & Oncology

## 2024-09-08 ENCOUNTER — Encounter: Payer: Self-pay | Admitting: Hematology & Oncology

## 2024-09-08 VITALS — BP 111/66 | HR 87 | Temp 97.7°F | Resp 17 | Ht 65.0 in | Wt 165.0 lb

## 2024-09-08 DIAGNOSIS — C50919 Malignant neoplasm of unspecified site of unspecified female breast: Secondary | ICD-10-CM | POA: Diagnosis not present

## 2024-09-08 DIAGNOSIS — Z170 Estrogen receptor positive status [ER+]: Secondary | ICD-10-CM

## 2024-09-08 DIAGNOSIS — E1169 Type 2 diabetes mellitus with other specified complication: Secondary | ICD-10-CM

## 2024-09-08 DIAGNOSIS — Z853 Personal history of malignant neoplasm of breast: Secondary | ICD-10-CM | POA: Insufficient documentation

## 2024-09-08 DIAGNOSIS — E039 Hypothyroidism, unspecified: Secondary | ICD-10-CM

## 2024-09-08 DIAGNOSIS — Z08 Encounter for follow-up examination after completed treatment for malignant neoplasm: Secondary | ICD-10-CM | POA: Diagnosis present

## 2024-09-08 DIAGNOSIS — E119 Type 2 diabetes mellitus without complications: Secondary | ICD-10-CM | POA: Insufficient documentation

## 2024-09-08 LAB — CBC WITH DIFFERENTIAL (CANCER CENTER ONLY)
Abs Immature Granulocytes: 0.03 K/uL (ref 0.00–0.07)
Basophils Absolute: 0.1 K/uL (ref 0.0–0.1)
Basophils Relative: 1 %
Eosinophils Absolute: 0.3 K/uL (ref 0.0–0.5)
Eosinophils Relative: 4 %
HCT: 40.9 % (ref 36.0–46.0)
Hemoglobin: 13.9 g/dL (ref 12.0–15.0)
Immature Granulocytes: 0 %
Lymphocytes Relative: 26 %
Lymphs Abs: 2.2 K/uL (ref 0.7–4.0)
MCH: 30.3 pg (ref 26.0–34.0)
MCHC: 34 g/dL (ref 30.0–36.0)
MCV: 89.3 fL (ref 80.0–100.0)
Monocytes Absolute: 0.4 K/uL (ref 0.1–1.0)
Monocytes Relative: 5 %
Neutro Abs: 5.4 K/uL (ref 1.7–7.7)
Neutrophils Relative %: 64 %
Platelet Count: 152 K/uL (ref 150–400)
RBC: 4.58 MIL/uL (ref 3.87–5.11)
RDW: 13.1 % (ref 11.5–15.5)
WBC Count: 8.4 K/uL (ref 4.0–10.5)
nRBC: 0 % (ref 0.0–0.2)

## 2024-09-08 LAB — CMP (CANCER CENTER ONLY)
ALT: 37 U/L (ref 0–44)
AST: 30 U/L (ref 15–41)
Albumin: 4.4 g/dL (ref 3.5–5.0)
Alkaline Phosphatase: 64 U/L (ref 38–126)
Anion gap: 13 (ref 5–15)
BUN: 30 mg/dL — ABNORMAL HIGH (ref 8–23)
CO2: 23 mmol/L (ref 22–32)
Calcium: 10 mg/dL (ref 8.9–10.3)
Chloride: 103 mmol/L (ref 98–111)
Creatinine: 0.96 mg/dL (ref 0.44–1.00)
GFR, Estimated: 59 mL/min — ABNORMAL LOW (ref >60)
Glucose, Bld: 158 mg/dL — ABNORMAL HIGH (ref 70–99)
Potassium: 4.4 mmol/L (ref 3.5–5.1)
Sodium: 138 mmol/L (ref 135–145)
Total Bilirubin: 0.7 mg/dL (ref 0.0–1.2)
Total Protein: 7 g/dL (ref 6.5–8.1)

## 2024-09-08 LAB — HEMOGLOBIN A1C
Hgb A1c MFr Bld: 6.4 % — ABNORMAL HIGH (ref 4.8–5.6)
Mean Plasma Glucose: 136.98 mg/dL

## 2024-09-08 LAB — LACTATE DEHYDROGENASE: LDH: 277 U/L — ABNORMAL HIGH (ref 105–235)

## 2024-09-08 NOTE — Progress Notes (Signed)
 Hematology and Oncology Follow Up Visit  Jacqueline Mata [id number] October 18, 1940 83 y.o. 09/08/2024   Principle Diagnosis:  Synchronous bilateral stage IIA (T2 N0 M0) ductal carcinoma of bilateral breast - ER+/HER2-.  Current Therapy:   Observation     Interim History:  Ms.  Mata is comes in for followup.  We see her every 6 months.  She is doing pretty well.  She is not working out as much right now.  I think this is because of some shoulder issues.  She has had shoulder issues in the past.  She had a nice Thanksgiving.  She hopefully will have Christmas with a friend.  She has had no cough or shortness of breath.  She has had no nausea or vomiting.  She has had no bleeding.  There is been no rashes.  She has had no leg swelling.  She had a mammogram that was done on 07/06/2024.  This all was fine..  She says the next year she will have a bone density test.  She has had no issues with COVID.  There has been no headache.  She has had no fever.  Overall, I would have to say that her performance status is probably ECOG 1.   Medications:  Current Outpatient Medications:    amoxicillin  (AMOXIL ) 500 MG capsule, Take 2,000 mg by mouth See admin instructions. Dental procedures. Takes 2000mg  one hour prior to dental procedures., Disp: , Rfl:    ascorbic acid (VITAMIN C) 500 MG tablet, Take 500 mg by mouth daily., Disp: , Rfl:    Blood Glucose Monitoring Suppl DEVI, 1 each by Does not apply route as directed. Dispense based on patient and insurance preference. Use up to four times daily as directed. (FOR ICD-10 E10.9, E11.9)., Disp: 1 each, Rfl: 0   Cholecalciferol (VITAMIN D ) 50 MCG (2000 UT) tablet, Take 2,000 Units by mouth daily., Disp: , Rfl:    COLLAGEN PO, Take 1 Scoop by mouth every other day. With Probiotic, Disp: , Rfl:    ezetimibe  (ZETIA ) 10 MG tablet, TAKE 1 TABLET BY MOUTH DAILY, Disp: 90 tablet, Rfl: 3   fexofenadine  (ALLEGRA  ALLERGY) 60 MG tablet, Take 1 tablet (60 mg  total) by mouth 2 (two) times daily., Disp: 180 tablet, Rfl: 1   folic acid  (FOLVITE ) 800 MCG tablet, Take 800 mcg by mouth daily., Disp: , Rfl:    Glucose Blood (BLOOD GLUCOSE TEST STRIPS) STRP, 1 each by Does not apply route as directed. Dispense based on patient and insurance preference. Use up to four times daily as directed. (FOR ICD-10 E10.9, E11.9)., Disp: 100 strip, Rfl: 0   Lancet Device MISC, 1 each by Does not apply route as directed. Dispense based on patient and insurance preference. Use up to four times daily as directed. (FOR ICD-10 E10.9, E11.9)., Disp: 1 each, Rfl: 0   Lancets (ONETOUCH DELICA PLUS LANCET33G) MISC, USE WITH STRIPS, Disp: 100 each, Rfl: 3   Lancets MISC, 1 each by Does not apply route as directed. Dispense based on patient and insurance preference. Use up to four times daily as directed. (FOR ICD-10 E10.9, E11.9)., Disp: 100 each, Rfl: 0   levothyroxine  (SYNTHROID ) 88 MCG tablet, TAKE 1 TABLET EVERY DAY ON EMPTY STOMACHWITH A GLASS OF WATER  AT LEAST 30-60 MINBEFORE BREAKFAST, Disp: 90 tablet, Rfl: 1   MAGNESIUM GLUCONATE PO, Take 2 capsules by mouth at bedtime., Disp: , Rfl:    metroNIDAZOLE (METROCREAM) 0.75 % cream, Apply 1 Application topically 2 (two) times  daily., Disp: , Rfl:    ONETOUCH VERIO test strip, USE TO CHECK BLOOD SUGARS TWICE DAILY, Disp: 100 each, Rfl: 12   OVER THE COUNTER MEDICATION, Bone Broth powder - alternates with collagen., Disp: , Rfl:    Propylene Glycol (SYSTANE BALANCE) 0.6 % SOLN, Place 1 drop into both eyes daily as needed (dry eyes)., Disp: , Rfl:    Turmeric (QC TUMERIC COMPLEX) 500 MG CAPS, Take by mouth., Disp: , Rfl:    vitamin B-12 (CYANOCOBALAMIN ) 1000 MCG tablet, Take 1,000 mcg by mouth daily., Disp: , Rfl:   Allergies:  Allergies  Allergen Reactions   Codeine Shortness Of Breath and Other (See Comments)    Altered mental staus   Ilevro [Nepafenac] Other (See Comments)    Eye swelling Eye drops    Other Swelling    DOGS.   Congestion. FEATHERS.  Congestion  Can not take preservative free eye drops     Maxitrol [Neomycin-Polymyxin-Dexameth]     dry eyes    Metformin  And Related     Shakiness    Rosuvastatin  Nausea Only    And constipation    Augmentin  [Amoxicillin -Pot Clavulanate] Itching and Rash   Molds & Smuts Other (See Comments)    Congestion.   Neomycin Rash   Neomycin-Bacitracin Zn-Polymyx Rash   Neomycin-Polymyxin-Hc Rash   Tape Dermatitis    Blisters   Tapentadol Nausea And Vomiting    Nucynta   Terbinafine  Rash    Trouble swallowing   Tramadol Other (See Comments), Nausea Only and Nausea And Vomiting    dizziness    Past Medical History, Surgical history, Social history, and Family History were reviewed and updated.  Review of Systems: Review of Systems  Constitutional: Negative.   HENT: Negative.    Eyes: Negative.   Respiratory: Negative.    Cardiovascular: Negative.   Gastrointestinal: Negative.   Genitourinary: Negative.   Musculoskeletal: Negative.   Skin: Negative.   Neurological: Negative.   Endo/Heme/Allergies: Negative.   Psychiatric/Behavioral: Negative.       Physical Exam:  height is 5' 5 (1.651 m) and weight is 165 lb (74.8 kg). Her oral temperature is 97.7 F (36.5 C). Her blood pressure is 111/66 and her pulse is 87. Her respiration is 17 and oxygen saturation is 98%.   Physical Exam Vitals reviewed.  HENT:     Head: Normocephalic and atraumatic.  Eyes:     Pupils: Pupils are equal, round, and reactive to light.  Cardiovascular:     Rate and Rhythm: Normal rate and regular rhythm.     Heart sounds: Normal heart sounds.  Pulmonary:     Effort: Pulmonary effort is normal.     Breath sounds: Normal breath sounds.  Abdominal:     General: Bowel sounds are normal.     Palpations: Abdomen is soft.  Musculoskeletal:        General: No tenderness or deformity. Normal range of motion.     Cervical back: Normal range of motion.  Lymphadenopathy:      Cervical: No cervical adenopathy.  Skin:    General: Skin is warm and dry.     Findings: No erythema or rash.  Neurological:     Mental Status: She is alert and oriented to person, place, and time.  Psychiatric:        Behavior: Behavior normal.        Thought Content: Thought content normal.        Judgment: Judgment normal.    Lab Results  Component Value Date   WBC 8.4 09/08/2024   HGB 13.9 09/08/2024   HCT 40.9 09/08/2024   MCV 89.3 09/08/2024   PLT 152 09/08/2024     Chemistry      Component Value Date/Time   NA 138 09/08/2024 1530   NA 141 02/11/2024 1442   NA 135 04/29/2017 1157   NA 138 10/29/2016 1021   K 4.4 09/08/2024 1530   K 3.7 04/29/2017 1157   K 4.2 10/29/2016 1021   CL 103 09/08/2024 1530   CL 102 04/29/2017 1157   CO2 23 09/08/2024 1530   CO2 27 04/29/2017 1157   CO2 24 10/29/2016 1021   BUN 30 (H) 09/08/2024 1530   BUN 27 02/11/2024 1442   BUN 9 04/29/2017 1157   BUN 17.3 10/29/2016 1021   CREATININE 0.96 09/08/2024 1530   CREATININE 0.9 04/29/2017 1157   CREATININE 0.9 10/29/2016 1021      Component Value Date/Time   CALCIUM  10.0 09/08/2024 1530   CALCIUM  9.2 04/29/2017 1157   CALCIUM  10.0 10/29/2016 1021   ALKPHOS 64 09/08/2024 1530   ALKPHOS 64 04/29/2017 1157   ALKPHOS 71 10/29/2016 1021   AST 30 09/08/2024 1530   AST 24 10/29/2016 1021   ALT 37 09/08/2024 1530   ALT 37 04/29/2017 1157   ALT 26 10/29/2016 1021   BILITOT 0.7 09/08/2024 1530   BILITOT 1.18 10/29/2016 1021       Impression and Plan: Ms. Goya is 83 year old white female with history of synchronous bilateral breast cancer. Fortunately both breast cancers were node negative. They were ER positive and HER-2 negative.   It has now been  21 years since she was treated for the breast cancer.  I really have to believe that she is going to be cured since her cancers were node negative.  I did see that her biggest problem will be the diabetes.  She would does not want to  be put on any diabetic medicine.  She said that she cannot take medicine for diabetes.  Hopefully this will not be the case.  As always, we will see her back in 6 more months.     Maude JONELLE Crease, MD 12/12/20255:01 PM

## 2024-09-09 LAB — CANCER ANTIGEN 27.29: CA 27.29: 21.9 U/mL (ref 0.0–38.6)

## 2024-09-16 ENCOUNTER — Encounter: Payer: Self-pay | Admitting: Internal Medicine

## 2024-09-20 NOTE — Telephone Encounter (Signed)
 Noted

## 2024-11-21 ENCOUNTER — Ambulatory Visit: Admitting: Internal Medicine

## 2024-12-11 ENCOUNTER — Other Ambulatory Visit

## 2024-12-13 ENCOUNTER — Ambulatory Visit: Admitting: Internal Medicine

## 2025-03-09 ENCOUNTER — Inpatient Hospital Stay

## 2025-03-09 ENCOUNTER — Inpatient Hospital Stay: Admitting: Hematology & Oncology

## 2025-07-23 ENCOUNTER — Ambulatory Visit
